# Patient Record
Sex: Female | Born: 1960 | Race: Black or African American | Hispanic: No | Marital: Single | Smoking: Never smoker
Health system: Southern US, Community
[De-identification: ages and names within clinical notes are randomized; demographics above are authoritative.]

## PROBLEM LIST (undated history)

## (undated) ENCOUNTER — Emergency Department (HOSPITAL_COMMUNITY): Payer: 59

## (undated) ENCOUNTER — Emergency Department (HOSPITAL_COMMUNITY): Payer: Self-pay | Source: Home / Self Care

## (undated) DIAGNOSIS — T7840XA Allergy, unspecified, initial encounter: Secondary | ICD-10-CM

## (undated) DIAGNOSIS — I10 Essential (primary) hypertension: Secondary | ICD-10-CM

## (undated) DIAGNOSIS — E785 Hyperlipidemia, unspecified: Secondary | ICD-10-CM

## (undated) DIAGNOSIS — E119 Type 2 diabetes mellitus without complications: Secondary | ICD-10-CM

## (undated) DIAGNOSIS — Z862 Personal history of diseases of the blood and blood-forming organs and certain disorders involving the immune mechanism: Secondary | ICD-10-CM

## (undated) DIAGNOSIS — K219 Gastro-esophageal reflux disease without esophagitis: Secondary | ICD-10-CM

## (undated) DIAGNOSIS — N189 Chronic kidney disease, unspecified: Secondary | ICD-10-CM

## (undated) DIAGNOSIS — D631 Anemia in chronic kidney disease: Secondary | ICD-10-CM

## (undated) HISTORY — DX: Anemia in chronic kidney disease: N18.9

## (undated) HISTORY — DX: Personal history of diseases of the blood and blood-forming organs and certain disorders involving the immune mechanism: Z86.2

## (undated) HISTORY — DX: Allergy, unspecified, initial encounter: T78.40XA

## (undated) HISTORY — DX: Anemia in chronic kidney disease: D63.1

## (undated) HISTORY — DX: Hyperlipidemia, unspecified: E78.5

## (undated) HISTORY — DX: Essential (primary) hypertension: I10

## (undated) HISTORY — DX: Type 2 diabetes mellitus without complications: E11.9

---

## 2000-08-25 ENCOUNTER — Inpatient Hospital Stay (HOSPITAL_COMMUNITY): Admission: AD | Admit: 2000-08-25 | Discharge: 2000-08-25 | Payer: Self-pay | Admitting: *Deleted

## 2000-09-04 ENCOUNTER — Other Ambulatory Visit: Admission: RE | Admit: 2000-09-04 | Discharge: 2000-09-04 | Payer: Self-pay | Admitting: Obstetrics and Gynecology

## 2000-09-04 ENCOUNTER — Ambulatory Visit (HOSPITAL_COMMUNITY): Admission: RE | Admit: 2000-09-04 | Discharge: 2000-09-04 | Payer: Self-pay | Admitting: Obstetrics and Gynecology

## 2000-09-04 ENCOUNTER — Encounter: Payer: Self-pay | Admitting: Obstetrics and Gynecology

## 2000-10-20 ENCOUNTER — Inpatient Hospital Stay (HOSPITAL_COMMUNITY): Admission: AD | Admit: 2000-10-20 | Discharge: 2000-10-20 | Payer: Self-pay | Admitting: Obstetrics and Gynecology

## 2000-11-09 ENCOUNTER — Ambulatory Visit (HOSPITAL_COMMUNITY): Admission: RE | Admit: 2000-11-09 | Discharge: 2000-11-09 | Payer: Self-pay | Admitting: Obstetrics and Gynecology

## 2000-11-09 ENCOUNTER — Encounter: Payer: Self-pay | Admitting: Obstetrics and Gynecology

## 2000-11-30 ENCOUNTER — Inpatient Hospital Stay (HOSPITAL_COMMUNITY): Admission: RE | Admit: 2000-11-30 | Discharge: 2000-12-03 | Payer: Self-pay | Admitting: Obstetrics and Gynecology

## 2000-11-30 ENCOUNTER — Encounter: Payer: Self-pay | Admitting: Obstetrics and Gynecology

## 2001-09-06 ENCOUNTER — Other Ambulatory Visit: Admission: RE | Admit: 2001-09-06 | Discharge: 2001-09-06 | Payer: Self-pay | Admitting: Obstetrics and Gynecology

## 2003-04-17 ENCOUNTER — Other Ambulatory Visit: Admission: RE | Admit: 2003-04-17 | Discharge: 2003-04-17 | Payer: Self-pay | Admitting: Obstetrics and Gynecology

## 2003-04-17 ENCOUNTER — Ambulatory Visit (HOSPITAL_COMMUNITY): Admission: RE | Admit: 2003-04-17 | Discharge: 2003-04-17 | Payer: Self-pay | Admitting: Obstetrics and Gynecology

## 2003-04-17 ENCOUNTER — Encounter: Payer: Self-pay | Admitting: Obstetrics and Gynecology

## 2014-07-31 ENCOUNTER — Ambulatory Visit (INDEPENDENT_AMBULATORY_CARE_PROVIDER_SITE_OTHER): Payer: Managed Care, Other (non HMO)

## 2014-07-31 ENCOUNTER — Encounter: Payer: Self-pay | Admitting: Family Medicine

## 2014-07-31 ENCOUNTER — Ambulatory Visit (INDEPENDENT_AMBULATORY_CARE_PROVIDER_SITE_OTHER): Payer: Managed Care, Other (non HMO) | Admitting: Family Medicine

## 2014-07-31 VITALS — BP 160/90 | HR 84 | Temp 98.1°F | Resp 18 | Ht 69.5 in | Wt 363.2 lb

## 2014-07-31 DIAGNOSIS — E119 Type 2 diabetes mellitus without complications: Secondary | ICD-10-CM

## 2014-07-31 DIAGNOSIS — S8002XA Contusion of left knee, initial encounter: Secondary | ICD-10-CM

## 2014-07-31 DIAGNOSIS — E669 Obesity, unspecified: Secondary | ICD-10-CM

## 2014-07-31 DIAGNOSIS — I10 Essential (primary) hypertension: Secondary | ICD-10-CM | POA: Insufficient documentation

## 2014-07-31 DIAGNOSIS — S8001XA Contusion of right knee, initial encounter: Secondary | ICD-10-CM

## 2014-07-31 DIAGNOSIS — S40021A Contusion of right upper arm, initial encounter: Secondary | ICD-10-CM

## 2014-07-31 DIAGNOSIS — E1169 Type 2 diabetes mellitus with other specified complication: Secondary | ICD-10-CM | POA: Insufficient documentation

## 2014-07-31 MED ORDER — TRAMADOL HCL 50 MG PO TABS: 50.0000 mg | ORAL_TABLET | Freq: Three times a day (TID) | ORAL | Status: DC | PRN

## 2014-07-31 NOTE — Progress Notes (Signed)
[facility] [address] [place] [address] [phone]  Date:  07/31/2014   Name:  Erin Davila   DOB:  1961/03/12   MRN:  [id number]  PCP:  No primary care provider on file.    Chief Complaint: Fall   History of Present Illness:  Erin Davila is a 54 y.o. very pleasant female patient who presents with the following:  Here today as a new patient- she fell on some steps this am in the icy weather.  She fell down onto her knees, and hit her right forearm she thinks on a railing.  She also "grazed' her head on the side of the house.  She fell down just one step. No LOC, she does not have a headache. Her main issue is knee pain- right more than left.    She has a history of DM and HTN.  Her PCP is Dr. Cleda Daub.   She took 800 mg of ibuprofen- this helped her just somewhat  There are no active problems to display for this patient.   Past Medical History  Diagnosis Date  . Allergy   . Diabetes mellitus without complication   . Hypertension     Past Surgical History  Procedure Laterality Date  . Cesarean section      History  Substance Use Topics  . Smoking status: Never Smoker   . Smokeless tobacco: Not on file  . Alcohol Use: No    History reviewed. No pertinent family history.  Allergies  Allergen Reactions  . Milk-Related Compounds     Lactose    Medication list has been reviewed and updated.  No current outpatient prescriptions on file prior to visit.   No current facility-administered medications on file prior to visit.    Review of Systems:  As per HPI- otherwise negative.   Physical Examination: Filed Vitals:   07/31/14 1256  BP: 160/90  Pulse: 84  Temp: 98.1 F (36.7 C)  Resp: 18   Filed Vitals:   07/31/14 1256  Height: 5' 9.5" (1.765 m)  Weight: 363 lb 3.2 oz (164.746 kg)   Body mass index is 52.88 kg/(m^2). Ideal Body Weight: Weight in (lb) to have BMI = 25: 171.4  GEN: WDWN, NAD, Non-toxic, A &  O x , morbidly obese lady There is no evidence of trauma to her head- no hematoma, no abrasion, no swelling, no tenderness She denies any headache HEENT: Atraumatic, Normocephalic. Neck supple. No masses, No LAD. Ears and Nose: No external deformity. CV: RRR, No M/G/R. No JVD. No thrill. No extra heart sounds. PULM: CTA B, no wheezes, crackles, rhonchi. No retractions. No resp. distress. No accessory muscle use.Marland Kitchen EXTR: No c/c/e NEURO walks slowly and favors right minimally  PSYCH: Normally interactive. Conversant. Not depressed or anxious appearing.  Calm demeanor.  Mild tenderness over dorsal right forearm proximal to wrist Right knee: mild tenderness with ROM, no effusion, no swelling, no heat.  Cannot localize tenderness on exam Left knee: minimal tenderness, no effusion, normal ROM  [facility] reading (PRIMARY) by  Dr. Patsy Lager. Right forearm: negative Right knee:negative except for degenerative change  Left knee: negative except for degenerative change  CLINICAL DATA: Status post fall this morning on icy steps. Right forearm pain. Initial encounter.  EXAM: RIGHT FOREARM - 2 VIEW  COMPARISON: None.  FINDINGS: Imaged bones, joints and soft tissues appear normal.  IMPRESSION: Negative exam.  LEFT KNEE - COMPLETE 4+ VIEW  COMPARISON: None.  FINDINGS:  Tricompartment degenerative change. No fracture or dislocation. No acute abnormality.  IMPRESSION: Tricompartment degenerative change. No acute abnormality.  RIGHT KNEE - COMPLETE 4+ VIEW  COMPARISON: None  FINDINGS: The bones of the right knee are adequately mineralized. There is beaking of the tibial spines. The tibial plateaus are intact. The proximal fibula is unremarkable. 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 There are osteophytes from the articular margins of the patella and the lateral  femoral condyles.  IMPRESSION: There are degenerative changes of the right knee centered on the patellofemoral joint. There is no acute fracture.   Assessment and Plan: Knee contusion, right, initial encounter - Plan: DG Knee Complete 4 Views Right, traMADol (ULTRAM) 50 MG tablet  Contusion of right arm, initial encounter - Plan: DG Forearm Right, traMADol (ULTRAM) 50 MG tablet  Knee contusion, left, initial encounter - Plan: DG Knee Complete 4 Views Left, traMADol (ULTRAM) 50 MG tablet  Diabetes mellitus type 2 in obese  Essential hypertension  Morbid obesity  Here today after a fall with injury to her right wrist, and both knees.  No apparent fracture or other more serious injury.  Her weight likley contributes to chronic degeneraive changes of her knees.  Encouraged ice, elevation and ace wrap for support of her knee as needed   She will follow-up if not feeling better soon.  Tramadol as needed for more severe pain.  Given crutches to use as needed   Signed Abbe AmsterdamJessica Copland, MD

## 2014-07-31 NOTE — Patient Instructions (Signed)
Elevated and ice your knee, and use your crutches as needed.  You can also try a walker or cane if easier for you Use the tramadol as needed for pain but remember it can make you sleepy- do not use when you need to drive Let me know if you do not feel better in the next few days- Sooner if worse.

## 2014-09-14 ENCOUNTER — Other Ambulatory Visit: Payer: Self-pay | Admitting: Family Medicine

## 2014-09-14 ENCOUNTER — Ambulatory Visit (INDEPENDENT_AMBULATORY_CARE_PROVIDER_SITE_OTHER): Payer: Managed Care, Other (non HMO)

## 2014-09-14 ENCOUNTER — Ambulatory Visit (INDEPENDENT_AMBULATORY_CARE_PROVIDER_SITE_OTHER): Payer: Managed Care, Other (non HMO) | Admitting: Family Medicine

## 2014-09-14 VITALS — BP 142/84 | HR 90 | Temp 97.9°F | Resp 18 | Ht 69.0 in | Wt 356.0 lb

## 2014-09-14 DIAGNOSIS — M25551 Pain in right hip: Secondary | ICD-10-CM

## 2014-09-14 DIAGNOSIS — M549 Dorsalgia, unspecified: Secondary | ICD-10-CM | POA: Diagnosis not present

## 2014-09-14 DIAGNOSIS — M25552 Pain in left hip: Secondary | ICD-10-CM | POA: Diagnosis not present

## 2014-09-14 DIAGNOSIS — M7062 Trochanteric bursitis, left hip: Secondary | ICD-10-CM

## 2014-09-14 DIAGNOSIS — M7061 Trochanteric bursitis, right hip: Secondary | ICD-10-CM | POA: Diagnosis not present

## 2014-09-14 MED ORDER — DICLOFENAC SODIUM 75 MG PO TBEC: 75.0000 mg | DELAYED_RELEASE_TABLET | Freq: Two times a day (BID) | ORAL | Status: DC

## 2014-09-14 NOTE — Patient Instructions (Signed)
Return Monday evening or Friday of next week if you have been helped by the injection.  Apply ice for about 10-15 minutes a couple of times tonight.  Try and resume the best exercise that you can  Your commended on weight loss, and urged to continue keeping that is a top part to focus  Take diclofenac one twice daily for pain and inflammation. Take with food.

## 2014-09-14 NOTE — Progress Notes (Signed)
Subjective: Patient is here for follow-up with regard to her fall that she had about 5-6 weeks ago. She was evaluated her for her injury to her hips and hands. She did not have her hips x-ray that time, but they've continued to hurt her. The low back hurts some. She has some pains in the legs. She is diabetic  Objective: Morbidly obese lady. She is tender to palpation deep in both hips. Both greater trochanteric areas are tender. Impossible her through a lot of range of motion.  [facility] reading (PRIMARY) by  Dr. Alwyn RenHopper No obvious bony injury. Minimal arthritic changes of the hips and spine..  Assessment: Bilateral hip pain Bilateral greater trochanteric bursal pain Low back pain Diabetes  Plan:  Told the patient this this may just take time to resolve. I can try and inject the greater trochanteric bursa, however she is a very big lady and not easy to hit the target. She is also diabetic but her sugars have been well controlled.. She would like to go ahead. I told her I will just do one today and have her come back to the other one if necessary.  Procedure note Using sterile technique with the patient laying on left side the right greater trochanteric bursa area of maximum pain was isolated. It was prepped and ethyl chloride spray used for anesthesia. The bursa was injected without difficulty. Patient tolerated the procedure well. Was instructed to use some ice on it. If that helps, we will try the other side. If it doesn't help, she will just have to give the pain more time.  Urged her to try and get regular exercise  Try diclofenac 75 mg twice daily  Return as needed or return next week for reinjection of the other side.

## 2014-09-28 ENCOUNTER — Ambulatory Visit (INDEPENDENT_AMBULATORY_CARE_PROVIDER_SITE_OTHER): Payer: Managed Care, Other (non HMO) | Admitting: Family Medicine

## 2014-09-28 VITALS — BP 124/86 | HR 82 | Temp 98.3°F | Resp 16 | Ht 67.5 in | Wt 352.6 lb

## 2014-09-28 DIAGNOSIS — M25551 Pain in right hip: Secondary | ICD-10-CM | POA: Diagnosis not present

## 2014-09-28 DIAGNOSIS — M549 Dorsalgia, unspecified: Secondary | ICD-10-CM | POA: Diagnosis not present

## 2014-09-28 DIAGNOSIS — M7062 Trochanteric bursitis, left hip: Secondary | ICD-10-CM

## 2014-09-28 DIAGNOSIS — M25552 Pain in left hip: Secondary | ICD-10-CM

## 2014-09-28 MED ORDER — DICLOFENAC SODIUM 75 MG PO TBEC: 75.0000 mg | DELAYED_RELEASE_TABLET | Freq: Two times a day (BID) | ORAL | Status: DC

## 2014-09-28 MED ORDER — METHYLPREDNISOLONE ACETATE 80 MG/ML IJ SUSP
80.0000 mg | Freq: Once | INTRAMUSCULAR | Status: AC
  Administered 2014-09-28: 80 mg via INTRA_ARTICULAR

## 2014-09-28 NOTE — Progress Notes (Signed)
Subjective: Patient is feeling better. Her back is feeling better. The left hip is considerably better. She shifted weight after the injection and using the right hip were for a few days and it has continued to hurt, though it probably has improved a little bit by now. The diclofenac has helped her considerably. I told her we cannot keep on that long-term. She would like me to go ahead andinject the right hip.I note that I failed to put down the milligrams of the Depo-Medrol used in the other hip. It was 40 mg of Depo-Medrol with 1 mL of 2% lidocaine.  Objective: Very tender over the greater trochanteric bursa  Assessment: Greater trochanter bursitis on right Back pain improved Left hip improved  Plan: We will inject the right hip for her  Procedure note: Using sterile technique the area was prepped. Skin was anesthetized with ethyl chloride spray. Using a 1-1/2 inch needle, which had to be pressed into the bevel, 40 mg (1/2 mL of 80 mg per mL Depo-Medrol) Depo-Medrol was injected into the area of most pain at the greater trochanteric bursa. Patient tolerated the injection well.  See instructions. Return as needed. Patient was very pleased with previous results and gland were injected her today.

## 2014-09-28 NOTE — Addendum Note (Signed)
Addended byLevon Hedger: CLINE, REBEKAH A on: 09/28/2014 03:52 PM   Modules accepted: Orders

## 2014-09-28 NOTE — Patient Instructions (Signed)
Take the anti-inflammatory, diclofenac, one twice daily for 1 more week. Then just save them and see how you do on an as-needed basis. I cannot continue this medicine long term with you since you are diabetic.  Return on an as-needed basis

## 2014-11-23 ENCOUNTER — Other Ambulatory Visit: Payer: Self-pay | Admitting: Family Medicine

## 2015-02-01 ENCOUNTER — Ambulatory Visit (INDEPENDENT_AMBULATORY_CARE_PROVIDER_SITE_OTHER): Payer: Managed Care, Other (non HMO) | Admitting: Family Medicine

## 2015-02-01 VITALS — BP 114/80 | HR 86 | Temp 98.0°F | Resp 16 | Ht 69.0 in | Wt 355.0 lb

## 2015-02-01 DIAGNOSIS — M7062 Trochanteric bursitis, left hip: Secondary | ICD-10-CM | POA: Diagnosis not present

## 2015-02-01 DIAGNOSIS — E669 Obesity, unspecified: Secondary | ICD-10-CM

## 2015-02-01 DIAGNOSIS — M25559 Pain in unspecified hip: Secondary | ICD-10-CM | POA: Diagnosis not present

## 2015-02-01 DIAGNOSIS — E1169 Type 2 diabetes mellitus with other specified complication: Secondary | ICD-10-CM

## 2015-02-01 DIAGNOSIS — E119 Type 2 diabetes mellitus without complications: Secondary | ICD-10-CM | POA: Diagnosis not present

## 2015-02-01 DIAGNOSIS — M7061 Trochanteric bursitis, right hip: Secondary | ICD-10-CM

## 2015-02-01 LAB — GLUCOSE, POCT (MANUAL RESULT ENTRY): POC GLUCOSE: 82 mg/dL (ref 70–99)

## 2015-02-01 LAB — POCT GLYCOSYLATED HEMOGLOBIN (HGB A1C): Hemoglobin A1C: 6.1

## 2015-02-01 MED ORDER — METHYLPREDNISOLONE ACETATE 80 MG/ML IJ SUSP: 40.0000 mg | Freq: Once | INTRAMUSCULAR | Status: DC

## 2015-02-01 MED ORDER — DICLOFENAC SODIUM 75 MG PO TBEC: DELAYED_RELEASE_TABLET | ORAL | Status: DC

## 2015-02-01 NOTE — Progress Notes (Signed)
Subjective:    Patient ID: Erin Davila, female    DOB: [birth year], 54 y.o.   MRN: [id number]  Chief Complaint  Patient presents with  . corizone shot    both hips  . Medication Refill    Diclofenac Sodium   Patient Active Problem List   Diagnosis Date Noted  . Trochanteric bursitis of both hips 02/01/2015  . Diabetes mellitus type 2 in obese 07/31/2014  . HTN (hypertension) 07/31/2014  . Morbid obesity 07/31/2014   Prior to Admission medications   Medication Sig Start Date End Date Taking? Authorizing Provider  diclofenac (VOLTAREN) 75 MG EC tablet TAKE 1 TABLET (75 MG TOTAL) BY MOUTH 2 (TWO) TIMES DAILY. 11/28/14  Yes Peyton Najjar, MD  insulin detemir (LEVEMIR) 100 UNIT/ML injection Inject into the skin at bedtime.   Yes Historical Provider, MD  METFORMIN HCL PO Take by mouth.   Yes Historical Provider, MD  olmesartan-hydrochlorothiazide (BENICAR HCT) 40-25 MG per tablet Take 1 tablet by mouth daily.   Yes Historical Provider, MD  SIMVASTATIN PO Take by mouth.   Yes Historical Provider, MD  IBUPROFEN PO Take by mouth.    Historical Provider, MD  meloxicam (MOBIC) 15 MG tablet Take 15 mg by mouth daily.    Historical Provider, MD  montelukast (SINGULAIR) 10 MG tablet Take 10 mg by mouth at bedtime.    Historical Provider, MD  traMADol (ULTRAM) 50 MG tablet Take 1 tablet (50 mg total) by mouth every 8 (eight) hours as needed. Patient not taking: Reported on 09/14/2014 07/31/14   [name], MD   Medications, allergies, past medical history, surgical history, family history, social history and problem list reviewed and updated.  HPI  19 yof presents with bilateral hip pain wanting repeat hip injections.   Diagnosed with bilateral trochanteric bursitis months ago. Received steroid injection in left hip March 10th and right hip March 24th. This worked wonders to relieve her pain for several months. Starting few wks ago pain has returned bilateral hips with activity, even  sometimes at rest. Has never seen an ortho doc for this.   Not exercising. Active every day with chores/work.   Has dm2. Unsure of recent bg readings or a1c.   Review of Systems     Objective:   Physical Exam  Constitutional: She is oriented to person, place, and time. She appears well-developed and well-nourished.  Non-toxic appearance. She does not have a sickly appearance. She does not appear ill. No distress.  BP 114/80 mmHg  Pulse 86  Temp(Src) 98 F (36.7 C) (Oral)  Resp 16  Ht 5\' 9"  (1.753 m)  Wt 355 lb (161.027 kg)  BMI 52.40 kg/m2  SpO2 99%  LMP 11/03/2014   Musculoskeletal:       Right hip: She exhibits decreased range of motion.       Left hip: She exhibits decreased range of motion.  Neurological: She is alert and oriented to person, place, and time.  Psychiatric: She has a normal mood and affect. Her speech is normal and behavior is normal.   Results for orders placed or performed in visit on 02/01/15  POCT glucose (manual entry)  Result Value Ref Range   POC Glucose 82 70 - 99 mg/dl  POCT glycosylated hemoglobin (Hb A1C)  Result Value Ref Range   Hemoglobin A1C 6.1       Assessment & Plan:   Trochanteric bursitis of both hips --  Diabetes mellitus type 2 in obese -  Plan: POCT glucose (manual entry), POCT glycosylated hemoglobin (Hb A1C) --well controlled on current regimen  Donnajean Lopes, PA-C Physician Assistant-Certified [facility]  02/01/2015 3:18 PM

## 2015-02-01 NOTE — Progress Notes (Signed)
Bilateral trochanteric bursa injection with 1cc 2% plain lido with 40 ml (0.5cc) depomedrol in each hip. Areas palpated and marked. Areas prepped with betadine. Injections made without complication. Areas covered with bandaid. Pt tolerated procedure well.

## 2015-02-01 NOTE — Progress Notes (Signed)
She was seen and examined by me. We did discuss her diabetes in the setting of bilateral trochanteric bursitis and steroid injections. Monitor her sugar level and will call me in the morning if it is severely elevated. She is currently on metformin twice a day and also Lantus 50 units at night.

## 2015-02-01 NOTE — Patient Instructions (Signed)
Hip Bursitis Bursitis is a swelling and soreness (inflammation) of a fluid-filled sac (bursa). This sac overlies and protects the joints.  CAUSES   Injury.  Overuse of the muscles surrounding the joint.  Arthritis.  Gout.  Infection.  Cold weather.  Inadequate warm-up and conditioning prior to activities. The cause may not be known.  SYMPTOMS   Mild to severe irritation.  Tenderness and swelling over the outside of the hip.  Pain with motion of the hip.  If the bursa becomes infected, a fever may be present. Redness, tenderness, and warmth will develop over the hip. Symptoms usually lessen in 3 to 4 weeks with treatment, but can come back. TREATMENT If conservative treatment does not work, your caregiver may advise draining the bursa and injecting cortisone into the area. This may speed up the healing process. This may also be used as an initial treatment of choice. HOME CARE INSTRUCTIONS   Apply ice to the affected area for 15-20 minutes every 3 to 4 hours while awake for the first 2 days. Put the ice in a plastic bag and place a towel between the bag of ice and your skin.  Rest the painful joint as much as possible, but continue to put the joint through a normal range of motion at least 4 times per day. When the pain lessens, begin normal, slow movements and usual activities to help prevent stiffness of the hip.  Only take over-the-counter or prescription medicines for pain, discomfort, or fever as directed by your caregiver.  Use crutches to limit weight bearing on the hip joint, if advised.  Elevate your painful hip to reduce swelling. Use pillows for propping and cushioning your legs and hips.  Gentle massage may provide comfort and decrease swelling. SEEK IMMEDIATE MEDICAL CARE IF:   Your pain increases even during treatment, or you are not improving.  You have a fever.  You have heat and inflammation over the involved bursa.  You have any other questions or  concerns. MAKE SURE YOU:   Understand these instructions.  Will watch your condition.  Will get help right away if you are not doing well or get worse. Document Released: 12/13/2001 Document Revised: 09/15/2011 Document Reviewed: 07/12/2008 [facility] Patient Information 2015 [place], [place]. This information is not intended to replace advice given to you by your health care provider. Make sure you discuss any questions you have with your health care provider. Diabetes and Standards of Medical Care Diabetes is complicated. You may find that your diabetes team includes a dietitian, nurse, diabetes educator, eye doctor, and more. To help everyone know what is going on and to help you get the care you deserve, the following schedule of care was developed to help keep you on track. Below are the tests, exams, vaccines, medicines, education, and plans you will need. HbA1c test This test shows how well you have controlled your glucose over the past 2-3 months. It is used to see if your diabetes management plan needs to be adjusted.   It is performed at least 2 times a year if you are meeting treatment goals.  It is performed 4 times a year if therapy has changed or if you are not meeting treatment goals. Blood pressure test  This test is performed at every routine medical visit. The goal is less than 140/90 mm Hg for most people, but 130/80 mm Hg in some cases. Ask your health care provider about your goal. Dental exam  Follow up with the dentist regularly.  Eye exam  If you are diagnosed with type 1 diabetes as a child, get an exam upon reaching the age of 107 years or older and have had diabetes for 3-5 years. Yearly eye exams are recommended after that initial eye exam.  If you are diagnosed with type 1 diabetes as an adult, get an exam within 5 years of diagnosis and then yearly.  If you are diagnosed with type 2 diabetes, get an exam as soon as possible after the diagnosis and then  yearly. Foot care exam  Visual foot exams are performed at every routine medical visit. The exams check for cuts, injuries, or other problems with the feet.  A comprehensive foot exam should be done yearly. This includes visual inspection as well as assessing foot pulses and testing for loss of sensation.  Check your feet nightly for cuts, injuries, or other problems with your feet. Tell your health care provider if anything is not healing. Kidney function test (urine microalbumin)  This test is performed once a year.  Type 1 diabetes: The first test is performed 5 years after diagnosis.  Type 2 diabetes: The first test is performed at the time of diagnosis.  A serum creatinine and estimated glomerular filtration rate (eGFR) test is done once a year to assess the level of chronic kidney disease (CKD), if present. Lipid profile (cholesterol, HDL, LDL, triglycerides)  Performed every 5 years for most people.  The goal for LDL is less than 100 mg/dL. If you are at high risk, the goal is less than 70 mg/dL.  The goal for HDL is 40 mg/dL-50 mg/dL for men and 50 mg/dL-60 mg/dL for women. An HDL cholesterol of 60 mg/dL or higher gives some protection against heart disease.  The goal for triglycerides is less than 150 mg/dL. Influenza vaccine, pneumococcal vaccine, and hepatitis B vaccine  The influenza vaccine is recommended yearly.  It is recommended that people with diabetes who are over 81 years old get the pneumonia vaccine. In some cases, two separate shots may be given. Ask your health care provider if your pneumonia vaccination is up to date.  The hepatitis B vaccine is also recommended for adults with diabetes. Diabetes self-management education  Education is recommended at diagnosis and ongoing as needed. Treatment plan  Your treatment plan is reviewed at every medical visit. Document Released: 04/20/2009 Document Revised: 11/07/2013 Document Reviewed: 11/23/2012 [employer] [facility] [employer]  Patient Information 2015 [place], [place]. This information is not intended to replace advice given to you by your health care provider. Make sure you discuss any questions you have with your health care provider.

## 2015-03-27 ENCOUNTER — Ambulatory Visit (INDEPENDENT_AMBULATORY_CARE_PROVIDER_SITE_OTHER): Payer: Managed Care, Other (non HMO) | Admitting: Family Medicine

## 2015-03-27 ENCOUNTER — Ambulatory Visit (INDEPENDENT_AMBULATORY_CARE_PROVIDER_SITE_OTHER): Payer: Managed Care, Other (non HMO)

## 2015-03-27 VITALS — BP 132/96 | HR 87 | Temp 98.0°F | Resp 16 | Ht 68.5 in | Wt 350.0 lb

## 2015-03-27 DIAGNOSIS — Y92099 Unspecified place in other non-institutional residence as the place of occurrence of the external cause: Secondary | ICD-10-CM

## 2015-03-27 DIAGNOSIS — M25562 Pain in left knee: Secondary | ICD-10-CM

## 2015-03-27 DIAGNOSIS — M25552 Pain in left hip: Secondary | ICD-10-CM | POA: Diagnosis not present

## 2015-03-27 DIAGNOSIS — W19XXXA Unspecified fall, initial encounter: Secondary | ICD-10-CM

## 2015-03-27 DIAGNOSIS — M25561 Pain in right knee: Secondary | ICD-10-CM

## 2015-03-27 DIAGNOSIS — Y92009 Unspecified place in unspecified non-institutional (private) residence as the place of occurrence of the external cause: Secondary | ICD-10-CM

## 2015-03-27 MED ORDER — TRAMADOL HCL 50 MG PO TABS: 50.0000 mg | ORAL_TABLET | Freq: Three times a day (TID) | ORAL | Status: DC | PRN

## 2015-03-27 MED ORDER — KETOROLAC TROMETHAMINE 60 MG/2ML IM SOLN
60.0000 mg | Freq: Once | INTRAMUSCULAR | Status: AC
  Administered 2015-03-27: 60 mg via INTRAMUSCULAR

## 2015-03-27 NOTE — Progress Notes (Signed)
Patient ID: Erin Davila, female    DOB: 1960-10-04  Age: 54 y.o. MRN: [id number]  Chief Complaint  Patient presents with  . Knee Pain    both knees, Pt. fell last night  . Joint Swelling    left knee/ x last night    Subjective:   Patient returned to say something to her son yesterday as they were leaving the house, and she felt down 3 steps to the concrete. She twisted and landed on her left side. She hurt both knees. Has a little abrasion of her left elbow. A little abrasion on her finger. And some pain in her right shoulder. She has some chronic arthritic pains in her knees anyhow. The knee just aches even when she is not on it or moving it., Especially the left one. The left foot is swelling some. She did take diclofenac last night and again this morning.  Current allergies, medications, problem list, past/family and social histories reviewed.  Objective:  BP 132/96 mmHg  Pulse 87  Temp(Src) 98 F (36.7 C) (Oral)  Resp 16  Ht 5' 8.5" (1.74 m)  Wt 350 lb (158.759 kg)  BMI 52.44 kg/m2  SpO2 99%  LMP 03/26/2015  Full range of motion of the shoulder on the right. It is generally mildly tender. The left buttock and hip are paining her, but no bruises are visible. She has some generalized tenderness in the gluteus. The left knee is swollen over the patellar area. The joint itself does not seem swollen. There is no major crepitance but there is a distinct pop when STRESSING the knee. It felt a little bit better after that pop. Still hurts, but was suspicious for something going back into place. She has no swelling of the right knee but it too is tender mildly. Gait hurts both knees. She is very unsteady. She hurts in her hip also.  [facility] reading (PRIMARY) by  Dr. 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 No acute bony injury noted. Hip also has some arthritic changes but no acute abnormality..   Assessment & Plan:   Assessment: 1. Bilateral knee pain   2. Acute hip pain, left    3. Fall at home, initial encounter       Plan: Orders Placed This Encounter  Procedures  . DG HIP UNILAT W OR W/O PELVIS 2-3 VIEWS LEFT    Standing Status: Future     Number of Occurrences: 1     Standing Expiration Date: 03/26/2016    Order Specific Question:  Reason for Exam (SYMPTOM  OR DIAGNOSIS REQUIRED)    Answer:  fell, pain    Order Specific Question:  Is the patient pregnant?    Answer:  No    Order Specific Question:  Preferred imaging location?    Answer:  External  . DG Knee 1-2 Views Left    Standing Status: Future     Number of Occurrences: 1     Standing Expiration Date: 03/26/2016    Order Specific Question:  Reason for Exam (SYMPTOM  OR DIAGNOSIS REQUIRED)    Answer:  fell, pain    Order Specific Question:  Is the patient pregnant?    Answer:  No    Order Specific Question:  Preferred imaging location?    Answer:  External  . DG Knee 1-2 Views Right    Standing Status: Future     Number of Occurrences: 1     Standing Expiration Date: 03/26/2016  Order Specific Question:  Reason for Exam (SYMPTOM  OR DIAGNOSIS REQUIRED)    Answer:  fell, pain    Order Specific Question:  Is the patient pregnant?    Answer:  No    Order Specific Question:  Preferred imaging location?    Answer:  External    Meds ordered this encounter  Medications  . ketorolac (TORADOL) injection 60 mg    Sig:    Will treat as an acute problem. If pain persists may need more care. 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   Patient Instructions  Take the diclofenac one twice daily for pain and inflammation  Use cane as necessary  Get a temporary handicap parking sticker  Take tramadol every 4-6 hours as needed for additional pain relief  Return if not improving over the next couple of weeks.  Wear the knee brace and use your cane until you're doing better     Return if symptoms worsen or fail to improve.   HOPPER,DAVID, MD  03/27/2015

## 2015-03-27 NOTE — Addendum Note (Signed)
Addended by: HOPPER, DAVID H on: 03/27/2015 04:15 PM   Modules accepted: Orders

## 2015-03-27 NOTE — Patient Instructions (Addendum)
Take the diclofenac one twice daily for pain and inflammation  Use cane as necessary  Get a temporary handicap parking sticker  Take tramadol every 4-6 hours as needed for additional pain relief  Return if not improving over the next couple of weeks.  Wear the knee brace and use your cane until you're doing better

## 2015-03-30 ENCOUNTER — Encounter: Payer: Self-pay | Admitting: Family Medicine

## 2015-04-06 ENCOUNTER — Other Ambulatory Visit: Payer: Self-pay | Admitting: Family Medicine

## 2015-04-06 DIAGNOSIS — M7061 Trochanteric bursitis, right hip: Secondary | ICD-10-CM

## 2015-04-06 DIAGNOSIS — M7062 Trochanteric bursitis, left hip: Secondary | ICD-10-CM

## 2015-04-06 DIAGNOSIS — M25562 Pain in left knee: Principal | ICD-10-CM

## 2015-04-06 DIAGNOSIS — M25561 Pain in right knee: Secondary | ICD-10-CM

## 2015-04-06 DIAGNOSIS — M25559 Pain in unspecified hip: Secondary | ICD-10-CM

## 2015-04-06 DIAGNOSIS — M25552 Pain in left hip: Secondary | ICD-10-CM

## 2015-04-08 NOTE — Telephone Encounter (Signed)
Call: Will review once, then needs to see someone before any more refills.  Do not use regularly, only use every 8 hours if pain is moderately severe.  For milder pain take tylenol.  Refill for 30 Tramadol, 1 q8 h prn.

## 2015-04-10 NOTE — Telephone Encounter (Signed)
Lm Did patient get her Tramadol prescription?

## 2015-04-11 NOTE — Telephone Encounter (Signed)
Yes,  But is out and has agreed to come in if she can get one more refill now.

## 2015-04-15 ENCOUNTER — Other Ambulatory Visit: Payer: Self-pay | Admitting: Family Medicine

## 2015-04-19 ENCOUNTER — Telehealth: Payer: Self-pay

## 2015-04-19 MED ORDER — DICLOFENAC SODIUM 75 MG PO TBEC: DELAYED_RELEASE_TABLET | ORAL | Status: DC

## 2015-04-19 MED ORDER — TRAMADOL HCL 50 MG PO TABS: 50.0000 mg | ORAL_TABLET | Freq: Three times a day (TID) | ORAL | Status: DC | PRN

## 2015-04-19 NOTE — Telephone Encounter (Signed)
Duplicate message. 

## 2015-04-19 NOTE — Telephone Encounter (Signed)
Notified pt that we can send in this RF which doesn't look like was ever sent when Dr Alwyn RenHopper approved it. Advised pt of Dr's instr's and she agreed. She also asked for RF of diclofenac, and Dr Alwyn RenHopper had advised her to continue that. I sent RFs in of it, and called in tramadol.

## 2015-10-01 ENCOUNTER — Other Ambulatory Visit: Payer: Self-pay | Admitting: Family Medicine

## 2020-12-24 ENCOUNTER — Other Ambulatory Visit: Payer: Self-pay

## 2020-12-24 ENCOUNTER — Encounter: Payer: Self-pay | Admitting: Emergency Medicine

## 2020-12-24 ENCOUNTER — Ambulatory Visit
Admission: EM | Admit: 2020-12-24 | Discharge: 2020-12-24 | Disposition: A | Discharge status: Home / Self Care | Payer: 59 | Attending: Emergency Medicine | Admitting: Emergency Medicine

## 2020-12-24 DIAGNOSIS — L0231 Cutaneous abscess of buttock: Secondary | ICD-10-CM

## 2020-12-24 MED ORDER — CEFTRIAXONE SODIUM 1 G IJ SOLR
1.0000 g | Freq: Once | INTRAMUSCULAR | Status: AC
  Administered 2020-12-24: 1 g via INTRAMUSCULAR

## 2020-12-24 MED ORDER — DOXYCYCLINE HYCLATE 100 MG PO CAPS: 100.0000 mg | ORAL_CAPSULE | Freq: Two times a day (BID) | ORAL | 0 refills | Status: AC

## 2020-12-24 NOTE — ED Triage Notes (Signed)
Pt here for abscess to left buttocks x 4 days; denies draining but sts hx of similar

## 2020-12-24 NOTE — Discharge Instructions (Signed)
Begin doxycycline twice daily for 10 days Ibuprofen and Tylenol for pain Warm compresses to area Follow-up in 2 days for packing removal/abscess check Follow-up for any concerns

## 2020-12-25 NOTE — ED Provider Notes (Signed)
[name]    CSN: [id number] Arrival date & time: 12/24/20  1948      History   Chief Complaint Chief Complaint  Patient presents with   Abscess    HPI Erin Davila is a 60 y.o. female history of hypertension, DM type II, presenting today for evaluation of buttock abscess.  Over the past 4 days has developed increased pain and swelling to left buttocks.  Has felt warm, no known measured fever at home.  Denies any pus from genital or rectal pain  HPI  Past Medical History:  Diagnosis Date   Allergy    Diabetes mellitus without complication (HCC)    Hyperlipidemia    Hypertension     Patient Active Problem List   Diagnosis Date Noted   Trochanteric bursitis of both hips 02/01/2015   Diabetes mellitus type 2 in obese (HCC) 07/31/2014   HTN (hypertension) 07/31/2014   Morbid obesity (HCC) 07/31/2014    Past Surgical History:  Procedure Laterality Date   CESAREAN SECTION      OB History   No obstetric history on file.      Home Medications    Prior to Admission medications   Medication Sig Start Date End Date Taking? Authorizing Provider  doxycycline (VIBRAMYCIN) 100 MG capsule Take 1 capsule (100 mg total) by mouth 2 (two) times daily for 10 days. 12/24/20 01/03/21 Yes Kalani Baray C, PA-C  diclofenac (VOLTAREN) 75 MG EC tablet TAKE 1 TABLET (75 MG TOTAL) BY MOUTH 2 (TWO) TIMES DAILY. 10/03/15   Peyton Najjar, MD  IBUPROFEN PO Take by mouth.    [provider]  insulin detemir (LEVEMIR) 100 UNIT/ML injection Inject into the skin at bedtime.    [provider]  METFORMIN HCL PO Take by mouth.    [provider]  montelukast (SINGULAIR) 10 MG tablet Take 10 mg by mouth at bedtime.    [provider]  olmesartan-hydrochlorothiazide (BENICAR HCT) 40-25 MG per tablet Take 1 tablet by mouth daily.    [provider]  SIMVASTATIN PO Take by mouth.    [provider]  traMADol (ULTRAM) 50 MG  tablet Take 1 tablet (50 mg total) by mouth every 8 (eight) hours as needed. Patient not taking: Reported on 09/14/2014 07/31/14   Copland, Gwenlyn Found, MD  traMADol (ULTRAM) 50 MG tablet Take 1 tablet (50 mg total) by mouth every 8 (eight) hours as needed. PATIENT NEEDS OFFICE VISIT FOR ADDITIONAL REFILLS 04/19/15   Peyton Najjar, MD    Family History History reviewed. No pertinent family history.  Social History Social History   Tobacco Use   Smoking status: Never  Substance Use Topics   Alcohol use: No   Drug use: No     Allergies   Milk-related compounds   Review of Systems Review of Systems  Constitutional:  Negative for fatigue and fever.  HENT:  Negative for mouth sores.   Eyes:  Negative for visual disturbance.  Respiratory:  Negative for shortness of breath.   Cardiovascular:  Negative for chest pain.  Gastrointestinal:  Negative for abdominal pain, nausea and vomiting.  Genitourinary:  Negative for genital sores.  Musculoskeletal:  Negative for arthralgias and joint swelling.  Skin:  Positive for color change and wound. Negative for rash.  Neurological:  Negative for dizziness, weakness, light-headedness and headaches.    Physical Exam Triage Vital Signs ED Triage Vitals  Enc Vitals Group     BP 12/24/20 2019 (!) 178/74  Pulse Rate 12/24/20 2019 (!) 107     Resp 12/24/20 2019 18     Temp 12/24/20 2019 100.1 F (37.8 C)     Temp Source 12/24/20 2019 Oral     SpO2 12/24/20 2019 94 %     Weight --      Height --      Head Circumference --      Peak Flow --      Pain Score 12/24/20 2021 8     Pain Loc --      Pain Edu? --      Excl. in GC? --    No data found.  Updated Vital Signs BP (!) 178/74   Pulse (!) 107   Temp 100.1 F (37.8 C) (Oral)   Resp 18   LMP 03/26/2015   SpO2 94%   Visual Acuity Right Eye Distance:   Left Eye Distance:   Bilateral Distance:    Right Eye Near:   Left Eye Near:    Bilateral Near:     Physical  Exam Vitals and nursing note reviewed.  Constitutional:      Appearance: She is well-developed.     Comments: No acute distress  HENT:     Head: Normocephalic and atraumatic.     Nose: Nose normal.  Eyes:     Conjunctiva/sclera: Conjunctivae normal.  Cardiovascular:     Rate and Rhythm: Normal rate.  Pulmonary:     Effort: Pulmonary effort is normal. No respiratory distress.  Abdominal:     General: There is no distension.  Musculoskeletal:        General: Normal range of motion.     Cervical back: Neck supple.  Skin:    General: Skin is warm and dry.     Comments: Left buttocks with swelling induration erythema and central skin peeling/thinning, very small area draining purulent drainage  Neurological:     Mental Status: She is alert and oriented to person, place, and time.     UC Treatments / Results  Labs (all labs ordered are listed, but only abnormal results are displayed) Labs Reviewed - No data to display  EKG   Radiology No results found.  Procedures Incision and Drainage  Date/Time: 12/24/2020 8:11 PM Performed by: Charlize Hathaway, [place] C, PA-C Authorized by: Loyda Costin, [place] C, PA-C   Consent:    Consent obtained:  Verbal   Consent given by:  Patient   Risks, benefits, and alternatives were discussed: yes     Risks discussed:  Bleeding, pain and incomplete drainage   Alternatives discussed:  No treatment Universal protocol:    Patient identity confirmed:  Verbally with patient Location:    Type:  Abscess Pre-procedure details:    Skin preparation:  Povidone-iodine Anesthesia:    Anesthesia method:  Local infiltration   Local anesthetic:  Lidocaine 1% w/o epi Procedure type:    Complexity:  Simple Procedure details:    Ultrasound guidance: no     Needle aspiration: no     Incision types:  Single straight   Incision depth:  Subcutaneous   Wound management:  Probed and deloculated   Drainage:  Purulent   Drainage amount:  Copious   Wound treatment:   Wound left open   Packing materials:  1/4 in iodoform gauze Post-procedure details:    Procedure completion:  Tolerated well, no immediate complications (including critical care time)  Medications Ordered in UC Medications  cefTRIAXone (ROCEPHIN) injection 1 g (1 g Intramuscular Given 12/24/20 2046)  Initial Impression / Assessment and Plan / UC Course  I have reviewed the triage vital signs and the nursing notes.  Pertinent labs & imaging results that were available during my care of the patient were reviewed by me and considered in my medical decision making (see chart for details).     Left gluteal abscess-I&D performed, packing placed, patient to follow-up in 2 days for packing removal and abscess check..  Low-grade fever today, in setting of diabetes, providing Rocephin prior to discharge, continuing on doxycycline.  Discussed strict return precautions. Patient verbalized understanding and is agreeable with plan.  Final Clinical Impressions(s) / UC Diagnoses   Final diagnoses:  Left buttock abscess     Discharge Instructions      Begin doxycycline twice daily for 10 days Ibuprofen and Tylenol for pain Warm compresses to area Follow-up in 2 days for packing removal/abscess check Follow-up for any concerns     ED Prescriptions     Medication Sig Dispense Auth. Provider   doxycycline (VIBRAMYCIN) 100 MG capsule Take 1 capsule (100 mg total) by mouth 2 (two) times daily for 10 days. 20 capsule Harvey Lingo, [place] C, PA-C      PDMP not reviewed this encounter.   Lew Dawes, [place] 12/25/20 [phone]

## 2020-12-26 ENCOUNTER — Other Ambulatory Visit: Payer: Self-pay

## 2020-12-26 ENCOUNTER — Encounter: Payer: Self-pay | Admitting: *Deleted

## 2020-12-26 ENCOUNTER — Ambulatory Visit
Admission: EM | Admit: 2020-12-26 | Discharge: 2020-12-26 | Disposition: A | Discharge status: Home / Self Care | Payer: 59

## 2020-12-26 DIAGNOSIS — L0231 Cutaneous abscess of buttock: Secondary | ICD-10-CM

## 2020-12-26 DIAGNOSIS — R030 Elevated blood-pressure reading, without diagnosis of hypertension: Secondary | ICD-10-CM

## 2020-12-26 MED ORDER — KETOROLAC TROMETHAMINE 30 MG/ML IJ SOLN
30.0000 mg | Freq: Once | INTRAMUSCULAR | Status: AC
  Administered 2020-12-26: 30 mg via INTRAMUSCULAR

## 2020-12-26 NOTE — ED Triage Notes (Signed)
C/O abscess to left buttock onset last wk; states had I&D 2 days ago, was told to f/u.  States it feels no better, c/o significant pain. Taking Celebrex, Tyl without any relief.  Denies fevers.

## 2020-12-31 NOTE — ED Provider Notes (Signed)
[facility]    CSN: [id number] Arrival date & time: 12/26/20  1316      History   Chief Complaint Chief Complaint  Patient presents with   Abscess    HPI Erin Davila is a 60 y.o. female.   Presenting today for recheck left buttock abscess that started a week ago. Presented to this UC 2 days ago and had I and D performed and was placed on doxycycline. States overall the area appears to be doing some better, still draining often but smaller in size. Pain is poorly controlled despite OTC pain relievers, warm soaks. Denies fever, chills, sweats, body aches, N/V/D. States she removed her own packing strips yesterday while bathing.    Past Medical History:  Diagnosis Date   Allergy    Diabetes mellitus without complication (HCC)    Hyperlipidemia    Hypertension     Patient Active Problem List   Diagnosis Date Noted   Trochanteric bursitis of both hips 02/01/2015   Diabetes mellitus type 2 in obese (HCC) 07/31/2014   HTN (hypertension) 07/31/2014   Morbid obesity (HCC) 07/31/2014    Past Surgical History:  Procedure Laterality Date   CESAREAN SECTION      OB History   No obstetric history on file.      Home Medications    Prior to Admission medications   Medication Sig Start Date End Date Taking? Authorizing Provider  Celecoxib (CELEBREX PO) Take by mouth.   Yes [provider]  doxycycline (VIBRAMYCIN) 100 MG capsule Take 1 capsule (100 mg total) by mouth 2 (two) times daily for 10 days. 12/24/20 01/03/21 Yes Wieters, Hallie C, PA-C  insulin detemir (LEVEMIR) 100 UNIT/ML injection Inject into the skin at bedtime.   Yes [provider]  METFORMIN HCL PO Take by mouth.   Yes [provider]  montelukast (SINGULAIR) 10 MG tablet Take 10 mg by mouth at bedtime.   Yes [provider]  olmesartan-hydrochlorothiazide (BENICAR HCT) 40-25 MG per tablet Take 1 tablet by mouth daily.   Yes [provider]   SIMVASTATIN PO Take by mouth.   Yes [provider]  diclofenac (VOLTAREN) 75 MG EC tablet TAKE 1 TABLET (75 MG TOTAL) BY MOUTH 2 (TWO) TIMES DAILY. 10/03/15   Peyton Najjar, MD  IBUPROFEN PO Take by mouth.    [provider]  traMADol (ULTRAM) 50 MG tablet Take 1 tablet (50 mg total) by mouth every 8 (eight) hours as needed. Patient not taking: No sig reported 07/31/14   Copland, Gwenlyn Found, MD  traMADol (ULTRAM) 50 MG tablet Take 1 tablet (50 mg total) by mouth every 8 (eight) hours as needed. PATIENT NEEDS OFFICE VISIT FOR ADDITIONAL REFILLS 04/19/15   Peyton Najjar, MD    Family History History reviewed. No pertinent family history.  Social History Social History   Tobacco Use   Smoking status: Never   Smokeless tobacco: Never  Substance Use Topics   Alcohol use: No   Drug use: No     Allergies   Milk-related compounds   Review of Systems Review of Systems PER HPI   Physical Exam Triage Vital Signs ED Triage Vitals  Enc Vitals Group     BP 12/26/20 1428 (!) 186/83     Pulse Rate 12/26/20 1428 85     Resp 12/26/20 1428 (!) 22     Temp 12/26/20 1428 98 F (36.7 C)     Temp Source 12/26/20 1428 Temporal  SpO2 12/26/20 1428 97 %     Weight --      Height --      Head Circumference --      Peak Flow --      Pain Score 12/26/20 1430 10     Pain Loc --      Pain Edu? --      Excl. in GC? --    No data found.  Updated Vital Signs BP (!) 186/83 Comment: states took her HTN med late today  Pulse 85   Temp 98 F (36.7 C) (Temporal)   Resp (!) 22   LMP 03/26/2015   SpO2 97%   Visual Acuity Right Eye Distance:   Left Eye Distance:   Bilateral Distance:    Right Eye Near:   Left Eye Near:    Bilateral Near:     Physical Exam Constitutional:      Appearance: She is obese.     Comments: Appears in significant pain  HENT:     Head: Atraumatic.     Mouth/Throat:     Mouth: Mucous membranes are moist.  Eyes:     Extraocular  Movements: Extraocular movements intact.     Conjunctiva/sclera: Conjunctivae normal.  Cardiovascular:     Rate and Rhythm: Normal rate and regular rhythm.     Heart sounds: Normal heart sounds.  Pulmonary:     Effort: Pulmonary effort is normal. No respiratory distress.  Musculoskeletal:        General: Normal range of motion.     Cervical back: Normal range of motion and neck supple.  Lymphadenopathy:     Cervical: No cervical adenopathy.  Skin:    General: Skin is warm.     Findings: No erythema.     Comments: Open area left superior gluteal cleft with surrounding edema. Malodorous clear/brown discharge when pressure applied to area  Neurological:     Mental Status: She is alert and oriented to person, place, and time.  Psychiatric:        Thought Content: Thought content normal.        Judgment: Judgment normal.     Comments: Anxious, likely secondary to pain     UC Treatments / Results  Labs (all labs ordered are listed, but only abnormal results are displayed) Labs Reviewed - No data to display  EKG   Radiology No results found.  Procedures Procedures (including critical care time)  Medications Ordered in UC Medications  ketorolac (TORADOL) 30 MG/ML injection 30 mg (30 mg Intramuscular Given 12/26/20 1456)    Initial Impression / Assessment and Plan / UC Course  I have reviewed the triage vital signs and the nursing notes.  Pertinent labs & imaging results that were available during my care of the patient were reviewed by me and considered in my medical decision making (see chart for details).     Area appears to be slowly improving, drained as much as able today using pressure to the area, discussed continued soaks, completion of antibiotics, and warm compresses. F/u with [facility] for recheck and ongoing management given size of area. IM toradol given today for her poorly controlled pain. Discussed risks of this, particularly with her BP being  so elevated today which is likely secondary to pain. Continue to check home BPs and f/u with PCP if not improving in next 24 hours.   Final Clinical Impressions(s) / UC Diagnoses   Final diagnoses:  Gluteal abscess  Elevated blood pressure reading  Discharge Instructions   None    ED Prescriptions   None    PDMP not reviewed this encounter.   Roosvelt Maser [place], [place] 12/31/20 [phone]

## 2021-01-03 ENCOUNTER — Other Ambulatory Visit: Payer: Self-pay

## 2021-01-03 ENCOUNTER — Inpatient Hospital Stay (HOSPITAL_COMMUNITY)
Admission: AD | Admit: 2021-01-03 | Discharge: 2021-01-10 | DRG: 853 | Disposition: A | Discharge status: Rehabilitation Facility | Payer: 59 | Attending: Surgery | Admitting: Surgery

## 2021-01-03 ENCOUNTER — Observation Stay (HOSPITAL_COMMUNITY): Payer: 59

## 2021-01-03 ENCOUNTER — Encounter (HOSPITAL_COMMUNITY): Payer: Self-pay | Admitting: Surgery

## 2021-01-03 ENCOUNTER — Other Ambulatory Visit: Payer: Self-pay | Admitting: Student

## 2021-01-03 DIAGNOSIS — E1165 Type 2 diabetes mellitus with hyperglycemia: Secondary | ICD-10-CM | POA: Diagnosis present

## 2021-01-03 DIAGNOSIS — A419 Sepsis, unspecified organism: Principal | ICD-10-CM | POA: Diagnosis present

## 2021-01-03 DIAGNOSIS — R6521 Severe sepsis with septic shock: Secondary | ICD-10-CM | POA: Diagnosis present

## 2021-01-03 DIAGNOSIS — L03317 Cellulitis of buttock: Secondary | ICD-10-CM | POA: Diagnosis present

## 2021-01-03 DIAGNOSIS — Z79899 Other long term (current) drug therapy: Secondary | ICD-10-CM

## 2021-01-03 DIAGNOSIS — Z794 Long term (current) use of insulin: Secondary | ICD-10-CM

## 2021-01-03 DIAGNOSIS — L0231 Cutaneous abscess of buttock: Secondary | ICD-10-CM | POA: Diagnosis present

## 2021-01-03 DIAGNOSIS — E785 Hyperlipidemia, unspecified: Secondary | ICD-10-CM | POA: Diagnosis present

## 2021-01-03 DIAGNOSIS — Z91011 Allergy to milk products: Secondary | ICD-10-CM

## 2021-01-03 DIAGNOSIS — I10 Essential (primary) hypertension: Secondary | ICD-10-CM | POA: Diagnosis present

## 2021-01-03 DIAGNOSIS — Z6841 Body Mass Index (BMI) 40.0 and over, adult: Secondary | ICD-10-CM

## 2021-01-03 DIAGNOSIS — M6289 Other specified disorders of muscle: Secondary | ICD-10-CM | POA: Diagnosis present

## 2021-01-03 DIAGNOSIS — E669 Obesity, unspecified: Secondary | ICD-10-CM

## 2021-01-03 DIAGNOSIS — E860 Dehydration: Secondary | ICD-10-CM | POA: Diagnosis present

## 2021-01-03 DIAGNOSIS — N179 Acute kidney failure, unspecified: Secondary | ICD-10-CM

## 2021-01-03 DIAGNOSIS — Z20822 Contact with and (suspected) exposure to covid-19: Secondary | ICD-10-CM | POA: Diagnosis present

## 2021-01-03 DIAGNOSIS — E876 Hypokalemia: Secondary | ICD-10-CM | POA: Diagnosis present

## 2021-01-03 DIAGNOSIS — E1169 Type 2 diabetes mellitus with other specified complication: Secondary | ICD-10-CM | POA: Diagnosis present

## 2021-01-03 DIAGNOSIS — E1152 Type 2 diabetes mellitus with diabetic peripheral angiopathy with gangrene: Secondary | ICD-10-CM | POA: Diagnosis present

## 2021-01-03 DIAGNOSIS — I96 Gangrene, not elsewhere classified: Secondary | ICD-10-CM | POA: Diagnosis present

## 2021-01-03 LAB — GLUCOSE, CAPILLARY
Glucose-Capillary: 591 mg/dL (ref 70–99)
Glucose-Capillary: >600 mg/dL (ref 70–99)
Glucose-Capillary: >600 mg/dL (ref 70–99)

## 2021-01-03 LAB — COMPREHENSIVE METABOLIC PANEL
ALT: 15 U/L (ref 0–44)
AST: 11 U/L — ABNORMAL LOW (ref 15–41)
Albumin: 2.7 g/dL — ABNORMAL LOW (ref 3.5–5.0)
Alkaline Phosphatase: 188 U/L — ABNORMAL HIGH (ref 38–126)
Anion gap: 13 (ref 5–15)
BUN: 74 mg/dL — ABNORMAL HIGH (ref 6–20)
CO2: 24 mmol/L (ref 22–32)
Calcium: 8.7 mg/dL — ABNORMAL LOW (ref 8.9–10.3)
Chloride: 92 mmol/L — ABNORMAL LOW (ref 98–111)
Creatinine, Ser: 4.47 mg/dL — ABNORMAL HIGH (ref 0.44–1.00)
GFR, Estimated: 11 mL/min — ABNORMAL LOW (ref >60)
Glucose, Bld: 685 mg/dL (ref 70–99)
Potassium: 4.3 mmol/L (ref 3.5–5.1)
Sodium: 129 mmol/L — ABNORMAL LOW (ref 135–145)
Total Bilirubin: 0.8 mg/dL (ref 0.3–1.2)
Total Protein: 8.2 g/dL — ABNORMAL HIGH (ref 6.5–8.1)

## 2021-01-03 LAB — SURGICAL PCR SCREEN
MRSA, PCR: NEGATIVE
Staphylococcus aureus: NEGATIVE

## 2021-01-03 LAB — CBC WITH DIFFERENTIAL/PLATELET
Abs Immature Granulocytes: 0.07 10*3/uL (ref 0.00–0.07)
Basophils Absolute: 0 10*3/uL (ref 0.0–0.1)
Basophils Relative: 0 %
Eosinophils Absolute: 0 10*3/uL (ref 0.0–0.5)
Eosinophils Relative: 0 %
HCT: 34.3 % — ABNORMAL LOW (ref 36.0–46.0)
Hemoglobin: 11 g/dL — ABNORMAL LOW (ref 12.0–15.0)
Immature Granulocytes: 1 %
Lymphocytes Relative: 8 %
Lymphs Abs: 1.2 10*3/uL (ref 0.7–4.0)
MCH: 30.6 pg (ref 26.0–34.0)
MCHC: 32.1 g/dL (ref 30.0–36.0)
MCV: 95.5 fL (ref 80.0–100.0)
Monocytes Absolute: 1.2 10*3/uL — ABNORMAL HIGH (ref 0.1–1.0)
Monocytes Relative: 8 %
Neutro Abs: 13 10*3/uL — ABNORMAL HIGH (ref 1.7–7.7)
Neutrophils Relative %: 83 %
Platelets: 376 10*3/uL (ref 150–400)
RBC: 3.59 MIL/uL — ABNORMAL LOW (ref 3.87–5.11)
RDW: 12.6 % (ref 11.5–15.5)
WBC: 15.5 10*3/uL — ABNORMAL HIGH (ref 4.0–10.5)
nRBC: 0 % (ref 0.0–0.2)

## 2021-01-03 LAB — SARS CORONAVIRUS 2 BY RT PCR (HOSPITAL ORDER, PERFORMED IN ~~LOC~~ HOSPITAL LAB): SARS Coronavirus 2: NEGATIVE

## 2021-01-03 MED ORDER — SODIUM CHLORIDE 0.9 % IV SOLN
INTRAVENOUS | Status: DC | PRN
  Administered 2021-01-03 – 2021-01-05 (×2): 250 mL via INTRAVENOUS

## 2021-01-03 MED ORDER — INSULIN ASPART 100 UNIT/ML IJ SOLN: 0.0000 [IU] | INTRAMUSCULAR | Status: DC

## 2021-01-03 MED ORDER — INSULIN REGULAR(HUMAN) IN NACL 100-0.9 UT/100ML-% IV SOLN
INTRAVENOUS | Status: DC
  Administered 2021-01-03: 11 [IU]/h via INTRAVENOUS
  Filled 2021-01-03: qty 100

## 2021-01-03 MED ORDER — DEXTROSE 50 % IV SOLN: 0.0000 mL | INTRAVENOUS | Status: DC | PRN

## 2021-01-03 MED ORDER — DIPHENHYDRAMINE HCL 25 MG PO CAPS: 25.0000 mg | ORAL_CAPSULE | Freq: Four times a day (QID) | ORAL | Status: DC | PRN

## 2021-01-03 MED ORDER — MORPHINE SULFATE (PF) 2 MG/ML IV SOLN
2.0000 mg | INTRAVENOUS | Status: DC | PRN
  Administered 2021-01-03 – 2021-01-10 (×15): 2 mg via INTRAVENOUS
  Filled 2021-01-03 (×15): qty 1

## 2021-01-03 MED ORDER — MUPIROCIN 2 % EX OINT: 1.0000 "application " | TOPICAL_OINTMENT | Freq: Two times a day (BID) | CUTANEOUS | Status: DC

## 2021-01-03 MED ORDER — OXYCODONE HCL 5 MG PO TABS
5.0000 mg | ORAL_TABLET | ORAL | Status: DC | PRN
Start: 2021-01-03 — End: 2021-01-10
  Administered 2021-01-04: 5 mg via ORAL
  Administered 2021-01-04 – 2021-01-09 (×6): 10 mg via ORAL
  Filled 2021-01-03 (×5): qty 2
  Filled 2021-01-03: qty 1
  Filled 2021-01-03: qty 2

## 2021-01-03 MED ORDER — LIP MEDEX EX OINT
TOPICAL_OINTMENT | CUTANEOUS | Status: DC | PRN
  Filled 2021-01-03: qty 7

## 2021-01-03 MED ORDER — PIPERACILLIN-TAZOBACTAM 3.375 G IVPB
3.3750 g | Freq: Three times a day (TID) | INTRAVENOUS | Status: DC
  Administered 2021-01-03 – 2021-01-10 (×19): 3.375 g via INTRAVENOUS
  Filled 2021-01-03 (×24): qty 50

## 2021-01-03 MED ORDER — DEXTROSE IN LACTATED RINGERS 5 % IV SOLN: INTRAVENOUS | Status: DC

## 2021-01-03 MED ORDER — CHLORHEXIDINE GLUCONATE CLOTH 2 % EX PADS
6.0000 | MEDICATED_PAD | Freq: Every day | CUTANEOUS | Status: DC
  Administered 2021-01-04 – 2021-01-09 (×7): 6 via TOPICAL

## 2021-01-03 MED ORDER — METHOCARBAMOL 500 MG PO TABS
500.0000 mg | ORAL_TABLET | Freq: Four times a day (QID) | ORAL | Status: DC | PRN
  Administered 2021-01-05 – 2021-01-09 (×6): 500 mg via ORAL
  Filled 2021-01-03 (×6): qty 1

## 2021-01-03 MED ORDER — LACTATED RINGERS IV SOLN: INTRAVENOUS | Status: DC

## 2021-01-03 MED ORDER — ACETAMINOPHEN 325 MG PO TABS: 650.0000 mg | ORAL_TABLET | Freq: Four times a day (QID) | ORAL | Status: DC | PRN

## 2021-01-03 MED ORDER — HYDRALAZINE HCL 20 MG/ML IJ SOLN: 10.0000 mg | INTRAMUSCULAR | Status: DC | PRN

## 2021-01-03 MED ORDER — ACETAMINOPHEN 650 MG RE SUPP: 650.0000 mg | Freq: Four times a day (QID) | RECTAL | Status: DC | PRN

## 2021-01-03 MED ORDER — ONDANSETRON HCL 4 MG/2ML IJ SOLN
4.0000 mg | Freq: Four times a day (QID) | INTRAMUSCULAR | Status: DC | PRN
  Administered 2021-01-03 – 2021-01-08 (×3): 4 mg via INTRAVENOUS
  Filled 2021-01-03 (×4): qty 2

## 2021-01-03 MED ORDER — LIP MEDEX EX OINT
TOPICAL_OINTMENT | CUTANEOUS | Status: AC
  Filled 2021-01-03: qty 7

## 2021-01-03 MED ORDER — ONDANSETRON 4 MG PO TBDP: 4.0000 mg | ORAL_TABLET | Freq: Four times a day (QID) | ORAL | Status: DC | PRN

## 2021-01-03 MED ORDER — DIPHENHYDRAMINE HCL 50 MG/ML IJ SOLN: 25.0000 mg | Freq: Four times a day (QID) | INTRAMUSCULAR | Status: DC | PRN

## 2021-01-03 MED ORDER — SODIUM CHLORIDE 0.9 % IV SOLN: INTRAVENOUS | Status: DC

## 2021-01-03 NOTE — Progress Notes (Signed)
General Surgery [facility]  Patient seen and examined with Hedda Slade, PA-C, in our office this afternoon.  Necrotizing wound with drainage left buttock as described in her note.  Discussed admission, IV abx's, diabetes control, and prep for OR with patient and her son at bedside.  They agree to this plan for management.  Discussed with Dr. Wenda Low - anticipate OR tomorrow, 7/1, at [facility].  Discussed with nursing staff on floor 3W this evening.  Darnell Level, MD [facility] Office: [phone]

## 2021-01-03 NOTE — Plan of Care (Signed)
  Problem: Clinical Measurements: Goal: Respiratory complications will improve Outcome: Progressing   Problem: Clinical Measurements: Goal: Cardiovascular complication will be avoided Outcome: Progressing   Problem: Pain Managment: Goal: General experience of comfort will improve Outcome: Progressing   Problem: Skin Integrity: Goal: Risk for impaired skin integrity will decrease Outcome: Progressing   

## 2021-01-03 NOTE — H&P (Signed)
Erin Davila Appointment: 01/03/2021 3:15 PM Location: [facility] Patient #: [phone] DOB: [birth year] Undefined / Language: [place] / Race: White Female   History of Present Illness The patient is a 60 year old female who presents with a subcutaneous abscess. PMHx of diabetes, hypertension, and morbid obesity presenting to the office with concerns of a left buttock abscess x 2 weeks.  She was seen at [facility] on 12/24/20 and underwent bedside I&D releasing a copious amount of purulent fluid.  Packing was left in place and she returned 2 days later at which time the area was slightly improved, but was still very tender.  Her son states she started becoming lethargic a few days ago.  He shows me a progression of photos taken over the last few days which shows an area of necrosis forming on her left buttock.  She states a very foul odor began about a week ago.  She was taking doxycycline and pain medication, which caused vomiting.  He states she had a fever about a week ago but has not had one recently.  She admits to having an abscess on her buttock about 10-15 years ago, but is not sure the exact location.  She does not take any blood thinners.   Past Surgical History  Cesarean Section - 1   Oral Surgery    Diagnostic Studies History  Colonoscopy   never Mammogram   never Pap Smear   >5 years ago  Allergies  No Known Drug Allergies   [01/03/2021]: Allergies Reconciled    Medication History  Azithromycin  (250MG  Tablet, Oral) Active. Celecoxib  (200MG  Capsule, Oral) Active. [employer] Vitamin C  (1000MG  Tablet, Oral) Active. Doxycycline Hyclate  (100MG  Capsule, Oral) Active. Levemir FlexTouch  (100UNIT/ML Soln Pen-inj, Subcutaneous) Active. metFORMIN HCl  (1000MG  Tablet, Oral) Active. Montelukast Sodium  (10MG  Tablet, Oral) Active. Olmesartan Medoxomil-HCTZ  (40-25MG  Tablet, Oral) Active. Simvastatin  (40MG  Tablet, Oral) Active. Zinc  (50MG  Tablet, Oral)  Active. Medications Reconciled   Pregnancy / Birth History  Age at menarche   15 years. Age of menopause   [age] Gravida   1 Irregular periods   Length (months) of breastfeeding   3-6 Maternal age   96-40 Para   1  Other Problems  Diabetes Mellitus   Gastroesophageal Reflux Disease   Other disease, cancer, significant illness     Review of Systems  General Present- Appetite Loss and Fatigue. Not Present- Chills, Fever, Night Sweats, Weight Gain and Weight Loss. Skin Present- New Lesions. Not Present- Change in Wart/Mole, Dryness, Hives, Jaundice, Non-Healing Wounds, Rash and Ulcer. HEENT Present- Seasonal Allergies. Not Present- Earache, Hearing Loss, Hoarseness, Nose Bleed, Oral Ulcers, Ringing in the Ears, Sinus Pain, Sore Throat, Visual Disturbances, Wears glasses/contact lenses and Yellow Eyes. Respiratory Not Present- Bloody sputum, Chronic Cough, Difficulty Breathing, Snoring and Wheezing. Breast Not Present- Breast Mass, Breast Pain, Nipple Discharge and Skin Changes. Cardiovascular Not Present- Chest Pain, Difficulty Breathing Lying Down, Leg Cramps, Palpitations, Rapid Heart Rate, Shortness of Breath and Swelling of Extremities. Gastrointestinal Present- Nausea and Vomiting. Not Present- Abdominal Pain, Bloating, Bloody Stool, Change in Bowel Habits, Chronic diarrhea, Constipation, Difficulty Swallowing, Excessive gas, Gets full quickly at meals, Hemorrhoids, Indigestion and Rectal Pain. Female Genitourinary Present- Nocturia. Not Present- Frequency, Painful Urination, Pelvic Pain and Urgency. Musculoskeletal Present- Muscle Weakness. Not Present- Back Pain, Joint Pain, Joint Stiffness, Muscle Pain and Swelling of Extremities. Neurological Present- Trouble walking and Weakness. Not Present- Decreased Memory, Fainting, Headaches, Numbness,  Seizures, Tingling and Tremor. Psychiatric Not Present- Anxiety, Bipolar, Change in Sleep Pattern, Depression, Fearful and Frequent  crying. Endocrine Not Present- Cold Intolerance, Excessive Hunger, Hair Changes, Heat Intolerance, Hot flashes and New Diabetes. Hematology Not Present- Blood Thinners, Easy Bruising, Excessive bleeding, Gland problems, HIV and Persistent Infections.  Vitals  01/03/2021 3:27 PM Height: 68.5 in  Temp.: 97.63 F    Pulse: 91 (Regular)    P.OX: 100% (Room air) BP: 140/80(Sitting, Left Arm, Standard)    Physical Exam  GENERAL: Morbidly obese female, ill appearing Wearing mask  EYES: No scleral icterus Pupils equal, lids normal  RESPIRATORY: Normal effort, no use of accessory muscles  MUSCULOSKELETAL: In wheelchair  SKIN: Warm and dry Not diaphoretic  Integumentary Left buttock: There is a 8 cm x 4 cm area of necrosis/sloughing skin Medial to this is a 4 cm x 3 cm open wound The overlying gauze and saturated with foul-smelling brown fluid Very tender to palpation   Assessment & Plan  LEFT BUTTOCK ABSCESS (L02.31) She is presenting to the office with a progressively worsening left buttock abscess, with signs of necrosis on exam. There is a very foul odor from the wound and she appears ill. Vital signs are stable, but she would benefit from IV antibiotics and debridement in the operating room. She will go to [facility] when a bed becomes available and will stay NPO after midnight, in anticipation for surgery tomorrow. She agrees this plan.  **This patient encounter took 40 minutes today to perform the following: take history, perform exam, review outside records, interpret imaging, counsel the patient on their diagnosis and document encounter, findings & plan in the EHR.  Signed by Tsosie Billing, PA C (01/03/2021 5:07 PM)

## 2021-01-03 NOTE — H&P (View-Only) (Signed)
General Surgery - [facility]  Notified that admission lab with glucose 685.  Also AKI with Creatinine 4.47.  Will consult [facility] for assistance with management - Dr. Tu.  Discussed with nurse on 3W.  Patient may need to transfer to higher level of care.  Nevin Grizzle, MD [facility] Office: [phone]  

## 2021-01-03 NOTE — Progress Notes (Signed)
Pharmacy Antibiotic Note  Erin Davila is a 60 y.o. female admitted on 01/03/2021 with necrotizing draining wound infection of left buttock.  Pharmacy has been consulted for Zosyn dosing.  Plan: Zosyn 3.375g IV q8h (4 hour infusion). Follow up renal function, culture results, and clinical course.   Height: 5\' 8"  (172.7 cm) Weight: (!) 172.4 kg (380 lb) IBW/kg (Calculated) : 63.9  Temp (24hrs), Avg:98 F (36.7 C), Min:98 F (36.7 C), Max:98 F (36.7 C)  No results for input(s): WBC, CREATININE, LATICACIDVEN, VANCOTROUGH, VANCOPEAK, VANCORANDOM, GENTTROUGH, GENTPEAK, GENTRANDOM, TOBRATROUGH, TOBRAPEAK, TOBRARND, AMIKACINPEAK, AMIKACINTROU, AMIKACIN in the last 168 hours.  CrCl cannot be calculated (No successful lab value found.).    Allergies  Allergen Reactions   Milk-Related Compounds     Lactose      Thank you for allowing pharmacy to be a part of this patient's care.  PharmD, BCPS Clinical Pharmacist [facility] main pharmacy [phone] 01/03/2021 7:09 PM

## 2021-01-03 NOTE — Progress Notes (Signed)
Report given to Judithann Graves Rn. No questions on report given. Pt remains stable with no needs.

## 2021-01-03 NOTE — Plan of Care (Signed)
  Problem: Education: Goal: Knowledge of General Education information will improve Description: Including pain rating scale, medication(s)/side effects and non-pharmacologic comfort measures Outcome: Progressing   Problem: Activity: Goal: Risk for activity intolerance will decrease Outcome: Progressing   Problem: Pain Managment: Goal: General experience of comfort will improve Outcome: Progressing   

## 2021-01-03 NOTE — Progress Notes (Signed)
Pt to icu room 1227 in stable condition. No needs at time of transfer.

## 2021-01-03 NOTE — Consult Note (Signed)
[facility] Medical Consultation  Erin Davila ZSW:[id number] DOB: Jul 07, 1961 DOA: 01/03/2021 PCP: [name], MD   Requesting physician: Dr. Darnell Level Date of consultation: 01/03/2021 Reason for consultation: Hyperglycemia and renal insufficiency  Impression/Recommendations Principal Problem:   Cellulitis and abscess of buttock Active Problems:   Diabetes mellitus type 2 in obese (HCC)   HTN (hypertension)   Morbid obesity (HCC)    Hyperglycemia w/hx of insulin-dependent Type 2 DM       - replete potassium as needed       - Transfer to stepdown unit to start insulin gtt with goal of 140-180 and AG <12       - IV NS until BG <250, then switch to D5 1/2 NS        - BMP q4hr        - keep NPO       -Check HbA1C       - home regimen: 45 U Levermir qHS  AKI       -creatinine of 4.47 on admission with no baseline for comparison but denies hx of CKD.       -obtain UA, urine sodium and urine creatinine       -obtain Renal ultrasound   HTN       -hold ACE for now due to AKI  Necrotizing left gluteal wound       -surgery plans to take for I&D, management per surgery  5.  Morbid obesity      -BMI >55. Complicates clinical course  I will followup again tomorrow. Please contact me if I can be of assistance in the meanwhile. Thank you for this consultation.  Chief Complaint: pain to left buttock wound  HPI:  Erin Davila is a 60yo F with insulin-dependent type 2 diabetes, hypertension and morbid obesity who presents as a direct admission from general surgery outpatient office for necrotizing wound of the left buttocks.  Hospitalist being called for consult for hyperglycemia and renal insufficiency.  Patient reports that she noted a bump to her left buttocks about 2 weeks ago.  She then presented to urgent care on 6/20 and had I&D of her left gluteal abscess.  She was given 1 dose of Rocephin and discharged with doxycycline with follow-up with surgery.  Today she  presented to the general surgery outpatient clinic and wound was found to be necrotic with drainage and patient was directly admitted to [facility]. On obtaining lab work, she was noted to have CBG greater than 600 with no anion gap.  Patient reports that she normally takes 45 units of Levemir but has not taken it for about 9 days since she was not sure what it was do to her wound.  She endorsed nausea and vomiting several days ago but has since resolved.  Denies any fever.  Thinks she had a hemoglobin A1c back in May of around 12. She was also noted to have significantly elevated creatinine greater than 4.  Denies history of chronic kidney disease.  States she has had increased urine output.  Patient takes olmesartan for hypertension.  Review of Systems:  Constitutional: No Weight Change, No Fever ENT/Mouth: No sore throat, No Rhinorrhea Eyes: No Eye Pain, No Vision Changes Cardiovascular: No Chest Pain, no SOB Respiratory: No Cough, No Sputum Gastrointestinal: No Nausea, No Vomiting, No Diarrhea, No Constipation, No Pain Genitourinary: no Urinary Incontinence, No Urgency, No Flank Pain Musculoskeletal: No Arthralgias, No Myalgias Skin: No Skin Lesions, No Pruritus, Neuro: no Weakness,  No Numbness Psych: No Anxiety/Panic, No Depression, no decrease appetite Heme/Lymph: No Bruising, No Bleeding  Past Medical History:  Diagnosis Date   Allergy    Diabetes mellitus without complication (HCC)    Hyperlipidemia    Hypertension    Past Surgical History:  Procedure Laterality Date   CESAREAN SECTION     Social History:  reports that she has never smoked. She has never used smokeless tobacco. She reports that she does not drink alcohol and does not use drugs.  Allergies  Allergen Reactions   Milk-Related Compounds     Lactose   History reviewed. No pertinent family history.  Prior to Admission medications   Medication Sig Start Date End Date Taking? Authorizing Provider  Celecoxib  (CELEBREX PO) Take by mouth.    [provider]  diclofenac (VOLTAREN) 75 MG EC tablet TAKE 1 TABLET (75 MG TOTAL) BY MOUTH 2 (TWO) TIMES DAILY. 10/03/15   Peyton Najjar, MD  doxycycline (VIBRAMYCIN) 100 MG capsule Take 1 capsule (100 mg total) by mouth 2 (two) times daily for 10 days. 12/24/20 01/03/21  Wieters, Hallie C, PA-C  IBUPROFEN PO Take by mouth.    [provider]  insulin detemir (LEVEMIR) 100 UNIT/ML injection Inject into the skin at bedtime.    [provider]  METFORMIN HCL PO Take by mouth.    [provider]  montelukast (SINGULAIR) 10 MG tablet Take 10 mg by mouth at bedtime.    [provider]  olmesartan-hydrochlorothiazide (BENICAR HCT) 40-25 MG per tablet Take 1 tablet by mouth daily.    [provider]  SIMVASTATIN PO Take by mouth.    [provider]  traMADol (ULTRAM) 50 MG tablet Take 1 tablet (50 mg total) by mouth every 8 (eight) hours as needed. Patient not taking: No sig reported 07/31/14   Copland, Gwenlyn Found, MD  traMADol (ULTRAM) 50 MG tablet Take 1 tablet (50 mg total) by mouth every 8 (eight) hours as needed. PATIENT NEEDS OFFICE VISIT FOR ADDITIONAL REFILLS 04/19/15   Peyton Najjar, MD   Physical Exam: Blood pressure (!) 155/67, pulse 91, temperature 98.4 F (36.9 C), temperature source Oral, resp. rate 20, height 5\' 8"  (1.727 m), weight (!) 172.4 kg, last menstrual period 03/26/2015, SpO2 100 %. Vitals:   01/03/21 1755 01/03/21 2128  BP: 131/65 (!) 155/67  Pulse: 91 91  Resp: 18 20  Temp: 98 F (36.7 C) 98.4 F (36.9 C)  SpO2:  100%    General: calm, comfortable laying flat in bed  Eyes: PERRL, lids and conjunctivae normal ENMT: Mucous membranes are moist.  Neck: normal, supple Respiratory: clear to auscultation bilaterally, no wheezing, no crackles. Normal respiratory effort. No accessory muscle use. Cardiovascular: Regular rate and rhythm, no murmurs / rubs / gallops. No extremity  edema.  Abdomen: no tenderness, no masses palpated.  Bowel sounds positive. Musculoskeletal: no clubbing / cyanosis. No joint deformity upper and lower extremities. Good ROM, no contractures. Normal muscle tone. Skin: Large left gluteal wound with drainage and malodorous  Neurologic: CN 2-12 grossly intact. Sensation intact, Strength 5/5 in all 4. Psychiatric: Normal judgment and insight. Alert and oriented x 3. Normal  Labs on Admission:  Basic Metabolic Panel: Recent Labs  Lab 01/03/21 2021  NA 129*  K 4.3  CL 92*  CO2 24  GLUCOSE 685*  BUN 74*  CREATININE 4.47*  CALCIUM 8.7*   Liver Function Tests: Recent Labs  Lab 01/03/21 2021  AST 11*  ALT  15  ALKPHOS 188*  BILITOT 0.8  PROT 8.2*  ALBUMIN 2.7*   No results for input(s): LIPASE, AMYLASE in the last 168 hours. No results for input(s): AMMONIA in the last 168 hours. CBC: Recent Labs  Lab 01/03/21 2021  WBC 15.5*  NEUTROABS 13.0*  HGB 11.0*  HCT 34.3*  MCV 95.5  PLT 376   Cardiac Enzymes: No results for input(s): CKTOTAL, CKMB, CKMBINDEX, TROPONINI in the last 168 hours. BNP: Invalid input(s): POCBNP CBG: Recent Labs  Lab 01/03/21 1950  GLUCAP >600*    Radiological Exams on Admission: No results found.    Time spent: I have spent greater than 45 mins reviewing documentation, lab work, imaging, and greater than > 50 percent face-to face with patient in discussion diagnosis, treatment plan, and coordinating management of care.   Anselm Jungling [facility]   If 7PM-7AM, please contact night-coverage [email or web address]  01/03/2021, 10:25 PM

## 2021-01-03 NOTE — Progress Notes (Addendum)
CRITICAL VALUE STICKER  CRITICAL VALUE: blood glucose of 685  RECEIVER (on-site recipient of call): [employer] rn  DATE & TIME NOTIFIED: 01-03-21 2055  MESSENGER (representative from lab): lab tech   MD NOTIFIED: dr. Gerrit Friends  TIME OF NOTIFICATION: 2055  RESPONSE: md is calling hospitalist for consult

## 2021-01-03 NOTE — Progress Notes (Signed)
General Surgery [facility]  Notified that admission lab with glucose 685.  Also AKI with Creatinine 4.47.  Will consult [facility] for assistance with management - Dr. Cyndia Bent.  Discussed with nurse on 3W.  Patient may need to transfer to higher level of care.  Darnell Level, MD [facility] Office: [phone]

## 2021-01-04 ENCOUNTER — Encounter (HOSPITAL_COMMUNITY): Payer: Self-pay

## 2021-01-04 ENCOUNTER — Observation Stay (HOSPITAL_COMMUNITY): Payer: 59 | Admitting: Certified Registered Nurse Anesthetist

## 2021-01-04 ENCOUNTER — Encounter (HOSPITAL_COMMUNITY): Admission: AD | Disposition: A | Discharge status: Rehabilitation Facility | Payer: Self-pay | Source: Home / Self Care

## 2021-01-04 DIAGNOSIS — A499 Bacterial infection, unspecified: Secondary | ICD-10-CM | POA: Diagnosis not present

## 2021-01-04 DIAGNOSIS — Z794 Long term (current) use of insulin: Secondary | ICD-10-CM | POA: Diagnosis not present

## 2021-01-04 DIAGNOSIS — I10 Essential (primary) hypertension: Secondary | ICD-10-CM | POA: Diagnosis present

## 2021-01-04 DIAGNOSIS — Z20822 Contact with and (suspected) exposure to covid-19: Secondary | ICD-10-CM | POA: Diagnosis present

## 2021-01-04 DIAGNOSIS — L0231 Cutaneous abscess of buttock: Secondary | ICD-10-CM | POA: Diagnosis present

## 2021-01-04 DIAGNOSIS — Z91011 Allergy to milk products: Secondary | ICD-10-CM | POA: Diagnosis not present

## 2021-01-04 DIAGNOSIS — D638 Anemia in other chronic diseases classified elsewhere: Secondary | ICD-10-CM | POA: Diagnosis not present

## 2021-01-04 DIAGNOSIS — R6521 Severe sepsis with septic shock: Secondary | ICD-10-CM | POA: Diagnosis present

## 2021-01-04 DIAGNOSIS — E1152 Type 2 diabetes mellitus with diabetic peripheral angiopathy with gangrene: Secondary | ICD-10-CM | POA: Diagnosis present

## 2021-01-04 DIAGNOSIS — E1165 Type 2 diabetes mellitus with hyperglycemia: Secondary | ICD-10-CM | POA: Diagnosis present

## 2021-01-04 DIAGNOSIS — E860 Dehydration: Secondary | ICD-10-CM | POA: Diagnosis present

## 2021-01-04 DIAGNOSIS — N390 Urinary tract infection, site not specified: Secondary | ICD-10-CM | POA: Diagnosis not present

## 2021-01-04 DIAGNOSIS — R5381 Other malaise: Secondary | ICD-10-CM | POA: Diagnosis not present

## 2021-01-04 DIAGNOSIS — M6289 Other specified disorders of muscle: Secondary | ICD-10-CM | POA: Diagnosis present

## 2021-01-04 DIAGNOSIS — E785 Hyperlipidemia, unspecified: Secondary | ICD-10-CM | POA: Diagnosis present

## 2021-01-04 DIAGNOSIS — N179 Acute kidney failure, unspecified: Secondary | ICD-10-CM | POA: Diagnosis present

## 2021-01-04 DIAGNOSIS — E1169 Type 2 diabetes mellitus with other specified complication: Secondary | ICD-10-CM | POA: Diagnosis not present

## 2021-01-04 DIAGNOSIS — Z79899 Other long term (current) drug therapy: Secondary | ICD-10-CM | POA: Diagnosis not present

## 2021-01-04 DIAGNOSIS — Z6841 Body Mass Index (BMI) 40.0 and over, adult: Secondary | ICD-10-CM | POA: Diagnosis not present

## 2021-01-04 DIAGNOSIS — E669 Obesity, unspecified: Secondary | ICD-10-CM | POA: Diagnosis not present

## 2021-01-04 DIAGNOSIS — E876 Hypokalemia: Secondary | ICD-10-CM | POA: Diagnosis present

## 2021-01-04 DIAGNOSIS — I96 Gangrene, not elsewhere classified: Secondary | ICD-10-CM | POA: Diagnosis present

## 2021-01-04 DIAGNOSIS — A419 Sepsis, unspecified organism: Secondary | ICD-10-CM | POA: Diagnosis present

## 2021-01-04 DIAGNOSIS — L03317 Cellulitis of buttock: Secondary | ICD-10-CM | POA: Diagnosis present

## 2021-01-04 HISTORY — PX: INCISION AND DRAINAGE ABSCESS: SHX5864

## 2021-01-04 LAB — URINALYSIS, ROUTINE W REFLEX MICROSCOPIC
Bacteria, UA: NONE SEEN
Bilirubin Urine: NEGATIVE
Glucose, UA: >=500 mg/dL — AB
Hgb urine dipstick: NEGATIVE
Ketones, ur: NEGATIVE mg/dL
Leukocytes,Ua: NEGATIVE
Nitrite: NEGATIVE
Protein, ur: 30 mg/dL — AB
Specific Gravity, Urine: 1.012 (ref 1.005–1.030)
pH: 5 (ref 5.0–8.0)

## 2021-01-04 LAB — BASIC METABOLIC PANEL
Anion gap: 17 — ABNORMAL HIGH (ref 5–15)
Anion gap: 17 — ABNORMAL HIGH (ref 5–15)
Anion gap: 12 (ref 5–15)
BUN: 74 mg/dL — ABNORMAL HIGH (ref 6–20)
BUN: 74 mg/dL — ABNORMAL HIGH (ref 6–20)
BUN: 76 mg/dL — ABNORMAL HIGH (ref 6–20)
CO2: 23 mmol/L (ref 22–32)
CO2: 23 mmol/L (ref 22–32)
CO2: 27 mmol/L (ref 22–32)
Calcium: 9 mg/dL (ref 8.9–10.3)
Calcium: 9 mg/dL (ref 8.9–10.3)
Calcium: 9 mg/dL (ref 8.9–10.3)
Chloride: 97 mmol/L — ABNORMAL LOW (ref 98–111)
Chloride: 97 mmol/L — ABNORMAL LOW (ref 98–111)
Chloride: 99 mmol/L (ref 98–111)
Creatinine, Ser: 4.53 mg/dL — ABNORMAL HIGH (ref 0.44–1.00)
Creatinine, Ser: 4.54 mg/dL — ABNORMAL HIGH (ref 0.44–1.00)
Creatinine, Ser: 4.75 mg/dL — ABNORMAL HIGH (ref 0.44–1.00)
GFR, Estimated: 11 mL/min — ABNORMAL LOW (ref >60)
GFR, Estimated: 11 mL/min — ABNORMAL LOW (ref >60)
GFR, Estimated: 10 mL/min — ABNORMAL LOW (ref >60)
Glucose, Bld: 303 mg/dL — ABNORMAL HIGH (ref 70–99)
Glucose, Bld: 266 mg/dL — ABNORMAL HIGH (ref 70–99)
Glucose, Bld: 162 mg/dL — ABNORMAL HIGH (ref 70–99)
Potassium: 3.4 mmol/L — ABNORMAL LOW (ref 3.5–5.1)
Potassium: 3.3 mmol/L — ABNORMAL LOW (ref 3.5–5.1)
Potassium: 3.4 mmol/L — ABNORMAL LOW (ref 3.5–5.1)
Sodium: 137 mmol/L (ref 135–145)
Sodium: 137 mmol/L (ref 135–145)
Sodium: 138 mmol/L (ref 135–145)

## 2021-01-04 LAB — CBC
HCT: 35.9 % — ABNORMAL LOW (ref 36.0–46.0)
Hemoglobin: 11.4 g/dL — ABNORMAL LOW (ref 12.0–15.0)
MCH: 30.4 pg (ref 26.0–34.0)
MCHC: 31.8 g/dL (ref 30.0–36.0)
MCV: 95.7 fL (ref 80.0–100.0)
Platelets: 410 10*3/uL — ABNORMAL HIGH (ref 150–400)
RBC: 3.75 MIL/uL — ABNORMAL LOW (ref 3.87–5.11)
RDW: 12.6 % (ref 11.5–15.5)
WBC: 14.3 10*3/uL — ABNORMAL HIGH (ref 4.0–10.5)
nRBC: 0 % (ref 0.0–0.2)

## 2021-01-04 LAB — GLUCOSE, CAPILLARY
Glucose-Capillary: 149 mg/dL — ABNORMAL HIGH (ref 70–99)
Glucose-Capillary: 150 mg/dL — ABNORMAL HIGH (ref 70–99)
Glucose-Capillary: 158 mg/dL — ABNORMAL HIGH (ref 70–99)
Glucose-Capillary: 189 mg/dL — ABNORMAL HIGH (ref 70–99)
Glucose-Capillary: 191 mg/dL — ABNORMAL HIGH (ref 70–99)
Glucose-Capillary: 211 mg/dL — ABNORMAL HIGH (ref 70–99)
Glucose-Capillary: 219 mg/dL — ABNORMAL HIGH (ref 70–99)
Glucose-Capillary: 219 mg/dL — ABNORMAL HIGH (ref 70–99)
Glucose-Capillary: 239 mg/dL — ABNORMAL HIGH (ref 70–99)
Glucose-Capillary: 248 mg/dL — ABNORMAL HIGH (ref 70–99)
Glucose-Capillary: 248 mg/dL — ABNORMAL HIGH (ref 70–99)
Glucose-Capillary: 324 mg/dL — ABNORMAL HIGH (ref 70–99)
Glucose-Capillary: 378 mg/dL — ABNORMAL HIGH (ref 70–99)
Glucose-Capillary: 452 mg/dL — ABNORMAL HIGH (ref 70–99)
Glucose-Capillary: 528 mg/dL (ref 70–99)

## 2021-01-04 LAB — CREATININE, URINE, RANDOM: Creatinine, Urine: 91.66 mg/dL

## 2021-01-04 LAB — SODIUM, URINE, RANDOM: Sodium, Ur: 30 mmol/L

## 2021-01-04 SURGERY — INCISION AND DRAINAGE, ABSCESS
Anesthesia: General

## 2021-01-04 MED ORDER — DEXAMETHASONE SODIUM PHOSPHATE 10 MG/ML IJ SOLN
INTRAMUSCULAR | Status: DC | PRN
  Administered 2021-01-04: 5 mg via INTRAVENOUS

## 2021-01-04 MED ORDER — INSULIN GLARGINE 100 UNIT/ML ~~LOC~~ SOLN
35.0000 [IU] | Freq: Every day | SUBCUTANEOUS | Status: DC
  Administered 2021-01-04 – 2021-01-07 (×4): 35 [IU] via SUBCUTANEOUS
  Filled 2021-01-04 (×4): qty 0.35

## 2021-01-04 MED ORDER — ROCURONIUM BROMIDE 10 MG/ML (PF) SYRINGE
PREFILLED_SYRINGE | INTRAVENOUS | Status: DC | PRN
  Administered 2021-01-04: 40 mg via INTRAVENOUS

## 2021-01-04 MED ORDER — ONDANSETRON HCL 4 MG/2ML IJ SOLN
INTRAMUSCULAR | Status: DC | PRN
  Administered 2021-01-04: 4 mg via INTRAVENOUS

## 2021-01-04 MED ORDER — LIDOCAINE 2% (20 MG/ML) 5 ML SYRINGE
INTRAMUSCULAR | Status: AC
  Filled 2021-01-04: qty 5

## 2021-01-04 MED ORDER — ACETAMINOPHEN 10 MG/ML IV SOLN
INTRAVENOUS | Status: AC
  Filled 2021-01-04: qty 100

## 2021-01-04 MED ORDER — SODIUM CHLORIDE 0.9 % IV SOLN
INTRAVENOUS | Status: DC | PRN
  Administered 2021-01-04: 500 mL via INTRAVENOUS

## 2021-01-04 MED ORDER — LIDOCAINE HCL (CARDIAC) PF 100 MG/5ML IV SOSY
PREFILLED_SYRINGE | INTRAVENOUS | Status: DC | PRN
  Administered 2021-01-04: 100 mg via INTRAVENOUS

## 2021-01-04 MED ORDER — MEPERIDINE HCL 50 MG/ML IJ SOLN: 6.2500 mg | INTRAMUSCULAR | Status: DC | PRN

## 2021-01-04 MED ORDER — FENTANYL CITRATE (PF) 250 MCG/5ML IJ SOLN
INTRAMUSCULAR | Status: AC
  Filled 2021-01-04: qty 5

## 2021-01-04 MED ORDER — ONDANSETRON HCL 4 MG/2ML IJ SOLN
INTRAMUSCULAR | Status: AC
  Filled 2021-01-04: qty 2

## 2021-01-04 MED ORDER — FENTANYL CITRATE (PF) 100 MCG/2ML IJ SOLN
INTRAMUSCULAR | Status: AC
  Administered 2021-01-04: 50 ug via INTRAVENOUS
  Filled 2021-01-04: qty 2

## 2021-01-04 MED ORDER — OXYCODONE HCL 5 MG/5ML PO SOLN: 5.0000 mg | Freq: Once | ORAL | Status: DC | PRN

## 2021-01-04 MED ORDER — MIDAZOLAM HCL 2 MG/2ML IJ SOLN
INTRAMUSCULAR | Status: DC | PRN
  Administered 2021-01-04: 1 mg via INTRAVENOUS

## 2021-01-04 MED ORDER — SUGAMMADEX SODIUM 200 MG/2ML IV SOLN
INTRAVENOUS | Status: DC | PRN
  Administered 2021-01-04: 400 mg via INTRAVENOUS

## 2021-01-04 MED ORDER — PROPOFOL 10 MG/ML IV BOLUS
INTRAVENOUS | Status: AC
  Filled 2021-01-04: qty 40

## 2021-01-04 MED ORDER — HYDROGEN PEROXIDE 3 % EX SOLN
CUTANEOUS | Status: AC
  Filled 2021-01-04: qty 473

## 2021-01-04 MED ORDER — 0.9 % SODIUM CHLORIDE (POUR BTL) OPTIME
TOPICAL | Status: DC | PRN
  Administered 2021-01-04: 1000 mL

## 2021-01-04 MED ORDER — ACETAMINOPHEN 325 MG PO TABS: 325.0000 mg | ORAL_TABLET | ORAL | Status: DC | PRN

## 2021-01-04 MED ORDER — INSULIN ASPART 100 UNIT/ML IJ SOLN
0.0000 [IU] | Freq: Every day | INTRAMUSCULAR | Status: DC
  Administered 2021-01-04: 5 [IU] via SUBCUTANEOUS
  Administered 2021-01-06: 2 [IU] via SUBCUTANEOUS

## 2021-01-04 MED ORDER — ACETAMINOPHEN 10 MG/ML IV SOLN
1000.0000 mg | Freq: Once | INTRAVENOUS | Status: AC
  Administered 2021-01-04: 1000 mg via INTRAVENOUS

## 2021-01-04 MED ORDER — SUCCINYLCHOLINE CHLORIDE 200 MG/10ML IV SOSY
PREFILLED_SYRINGE | INTRAVENOUS | Status: AC
  Filled 2021-01-04: qty 10

## 2021-01-04 MED ORDER — PROPOFOL 10 MG/ML IV BOLUS
INTRAVENOUS | Status: DC | PRN
  Administered 2021-01-04: 200 mg via INTRAVENOUS

## 2021-01-04 MED ORDER — INSULIN ASPART 100 UNIT/ML IJ SOLN
0.0000 [IU] | Freq: Three times a day (TID) | INTRAMUSCULAR | Status: DC
  Administered 2021-01-04: 7 [IU] via SUBCUTANEOUS
  Administered 2021-01-05: 11 [IU] via SUBCUTANEOUS
  Administered 2021-01-05: 20 [IU] via SUBCUTANEOUS
  Administered 2021-01-06: 4 [IU] via SUBCUTANEOUS
  Administered 2021-01-06: 5 [IU] via SUBCUTANEOUS
  Administered 2021-01-06: 4 [IU] via SUBCUTANEOUS
  Administered 2021-01-07: 7 [IU] via SUBCUTANEOUS
  Administered 2021-01-07: 4 [IU] via SUBCUTANEOUS
  Administered 2021-01-07: 11 [IU] via SUBCUTANEOUS
  Administered 2021-01-08 – 2021-01-09 (×3): 4 [IU] via SUBCUTANEOUS
  Administered 2021-01-09 – 2021-01-10 (×2): 3 [IU] via SUBCUTANEOUS

## 2021-01-04 MED ORDER — ORAL CARE MOUTH RINSE
15.0000 mL | Freq: Two times a day (BID) | OROMUCOSAL | Status: DC
  Administered 2021-01-04 – 2021-01-09 (×12): 15 mL via OROMUCOSAL

## 2021-01-04 MED ORDER — HYOSCYAMINE SULFATE 0.125 MG/5ML PO ELIX
0.2500 mg | ORAL_SOLUTION | Freq: Four times a day (QID) | ORAL | Status: DC | PRN
  Administered 2021-01-04: 0.25 mg via ORAL
  Filled 2021-01-04 (×2): qty 10

## 2021-01-04 MED ORDER — MIDAZOLAM HCL 2 MG/2ML IJ SOLN
INTRAMUSCULAR | Status: AC
  Filled 2021-01-04: qty 2

## 2021-01-04 MED ORDER — HYDROGEN PEROXIDE 3 % EX SOLN
CUTANEOUS | Status: DC | PRN
  Administered 2021-01-04: 1 via TOPICAL

## 2021-01-04 MED ORDER — FENTANYL CITRATE (PF) 100 MCG/2ML IJ SOLN
25.0000 ug | INTRAMUSCULAR | Status: DC | PRN
  Administered 2021-01-04: 50 ug via INTRAVENOUS

## 2021-01-04 MED ORDER — OXYCODONE HCL 5 MG PO TABS
5.0000 mg | ORAL_TABLET | Freq: Once | ORAL | Status: DC | PRN
Start: 2021-01-04 — End: 2021-01-04

## 2021-01-04 MED ORDER — ONDANSETRON HCL 4 MG/2ML IJ SOLN: 4.0000 mg | Freq: Once | INTRAMUSCULAR | Status: DC | PRN

## 2021-01-04 MED ORDER — KETOROLAC TROMETHAMINE 30 MG/ML IJ SOLN
30.0000 mg | Freq: Once | INTRAMUSCULAR | Status: AC
  Administered 2021-01-04: 30 mg via INTRAVENOUS

## 2021-01-04 MED ORDER — SUCCINYLCHOLINE CHLORIDE 200 MG/10ML IV SOSY
PREFILLED_SYRINGE | INTRAVENOUS | Status: DC | PRN
  Administered 2021-01-04: 180 mg via INTRAVENOUS

## 2021-01-04 MED ORDER — PHENYLEPHRINE 40 MCG/ML (10ML) SYRINGE FOR IV PUSH (FOR BLOOD PRESSURE SUPPORT)
PREFILLED_SYRINGE | INTRAVENOUS | Status: DC | PRN
  Administered 2021-01-04: 120 ug via INTRAVENOUS
  Administered 2021-01-04 (×2): 80 ug via INTRAVENOUS

## 2021-01-04 MED ORDER — LACTATED RINGERS IV BOLUS
500.0000 mL | Freq: Once | INTRAVENOUS | Status: AC
  Administered 2021-01-04: 500 mL via INTRAVENOUS

## 2021-01-04 MED ORDER — KETOROLAC TROMETHAMINE 30 MG/ML IJ SOLN
INTRAMUSCULAR | Status: AC
  Filled 2021-01-04: qty 1

## 2021-01-04 MED ORDER — DEXAMETHASONE SODIUM PHOSPHATE 10 MG/ML IJ SOLN
INTRAMUSCULAR | Status: AC
  Filled 2021-01-04: qty 1

## 2021-01-04 MED ORDER — ACETAMINOPHEN 160 MG/5ML PO SOLN: 325.0000 mg | ORAL | Status: DC | PRN

## 2021-01-04 MED ORDER — ROCURONIUM BROMIDE 10 MG/ML (PF) SYRINGE
PREFILLED_SYRINGE | INTRAVENOUS | Status: AC
  Filled 2021-01-04: qty 10

## 2021-01-04 SURGICAL SUPPLY — 28 items
BAG COUNTER SPONGE SURGICOUNT (BAG) IMPLANT
BAG SPNG CNTER NS LX DISP (BAG)
BLADE SURG 15 STRL LF DISP TIS (BLADE) ×1 IMPLANT
BLADE SURG 15 STRL SS (BLADE) ×2
BNDG GAUZE ELAST 4 BULKY (GAUZE/BANDAGES/DRESSINGS) IMPLANT
DECANTER SPIKE VIAL GLASS SM (MISCELLANEOUS) IMPLANT
DRAIN PENROSE 0.5X18 (DRAIN) IMPLANT
DRSG PAD ABDOMINAL 8X10 ST (GAUZE/BANDAGES/DRESSINGS) IMPLANT
DRSG VAC ATS MED SENSATRAC (GAUZE/BANDAGES/DRESSINGS) ×2 IMPLANT
ELECT REM PT RETURN 15FT ADLT (MISCELLANEOUS) ×2 IMPLANT
GAUZE SPONGE 4X4 12PLY STRL (GAUZE/BANDAGES/DRESSINGS) IMPLANT
GLOVE SURG ENC TEXT LTX SZ8 (GLOVE) ×2 IMPLANT
GOWN STRL REUS W/TWL XL LVL3 (GOWN DISPOSABLE) ×4 IMPLANT
KIT BASIN OR (CUSTOM PROCEDURE TRAY) ×2 IMPLANT
KIT TURNOVER KIT A (KITS) ×2 IMPLANT
NEEDLE HYPO 22GX1.5 SAFETY (NEEDLE) IMPLANT
PACK LITHOTOMY IV (CUSTOM PROCEDURE TRAY) ×1 IMPLANT
PENCIL SMOKE EVACUATOR (MISCELLANEOUS) IMPLANT
SPONGE T-LAP 18X18 ~~LOC~~+RFID (SPONGE) ×4 IMPLANT
SUT ETHILON 3 0 PS 1 (SUTURE) IMPLANT
SUT VIC AB 3-0 SH 27 (SUTURE)
SUT VIC AB 3-0 SH 27XBRD (SUTURE) IMPLANT
SWAB COLLECTION DEVICE MRSA (MISCELLANEOUS) IMPLANT
SWAB CULTURE ESWAB REG 1ML (MISCELLANEOUS) IMPLANT
SYR BULB IRRIG 60ML STRL (SYRINGE) ×2 IMPLANT
SYR CONTROL 10ML LL (SYRINGE) IMPLANT
TOWEL OR 17X26 10 PK STRL BLUE (TOWEL DISPOSABLE) ×2 IMPLANT
TOWEL OR NON WOVEN STRL DISP B (DISPOSABLE) ×2 IMPLANT

## 2021-01-04 NOTE — Transfer of Care (Signed)
Immediate Anesthesia Transfer of Care Note  Patient: Erin Davila  Procedure(s) Performed: INCISION AND DRAINAGE LEFT BUTTOCK ABSCESS  Patient Location: PACU  Anesthesia Type:General  Level of Consciousness: awake, drowsy and patient cooperative  Airway & Oxygen Therapy: Patient Spontanous Breathing and Patient connected to face mask oxygen  Post-op Assessment: Report given to RN and Post -op Vital signs reviewed and stable  Post vital signs: Reviewed and stable--upon admission to PACU, finger spo2 probe was not picking up correctly. Ear probe on right ear did not pick up. Ear probe on left ear picked up an appropriate waveform with spo2 of 100%.  Last Vitals:  Vitals Value Taken Time  BP 93/59 01/04/21 1603  Temp    Pulse 90 01/04/21 1602  Resp 13 01/04/21 1607  SpO2 100 % 01/04/21 1606  Vitals shown include unvalidated device data.  Last Pain:  Vitals:   01/04/21 1145  TempSrc: Oral  PainSc:       Patients Stated Pain Goal: 1 (01/04/21 0000)  Complications: No notable events documented.

## 2021-01-04 NOTE — Interval H&P Note (Signed)
History and Physical Interval Note:  01/04/2021 2:44 PM  Erin Davila  has presented today for surgery, with the diagnosis of LEFT BUTTOCK ABSCESS.  The various methods of treatment have been discussed with the patient and family. After consideration of risks, benefits and other options for treatment, the patient has consented to  Procedure(s): INCISION AND DRAINAGE EFT BUTTOCK ABSCESS (N/A) as a surgical intervention.  The patient's history has been reviewed, patient examined, no change in status, stable for surgery.  I have reviewed the patient's chart and labs.  Questions were answered to the patient's satisfaction.     Valarie Merino

## 2021-01-04 NOTE — Progress Notes (Signed)
PROGRESS NOTE    Erin Davila  LNL:[id number] DOB: April 25, 1961 DOA: 01/03/2021 PCP: [name], MD   Chief Complain: Left buttock wound  Brief Narrative: Patient is a 60 year old female with history of insulin-dependent diabetes type 2, hypertension, morbid obesity who presented with an direct admission from general surgery outpatient office for evaluation of necrotizing wound on the left buttock.  We were consulted for the management of hyperglycemia/AKI.  She recently had incision and drainage for her left gluteal abscess and was taking antibiotics at home.  On presentation to the office at general surgery, wound was found to be necrotic with persistent drainage and therefore she was sent for direct admission.  On presentation she was severely hyperglycemic, lab work showed severe AKI with creatinine in the range of 4, no history of CKD.  Assessment & Plan:   Principal Problem:   Diabetes mellitus type 2 in obese [facility]) Active Problems:   HTN (hypertension)   Morbid obesity (HCC)   Cellulitis and abscess of buttock   Hyperglycemia due to diabetes mellitus (HCC)   Diabetes type 2/hyperglycemia: Takes insulin at home.  Presented with severe hyperglycemia.  Started on insulin drip initially, changed to Lantus and sliding scale.  Monitor blood sugars.  Follow hemoglobin A1c.  Takes 45 units of Levemir daily at home.  AKI: Creatinine in the range of 4 on presentation.  Takes olmesartan at home.  No history of CKD.  AKI is most likely prerenal secondary to polyuria causing volume depletion.  Ultrasound of the kidneys did not show any obstruction.  Urine electrolytes ordered, not collected yet.  We will continue IV fluids monitoring kidney function.  Avoid nephrotoxins.  We will place a Foley catheter.  If no improvement in the kidney function, we will do a nephrology consultation.  Severe sepsis/septic shock: Presented with severe leukocytosis, hypotension.  Continue IV fluids.  Blood  pressure is better now.  Necrotizing left gluteal wound: Foul smell noted this mrng.Management as per general surgery.  Continue antibiotics, follow cultures  Hypertension: Takes olmesartan.  Currently on hold due to AKI.  Monitor blood pressure.  Patient was hypotensive this morning.  Morbid obesity: BMI of 57  Hypokalemia: Being supplemented with potassium.        Antimicrobials:  Anti-infectives (From admission, onward)    Start     Dose/Rate Route Frequency Ordered Stop   01/03/21 2000  piperacillin-tazobactam (ZOSYN) IVPB 3.375 g        3.375 g 12.5 mL/hr over 240 Minutes Intravenous Every 8 hours 01/03/21 1908         Subjective:  Patient seen and examined at the bedside this morning.  Hemodynamically stable.  Overall looks comfortable.  Wound noticed to be foul-smelling.  Denies any new complaints.  Afebrile   Objective: Vitals:   01/04/21 0530 01/04/21 0600 01/04/21 0630 01/04/21 0700  BP: (!) 121/55 (!) 116/52 112/61 101/60  Pulse:      Resp: 13 16 14 14   Temp:      TempSrc:      SpO2:      Weight:      Height:        Intake/Output Summary (Last 24 hours) at 01/04/2021 0756 Last data filed at 01/04/2021 0500 Gross per 24 hour  Intake 1654.34 ml  Output --  Net 1654.34 ml   Filed Weights   01/03/21 1755  Weight: (!) 172.4 kg    Examination:  General exam:Not in distress, morbidly obese HEENT:PERRL,Oral mucosa moist, Ear/Nose normal on  gross exam Respiratory system: Bilateral equal air entry, normal vesicular breath sounds, no wheezes or crackles  Cardiovascular system: S1 & S2 heard, RRR. No JVD, murmurs, rubs, gallops or clicks. No pedal edema. Gastrointestinal system: Abdomen is nondistended, soft and nontender. No organomegaly or masses felt. Normal bowel sounds heard. Central nervous system: Alert and oriented. No focal neurological deficits. Extremities: No edema, no clubbing ,no cyanosis. Skin: Foul-smelling necrotic wound on the left  buttock    Data Reviewed: I have personally reviewed following labs and imaging studies  CBC: Recent Labs  Lab 01/03/21 2021 01/04/21 0228  WBC 15.5* 14.3*  NEUTROABS 13.0*  --   HGB 11.0* 11.4*  HCT 34.3* 35.9*  MCV 95.5 95.7  PLT 376 410*   Basic Metabolic Panel: Recent Labs  Lab 01/03/21 2021 01/04/21 0228 01/04/21 0543  NA 129* 137 137  K 4.3 3.4* 3.3*  CL 92* 97* 97*  CO2 24 23 23   GLUCOSE 685* 303* 266*  BUN 74* 74* 74*  CREATININE 4.47* 4.53* 4.54*  CALCIUM 8.7* 9.0 9.0   GFR: Estimated Creatinine Clearance: 22.9 mL/min (A) (by C-G formula based on SCr of 4.54 mg/dL (H)). Liver Function Tests: Recent Labs  Lab 01/03/21 2021  AST 11*  ALT 15  ALKPHOS 188*  BILITOT 0.8  PROT 8.2*  ALBUMIN 2.7*   No results for input(s): LIPASE, AMYLASE in the last 168 hours. No results for input(s): AMMONIA in the last 168 hours. Coagulation Profile: No results for input(s): INR, PROTIME in the last 168 hours. Cardiac Enzymes: No results for input(s): CKTOTAL, CKMB, CKMBINDEX, TROPONINI in the last 168 hours. BNP (last 3 results) No results for input(s): PROBNP in the last 8760 hours. HbA1C: No results for input(s): HGBA1C in the last 72 hours. CBG: Recent Labs  Lab 01/04/21 0341 01/04/21 0445 01/04/21 0550 01/04/21 0647 01/04/21 0754  GLUCAP 248* 248* 239* 211* 219*   Lipid Profile: No results for input(s): CHOL, HDL, LDLCALC, TRIG, CHOLHDL, LDLDIRECT in the last 72 hours. Thyroid Function Tests: No results for input(s): TSH, T4TOTAL, FREET4, T3FREE, THYROIDAB in the last 72 hours. Anemia Panel: No results for input(s): VITAMINB12, FOLATE, FERRITIN, TIBC, IRON, RETICCTPCT in the last 72 hours. Sepsis Labs: No results for input(s): PROCALCITON, LATICACIDVEN in the last 168 hours.  Recent Results (from the past 240 hour(s))  Surgical PCR screen     Status: None   Collection Time: 01/03/21  9:00 PM   Specimen: Nasal Mucosa; Nasal Swab  Result Value Ref  Range Status   MRSA, PCR NEGATIVE NEGATIVE Final   Staphylococcus aureus NEGATIVE NEGATIVE Final    Comment: (NOTE) The Xpert SA Assay (FDA approved for NASAL specimens in patients 57 years of age and older), is one component of a comprehensive surveillance program. It is not intended to diagnose infection nor to guide or monitor treatment. Performed at [facility], [address], [place], [place]   SARS Coronavirus 2 by RT PCR (hospital order, performed in [facility] hospital lab) Nasopharyngeal Nasal Mucosa     Status: None   Collection Time: 01/03/21  9:13 PM   Specimen: Nasal Mucosa; Nasopharyngeal  Result Value Ref Range Status   SARS Coronavirus 2 NEGATIVE NEGATIVE Final    Comment: (NOTE) SARS-CoV-2 target nucleic acids are NOT DETECTED.  The SARS-CoV-2 RNA is generally detectable in upper and lower respiratory specimens during the acute phase of infection. The lowest concentration of SARS-CoV-2 viral copies this assay can detect is 250 copies /  mL. A negative result does not preclude SARS-CoV-2 infection and should not be used as the sole basis for treatment or other patient management decisions.  A negative result may occur with improper specimen collection / handling, submission of specimen other than nasopharyngeal swab, presence of viral mutation(s) within the areas targeted by this assay, and inadequate number of viral copies (<250 copies / mL). A negative result must be combined with clinical observations, patient history, and epidemiological information.  Fact Sheet for Patients:   BoilerBrush.com.cy  Fact Sheet for Healthcare Providers: https://pope.com/  This test is not yet approved or  cleared by the [place] FDA and has been authorized for detection and/or diagnosis of SARS-CoV-2 by FDA under an Emergency Use Authorization (EUA).  This EUA will remain in effect (meaning this  test can be used) for the duration of the COVID-19 declaration under Section 564(b)(1) of the Act, 21 U.S.C. section 360bbb-3(b)(1), unless the authorization is terminated or revoked sooner.  Performed at [facility], [address], [place], [place] [address]          Radiology Studies: US RENAL  Result Date: 01/04/2021 CLINICAL DATA:  Acute renal insufficiency EXAM: RENAL / URINARY TRACT ULTRASOUND COMPLETE COMPARISON:  None. FINDINGS: Right Kidney: Renal measurements: 10.0 x 4.6 x 4.2 cm = volume: 101 mL. Slightly limited evaluation of the lower pole the right kidney due to overlying bowel gas. Echogenicity within normal limits. No mass or hydronephrosis visualized. Left Kidney: Renal measurements: 10.9 x 5.7 x 4.9 cm = volume: 159 mL. Echogenicity within normal limits. No mass or hydronephrosis visualized. Bladder: Appears normal for degree of bladder distention. Bilateral ureteral jets are identified. Other: None. IMPRESSION: Normal renal sonogram Electronically Signed   By: Helyn Numbers MD   On: 01/04/2021 00:49        Scheduled Meds:  Chlorhexidine Gluconate Cloth  6 each Topical Daily   mouth rinse  15 mL Mouth Rinse BID   Continuous Infusions:  sodium chloride 10 mL/hr at 01/04/21 0500   sodium chloride Stopped (01/04/21 0423)   dextrose 5% lactated ringers 125 mL/hr at 01/04/21 0500   insulin 6 Units/hr (01/04/21 0500)   lactated ringers Stopped (01/04/21 0235)   piperacillin-tazobactam (ZOSYN)  IV 12.5 mL/hr at 01/04/21 0500     LOS: 1 day    Time spent:35 mins. More than 50% of that time was spent in counseling and/or coordination of care.      Burnadette Pop, MD [facility] P7/07/2020, 7:56 AM

## 2021-01-04 NOTE — Anesthesia Procedure Notes (Signed)
Procedure Name: Intubation Date/Time: 01/04/2021 3:03 PM Performed by: Raenette Rover, CRNA Pre-anesthesia Checklist: Patient identified, Emergency Drugs available, Suction available and Patient being monitored Patient Re-evaluated:Patient Re-evaluated prior to induction Oxygen Delivery Method: Circle system utilized Preoxygenation: Pre-oxygenation with 100% oxygen Induction Type: IV induction Ventilation: Mask ventilation without difficulty Laryngoscope Size: Mac and 3 Grade View: Grade I Tube type: Oral Tube size: 7.0 mm Number of attempts: 1 Airway Equipment and Method: Stylet Placement Confirmation: ETT inserted through vocal cords under direct vision, positive ETCO2 and breath sounds checked- equal and bilateral Secured at: 21 cm Tube secured with: Tape Dental Injury: Teeth and Oropharynx as per pre-operative assessment

## 2021-01-04 NOTE — TOC Initial Note (Signed)
Transition of Care [facility]) - Initial/Assessment Note    Patient Details  Name: Erin Davila MRN: [id number] Date of Birth: 1960/12/19  Transition [facility]) CM/SW Contact:    Golda Acre, RN Phone Number: 01/04/2021, 8:05 AM  Clinical Narrative:                 General Surgery - [facility]     Patient seen and examined with Hedda Slade, PA-C, in our office this afternoon.     Necrotizing wound with drainage left buttock as described in her note.     Discussed admission, IV abx's, diabetes control, and prep for OR with patient and her son at bedside.  They agree to this plan for management.     Discussed with Dr. Wenda Low - anticipate OR tomorrow, 7/1, at [facility].  Toc plan of care: Following fpor progression, due to buttock wound and lives alone may need hhc at [facility] Expected Discharge Plan: Home w Home Health Services Barriers to Discharge: Continued Medical Work up   Patient Goals and CMS Choice Patient states their goals for this hospitalization and ongoing recovery are:: to be able to go home CMS Medicare.gov Compare Post Acute Care list provided to:: Patient    Expected Discharge Plan and Services Expected Discharge Plan: Home w Home Health Services   Discharge Planning Services: CM Consult   Living arrangements for the past 2 months: Single Family Home                                      Prior Living Arrangements/Services Living arrangements for the past 2 months: Single Family Home Lives with:: Self Patient language and need for interpreter reviewed:: Yes              Criminal Activity/Legal Involvement Pertinent to Current Situation/Hospitalization: No - Comment as needed  Activities of Daily Living Home Assistive Devices/Equipment: Cane (specify quad or straight) ADL Screening (condition at time of admission) Patient's cognitive ability adequate to safely complete daily activities?: Yes Is the patient deaf or  have difficulty hearing?: No Does the patient have difficulty seeing, even when wearing glasses/contacts?: No Does the patient have difficulty concentrating, remembering, or making decisions?: No Patient able to express need for assistance with ADLs?: Yes Does the patient have difficulty dressing or bathing?: No Independently performs ADLs?: Yes (appropriate for developmental age) Does the patient have difficulty walking or climbing stairs?: Yes Weakness of Legs: Both Weakness of Arms/Hands: None  Permission Sought/Granted                  Emotional Assessment Appearance:: Appears stated age Attitude/Demeanor/Rapport: Engaged Affect (typically observed): Calm Orientation: : Oriented to Self, Oriented to Place, Oriented to  Time, Oriented to Situation Alcohol / Substance Use: Not Applicable Psych Involvement: No (comment)  Admission diagnosis:  Left buttock abscess [L02.31] Patient Active Problem List   Diagnosis Date Noted   Cellulitis and abscess of buttock 01/03/2021   Hyperglycemia due to diabetes mellitus (HCC) 01/03/2021   Trochanteric bursitis of both hips 02/01/2015   Diabetes mellitus type 2 in obese (HCC) 07/31/2014   HTN (hypertension) 07/31/2014   Morbid obesity (HCC) 07/31/2014   PCP:  [name], MD Pharmacy:   [id number] [place], [place] - [address] [place] [address] Phone: [phone] Fax: [phone]     Social Determinants of Health (  SDOH) Interventions    Readmission Risk Interventions No flowsheet data found.

## 2021-01-04 NOTE — Op Note (Signed)
SERENIDY WALTZ  [birth year]   01/04/2021    PCP:  [name], MD   Surgeon: Wenda Low, MD, FACS  Asst:  none  Anes:  general  Preop Dx: Large ulcer of the left buttocks  Postop Dx: Same  6"  x 4' and 2 " deep  Procedure: Sharp debridement down until it bled-removing gray foul smelling liquified tissue Location Surgery: [facility] 4 Complications: none  EBL:   minimal cc  Drains: Wound VAC  Description of Procedure:  The patient was taken to OR 4 .  After anesthesia was administered and the patient was prepped  with chloroprep and placed in the prone position  and a timeout was performed.  Sharp dissection was used to debride this nasty wound.  Gray, slimy dead tissue was sharply cut away.  Gauze sponges were used to mechanically debride the area down on the bone.  Thin bridges of skin were incised to convert this to a large cavity.  Hydrogen peroxide was used to cleanse and then the medium VAC was applied.    The patient tolerated the procedure well and was taken to the PACU in stable condition.     Matt B. Daphine Deutscher, MD, [facility], [place] [phone]

## 2021-01-04 NOTE — Progress Notes (Signed)
Inpatient Diabetes Program Recommendations  AACE/ADA: New Consensus Statement on Inpatient Glycemic Control (2015)  Target Ranges:  Prepandial:   less than 140 mg/dL      Peak postprandial:   less than 180 mg/dL (1-2 hours)      Critically ill patients:  140 - 180 mg/dL   Lab Results  Component Value Date   GLUCAP 158 (H) 01/04/2021   HGBA1C 6.1 02/01/2015    Review of Glycemic Control  Diabetes history: DM2 Outpatient Diabetes medications: Levemir 45 units QHS, metformin 1000 mg BID Current orders for Inpatient glycemic control: Lantus 35 units QD, Novolog 0-20 units TID with meals and 0-5 HS  HgbA1C pending  Inpatient Diabetes Program Recommendations:    Change Novolog to 0-20 units Q4H while NPO. Follow glucose trends.   Thank you. Ailene Ards, RD, LDN, CDE Inpatient Diabetes Coordinator [phone]

## 2021-01-04 NOTE — Anesthesia Preprocedure Evaluation (Addendum)
Anesthesia Evaluation  Patient identified by MRN, date of birth, ID band Patient awake    Reviewed: Allergy & Precautions, H&P , NPO status , Patient's Chart, lab work & pertinent test results, reviewed documented beta blocker date and time   Airway Mallampati: I  TM Distance: >3 FB Neck ROM: full    Dental no notable dental hx. (+) Teeth Intact, Dental Advisory Given, Poor Dentition   Pulmonary neg pulmonary ROS,    Pulmonary exam normal breath sounds clear to auscultation       Cardiovascular Exercise Tolerance: Good hypertension, Pt. on medications negative cardio ROS   Rhythm:regular Rate:Normal     Neuro/Psych negative neurological ROS  negative psych ROS   GI/Hepatic negative GI ROS, Neg liver ROS,   Endo/Other  diabetes, Type obesity  Renal/GU ARFRenal disease  negative genitourinary   Musculoskeletal   Abdominal   Peds  Hematology negative hematology ROS (+)   Anesthesia Other Findings   Reproductive/Obstetrics negative OB ROS                           Anesthesia Physical Anesthesia Plan  ASA: 3 and emergent  Anesthesia Plan: General   Post-op Pain Management:    Induction: Intravenous and Cricoid pressure planned  PONV Risk Score and Plan: 3 and Ondansetron, Midazolam and Treatment may vary due to age or medical condition  Airway Management Planned: Oral ETT and LMA  Additional Equipment: None  Intra-op Plan:   Post-operative Plan: Extubation in OR  Informed Consent: I have reviewed the patients History and Physical, chart, labs and discussed the procedure including the risks, benefits and alternatives for the proposed anesthesia with the patient or authorized representative who has indicated his/her understanding and acceptance.     Dental Advisory Given  Plan Discussed with: CRNA and Anesthesiologist  Anesthesia Plan Comments: (  )         Anesthesia Quick Evaluation

## 2021-01-05 LAB — CBC WITH DIFFERENTIAL/PLATELET
Abs Immature Granulocytes: 0.13 10*3/uL — ABNORMAL HIGH (ref 0.00–0.07)
Basophils Absolute: 0 10*3/uL (ref 0.0–0.1)
Basophils Relative: 0 %
Eosinophils Absolute: 0 10*3/uL (ref 0.0–0.5)
Eosinophils Relative: 0 %
HCT: 30.8 % — ABNORMAL LOW (ref 36.0–46.0)
Hemoglobin: 9.7 g/dL — ABNORMAL LOW (ref 12.0–15.0)
Immature Granulocytes: 1 %
Lymphocytes Relative: 9 %
Lymphs Abs: 1.5 10*3/uL (ref 0.7–4.0)
MCH: 30.6 pg (ref 26.0–34.0)
MCHC: 31.5 g/dL (ref 30.0–36.0)
MCV: 97.2 fL (ref 80.0–100.0)
Monocytes Absolute: 1.3 10*3/uL — ABNORMAL HIGH (ref 0.1–1.0)
Monocytes Relative: 7 %
Neutro Abs: 14.3 10*3/uL — ABNORMAL HIGH (ref 1.7–7.7)
Neutrophils Relative %: 83 %
Platelets: 364 10*3/uL (ref 150–400)
RBC: 3.17 MIL/uL — ABNORMAL LOW (ref 3.87–5.11)
RDW: 12.9 % (ref 11.5–15.5)
WBC: 17.2 10*3/uL — ABNORMAL HIGH (ref 4.0–10.5)
nRBC: 0 % (ref 0.0–0.2)

## 2021-01-05 LAB — HEMOGLOBIN A1C
Hgb A1c MFr Bld: 12.8 % — ABNORMAL HIGH (ref 4.8–5.6)
Mean Plasma Glucose: 321 mg/dL

## 2021-01-05 LAB — BASIC METABOLIC PANEL
Anion gap: 12 (ref 5–15)
BUN: 79 mg/dL — ABNORMAL HIGH (ref 6–20)
CO2: 24 mmol/L (ref 22–32)
Calcium: 8.2 mg/dL — ABNORMAL LOW (ref 8.9–10.3)
Chloride: 95 mmol/L — ABNORMAL LOW (ref 98–111)
Creatinine, Ser: 4.91 mg/dL — ABNORMAL HIGH (ref 0.44–1.00)
GFR, Estimated: 10 mL/min — ABNORMAL LOW (ref >60)
Glucose, Bld: 410 mg/dL — ABNORMAL HIGH (ref 70–99)
Potassium: 3.8 mmol/L (ref 3.5–5.1)
Sodium: 131 mmol/L — ABNORMAL LOW (ref 135–145)

## 2021-01-05 LAB — GLUCOSE, CAPILLARY
Glucose-Capillary: 329 mg/dL — ABNORMAL HIGH (ref 70–99)
Glucose-Capillary: 364 mg/dL — ABNORMAL HIGH (ref 70–99)
Glucose-Capillary: 266 mg/dL — ABNORMAL HIGH (ref 70–99)
Glucose-Capillary: 101 mg/dL — ABNORMAL HIGH (ref 70–99)
Glucose-Capillary: 81 mg/dL (ref 70–99)

## 2021-01-05 NOTE — Progress Notes (Signed)
Spoke with patient on the phone about her diabetes. States that she was diagnosed about 2-3 years ago. Has been on Levemir 45 units at [facility] for about 2 years. States that she was taken off  insulin for a while because her blood sugars were better. Discussed her HgbA1C of 12.8% and she stated that she was started back on insulin because of her A1C. She states that she had not taken her Levemir for a few days because she did not feel good. Seems to be aware of what she needs to eat. She was wanting sugar free liquid items for her tray since she knew that her blood sugars were elevated.   Will continue to monitor blood sugars while in the hospital. Encouraged patient to get her blood sugars under control so that she would heal properly from her surgery. Needs to follow up with PCP after discharge. Encouraged patient to check blood sugars at home X2 per day until she sees her PCP.   Smith Mince RN BSN CDE Diabetes Coordinator Pager: [phone]  8am-5pm

## 2021-01-05 NOTE — Progress Notes (Signed)
PROGRESS NOTE    Erin Davila  TFT:[id number] DOB: 1961/06/24 DOA: 01/03/2021 PCP: [name], MD    Brief Narrative:  This 60 year old female with history of insulin-dependent diabetes type 2, hypertension, morbid obesity who presented as direct admission from general surgery outpatient office for evaluation of necrotizing wound on the left buttock.  We were consulted for the management of hyperglycemia /AKI.  She recently had incision and drainage for her left gluteal abscess and was taking antibiotics at home. On presentation to the office at general surgery, wound was found to be necrotic with persistent drainage and therefore she was sent for direct admission.  On presentation she was severely hyperglycemic, lab work showed severe AKI with creatinine in the range of 4, no history of CKD.  Assessment & Plan:   Principal Problem:   Diabetes mellitus type 2 in obese [facility]) Active Problems:   HTN (hypertension)   Morbid obesity (HCC)   Cellulitis and abscess of buttock   Hyperglycemia due to diabetes mellitus (HCC)   Left buttock abscess   Diabetes type 2/ Hyperglycemia:  She takes 45 units Levemir insulin at home. She presented with severe hyperglycemia.   She was started on insulin drip initially, changed to Lantus and sliding scale.  Monitor blood sugars.   Hb A1c 12.5.  Increase Levemir to 45 units daily, change sliding scale to resistant. Diabetic consult appreciated.   AKI:  Creatinine in the range of 4 on presentation.  She takes olmesartan at home.  No history of CKD.   AKI is most likely prerenal secondary to polyuria causing volume depletion.   Renal Ultrasound did not show any obstruction.  Urine electrolytes ordered, not collected yet.   Continue IV fluids,  monitoring kidney function.  Avoid nephrotoxins. Continue foley catheter. Renal functions not improving, nephrology consulted.   Severe sepsis/septic shock:  She presented with severe leukocytosis,  hypotension.  Continue IV fluids.  Blood pressure is better now.   Necrotizing left gluteal wound:  Foul smell noted this mrng. Management as per general surgery.  Continue antibiotics, follow cultures.   Hypertension: She takes olmesartan.  Currently on hold due to AKI.  Monitor blood pressure.     Morbid obesity: BMI of 57. Diet counseling completed.   Hypokalemia: Replaced . Continue to monitor     Subjective: Patient was seen and examined at bedside overnight events noted.  He reports feeling better.  Objective: Vitals:   01/05/21 0208 01/05/21 0602 01/05/21 1016 01/05/21 1405  BP: (!) 153/94 110/71 (!) 105/29 118/62  Pulse: 84 77 81 81  Resp: 20 20 17 18   Temp: 98 F (36.7 C) 97.9 F (36.6 C) 98.3 F (36.8 C) 98.7 F (37.1 C)  TempSrc: Oral Oral  Oral  SpO2: 100% 100% 100% 100%  Weight:      Height:        Intake/Output Summary (Last 24 hours) at 01/05/2021 1503 Last data filed at 01/05/2021 1000 Gross per 24 hour  Intake 914.15 ml  Output 560 ml  Net 354.15 ml   Filed Weights   01/03/21 1755  Weight: (!) 172.4 kg    Examination:  General exam: Appears calm and comfortable, not in any acute distress. Respiratory system: Clear to auscultation. Respiratory effort normal. Cardiovascular system: S1 & S2 heard, RRR. No JVD, murmurs, rubs, gallops or clicks. No pedal edema. Gastrointestinal system: Abdomen is nondistended, soft and nontender. No organomegaly or masses felt. Normal bowel sounds heard. Central nervous system: Alert and oriented. No  focal neurological deficits. Extremities: Symmetric 5 x 5 power. Skin: There is foul-smelling wound noted on left buttock. Psychiatry: Judgement and insight appear normal. Mood & affect appropriate.     Data Reviewed: I have personally reviewed following labs and imaging studies  CBC: Recent Labs  Lab 01/03/21 2021 01/04/21 0228 01/05/21 0543  WBC 15.5* 14.3* 17.2*  NEUTROABS 13.0*  --  14.3*  HGB 11.0* 11.4*  9.7*  HCT 34.3* 35.9* 30.8*  MCV 95.5 95.7 97.2  PLT 376 410* 364   Basic Metabolic Panel: Recent Labs  Lab 01/03/21 2021 01/04/21 0228 01/04/21 0543 01/04/21 0957 01/05/21 0543  NA 129* 137 137 138 131*  K 4.3 3.4* 3.3* 3.4* 3.8  CL 92* 97* 97* 99 95*  CO2 24 23 23 27 24   GLUCOSE 685* 303* 266* 162* 410*  BUN 74* 74* 74* 76* 79*  CREATININE 4.47* 4.53* 4.54* 4.75* 4.91*  CALCIUM 8.7* 9.0 9.0 9.0 8.2*   GFR: Estimated Creatinine Clearance: 21.2 mL/min (A) (by C-G formula based on SCr of 4.91 mg/dL (H)). Liver Function Tests: Recent Labs  Lab 01/03/21 2021  AST 11*  ALT 15  ALKPHOS 188*  BILITOT 0.8  PROT 8.2*  ALBUMIN 2.7*   No results for input(s): LIPASE, AMYLASE in the last 168 hours. No results for input(s): AMMONIA in the last 168 hours. Coagulation Profile: No results for input(s): INR, PROTIME in the last 168 hours. Cardiac Enzymes: No results for input(s): CKTOTAL, CKMB, CKMBINDEX, TROPONINI in the last 168 hours. BNP (last 3 results) No results for input(s): PROBNP in the last 8760 hours. HbA1C: Recent Labs    01/03/21 2021  HGBA1C 12.8*   CBG: Recent Labs  Lab 01/04/21 1720 01/04/21 2123 01/05/21 0750 01/05/21 0813 01/05/21 1157  GLUCAP 189* 378* 329* 364* 266*   Lipid Profile: No results for input(s): CHOL, HDL, LDLCALC, TRIG, CHOLHDL, LDLDIRECT in the last 72 hours. Thyroid Function Tests: No results for input(s): TSH, T4TOTAL, FREET4, T3FREE, THYROIDAB in the last 72 hours. Anemia Panel: No results for input(s): VITAMINB12, FOLATE, FERRITIN, TIBC, IRON, RETICCTPCT in the last 72 hours. Sepsis Labs: No results for input(s): PROCALCITON, LATICACIDVEN in the last 168 hours.  Recent Results (from the past 240 hour(s))  Surgical PCR screen     Status: None   Collection Time: 01/03/21  9:00 PM   Specimen: Nasal Mucosa; Nasal Swab  Result Value Ref Range Status   MRSA, PCR NEGATIVE NEGATIVE Final   Staphylococcus aureus NEGATIVE NEGATIVE  Final    Comment: (NOTE) The Xpert SA Assay (FDA approved for NASAL specimens in patients 54 years of age and older), is one component of a comprehensive surveillance program. It is not intended to diagnose infection nor to guide or monitor treatment. Performed at [facility], [address] [place], [place]   SARS Coronavirus 2 by RT PCR (hospital order, performed in [facility] hospital lab) Nasopharyngeal Nasal Mucosa     Status: None   Collection Time: 01/03/21  9:13 PM   Specimen: Nasal Mucosa; Nasopharyngeal  Result Value Ref Range Status   SARS Coronavirus 2 NEGATIVE NEGATIVE Final    Comment: (NOTE) SARS-CoV-2 target nucleic acids are NOT DETECTED.  The SARS-CoV-2 RNA is generally detectable in upper and lower respiratory specimens during the acute phase of infection. The lowest concentration of SARS-CoV-2 viral copies this assay can detect is 250 copies / mL. A negative result does not preclude SARS-CoV-2 infection and should not be used as the  sole basis for treatment or other patient management decisions.  A negative result may occur with improper specimen collection / handling, submission of specimen other than nasopharyngeal swab, presence of viral mutation(s) within the areas targeted by this assay, and inadequate number of viral copies (<250 copies / mL). A negative result must be combined with clinical observations, patient history, and epidemiological information.  Fact Sheet for Patients:   BoilerBrush.com.cy  Fact Sheet for Healthcare Providers: https://pope.com/  This test is not yet approved or  cleared by the [place] FDA and has been authorized for detection and/or diagnosis of SARS-CoV-2 by FDA under an Emergency Use Authorization (EUA).  This EUA will remain in effect (meaning this test can be used) for the duration of the COVID-19 declaration under Section 564(b)(1) of  the Act, 21 U.S.C. section 360bbb-3(b)(1), unless the authorization is terminated or revoked sooner.  Performed at [facility], [address] [place], [place] [address]      Radiology Studies: US RENAL  Result Date: 01/04/2021 CLINICAL DATA:  Acute renal insufficiency EXAM: RENAL / URINARY TRACT ULTRASOUND COMPLETE COMPARISON:  None. FINDINGS: Right Kidney: Renal measurements: 10.0 x 4.6 x 4.2 cm = volume: 101 mL. Slightly limited evaluation of the lower pole the right kidney due to overlying bowel gas. Echogenicity within normal limits. No mass or hydronephrosis visualized. Left Kidney: Renal measurements: 10.9 x 5.7 x 4.9 cm = volume: 159 mL. Echogenicity within normal limits. No mass or hydronephrosis visualized. Bladder: Appears normal for degree of bladder distention. Bilateral ureteral jets are identified. Other: None. IMPRESSION: Normal renal sonogram Electronically Signed   By: Helyn Numbers MD   On: 01/04/2021 00:49    Scheduled Meds:  Chlorhexidine Gluconate Cloth  6 each Topical Daily   insulin aspart  0-20 Units Subcutaneous TID WC   insulin aspart  0-5 Units Subcutaneous QHS   insulin glargine  35 Units Subcutaneous Daily   mouth rinse  15 mL Mouth Rinse BID   Continuous Infusions:  sodium chloride 250 mL (01/05/21 0310)   sodium chloride Stopped (01/04/21 1107)   lactated ringers 125 mL/hr at 01/04/21 1447   piperacillin-tazobactam (ZOSYN)  IV 3.375 g (01/05/21 1244)     LOS: 2 days    Time spent: 25 mins    Trayon Krantz, MD [facility]   If 7PM-7AM, please contact night-coverage

## 2021-01-05 NOTE — Progress Notes (Signed)
Report obtained from RN in ICU

## 2021-01-05 NOTE — TOC Progression Note (Signed)
Transition of Care [facility]) - Progression Note    Patient Details  Name: Erin Davila MRN: [id number] Date of Birth: 1960/12/08  Transition of [facility]) CM/SW Contact  Golda Acre, RN Phone Number: 01/05/2021, 11:28 AM  Clinical Narrative:    [facility] short form for physician signature placed on chart.   Expected Discharge Plan: Home w Home Health Services Barriers to Discharge: Continued Medical Work up  Expected Discharge Plan and Services Expected Discharge Plan: Home w Home Health Services   Discharge Planning Services: CM Consult   Living arrangements for the past 2 months: Single Family Home                                       Social Determinants of Health (SDOH) Interventions    Readmission Risk Interventions No flowsheet data found.

## 2021-01-05 NOTE — Progress Notes (Signed)
Inpatient Diabetes Program Recommendations  AACE/ADA: New Consensus Statement on Inpatient Glycemic Control (2015)  Target Ranges:  Prepandial:   less than 140 mg/dL      Peak postprandial:   less than 180 mg/dL (1-2 hours)      Critically ill patients:  140 - 180 mg/dL   Lab Results  Component Value Date   GLUCAP 364 (H) 01/05/2021   HGBA1C 12.8 (H) 01/03/2021   Results for JALEXA, PIFER (MRN [id number]) as of 01/05/2021 10:01  Ref. Range 01/04/2021 16:09 01/04/2021 17:20 01/04/2021 21:23 01/05/2021 07:50 01/05/2021 08:13  Glucose-Capillary Latest Ref Range: 70 - 99 mg/dL 160 (H) 737 (H) 106 (H) 329 (H) 364 (H)   Review of Glycemic Control  Diabetes history: type 2 Outpatient Diabetes medications: Levemir 45 units daily, Metformin 1000 mg BID Current orders for Inpatient glycemic control: Lantus 35 units daily, Novolog RESISTANT correction scale TID & HS scale   Inpatient Diabetes Program Recommendations:   Noted that blood sugars continue to be greater than 300 mg/dl. Noted that patient received Decadron 5 mg yesterday in surgery.   Recommend increasing Lantus to 45 units daily. Patient does take Levemir 45 units daily at home. Continue Novolog RESISTANT correction scale as ordered if patient is eating. Noted that hgbA1C is 12.8%.   Will continue to monitor blood sugars while in the hospital.  Smith Mince RN BSN CDE Diabetes Coordinator Pager: [phone]  8am-5pm

## 2021-01-05 NOTE — Evaluation (Signed)
Physical Therapy Evaluation Patient Details Name: Erin Davila MRN: [id number] DOB: 07-15-1960 Today's Date: 01/05/2021   History of Present Illness  60 year old with infected L buttocks absess with I& D 01/04/2021. PMH: DM and HTN  Clinical Impression  PT very pleasant and motivated, however when she sat edge of bed and then stood pain took over and she became dizzy and flush feeling. Only able to stand 2 times at bedside today and sidestep to the head of the bed. Pt will likely move very well once the pain is under control due to being very independent prior to admission. Encourage nursing to continue to mobilize and assess  patients ability, and PT will continue to see pt while here in hospital to make sure pt is progressing.     Follow Up Recommendations No PT follow up    Equipment Recommendations  None recommended by PT    Recommendations for Other Services       Precautions / Restrictions Precautions Precautions: None Precaution Comments: wound vac in place on L buttocks      Mobility  Bed Mobility Overal bed mobility: Needs Assistance Bed Mobility: Supine to Sit;Sit to Supine     Supine to sit: Mod assist Sit to supine: Mod assist   General bed mobility comments: assistance mainly needed due to pain and where VAC and wound is located and very sore to move supine to sit    Transfers   Equipment used: 2 person hand held assist             General transfer comment: stood pulling up on son and PT's hands to come to standing and stood 2times at bedside  Ambulation/Gait Ambulation/Gait assistance: +2 physical assistance;Min assist Gait Distance (Feet): 4 Feet (side stepped at edge of bed closer to head of bed.) Assistive device: 2 person hand held assist Gait Pattern/deviations: Step-to pattern        Stairs            Wheelchair Mobility    Modified Rankin (Stroke Patients Only)       Balance                                              Pertinent Vitals/Pain Pain Assessment: 0-10 Pain Score: 9  Pain Location: once we starting moving and sat EOB and stood pt with a lot of pain Pain Descriptors / Indicators: Burning;Aching;Sore Pain Intervention(s): Limited activity within patient's tolerance;Monitored during session    Home Living Family/patient expects to be discharged to:: Private residence Living Arrangements: Alone Available Help at Discharge: Family Type of Home: House Home Access: Stairs to enter   Secretary/administrator of Steps: 2 Home Layout: One level        Prior Function Level of Independence: Independent         Comments: leads the choir, very active and independent, drives and  works     Higher education careers adviser        Extremity/Trunk Assessment        Lower Extremity Assessment Lower Extremity Assessment: Overall WFL for tasks assessed       Communication   Communication: No difficulties  Cognition Arousal/Alertness: Awake/alert Behavior During Therapy: WFL for tasks assessed/performed Overall Cognitive Status: Within Functional Limits for tasks assessed  General Comments      Exercises     Assessment/Plan    PT Assessment Patient needs continued PT services  PT Problem List Decreased activity tolerance;Decreased mobility       PT Treatment Interventions Gait training;Functional mobility training;Therapeutic activities    PT Goals (Current goals can be found in the Care Plan section)  Acute Rehab PT Goals Patient Stated Goal: I have got plan in 2 weeks I have to be better PT Goal Formulation: With patient Time For Goal Achievement: 01/19/21 Potential to Achieve Goals: Good    Frequency Min 3X/week   Barriers to discharge        Co-evaluation               AM-PAC PT "6 Clicks" Mobility  Outcome Measure Help needed turning from your back to your side while in a flat bed without using  bedrails?: A Little Help needed moving from lying on your back to sitting on the side of a flat bed without using bedrails?: A Lot Help needed moving to and from a bed to a chair (including a wheelchair)?: A Lot Help needed standing up from a chair using your arms (e.g., wheelchair or bedside chair)?: A Lot Help needed to walk in hospital room?: A Lot Help needed climbing 3-5 steps with a railing? : A Lot 6 Click Score: 13    End of Session   Activity Tolerance: Patient limited by pain Patient left: in bed;with call bell/phone within reach;with family/visitor present;with nursing/sitter in room Nurse Communication: Mobility status PT Visit Diagnosis: Other abnormalities of gait and mobility (R26.89);Pain Pain - part of body:  (buttocks)    Time: 1530-1600 PT Time Calculation (min) (ACUTE ONLY): 30 min   Charges:   PT Evaluation $PT Eval Low Complexity: 1 Low PT Treatments $Therapeutic Activity: 8-22 mins        Haleema Vanderheyden, PT, MPT Acute Rehabilitation Services Office: [phone] Pager: [phone] 01/05/2021   Commodore Bellew, Clois Dupes 01/05/2021, 6:04 PM

## 2021-01-06 LAB — BASIC METABOLIC PANEL
Anion gap: 12 (ref 5–15)
BUN: 72 mg/dL — ABNORMAL HIGH (ref 6–20)
CO2: 25 mmol/L (ref 22–32)
Calcium: 8.5 mg/dL — ABNORMAL LOW (ref 8.9–10.3)
Chloride: 102 mmol/L (ref 98–111)
Creatinine, Ser: 4.72 mg/dL — ABNORMAL HIGH (ref 0.44–1.00)
GFR, Estimated: 10 mL/min — ABNORMAL LOW (ref >60)
Glucose, Bld: 136 mg/dL — ABNORMAL HIGH (ref 70–99)
Potassium: 4 mmol/L (ref 3.5–5.1)
Sodium: 139 mmol/L (ref 135–145)

## 2021-01-06 LAB — MAGNESIUM: Magnesium: 2.1 mg/dL (ref 1.7–2.4)

## 2021-01-06 LAB — CBC
HCT: 31.7 % — ABNORMAL LOW (ref 36.0–46.0)
Hemoglobin: 9.8 g/dL — ABNORMAL LOW (ref 12.0–15.0)
MCH: 30.6 pg (ref 26.0–34.0)
MCHC: 30.9 g/dL (ref 30.0–36.0)
MCV: 99.1 fL (ref 80.0–100.0)
Platelets: 376 10*3/uL (ref 150–400)
RBC: 3.2 MIL/uL — ABNORMAL LOW (ref 3.87–5.11)
RDW: 13.1 % (ref 11.5–15.5)
WBC: 14.8 10*3/uL — ABNORMAL HIGH (ref 4.0–10.5)
nRBC: 0 % (ref 0.0–0.2)

## 2021-01-06 LAB — GLUCOSE, CAPILLARY
Glucose-Capillary: 155 mg/dL — ABNORMAL HIGH (ref 70–99)
Glucose-Capillary: 212 mg/dL — ABNORMAL HIGH (ref 70–99)
Glucose-Capillary: 189 mg/dL — ABNORMAL HIGH (ref 70–99)
Glucose-Capillary: 222 mg/dL — ABNORMAL HIGH (ref 70–99)

## 2021-01-06 LAB — PHOSPHORUS: Phosphorus: 4.4 mg/dL (ref 2.5–4.6)

## 2021-01-06 MED ORDER — DEXTROSE IN LACTATED RINGERS 5 % IV SOLN: INTRAVENOUS | Status: DC

## 2021-01-06 NOTE — Progress Notes (Signed)
Subjective No acute events. Wound see yesterday at bedside with nursing [facility] dressing). She has done well, no complaints.  Objective: Vital signs in last 24 hours: Temp:  [98.3 F (36.8 C)-99 F (37.2 C)] 99 F (37.2 C) (07/03 0538) Pulse Rate:  [81-91] 91 (07/03 0538) Resp:  [16-18] 16 (07/03 0538) BP: (105-135)/(29-62) 135/54 (07/03 0538) SpO2:  [96 %-100 %] 98 % (07/03 0538) Last BM Date: 01/03/21  Intake/Output from previous day: 07/02 0701 - 07/03 0700 In: 320 [P.O.:320] Out: 2075 [Urine:2000; Drains:75] Intake/Output this shift: No intake/output data recorded.  Gen: NAD, comfortable, laying prone today CV: RRR Pulm: Normal work of breathing Back: VAC in place; no surrounding cellulitis; ss fluid in cannister; no frank purulence Ext: SCDs in place  Lab Results: CBC  Recent Labs    01/05/21 0543 01/06/21 0529  WBC 17.2* 14.8*  HGB 9.7* 9.8*  HCT 30.8* 31.7*  PLT 364 376   BMET Recent Labs    01/05/21 0543 01/06/21 0529  NA 131* 139  K 3.8 4.0  CL 95* 102  CO2 24 25  GLUCOSE 410* 136*  BUN 79* 72*  CREATININE 4.91* 4.72*  CALCIUM 8.2* 8.5*   PT/INR No results for input(s): LABPROT, INR in the last 72 hours. ABG No results for input(s): PHART, HCO3 in the last 72 hours.  Invalid input(s): PCO2, PO2  Studies/Results:  Anti-infectives: Anti-infectives (From admission, onward)    Start     Dose/Rate Route Frequency Ordered Stop   01/03/21 2000  piperacillin-tazobactam (ZOSYN) IVPB 3.375 g        3.375 g 12.5 mL/hr over 240 Minutes Intravenous Every 8 hours 01/03/21 1908          Assessment/Plan: Patient Active Problem List   Diagnosis Date Noted   Left buttock abscess 01/04/2021   Cellulitis and abscess of buttock 01/03/2021   Hyperglycemia due to diabetes mellitus (HCC) 01/03/2021   Trochanteric bursitis of both hips 02/01/2015   Diabetes mellitus type 2 in obese (HCC) 07/31/2014   HTN (hypertension) 07/31/2014   Morbid obesity (HCC)  07/31/2014   s/p Procedure(s): INCISION AND DRAINAGE LEFT BUTTOCK ABSCESS 01/04/2021  -Wound VAC change today vs tomorrow - we have again placed WOCN consult   LOS: 3 days   Marin Olp, MD [facility] Use [email or web address] to contact on call provider

## 2021-01-06 NOTE — Progress Notes (Signed)
PROGRESS NOTE    Erin Davila  GYF:[id number] DOB: 04/28/1961 DOA: 01/03/2021 PCP: [name], MD    Brief Narrative:  This 60 year old female with history of insulin-dependent diabetes type 2, hypertension, morbid obesity who presented as direct admission from general surgery outpatient office for evaluation of necrotizing wound on the left buttock.  We were consulted for the management of hyperglycemia /AKI.  She recently had incision and drainage for her left gluteal abscess and was taking antibiotics at home. On presentation to the office at general surgery, wound was found to be necrotic with persistent drainage and therefore she was sent for direct admission.  On presentation she was severely hyperglycemic, lab work showed severe AKI with creatinine in the range of 4, no history of CKD.  Assessment & Plan:   Principal Problem:   Diabetes mellitus type 2 in obese [facility]) Active Problems:   HTN (hypertension)   Morbid obesity (HCC)   Cellulitis and abscess of buttock   Hyperglycemia due to diabetes mellitus (HCC)   Left buttock abscess   Diabetes type 2/ Hyperglycemia:  She takes 45 units Levemir insulin at home. She presented with severe hyperglycemia.   She was started on insulin drip initially, changed to Lantus and sliding scale.  Monitor blood sugars.   Hb A1c 12.5.  Increase Levemir to 45 units daily, change sliding scale to resistant. Diabetic consult appreciated.  Blood glucose improved.   AKI:  Creatinine in the range of 4 on presentation.  She takes olmesartan at home.  No history of CKD.   AKI is most likely prerenal secondary to polyuria causing volume depletion.   Renal Ultrasound did not show any obstruction.  Urine electrolytes ordered, not collected yet.   Continue IV fluids,  monitoring kidney function.  Avoid nephrotoxins. Continue foley catheter. Renal functions not improving, nephrology consulted.  Continue IV hydration.  Patient is making urine.   Severe  sepsis/septic shock:  She presented with severe leukocytosis, hypotension.  Continue IV fluids.  Blood pressure is better now.   Necrotizing left gluteal wound:  Foul smell noted this mrng. Management as per general surgery.  Continue antibiotics, follow cultures.   Hypertension: She takes olmesartan.  Currently on hold due to AKI.  Monitor blood pressure.     Morbid obesity: BMI of 57. Diet counseling completed.   Hypokalemia: Replaced . Continue to monitor     Subjective: Patient was seen and examined at bedside,  overnight events noted.  She reports feeling better.  Objective: Vitals:   01/05/21 2121 01/06/21 0147 01/06/21 0538 01/06/21 1236  BP: (!) 131/54 (!) 116/47 (!) 135/54   Pulse: 89 87 91   Resp: 18 16 16    Temp: 98.4 F (36.9 C) 98.6 F (37 C) 99 F (37.2 C)   TempSrc: Oral Oral Oral   SpO2: 96% 97% 98%   Weight:    (!) 173.7 kg  Height:        Intake/Output Summary (Last 24 hours) at 01/06/2021 1414 Last data filed at 01/06/2021 1300 Gross per 24 hour  Intake 440 ml  Output 2975 ml  Net -2535 ml    Filed Weights   01/03/21 1755 01/06/21 1236  Weight: (!) 172.4 kg (!) 173.7 kg    Examination:  General exam: Appears calm and comfortable, not in any acute distress. Respiratory system: Clear to auscultation. Respiratory effort normal. Cardiovascular system: S1 & S2 heard, RRR. No JVD, murmurs, rubs, gallops or clicks. No pedal edema. Gastrointestinal system: Abdomen is  nondistended, soft and nontender. No organomegaly or masses felt. Normal bowel sounds heard. Central nervous system: Alert and oriented. No focal neurological deficits. Extremities: Symmetric 5 x 5 power. Skin: There is foul-smelling wound noted on left buttock. Psychiatry: Judgement and insight appear normal. Mood & affect appropriate.     Data Reviewed: I have personally reviewed following labs and imaging studies  CBC: Recent Labs  Lab 01/03/21 2021 01/04/21 0228 01/05/21 0543  01/06/21 0529  WBC 15.5* 14.3* 17.2* 14.8*  NEUTROABS 13.0*  --  14.3*  --   HGB 11.0* 11.4* 9.7* 9.8*  HCT 34.3* 35.9* 30.8* 31.7*  MCV 95.5 95.7 97.2 99.1  PLT 376 410* 364 376    Basic Metabolic Panel: Recent Labs  Lab 01/04/21 0228 01/04/21 0543 01/04/21 0957 01/05/21 0543 01/06/21 0529  NA 137 137 138 131* 139  K 3.4* 3.3* 3.4* 3.8 4.0  CL 97* 97* 99 95* 102  CO2 23 23 27 24 25   GLUCOSE 303* 266* 162* 410* 136*  BUN 74* 74* 76* 79* 72*  CREATININE 4.53* 4.54* 4.75* 4.91* 4.72*  CALCIUM 9.0 9.0 9.0 8.2* 8.5*  MG  --   --   --   --  2.1  PHOS  --   --   --   --  4.4    GFR: Estimated Creatinine Clearance: 22.1 mL/min (A) (by C-G formula based on SCr of 4.72 mg/dL (H)). Liver Function Tests: Recent Labs  Lab 01/03/21 2021  AST 11*  ALT 15  ALKPHOS 188*  BILITOT 0.8  PROT 8.2*  ALBUMIN 2.7*    No results for input(s): LIPASE, AMYLASE in the last 168 hours. No results for input(s): AMMONIA in the last 168 hours. Coagulation Profile: No results for input(s): INR, PROTIME in the last 168 hours. Cardiac Enzymes: No results for input(s): CKTOTAL, CKMB, CKMBINDEX, TROPONINI in the last 168 hours. BNP (last 3 results) No results for input(s): PROBNP in the last 8760 hours. HbA1C: Recent Labs    01/03/21 2021  HGBA1C 12.8*    CBG: Recent Labs  Lab 01/05/21 1157 01/05/21 1633 01/05/21 2122 01/06/21 0759 01/06/21 1157  GLUCAP 266* 101* 81 155* 212*    Lipid Profile: No results for input(s): CHOL, HDL, LDLCALC, TRIG, CHOLHDL, LDLDIRECT in the last 72 hours. Thyroid Function Tests: No results for input(s): TSH, T4TOTAL, FREET4, T3FREE, THYROIDAB in the last 72 hours. Anemia Panel: No results for input(s): VITAMINB12, FOLATE, FERRITIN, TIBC, IRON, RETICCTPCT in the last 72 hours. Sepsis Labs: No results for input(s): PROCALCITON, LATICACIDVEN in the last 168 hours.  Recent Results (from the past 240 hour(s))  Surgical PCR screen     Status: None    Collection Time: 01/03/21  9:00 PM   Specimen: Nasal Mucosa; Nasal Swab  Result Value Ref Range Status   MRSA, PCR NEGATIVE NEGATIVE Final   Staphylococcus aureus NEGATIVE NEGATIVE Final    Comment: (NOTE) The Xpert SA Assay (FDA approved for NASAL specimens in patients 32 years of age and older), is one component of a comprehensive surveillance program. It is not intended to diagnose infection nor to guide or monitor treatment. Performed at [facility], [address] [place], [place]   SARS Coronavirus 2 by RT PCR (hospital order, performed in [facility] hospital lab) Nasopharyngeal Nasal Mucosa     Status: None   Collection Time: 01/03/21  9:13 PM   Specimen: Nasal Mucosa; Nasopharyngeal  Result Value Ref Range Status   SARS Coronavirus 2  NEGATIVE NEGATIVE Final    Comment: (NOTE) SARS-CoV-2 target nucleic acids are NOT DETECTED.  The SARS-CoV-2 RNA is generally detectable in upper and lower respiratory specimens during the acute phase of infection. The lowest concentration of SARS-CoV-2 viral copies this assay can detect is 250 copies / mL. A negative result does not preclude SARS-CoV-2 infection and should not be used as the sole basis for treatment or other patient management decisions.  A negative result may occur with improper specimen collection / handling, submission of specimen other than nasopharyngeal swab, presence of viral mutation(s) within the areas targeted by this assay, and inadequate number of viral copies (<250 copies / mL). A negative result must be combined with clinical observations, patient history, and epidemiological information.  Fact Sheet for Patients:   BoilerBrush.com.cy  Fact Sheet for Healthcare Providers: https://pope.com/  This test is not yet approved or  cleared by the [place] FDA and has been authorized for detection and/or diagnosis of SARS-CoV-2  by FDA under an Emergency Use Authorization (EUA).  This EUA will remain in effect (meaning this test can be used) for the duration of the COVID-19 declaration under Section 564(b)(1) of the Act, 21 U.S.C. section 360bbb-3(b)(1), unless the authorization is terminated or revoked sooner.  Performed at [facility], [address] [place], [place] [address]       Radiology Studies: No results found.  Scheduled Meds:  Chlorhexidine Gluconate Cloth  6 each Topical Daily   insulin aspart  0-20 Units Subcutaneous TID WC   insulin aspart  0-5 Units Subcutaneous QHS   insulin glargine  35 Units Subcutaneous Daily   mouth rinse  15 mL Mouth Rinse BID   Continuous Infusions:  sodium chloride 250 mL (01/05/21 0310)   sodium chloride Stopped (01/04/21 1107)   dextrose 5% lactated ringers 125 mL/hr at 01/06/21 0515   piperacillin-tazobactam (ZOSYN)  IV 3.375 g (01/06/21 1231)     LOS: 3 days    Time spent: 25 mins    Dreshaun Stene, MD [facility]   If 7PM-7AM, please contact night-coverage

## 2021-01-06 NOTE — Consult Note (Signed)
Renal Service Consult Note [facility]  Erin Davila 01/06/2021 [name], MD Requesting Physician: Dr. Cliffton Asters, C.   Reason for Consult: Renal failure HPI: The patient is a 60 y.o. year-old w/ hx of DM2 on insulin, HTN, HL was admitted on 6/30 for buttock wound not responding to outpatient abx and I&D w/ dressing changes. She underwent I&D 7/01 in the OR by gen surgery. Her creat on admit was 4, no hx of renal failure. Asked to see for renal failure.    Pt seen in room. She is feeling much better since her surgery here. She has a foley cath in. No SOB, no c/o leg or UE swelling.    Home meds include metformin, olmesartan/ hctz, levemir, zocor, ultram and singulair and celebrex.    ROS - denies CP, no joint pain, no HA, no blurry vision, no rash, no diarrhea, no nausea/ vomiting, no dysuria, no difficulty voiding   Past Medical History  Past Medical History:  Diagnosis Date   Allergy    Diabetes mellitus without complication (HCC)    Hyperlipidemia    Hypertension    Past Surgical History  Past Surgical History:  Procedure Laterality Date   CESAREAN SECTION     Family History History reviewed. No pertinent family history. Social History  reports that she has never smoked. She has never used smokeless tobacco. She reports that she does not drink alcohol and does not use drugs. Allergies  Allergies  Allergen Reactions   Lactose Other (See Comments)    GI complaints   Home medications Prior to Admission medications   Medication Sig Start Date End Date Taking? Authorizing Provider  celecoxib (CELEBREX) 200 MG capsule Take 200 mg by mouth daily.   Yes [provider]  LEVEMIR FLEXTOUCH 100 UNIT/ML FlexPen Inject 45 Units into the skin at bedtime. 11/12/20  Yes [provider]  metFORMIN (GLUCOPHAGE) 1000 MG tablet Take 1,000 mg by mouth 2 (two) times daily. 08/30/20  Yes [provider]  montelukast (SINGULAIR) 10 MG tablet Take  10 mg by mouth at bedtime.   Yes [provider]  Multiple Vitamin (MULTIVITAMIN) tablet Take 1 tablet by mouth daily.   Yes [provider]  olmesartan-hydrochlorothiazide (BENICAR HCT) 40-25 MG per tablet Take 1 tablet by mouth daily.   Yes [provider]  simvastatin (ZOCOR) 40 MG tablet Take 40 mg by mouth at bedtime.   Yes [provider]  diclofenac (VOLTAREN) 75 MG EC tablet TAKE 1 TABLET (75 MG TOTAL) BY MOUTH 2 (TWO) TIMES DAILY. Patient not taking: Reported on 01/05/2021 10/03/15   Peyton Najjar, MD  traMADol (ULTRAM) 50 MG tablet Take 1 tablet (50 mg total) by mouth every 8 (eight) hours as needed. Patient not taking: No sig reported 07/31/14   Copland, Gwenlyn Found, MD  traMADol (ULTRAM) 50 MG tablet Take 1 tablet (50 mg total) by mouth every 8 (eight) hours as needed. PATIENT NEEDS OFFICE VISIT FOR ADDITIONAL REFILLS Patient not taking: No sig reported 04/19/15   Peyton Najjar, MD     Vitals:   01/05/21 1405 01/05/21 2121 01/06/21 0147 01/06/21 0538  BP: 118/62 (!) 131/54 (!) 116/47 (!) 135/54  Pulse: 81 89 87 91  Resp: 18 18 16 16   Temp: 98.7 F (37.1 C) 98.4 F (36.9 C) 98.6 F (37 C) 99 F (37.2 C)  TempSrc: Oral Oral Oral Oral  SpO2: 100% 96% 97% 98%  Weight:      Height:  Exam Gen alert, no distress No rash, cyanosis or gangrene Sclera anicteric, throat clear  No jvd or bruits Chest clear bilat to bases, no rales/ wheezing RRR no MRG Abd soft ntnd no mass or ascites +bs GU foley cath draining clear light yellow urine MS no joint effusions or deformity Ext no LE or UE edema, no wounds or ulcers Neuro is alert, Ox 3 , nf     UA  - 30 prot, 0-5 rbc/ wbc     UNa 30,  UCr 92      Renal US - 10- 11 cm kidneys, no hydro   Assessment/ Plan: AKI - in patient w/ no prior labs here.  Admitted for nonhealing wound w/ creat 4.4 on 6/30.  Was having poor po intake and was taking COX II agent, ARB and diuretic at home. UA neg,  urine lytes c/w prerenal. Suspect AKI due to hypovolemia + transient hypotension + ARB + COX II effects. She is making urine, creat down slightly today at 4.7, down from 4.9 yesterday. BP's are wnl now. No vol excess on exam. Would continue IVF"s at 125 cc/hr for now, follow up labs in am. SP I&D necrotizing L gluteal wound - on 7/01, on zosyn IV.  Hypotension/ sepsis - resolved IDDM HTN - avoid all ARB/ ACEi, no need for BP lowering meds at this time.       Vinson Moselle  MD 01/06/2021, 12:22 PM  Recent Labs  Lab 01/05/21 0543 01/06/21 0529  WBC 17.2* 14.8*  HGB 9.7* 9.8*   Recent Labs  Lab 01/05/21 0543 01/06/21 0529  K 3.8 4.0  BUN 79* 72*  CREATININE 4.91* 4.72*  CALCIUM 8.2* 8.5*  PHOS  --  4.4

## 2021-01-06 NOTE — Consult Note (Signed)
WOC Nurse Consult Note: Reason for Consult:NPWT dressing change Wound type: Surgical  I have communicated with the patient's bedside RN Rexene Edison. Hunt) and Surgeon (Dr. Esmond Harps) to let them know we are not on that campus/working on the weekends, so if the [facility] Nurse is needed to change the dressing, it will not be done until Monday, 7/4.  Alternatively, they are informed that several bedside nurses on that unit have demonstrated competency in changing NPWT dressings  so if a dressing change is required today, it can certainly be accomplished.  Dr. Cliffton Asters has responded that a dressing change tomorrow is sufficient.  WOC nursing team will see tomorrow for NPT Dressing change, and will remain available to this patient, the nursing and medical teams.   Thanks, Ladona Mow, MSN, RN, GNP, [place]  Pager# [phone]

## 2021-01-06 NOTE — Progress Notes (Signed)
Pharmacy Antibiotic Note  Erin Davila is a 60 y.o. female admitted on 01/03/2021 with necrotizing draining wound infection of left buttock.  Pharmacy has been consulted for Zosyn dosing.  Today, 01/06/21 WBC remains slightly elevated. Dexamethasone x1 dose given on 7/1 SCr 4.72, CrCl ~ 22 mL/min. AKI, nephrology following  Today is day #4 IV antibiotics. S/p I&D on 7/1 with wound vac  Plan: Continue piperacillin/tazobactam 3.375 g IV q8h EI Monitor renal function for necessary dose adjustments   Height: 5\' 8"  (172.7 cm) Weight: (!) 172.4 kg (380 lb) IBW/kg (Calculated) : 63.9  Temp (24hrs), Avg:98.7 F (37.1 C), Min:98.4 F (36.9 C), Max:99 F (37.2 C)  Recent Labs  Lab 01/03/21 2021 01/04/21 0228 01/04/21 0543 01/04/21 0957 01/05/21 0543 01/06/21 0529  WBC 15.5* 14.3*  --   --  17.2* 14.8*  CREATININE 4.47* 4.53* 4.54* 4.75* 4.91* 4.72*     Estimated Creatinine Clearance: 22 mL/min (A) (by C-G formula based on SCr of 4.72 mg/dL (H)).    Allergies  Allergen Reactions   Lactose Other (See Comments)    GI complaints     Thank you for allowing pharmacy to be a part of this patient's care.  03/09/21, PharmD 01/06/21 12:44 PM

## 2021-01-07 LAB — RENAL FUNCTION PANEL
Albumin: 2.2 g/dL — ABNORMAL LOW (ref 3.5–5.0)
Anion gap: 10 (ref 5–15)
BUN: 54 mg/dL — ABNORMAL HIGH (ref 6–20)
CO2: 27 mmol/L (ref 22–32)
Calcium: 8.2 mg/dL — ABNORMAL LOW (ref 8.9–10.3)
Chloride: 103 mmol/L (ref 98–111)
Creatinine, Ser: 3.91 mg/dL — ABNORMAL HIGH (ref 0.44–1.00)
GFR, Estimated: 13 mL/min — ABNORMAL LOW (ref >60)
Glucose, Bld: 229 mg/dL — ABNORMAL HIGH (ref 70–99)
Phosphorus: 3.8 mg/dL (ref 2.5–4.6)
Potassium: 3.8 mmol/L (ref 3.5–5.1)
Sodium: 140 mmol/L (ref 135–145)

## 2021-01-07 LAB — GLUCOSE, CAPILLARY
Glucose-Capillary: 193 mg/dL — ABNORMAL HIGH (ref 70–99)
Glucose-Capillary: 232 mg/dL — ABNORMAL HIGH (ref 70–99)
Glucose-Capillary: 262 mg/dL — ABNORMAL HIGH (ref 70–99)
Glucose-Capillary: 160 mg/dL — ABNORMAL HIGH (ref 70–99)
Glucose-Capillary: 160 mg/dL — ABNORMAL HIGH (ref 70–99)

## 2021-01-07 MED ORDER — INSULIN GLARGINE 100 UNIT/ML ~~LOC~~ SOLN
45.0000 [IU] | Freq: Every day | SUBCUTANEOUS | Status: DC
  Administered 2021-01-08 – 2021-01-10 (×3): 45 [IU] via SUBCUTANEOUS
  Filled 2021-01-07 (×3): qty 0.45

## 2021-01-07 MED ORDER — INSULIN GLARGINE 100 UNIT/ML ~~LOC~~ SOLN: 38.0000 [IU] | Freq: Every day | SUBCUTANEOUS | Status: DC

## 2021-01-07 NOTE — Anesthesia Postprocedure Evaluation (Signed)
Anesthesia Post Note  Patient: Erin Davila  Procedure(s) Performed: INCISION AND DRAINAGE LEFT BUTTOCK ABSCESS     Patient location during evaluation: PACU Anesthesia Type: General Level of consciousness: awake and alert Pain management: pain level controlled Vital Signs Assessment: post-procedure vital signs reviewed and stable Respiratory status: spontaneous breathing, nonlabored ventilation, respiratory function stable and patient connected to nasal cannula oxygen Cardiovascular status: blood pressure returned to baseline and stable Postop Assessment: no apparent nausea or vomiting Anesthetic complications: no   No notable events documented.  Last Vitals:  Vitals:   01/07/21 0440 01/07/21 1420  BP: (!) 130/43 (!) 112/51  Pulse: 81 89  Resp: 18 18  Temp: 36.6 C 36.7 C  SpO2: 98% 97%    Last Pain:  Vitals:   01/07/21 1420  TempSrc: Oral  PainSc:                  Shriyans Kuenzi

## 2021-01-07 NOTE — Progress Notes (Signed)
Subjective No acute events. She has no complaints aside from her vac leaking some overnight - required reinforcement by nursing.  Objective: Vital signs in last 24 hours: Temp:  [97.9 F (36.6 C)-98.6 F (37 C)] 97.9 F (36.6 C) (07/04 0440) Pulse Rate:  [81-87] 81 (07/04 0440) Resp:  [17-18] 18 (07/04 0440) BP: (124-138)/(43-68) 130/43 (07/04 0440) SpO2:  [98 %-100 %] 98 % (07/04 0440) Weight:  [173.7 kg-178.9 kg] 178.9 kg (07/04 0352) Last BM Date: 01/06/21  Intake/Output from previous day: 07/03 0701 - 07/04 0700 In: 1480 [P.O.:480; I.V.:1000] Out: 900 [Urine:900] Intake/Output this shift: No intake/output data recorded.  Gen: NAD, comfortable, laying prone today CV: RRR Pulm: Normal work of breathing Back: VAC in place; no surrounding cellulitis; ss fluid in cannister; no frank purulence Ext: SCDs in place  Lab Results: CBC  Recent Labs    01/05/21 0543 01/06/21 0529  WBC 17.2* 14.8*  HGB 9.7* 9.8*  HCT 30.8* 31.7*  PLT 364 376   BMET Recent Labs    01/06/21 0529 01/07/21 0645  NA 139 140  K 4.0 3.8  CL 102 103  CO2 25 27  GLUCOSE 136* 229*  BUN 72* 54*  CREATININE 4.72* 3.91*  CALCIUM 8.5* 8.2*   PT/INR No results for input(s): LABPROT, INR in the last 72 hours. ABG No results for input(s): PHART, HCO3 in the last 72 hours.  Invalid input(s): PCO2, PO2  Studies/Results:  Anti-infectives: Anti-infectives (From admission, onward)    Start     Dose/Rate Route Frequency Ordered Stop   01/03/21 2000  piperacillin-tazobactam (ZOSYN) IVPB 3.375 g        3.375 g 12.5 mL/hr over 240 Minutes Intravenous Every 8 hours 01/03/21 1908          Assessment/Plan: Patient Active Problem List   Diagnosis Date Noted   Left buttock abscess 01/04/2021   Cellulitis and abscess of buttock 01/03/2021   Hyperglycemia due to diabetes mellitus (HCC) 01/03/2021   Trochanteric bursitis of both hips 02/01/2015   Diabetes mellitus type 2 in obese (HCC)  07/31/2014   HTN (hypertension) 07/31/2014   Morbid obesity (HCC) 07/31/2014   s/p Procedure(s): INCISION AND DRAINAGE LEFT BUTTOCK ABSCESS 01/04/2021  -Wound VAC change today w/ WOCN   LOS: 4 days   Marin Olp, MD [facility] Use [email or web address] to contact on call provider

## 2021-01-07 NOTE — Progress Notes (Signed)
Nephrology Follow-Up Consult note   Assessment/Recommendations: Erin Davila is a/an 60 y.o. female with a past medical history significant for DM2, HTN, HLD, chronic wounds, admitted for chronic wound management and AKI.     Nonoliguric acute kidney injury: Likely multifactorial with dehydration, wounds, recent surgery, NSAID use, and ARB use contributing. Unclear baseline. Improving. -Holding culprit meds -Stop IV fluids and encourage oral hydration -Try to obtain outpatient records tomorrow -Continue to monitor daily Cr, Dose meds for GFR -Monitor Daily I/Os, Daily weight  -Maintain MAP>65 for optimal renal perfusion.  -Avoid nephrotoxic medications including NSAIDs and Vanc/Zosyn combo -Renal ultrasound without major concern -Currently no indication for HD  Chronic wounds: Status post I&D of gluteal wound.  Antibiotics per primary team  Hypotension/sepsis: Resolved.  Antibiotics as above  Uncontrolled Diabetes Mellitus Type 2 with Hyperglycemia: Management per primary  Hypertension: History of this diagnosis.  Blood pressure stable at this time without medications.  Continue to monitor  Recommendations conveyed to primary service.    [name] 01/07/2021 11:19 AM  ___________________________________________________________  CC: AKI  Interval History/Subjective: Patient feels well today.  Did not sleep well last night but feels better today.  No complaints.     Medications:  Current Facility-Administered Medications  Medication Dose Route Frequency Provider Last Rate Last Admin   0.9 %  sodium chloride infusion   Intravenous PRN Jacinto Halim, PA-C 10 mL/hr at 01/05/21 0310 250 mL at 01/05/21 0310   0.9 %  sodium chloride infusion   Intravenous PRN Jacinto Halim, PA-C   Stopped at 01/04/21 1107   acetaminophen (TYLENOL) tablet 650 mg  650 mg Oral Q6H PRN Jacinto Halim, PA-C       Or   acetaminophen (TYLENOL) suppository  650 mg  650 mg Rectal Q6H PRN Maczis, Elmer Sow, PA-C       Chlorhexidine Gluconate Cloth 2 % PADS 6 each  6 each Topical Daily Jacinto Halim, PA-C   6 each at 01/06/21 1027   dextrose 5 % in lactated ringers infusion   Intravenous Continuous Kathlen Mody, MD 125 mL/hr at 01/07/21 0440 New Bag at 01/07/21 0440   dextrose 50 % solution 0-50 mL  0-50 mL Intravenous PRN Jacinto Halim, PA-C       diphenhydrAMINE (BENADRYL) capsule 25 mg  25 mg Oral Q6H PRN Jacinto Halim, PA-C       Or   diphenhydrAMINE (BENADRYL) injection 25 mg  25 mg Intravenous Q6H PRN Maczis, Elmer Sow, PA-C       hydrALAZINE (APRESOLINE) injection 10 mg  10 mg Intravenous Q2H PRN Maczis, Elmer Sow, PA-C       hyoscyamine (LEVSIN) 0.125 MG/5ML elixir 0.25 mg  0.25 mg Oral Q6H PRN Jacinto Halim, PA-C   0.25 mg at 01/04/21 9735   insulin aspart (novoLOG) injection 0-20 Units  0-20 Units Subcutaneous TID WC Jacinto Halim, PA-C   4 Units at 01/07/21 0810   insulin aspart (novoLOG) injection 0-5 Units  0-5 Units Subcutaneous QHS Jacinto Halim, PA-C   2 Units at 01/06/21 2135   insulin glargine (LANTUS) injection 35 Units  35 Units Subcutaneous Daily Jacinto Halim, [place]   35 Units at 01/07/21 1021   lip balm (CARMEX) ointment   Topical PRN Jacinto Halim, PA-C   Given at 01/03/21 2354   MEDLINE mouth rinse  15 mL Mouth Rinse BID Jacinto Halim, PA-C   15 mL at  01/07/21 1021   methocarbamol (ROBAXIN) tablet 500 mg  500 mg Oral Q6H PRN Jacinto Halim, PA-C   500 mg at 01/05/21 2311   morphine 2 MG/ML injection 2 mg  2 mg Intravenous Q2H PRN Jacinto Halim, PA-C   2 mg at 01/07/21 1019   ondansetron (ZOFRAN-ODT) disintegrating tablet 4 mg  4 mg Oral Q6H PRN Jacinto Halim, PA-C       Or   ondansetron [facility]) injection 4 mg  4 mg Intravenous Q6H PRN Jacinto Halim, PA-C   4 mg at 01/06/21 1224   oxyCODONE (Oxy IR/ROXICODONE) immediate release tablet 5-10 mg  5-10 mg Oral Q4H PRN Jacinto Halim, PA-C   10 mg at 01/05/21 1634   piperacillin-tazobactam (ZOSYN) IVPB 3.375 g  3.375 g Intravenous Q8H Jacinto Halim, PA-C 12.5 mL/hr at 01/07/21 0809 Restarted at 01/07/21 0809      Review of Systems: 10 systems reviewed and negative except per interval history/subjective  Physical Exam: Vitals:   01/06/21 2147 01/07/21 0440  BP: 138/68 (!) 130/43  Pulse: 84 81  Resp: 18 18  Temp: 98.6 F (37 C) 97.9 F (36.6 C)  SpO2: 98% 98%   Total I/O In: 805 [P.O.:240; I.V.:540; IV Piggyback:25] Out: 1020 [Urine:1000; Drains:20]  Intake/Output Summary (Last 24 hours) at 01/07/2021 1119 Last data filed at 01/07/2021 0900 Gross per 24 hour  Intake 2165 ml  Output 1920 ml  Net 245 ml   Constitutional: Obese, lying in bed, no distress ENMT: ears and nose without scars or lesions, MMM CV: normal rate, no edema Respiratory: Bilateral chest rise normal work of breathing Gastrointestinal: soft, non-tender, no palpable masses or hernias Skin: no visible lesions or rashes Psych: alert, judgement/insight appropriate, appropriate mood and affect   Test Results I personally reviewed new and old clinical labs and radiology tests Lab Results  Component Value Date   NA 140 01/07/2021   K 3.8 01/07/2021   CL 103 01/07/2021   CO2 27 01/07/2021   BUN 54 (H) 01/07/2021   CREATININE 3.91 (H) 01/07/2021   CALCIUM 8.2 (L) 01/07/2021   ALBUMIN 2.2 (L) 01/07/2021   PHOS 3.8 01/07/2021   Recent Results (from the past 2160 hour(s))  Glucose, capillary     Status: Abnormal   Collection Time: 01/03/21  7:50 PM  Result Value Ref Range   Glucose-Capillary >600 (HH) 70 - 99 mg/dL    Comment: Glucose reference range applies only to samples taken after fasting for at least 8 hours.  Comprehensive metabolic panel     Status: Abnormal   Collection Time: 01/03/21  8:21 PM  Result Value Ref Range   Sodium 129 (L) 135 - 145 mmol/L   Potassium 4.3 3.5 - 5.1 mmol/L   Chloride 92 (L) 98 - 111  mmol/L   CO2 24 22 - 32 mmol/L   Glucose, Bld 685 (HH) 70 - 99 mg/dL    Comment: Glucose reference range applies only to samples taken after fasting for at least 8 hours. CRITICAL RESULT CALLED TO, READ BACK BY AND VERIFIED WITH: MILLER, J. RN@2054  01/03/21 KELLY, J.    BUN 74 (H) 6 - 20 mg/dL   Creatinine, Ser 4.69 (H) 0.44 - 1.00 mg/dL   Calcium 8.7 (L) 8.9 - 10.3 mg/dL   Total Protein 8.2 (H) 6.5 - 8.1 g/dL   Albumin 2.7 (L) 3.5 - 5.0 g/dL   AST 11 (L) 15 - 41 U/L   ALT 15 0 -  44 U/L   Alkaline Phosphatase 188 (H) 38 - 126 U/L   Total Bilirubin 0.8 0.3 - 1.2 mg/dL   GFR, Estimated 11 (L) >60 mL/min    Comment: (NOTE) Calculated using the CKD-EPI Creatinine Equation (2021)    Anion gap 13 5 - 15    Comment: Performed at [facility], [address] [place], [place] [address]  CBC WITH DIFFERENTIAL     Status: Abnormal   Collection Time: 01/03/21  8:21 PM  Result Value Ref Range   WBC 15.5 (H) 4.0 - 10.5 K/uL   RBC 3.59 (L) 3.87 - 5.11 MIL/uL   Hemoglobin 11.0 (L) 12.0 - 15.0 g/dL   HCT 02.7 (L) 74.1 - 28.7 %   MCV 95.5 80.0 - 100.0 fL   MCH 30.6 26.0 - 34.0 pg   MCHC 32.1 30.0 - 36.0 g/dL   RDW 86.7 67.2 - 09.4 %   Platelets 376 150 - 400 K/uL   nRBC 0.0 0.0 - 0.2 %   Neutrophils Relative % 83 %   Neutro Abs 13.0 (H) 1.7 - 7.7 K/uL   Lymphocytes Relative 8 %   Lymphs Abs 1.2 0.7 - 4.0 K/uL   Monocytes Relative 8 %   Monocytes Absolute 1.2 (H) 0.1 - 1.0 K/uL   Eosinophils Relative 0 %   Eosinophils Absolute 0.0 0.0 - 0.5 K/uL   Basophils Relative 0 %   Basophils Absolute 0.0 0.0 - 0.1 K/uL   Immature Granulocytes 1 %   Abs Immature Granulocytes 0.07 0.00 - 0.07 K/uL    Comment: Performed at [facility], [address] [place], [place] [address]  Hemoglobin A1c     Status: Abnormal   Collection Time: 01/03/21  8:21 PM  Result Value Ref Range   Hgb A1c MFr Bld 12.8 (H) 4.8 - 5.6 %    Comment: (NOTE)         Prediabetes: 5.7  - 6.4         Diabetes: >6.4         Glycemic control for adults with diabetes: <7.0    Mean Plasma Glucose 321 mg/dL    Comment: (NOTE) Performed At: [facility] [address] [place], [place] [id number] Jolene Schimke MD LY:[id number]   Surgical PCR screen     Status: None   Collection Time: 01/03/21  9:00 PM   Specimen: Nasal Mucosa; Nasal Swab  Result Value Ref Range   MRSA, PCR NEGATIVE NEGATIVE   Staphylococcus aureus NEGATIVE NEGATIVE    Comment: (NOTE) The Xpert SA Assay (FDA approved for NASAL specimens in patients 8 years of age and older), is one component of a comprehensive surveillance program. It is not intended to diagnose infection nor to guide or monitor treatment. Performed at [facility], [address] [place], [place] [address]   SARS Coronavirus 2 by RT PCR (hospital order, performed in [facility] hospital lab) Nasopharyngeal Nasal Mucosa     Status: None   Collection Time: 01/03/21  9:13 PM   Specimen: Nasal Mucosa; Nasopharyngeal  Result Value Ref Range   SARS Coronavirus 2 NEGATIVE NEGATIVE    Comment: (NOTE) SARS-CoV-2 target nucleic acids are NOT DETECTED.  The SARS-CoV-2 RNA is generally detectable in upper and lower respiratory specimens during the acute phase of infection. The lowest concentration of SARS-CoV-2 viral copies this assay can detect is 250 copies / mL. A negative result does not preclude SARS-CoV-2 infection and should not be used  as the sole basis for treatment or other patient management decisions.  A negative result may occur with improper specimen collection / handling, submission of specimen other than nasopharyngeal swab, presence of viral mutation(s) within the areas targeted by this assay, and inadequate number of viral copies (<250 copies / mL). A negative result must be combined with clinical observations, patient history, and epidemiological information.  Fact Sheet for Patients:    BoilerBrush.com.cy  Fact Sheet for Healthcare Providers: https://pope.com/  This test is not yet approved or  cleared by the [place] FDA and has been authorized for detection and/or diagnosis of SARS-CoV-2 by FDA under an Emergency Use Authorization (EUA).  This EUA will remain in effect (meaning this test can be used) for the duration of the COVID-19 declaration under Section 564(b)(1) of the Act, 21 U.S.C. section 360bbb-3(b)(1), unless the authorization is terminated or revoked sooner.  Performed at [facility], [address] [place], [place] [address]   Glucose, capillary     Status: Abnormal   Collection Time: 01/03/21 11:06 PM  Result Value Ref Range   Glucose-Capillary >600 (HH) 70 - 99 mg/dL    Comment: Glucose reference range applies only to samples taken after fasting for at least 8 hours.  Glucose, capillary     Status: Abnormal   Collection Time: 01/03/21 11:42 PM  Result Value Ref Range   Glucose-Capillary 591 (HH) 70 - 99 mg/dL    Comment: Glucose reference range applies only to samples taken after fasting for at least 8 hours.   Comment 1 Notify RN   Glucose, capillary     Status: Abnormal   Collection Time: 01/04/21 12:21 AM  Result Value Ref Range   Glucose-Capillary 528 (HH) 70 - 99 mg/dL    Comment: Glucose reference range applies only to samples taken after fasting for at least 8 hours.   Comment 1 Notify RN   Glucose, capillary     Status: Abnormal   Collection Time: 01/04/21 12:56 AM  Result Value Ref Range   Glucose-Capillary 452 (H) 70 - 99 mg/dL    Comment: Glucose reference range applies only to samples taken after fasting for at least 8 hours.  Glucose, capillary     Status: Abnormal   Collection Time: 01/04/21  1:28 AM  Result Value Ref Range   Glucose-Capillary 324 (H) 70 - 99 mg/dL    Comment: Glucose reference range applies only to samples taken after fasting for at  least 8 hours.  Basic metabolic panel     Status: Abnormal   Collection Time: 01/04/21  2:28 AM  Result Value Ref Range   Sodium 137 135 - 145 mmol/L    Comment: DELTA CHECK NOTED   Potassium 3.4 (L) 3.5 - 5.1 mmol/L    Comment: DELTA CHECK NOTED   Chloride 97 (L) 98 - 111 mmol/L   CO2 23 22 - 32 mmol/L   Glucose, Bld 303 (H) 70 - 99 mg/dL    Comment: Glucose reference range applies only to samples taken after fasting for at least 8 hours.   BUN 74 (H) 6 - 20 mg/dL   Creatinine, Ser 6.04 (H) 0.44 - 1.00 mg/dL   Calcium 9.0 8.9 - 54.0 mg/dL   GFR, Estimated 11 (L) >60 mL/min    Comment: (NOTE) Calculated using the CKD-EPI Creatinine Equation (2021)    Anion gap 17 (H) 5 - 15    Comment: Performed at [facility], [address] [place],  [place] [address]  CBC     Status: Abnormal   Collection Time: 01/04/21  2:28 AM  Result Value Ref Range   WBC 14.3 (H) 4.0 - 10.5 K/uL   RBC 3.75 (L) 3.87 - 5.11 MIL/uL   Hemoglobin 11.4 (L) 12.0 - 15.0 g/dL   HCT 60.4 (L) 54.0 - 98.1 %   MCV 95.7 80.0 - 100.0 fL   MCH 30.4 26.0 - 34.0 pg   MCHC 31.8 30.0 - 36.0 g/dL   RDW 19.1 47.8 - 29.5 %   Platelets 410 (H) 150 - 400 K/uL   nRBC 0.0 0.0 - 0.2 %    Comment: Performed at [facility], [address] [place], [place] [address]  Glucose, capillary     Status: Abnormal   Collection Time: 01/04/21  2:34 AM  Result Value Ref Range   Glucose-Capillary 219 (H) 70 - 99 mg/dL    Comment: Glucose reference range applies only to samples taken after fasting for at least 8 hours.  Glucose, capillary     Status: Abnormal   Collection Time: 01/04/21  3:41 AM  Result Value Ref Range   Glucose-Capillary 248 (H) 70 - 99 mg/dL    Comment: Glucose reference range applies only to samples taken after fasting for at least 8 hours.  Glucose, capillary     Status: Abnormal   Collection Time: 01/04/21  4:45 AM  Result Value Ref Range   Glucose-Capillary 248 (H) 70 - 99  mg/dL    Comment: Glucose reference range applies only to samples taken after fasting for at least 8 hours.  Basic metabolic panel     Status: Abnormal   Collection Time: 01/04/21  5:43 AM  Result Value Ref Range   Sodium 137 135 - 145 mmol/L   Potassium 3.3 (L) 3.5 - 5.1 mmol/L   Chloride 97 (L) 98 - 111 mmol/L   CO2 23 22 - 32 mmol/L   Glucose, Bld 266 (H) 70 - 99 mg/dL    Comment: Glucose reference range applies only to samples taken after fasting for at least 8 hours.   BUN 74 (H) 6 - 20 mg/dL   Creatinine, Ser 8.65 (H) 0.44 - 1.00 mg/dL   Calcium 9.0 8.9 - 78.4 mg/dL   GFR, Estimated 11 (L) >60 mL/min    Comment: (NOTE) Calculated using the CKD-EPI Creatinine Equation (2021)    Anion gap 17 (H) 5 - 15    Comment: Performed at [facility], [address], [place], [place] [address]  Glucose, capillary     Status: Abnormal   Collection Time: 01/04/21  5:50 AM  Result Value Ref Range   Glucose-Capillary 239 (H) 70 - 99 mg/dL    Comment: Glucose reference range applies only to samples taken after fasting for at least 8 hours.  Glucose, capillary     Status: Abnormal   Collection Time: 01/04/21  6:47 AM  Result Value Ref Range   Glucose-Capillary 211 (H) 70 - 99 mg/dL    Comment: Glucose reference range applies only to samples taken after fasting for at least 8 hours.  Glucose, capillary     Status: Abnormal   Collection Time: 01/04/21  7:54 AM  Result Value Ref Range   Glucose-Capillary 219 (H) 70 - 99 mg/dL    Comment: Glucose reference range applies only to samples taken after fasting for at least 8 hours.  Glucose, capillary     Status: Abnormal   Collection Time: 01/04/21  8:55 AM  Result Value Ref Range   Glucose-Capillary 150 (H) 70 - 99 mg/dL    Comment: Glucose reference range applies only to samples taken after fasting for at least 8 hours.   Comment 1 Notify RN    Comment 2 Document in Chart   Sodium, urine, random     Status: None    Collection Time: 01/04/21  9:36 AM  Result Value Ref Range   Sodium, Ur 30 mmol/L    Comment: Performed at [facility], [address] [place], [place] [id number]  Creatinine, urine, random     Status: None   Collection Time: 01/04/21  9:36 AM  Result Value Ref Range   Creatinine, Urine 91.66 mg/dL    Comment: Performed at [facility], [address] [place], [place] [id number]  Urinalysis, Routine w reflex microscopic Urine, Catheterized     Status: Abnormal   Collection Time: 01/04/21  9:36 AM  Result Value Ref Range   Color, Urine YELLOW YELLOW   APPearance CLEAR CLEAR   Specific Gravity, Urine 1.012 1.005 - 1.030   pH 5.0 5.0 - 8.0   Glucose, UA >=500 (A) NEGATIVE mg/dL   Hgb urine dipstick NEGATIVE NEGATIVE   Bilirubin Urine NEGATIVE NEGATIVE   Ketones, ur NEGATIVE NEGATIVE mg/dL   Protein, ur 30 (A) NEGATIVE mg/dL   Nitrite NEGATIVE NEGATIVE   Leukocytes,Ua NEGATIVE NEGATIVE   RBC / HPF 0-5 0 - 5 RBC/hpf   WBC, UA 0-5 0 - 5 WBC/hpf   Bacteria, UA NONE SEEN NONE SEEN   Squamous Epithelial / LPF 0-5 0 - 5    Comment: Performed at [facility], [address] [place], [place] [address]  Glucose, capillary     Status: Abnormal   Collection Time: 01/04/21  9:56 AM  Result Value Ref Range   Glucose-Capillary 149 (H) 70 - 99 mg/dL    Comment: Glucose reference range applies only to samples taken after fasting for at least 8 hours.  Basic metabolic panel     Status: Abnormal   Collection Time: 01/04/21  9:57 AM  Result Value Ref Range   Sodium 138 135 - 145 mmol/L   Potassium 3.4 (L) 3.5 - 5.1 mmol/L   Chloride 99 98 - 111 mmol/L   CO2 27 22 - 32 mmol/L   Glucose, Bld 162 (H) 70 - 99 mg/dL    Comment: Glucose reference range applies only to samples taken after fasting for at least 8 hours.   BUN 76 (H) 6 - 20 mg/dL   Creatinine, Ser 6.964.75 (H) 0.44 - 1.00 mg/dL   Calcium 9.0 8.9 - 29.510.3 mg/dL   GFR, Estimated 10 (L)  >60 mL/min    Comment: (NOTE) Calculated using the CKD-EPI Creatinine Equation (2021)    Anion gap 12 5 - 15    Comment: Performed at [facility], [address] [place], [place] [id number]  Glucose, capillary     Status: Abnormal   Collection Time: 01/04/21 10:55 AM  Result Value Ref Range   Glucose-Capillary 158 (H) 70 - 99 mg/dL    Comment: Glucose reference range applies only to samples taken after fasting for at least 8 hours.   Comment 1 Notify RN    Comment 2 Document in Chart   Glucose, capillary     Status: Abnormal   Collection Time: 01/04/21  4:09 PM  Result Value Ref Range   Glucose-Capillary 191 (H) 70 - 99  mg/dL    Comment: Glucose reference range applies only to samples taken after fasting for at least 8 hours.  Glucose, capillary     Status: Abnormal   Collection Time: 01/04/21  5:20 PM  Result Value Ref Range   Glucose-Capillary 189 (H) 70 - 99 mg/dL    Comment: Glucose reference range applies only to samples taken after fasting for at least 8 hours.   Comment 1 Notify RN    Comment 2 Document in Chart   Glucose, capillary     Status: Abnormal   Collection Time: 01/04/21  9:23 PM  Result Value Ref Range   Glucose-Capillary 378 (H) 70 - 99 mg/dL    Comment: Glucose reference range applies only to samples taken after fasting for at least 8 hours.  CBC with Differential/Platelet     Status: Abnormal   Collection Time: 01/05/21  5:43 AM  Result Value Ref Range   WBC 17.2 (H) 4.0 - 10.5 K/uL   RBC 3.17 (L) 3.87 - 5.11 MIL/uL   Hemoglobin 9.7 (L) 12.0 - 15.0 g/dL   HCT 40.9 (L) 81.1 - 91.4 %   MCV 97.2 80.0 - 100.0 fL   MCH 30.6 26.0 - 34.0 pg   MCHC 31.5 30.0 - 36.0 g/dL   RDW 78.2 95.6 - 21.3 %   Platelets 364 150 - 400 K/uL   nRBC 0.0 0.0 - 0.2 %   Neutrophils Relative % 83 %   Neutro Abs 14.3 (H) 1.7 - 7.7 K/uL   Lymphocytes Relative 9 %   Lymphs Abs 1.5 0.7 - 4.0 K/uL   Monocytes Relative 7 %   Monocytes Absolute 1.3 (H) 0.1 - 1.0  K/uL   Eosinophils Relative 0 %   Eosinophils Absolute 0.0 0.0 - 0.5 K/uL   Basophils Relative 0 %   Basophils Absolute 0.0 0.0 - 0.1 K/uL   Immature Granulocytes 1 %   Abs Immature Granulocytes 0.13 (H) 0.00 - 0.07 K/uL    Comment: Performed at [facility], [address], [place], [place] [address]  Basic metabolic panel     Status: Abnormal   Collection Time: 01/05/21  5:43 AM  Result Value Ref Range   Sodium 131 (L) 135 - 145 mmol/L    Comment: DELTA CHECK NOTED   Potassium 3.8 3.5 - 5.1 mmol/L   Chloride 95 (L) 98 - 111 mmol/L   CO2 24 22 - 32 mmol/L   Glucose, Bld 410 (H) 70 - 99 mg/dL    Comment: Glucose reference range applies only to samples taken after fasting for at least 8 hours.   BUN 79 (H) 6 - 20 mg/dL   Creatinine, Ser 8.46 (H) 0.44 - 1.00 mg/dL   Calcium 8.2 (L) 8.9 - 10.3 mg/dL   GFR, Estimated 10 (L) >60 mL/min    Comment: (NOTE) Calculated using the CKD-EPI Creatinine Equation (2021)    Anion gap 12 5 - 15    Comment: Performed at [facility], [address], [place], [place] [address]  Glucose, capillary     Status: Abnormal   Collection Time: 01/05/21  7:50 AM  Result Value Ref Range   Glucose-Capillary 329 (H) 70 - 99 mg/dL    Comment: Glucose reference range applies only to samples taken after fasting for at least 8 hours.  Glucose, capillary     Status: Abnormal   Collection Time: 01/05/21  8:13 AM  Result Value Ref Range   Glucose-Capillary 364 (H) 70 -  99 mg/dL    Comment: Glucose reference range applies only to samples taken after fasting for at least 8 hours.  Glucose, capillary     Status: Abnormal   Collection Time: 01/05/21 11:57 AM  Result Value Ref Range   Glucose-Capillary 266 (H) 70 - 99 mg/dL    Comment: Glucose reference range applies only to samples taken after fasting for at least 8 hours.   Comment 1 Document in Chart   Glucose, capillary     Status: Abnormal   Collection Time: 01/05/21  4:33  PM  Result Value Ref Range   Glucose-Capillary 101 (H) 70 - 99 mg/dL    Comment: Glucose reference range applies only to samples taken after fasting for at least 8 hours.  Glucose, capillary     Status: None   Collection Time: 01/05/21  9:22 PM  Result Value Ref Range   Glucose-Capillary 81 70 - 99 mg/dL    Comment: Glucose reference range applies only to samples taken after fasting for at least 8 hours.  CBC     Status: Abnormal   Collection Time: 01/06/21  5:29 AM  Result Value Ref Range   WBC 14.8 (H) 4.0 - 10.5 K/uL   RBC 3.20 (L) 3.87 - 5.11 MIL/uL   Hemoglobin 9.8 (L) 12.0 - 15.0 g/dL   HCT 16.1 (L) 09.6 - 04.5 %   MCV 99.1 80.0 - 100.0 fL   MCH 30.6 26.0 - 34.0 pg   MCHC 30.9 30.0 - 36.0 g/dL   RDW 40.9 81.1 - 91.4 %   Platelets 376 150 - 400 K/uL   nRBC 0.0 0.0 - 0.2 %    Comment: Performed at [facility], [address] [place], [place] [address]  Basic metabolic panel     Status: Abnormal   Collection Time: 01/06/21  5:29 AM  Result Value Ref Range   Sodium 139 135 - 145 mmol/L    Comment: DELTA CHECK NOTED   Potassium 4.0 3.5 - 5.1 mmol/L   Chloride 102 98 - 111 mmol/L   CO2 25 22 - 32 mmol/L   Glucose, Bld 136 (H) 70 - 99 mg/dL    Comment: Glucose reference range applies only to samples taken after fasting for at least 8 hours.   BUN 72 (H) 6 - 20 mg/dL   Creatinine, Ser 6.21 (H) 0.44 - 1.00 mg/dL   Calcium 8.5 (L) 8.9 - 10.3 mg/dL   GFR, Estimated 10 (L) >60 mL/min    Comment: (NOTE) Calculated using the CKD-EPI Creatinine Equation (2021)    Anion gap 12 5 - 15    Comment: Performed at [facility], [address], [place], [place] [address]  Magnesium     Status: None   Collection Time: 01/06/21  5:29 AM  Result Value Ref Range   Magnesium 2.1 1.7 - 2.4 mg/dL    Comment: Performed at [facility], [address] [place], [place] [address]  Phosphorus     Status: None   Collection Time: 01/06/21   5:29 AM  Result Value Ref Range   Phosphorus 4.4 2.5 - 4.6 mg/dL    Comment: Performed at [facility], [address], [place], [place] [address]  Glucose, capillary     Status: Abnormal   Collection Time: 01/06/21  7:59 AM  Result Value Ref Range   Glucose-Capillary 155 (H) 70 - 99 mg/dL    Comment: Glucose reference range applies only to samples  taken after fasting for at least 8 hours.  Glucose, capillary     Status: Abnormal   Collection Time: 01/06/21 11:57 AM  Result Value Ref Range   Glucose-Capillary 212 (H) 70 - 99 mg/dL    Comment: Glucose reference range applies only to samples taken after fasting for at least 8 hours.  Glucose, capillary     Status: Abnormal   Collection Time: 01/06/21  5:25 PM  Result Value Ref Range   Glucose-Capillary 189 (H) 70 - 99 mg/dL    Comment: Glucose reference range applies only to samples taken after fasting for at least 8 hours.  Glucose, capillary     Status: Abnormal   Collection Time: 01/06/21  9:09 PM  Result Value Ref Range   Glucose-Capillary 222 (H) 70 - 99 mg/dL    Comment: Glucose reference range applies only to samples taken after fasting for at least 8 hours.  Renal function panel     Status: Abnormal   Collection Time: 01/07/21  6:45 AM  Result Value Ref Range   Sodium 140 135 - 145 mmol/L   Potassium 3.8 3.5 - 5.1 mmol/L   Chloride 103 98 - 111 mmol/L   CO2 27 22 - 32 mmol/L   Glucose, Bld 229 (H) 70 - 99 mg/dL    Comment: Glucose reference range applies only to samples taken after fasting for at least 8 hours.   BUN 54 (H) 6 - 20 mg/dL   Creatinine, Ser 1.61 (H) 0.44 - 1.00 mg/dL   Calcium 8.2 (L) 8.9 - 10.3 mg/dL   Phosphorus 3.8 2.5 - 4.6 mg/dL   Albumin 2.2 (L) 3.5 - 5.0 g/dL   GFR, Estimated 13 (L) >60 mL/min    Comment: (NOTE) Calculated using the CKD-EPI Creatinine Equation (2021)    Anion gap 10 5 - 15    Comment: Performed at [facility], [address]  [place], [place] [address]  Glucose, capillary     Status: Abnormal   Collection Time: 01/07/21  7:15 AM  Result Value Ref Range   Glucose-Capillary 193 (H) 70 - 99 mg/dL    Comment: Glucose reference range applies only to samples taken after fasting for at least 8 hours.

## 2021-01-07 NOTE — Consult Note (Signed)
WOC Nurse Consult Note: Patient receiving care in [facility]3. Assisted by 3 RNs for positioning for VAC change. Reason for Consult:Left buttock VAC dressing change. Wound type: surgical Pressure Injury POA: Yes/No/NA Measurement: 11 cm x 9 cm x 5.5 cm Wound bed: pink Drainage (amount, consistency, odor) dark red in VAC cannister Periwound: intact and protected at time of dressing change by Skin Barrier Film. Dressing procedure/placement/frequency: One piece of black foam removed from main wound bed, one piece of black foam removed from small, non-connecting wound inferior to main wound. One piece of black foam placed into each. Barrier ring placed along inferior wound border between the wound and the anus. Drape applied, immediate seal obtained.  Patient received IV pain med prior to removal of dressing from wound bed.  Helmut Muster, RN, MSN, CWOCN, CNS-BC, pager [phone]

## 2021-01-07 NOTE — Progress Notes (Signed)
PROGRESS NOTE    Erin Davila  RFX:[id number] DOB: 17-Jul-1960 DOA: 01/03/2021 PCP: [name], MD    Brief Narrative:  This 60 year old female with history of insulin-dependent diabetes type 2, hypertension, morbid obesity who presented as direct admission from general surgery outpatient office for evaluation of necrotizing wound on the left buttock.  We were consulted for the management of hyperglycemia /AKI.  She recently had incision and drainage for her left gluteal abscess and was taking antibiotics at home. On presentation to the office at general surgery, wound was found to be necrotic with persistent drainage and therefore she was sent for direct admission.  On presentation she was severely hyperglycemic, lab work showed severe AKI with creatinine in the range of 4, no history of CKD.  Assessment & Plan:   Principal Problem:   Diabetes mellitus type 2 in obese [facility]) Active Problems:   HTN (hypertension)   Morbid obesity (HCC)   Cellulitis and abscess of buttock   Hyperglycemia due to diabetes mellitus (HCC)   Left buttock abscess   Diabetes type 2/ Hyperglycemia:  She takes 45 units Levemir insulin daily at home . She presented with severe hyperglycemia.   She was started on insulin drip initially, changed to Lantus and sliding scale.  Monitor blood sugars.   Hb A1c 12.5.  Increase Levemir to 45 units daily, change sliding scale to resistant. Diabetic consult appreciated.  Blood glucose improved.   AKI:  Creatinine in the range of 4 on presentation.  She takes olmesartan at home.  No history of CKD.   AKI is most likely prerenal secondary to polyuria causing volume depletion.   Renal Ultrasound did not show any obstruction.  continued on IV fluids, slight improvement in creatinine. Discontinue IV fluids, encourage oral intake,   Avoid nephrotoxins. Continue foley catheter. Nephro: No indication for hemodialysis.  Patient is making urine. monitor kidney function.     Severe sepsis/septic shock:  She presented with severe leukocytosis, hypotension.  Continue IV antibiotics.  Blood pressure is better now.   Necrotizing left gluteal wound:  Foul smell noted this mrng. Management as per general surgery.  Continue antibiotics, follow cultures.   Hypertension: She takes olmesartan.  Currently on hold due to AKI.  Monitor blood pressure.     Morbid obesity: BMI of 57. Diet counseling completed.   Hypokalemia: Replaced . Continue to monitor.    Subjective: Patient was seen and examined at bedside,  overnight events noted.  She reports feeling better. Patient is still has foul-smelling drainage from the wound.  Wound care is on the board.  Objective: Vitals:   01/06/21 1424 01/06/21 2147 01/07/21 0352 01/07/21 0440  BP: (!) 124/55 138/68  (!) 130/43  Pulse: 87 84  81  Resp: 17 18  18   Temp: 98.5 F (36.9 C) 98.6 F (37 C)  97.9 F (36.6 C)  TempSrc: Oral Oral  Oral  SpO2: 100% 98%  98%  Weight:   (!) 178.9 kg   Height:        Intake/Output Summary (Last 24 hours) at 01/07/2021 1213 Last data filed at 01/07/2021 0900 Gross per 24 hour  Intake 2165 ml  Output 1920 ml  Net 245 ml   Filed Weights   01/03/21 1755 01/06/21 1236 01/07/21 0352  Weight: (!) 172.4 kg (!) 173.7 kg (!) 178.9 kg    Examination:  General exam: Appears calm and comfortable, not in any acute distress. Respiratory system: Clear to auscultation. Respiratory effort normal. Cardiovascular system:  S1 & S2 heard, RRR. No JVD, murmurs, rubs, gallops or clicks. No pedal edema. Gastrointestinal system: Abdomen is nondistended, soft and nontender. No organomegaly or masses felt. Normal bowel sounds heard. Central nervous system: Alert and oriented. No focal neurological deficits. Extremities: Symmetric 5 x 5 power. Skin: There is foul-smelling wound noted on left buttock. Dressing noted. Psychiatry: Judgement and insight appear normal. Mood & affect appropriate.     Data  Reviewed: I have personally reviewed following labs and imaging studies  CBC: Recent Labs  Lab 01/03/21 2021 01/04/21 0228 01/05/21 0543 01/06/21 0529  WBC 15.5* 14.3* 17.2* 14.8*  NEUTROABS 13.0*  --  14.3*  --   HGB 11.0* 11.4* 9.7* 9.8*  HCT 34.3* 35.9* 30.8* 31.7*  MCV 95.5 95.7 97.2 99.1  PLT 376 410* 364 376   Basic Metabolic Panel: Recent Labs  Lab 01/04/21 0543 01/04/21 0957 01/05/21 0543 01/06/21 0529 01/07/21 0645  NA 137 138 131* 139 140  K 3.3* 3.4* 3.8 4.0 3.8  CL 97* 99 95* 102 103  CO2 23 27 24 25 27   GLUCOSE 266* 162* 410* 136* 229*  BUN 74* 76* 79* 72* 54*  CREATININE 4.54* 4.75* 4.91* 4.72* 3.91*  CALCIUM 9.0 9.0 8.2* 8.5* 8.2*  MG  --   --   --  2.1  --   PHOS  --   --   --  4.4 3.8   GFR: Estimated Creatinine Clearance: 27.2 mL/min (A) (by C-G formula based on SCr of 3.91 mg/dL (H)). Liver Function Tests: Recent Labs  Lab 01/03/21 2021 01/07/21 0645  AST 11*  --   ALT 15  --   ALKPHOS 188*  --   BILITOT 0.8  --   PROT 8.2*  --   ALBUMIN 2.7* 2.2*   No results for input(s): LIPASE, AMYLASE in the last 168 hours. No results for input(s): AMMONIA in the last 168 hours. Coagulation Profile: No results for input(s): INR, PROTIME in the last 168 hours. Cardiac Enzymes: No results for input(s): CKTOTAL, CKMB, CKMBINDEX, TROPONINI in the last 168 hours. BNP (last 3 results) No results for input(s): PROBNP in the last 8760 hours. HbA1C: No results for input(s): HGBA1C in the last 72 hours.  CBG: Recent Labs  Lab 01/06/21 1157 01/06/21 1725 01/06/21 2109 01/07/21 0715 01/07/21 1121  GLUCAP 212* 189* 222* 193* 232*   Lipid Profile: No results for input(s): CHOL, HDL, LDLCALC, TRIG, CHOLHDL, LDLDIRECT in the last 72 hours. Thyroid Function Tests: No results for input(s): TSH, T4TOTAL, FREET4, T3FREE, THYROIDAB in the last 72 hours. Anemia Panel: No results for input(s): VITAMINB12, FOLATE, FERRITIN, TIBC, IRON, RETICCTPCT in the last  72 hours. Sepsis Labs: No results for input(s): PROCALCITON, LATICACIDVEN in the last 168 hours.  Recent Results (from the past 240 hour(s))  Surgical PCR screen     Status: None   Collection Time: 01/03/21  9:00 PM   Specimen: Nasal Mucosa; Nasal Swab  Result Value Ref Range Status   MRSA, PCR NEGATIVE NEGATIVE Final   Staphylococcus aureus NEGATIVE NEGATIVE Final    Comment: (NOTE) The Xpert SA Assay (FDA approved for NASAL specimens in patients 7 years of age and older), is one component of a comprehensive surveillance program. It is not intended to diagnose infection nor to guide or monitor treatment. Performed at [facility], [address], [place], [place]   SARS Coronavirus 2 by RT PCR (hospital order, performed in [facility] hospital lab) Nasopharyngeal Nasal Mucosa  Status: None   Collection Time: 01/03/21  9:13 PM   Specimen: Nasal Mucosa; Nasopharyngeal  Result Value Ref Range Status   SARS Coronavirus 2 NEGATIVE NEGATIVE Final    Comment: (NOTE) SARS-CoV-2 target nucleic acids are NOT DETECTED.  The SARS-CoV-2 RNA is generally detectable in upper and lower respiratory specimens during the acute phase of infection. The lowest concentration of SARS-CoV-2 viral copies this assay can detect is 250 copies / mL. A negative result does not preclude SARS-CoV-2 infection and should not be used as the sole basis for treatment or other patient management decisions.  A negative result may occur with improper specimen collection / handling, submission of specimen other than nasopharyngeal swab, presence of viral mutation(s) within the areas targeted by this assay, and inadequate number of viral copies (<250 copies / mL). A negative result must be combined with clinical observations, patient history, and epidemiological information.  Fact Sheet for Patients:   BoilerBrush.com.cy  Fact Sheet for Healthcare  Providers: https://pope.com/  This test is not yet approved or  cleared by the [place] FDA and has been authorized for detection and/or diagnosis of SARS-CoV-2 by FDA under an Emergency Use Authorization (EUA).  This EUA will remain in effect (meaning this test can be used) for the duration of the COVID-19 declaration under Section 564(b)(1) of the Act, 21 U.S.C. section 360bbb-3(b)(1), unless the authorization is terminated or revoked sooner.  Performed at [facility], [address] [place], [place] [address]       Radiology Studies: No results found.  Scheduled Meds:  Chlorhexidine Gluconate Cloth  6 each Topical Daily   insulin aspart  0-20 Units Subcutaneous TID WC   insulin aspart  0-5 Units Subcutaneous QHS   [START ON 01/08/2021] insulin glargine  45 Units Subcutaneous Daily   mouth rinse  15 mL Mouth Rinse BID   Continuous Infusions:  sodium chloride 250 mL (01/05/21 0310)   sodium chloride Stopped (01/04/21 1107)   piperacillin-tazobactam (ZOSYN)  IV 12.5 mL/hr at 01/07/21 0809     LOS: 4 days    Time spent: 25 mins    Zyanna Leisinger, MD [facility]   If 7PM-7AM, please contact night-coverage

## 2021-01-07 NOTE — Progress Notes (Signed)
Inpatient Diabetes Program Recommendations  AACE/ADA: New Consensus Statement on Inpatient Glycemic Control (2015)  Target Ranges:  Prepandial:   less than 140 mg/dL      Peak postprandial:   less than 180 mg/dL (1-2 hours)      Critically ill patients:  140 - 180 mg/dL   Lab Results  Component Value Date   GLUCAP 193 (H) 01/07/2021   HGBA1C 12.8 (H) 01/03/2021    Review of Glycemic Control  Lab glucose 229 CBG 193 Eating 100%  Current orders for Inpatient glycemic control:  Lantus 35 units QD Novolog 0-20 units TID with meals and 0-5 HS  Inpatient Diabetes Program Recommendations:    Increase Lantus to 38 units QD Add Novolog 4 units TID with meals   Follow glucose trends.  Thank you. Ailene Ards, RD, LDN, CDE Inpatient Diabetes Coordinator [phone]

## 2021-01-08 ENCOUNTER — Encounter (HOSPITAL_COMMUNITY): Payer: Self-pay | Admitting: Surgery

## 2021-01-08 LAB — GLUCOSE, CAPILLARY
Glucose-Capillary: 118 mg/dL — ABNORMAL HIGH (ref 70–99)
Glucose-Capillary: 157 mg/dL — ABNORMAL HIGH (ref 70–99)
Glucose-Capillary: 158 mg/dL — ABNORMAL HIGH (ref 70–99)
Glucose-Capillary: 125 mg/dL — ABNORMAL HIGH (ref 70–99)

## 2021-01-08 LAB — RENAL FUNCTION PANEL
Albumin: 2.1 g/dL — ABNORMAL LOW (ref 3.5–5.0)
Anion gap: 9 (ref 5–15)
BUN: 41 mg/dL — ABNORMAL HIGH (ref 6–20)
CO2: 27 mmol/L (ref 22–32)
Calcium: 8.4 mg/dL — ABNORMAL LOW (ref 8.9–10.3)
Chloride: 107 mmol/L (ref 98–111)
Creatinine, Ser: 3.38 mg/dL — ABNORMAL HIGH (ref 0.44–1.00)
GFR, Estimated: 15 mL/min — ABNORMAL LOW (ref >60)
Glucose, Bld: 109 mg/dL — ABNORMAL HIGH (ref 70–99)
Phosphorus: 3.5 mg/dL (ref 2.5–4.6)
Potassium: 3.7 mmol/L (ref 3.5–5.1)
Sodium: 143 mmol/L (ref 135–145)

## 2021-01-08 MED ORDER — ENOXAPARIN SODIUM 30 MG/0.3ML IJ SOSY
30.0000 mg | PREFILLED_SYRINGE | Freq: Two times a day (BID) | INTRAMUSCULAR | Status: DC
  Administered 2021-01-08 – 2021-01-10 (×4): 30 mg via SUBCUTANEOUS
  Filled 2021-01-08 (×4): qty 0.3

## 2021-01-08 NOTE — Plan of Care (Signed)
  Problem: Nutrition: Goal: Adequate nutrition will be maintained Outcome: Progressing   Problem: Elimination: Goal: Will not experience complications related to bowel motility Outcome: Progressing   Problem: Activity: Goal: Risk for activity intolerance will decrease Outcome: Not Progressing   Problem: Activity: Goal: Risk for activity intolerance will decrease Outcome: Not Progressing

## 2021-01-08 NOTE — Progress Notes (Signed)
Physical Therapy Treatment Patient Details Name: Erin Davila MRN: [id number] DOB: 06/13/1961 Today's Date: 01/08/2021    History of Present Illness 60 year old with infected L buttocks absess with I& D 01/04/2021. PMH: DM and HTN    PT Comments    Pt limited by pain and complaints of nausea and dizziness. Assisted pt to EOB with mod assist to bring legs OOB. Pt required extended time to sit at EOB before being ready to stand, stating "just give me another minute before I try". Pt amb with RW to doorway of bathroom with min assist +1. Once at bathroom door pt stated she needed to sit with c/o of dizziness and nausea. Assisted pt back into bed. Mod assist +1 needed for sit to supine and to position pt comfortably in bed. Once supine pt stated that she was feeling much better since moving around. Consulted with evaluating PT in regards to pt current mobility status and D/C plans.    Follow Up Recommendations  CIR     Equipment Recommendations  None recommended by PT    Recommendations for Other Services       Precautions / Restrictions Precautions Precautions: None Precaution Comments: wound vac in place on L buttocks Restrictions Weight Bearing Restrictions: No    Mobility  Bed Mobility Overal bed mobility: Needs Assistance Bed Mobility: Supine to Sit;Sit to Supine     Supine to sit: Mod assist Sit to supine: Mod assist   General bed mobility comments: assistance mainly needed due to pain and where VAC and wound is located and very sore to move supine to sit and sit to supine    Transfers   Equipment used: Rolling walker (2 wheeled)                Ambulation/Gait Ambulation/Gait assistance: Min assist Gait Distance (Feet): 6 Feet Assistive device: Rolling walker (2 wheeled) Gait Pattern/deviations: Step-to pattern;Antalgic;Decreased step length - right;Decreased step length - left Gait velocity: decr.   General Gait Details: limited gait distance dut to pt  requesting to sit down.   Stairs             Wheelchair Mobility    Modified Rankin (Stroke Patients Only)       Balance                                            Cognition Arousal/Alertness: Awake/alert Behavior During Therapy: WFL for tasks assessed/performed Overall Cognitive Status: Within Functional Limits for tasks assessed                                        Exercises      General Comments        Pertinent Vitals/Pain Pain Score: 9  Pain Location: B LE during rest in bed, L buttocks during bed mobility. Pain Descriptors / Indicators: Burning;Aching;Sore Pain Intervention(s): Limited activity within patient's tolerance;Monitored during session;Premedicated before session    Home Living                      Prior Function            PT Goals (current goals can now be found in the care plan section) Acute Rehab PT Goals Patient Stated Goal: I have got  plan in 2 weeks I have to be better PT Goal Formulation: With patient Time For Goal Achievement: 01/19/21 Potential to Achieve Goals: Good    Frequency    Min 3X/week      PT Plan      Co-evaluation              AM-PAC PT "6 Clicks" Mobility   Outcome Measure  Help needed turning from your back to your side while in a flat bed without using bedrails?: A Little Help needed moving from lying on your back to sitting on the side of a flat bed without using bedrails?: A Lot Help needed moving to and from a bed to a chair (including a wheelchair)?: A Lot Help needed standing up from a chair using your arms (e.g., wheelchair or bedside chair)?: A Lot Help needed to walk in hospital room?: A Lot Help needed climbing 3-5 steps with a railing? : A Lot 6 Click Score: 13    End of Session Equipment Utilized During Treatment: Gait belt Activity Tolerance: Patient limited by pain Patient left: in bed;with call bell/phone within reach;with bed  alarm set Nurse Communication: Mobility status PT Visit Diagnosis: Other abnormalities of gait and mobility (R26.89);Pain     Time:  -     Charges:                       Alma Friendly, PTA Student  Acute Rehabilitation Services Pager : [phone] Office : [phone]    Alma Friendly 01/08/2021, 1:13 PM

## 2021-01-08 NOTE — Progress Notes (Signed)
01/08/2021  1627  Notified Dr Lucianne Muss that per tele patient had 8 beat run of Vtach. Checked V/S. V/S stable. Patient is lying in bed on her right side talking with family.

## 2021-01-08 NOTE — Progress Notes (Signed)
   Progress Note  4 Days Post-Op  Subjective: Patient reports she had pain with VAC change yesterday. She lives at home with her son who is in college and will be going back to school in August. Nausea improving   Objective: Vital signs in last 24 hours: Temp:  [98.1 F (36.7 C)-99.2 F (37.3 C)] 98.1 F (36.7 C) (07/05 1342) Pulse Rate:  [86-95] 92 (07/05 1342) Resp:  [20] 20 (07/05 1342) BP: (136-145)/(65-82) 143/65 (07/05 1342) SpO2:  [100 %] 100 % (07/05 1342) Weight:  [175.9 kg] 175.9 kg (07/05 0500) Last BM Date: 01/07/21  Intake/Output from previous day: 07/04 0701 - 07/05 0700 In: 2876.4 [P.O.:1550; I.V.:1210.8; IV Piggyback:115.6] Out: 2440 [Urine:2400; Drains:40] Intake/Output this shift: Total I/O In: -  Out: 700 [Urine:700]  PE: Gen: NAD, comfortable, laying supine  CV: RRR Pulm: Normal work of breathing Back: VAC in place; no surrounding cellulitis; ss fluid in cannister; no frank purulence Ext: SCDs in place   Lab Results:  Recent Labs    01/06/21 0529  WBC 14.8*  HGB 9.8*  HCT 31.7*  PLT 376   BMET Recent Labs    01/07/21 0645 01/08/21 0423  NA 140 143  K 3.8 3.7  CL 103 107  CO2 27 27  GLUCOSE 229* 109*  BUN 54* 41*  CREATININE 3.91* 3.38*  CALCIUM 8.2* 8.4*   PT/INR No results for input(s): LABPROT, INR in the last 72 hours. CMP     Component Value Date/Time   NA 143 01/08/2021 0423   K 3.7 01/08/2021 0423   CL 107 01/08/2021 0423   CO2 27 01/08/2021 0423   GLUCOSE 109 (H) 01/08/2021 0423   BUN 41 (H) 01/08/2021 0423   CREATININE 3.38 (H) 01/08/2021 0423   CALCIUM 8.4 (L) 01/08/2021 0423   PROT 8.2 (H) 01/03/2021 2021   ALBUMIN 2.1 (L) 01/08/2021 0423   AST 11 (L) 01/03/2021 2021   ALT 15 01/03/2021 2021   ALKPHOS 188 (H) 01/03/2021 2021   BILITOT 0.8 01/03/2021 2021   GFRNONAA 15 (L) 01/08/2021 0423   Lipase  No results found for: LIPASE     Studies/Results: No results found.  Anti-infectives: Anti-infectives  (From admission, onward)    Start     Dose/Rate Route Frequency Ordered Stop   01/03/21 2000  piperacillin-tazobactam (ZOSYN) IVPB 3.375 g        3.375 g 12.5 mL/hr over 240 Minutes Intravenous Every 8 hours 01/03/21 1908          Assessment/Plan Left Buttock abscess S/P incision and drainage 7/1 Dr. Daphine Deutscher  - WOC following for [facility] changes - M/W/F - currently on Zosyn - check WBC tomorrow AM - will see for dressing change tomorrow  - PT recommending CIR  FEN: CM diet  VTE: SCDs - needs weight base lovenox ID: Zosyn 6/30>>  T2DM - per TRH and diabetes coordinator, improving  AKI - Cr 3.38, UOP improving, nephrology was consulted as well  HTN Morbid Obesity - BMI 58.96  Appreciate TRH assistance. CIR evaluation for possible admission pending. OT eval as well   LOS: 5 days    Juliet Rude, [facility] 01/08/2021, 3:12 PM Please see Amion for pager number during day hours 7:00am-4:30pm

## 2021-01-08 NOTE — Progress Notes (Signed)
Nephrology Follow-Up Consult note   Assessment/Recommendations: Erin Davila is a/an 60 y.o. female with a past medical history significant for DM2, HTN, HLD, chronic wounds, admitted for chronic wound management and AKI.     Nonoliguric acute kidney injury: Likely multifactorial with dehydration, wounds, recent surgery, NSAID use, and ARB use contributing.  Baseline creatinine now known to be 2.  Patient continues to improve -Holding culprit meds -Continue with oral hydration -Likely sign off tomorrow if her creatinine continues to improve -Continue to monitor daily Cr, Dose meds for GFR -Monitor Daily I/Os, Daily weight  -Maintain MAP>65 for optimal renal perfusion.  -Avoid nephrotoxic medications including NSAIDs and Vanc/Zosyn combo -Renal ultrasound without major concern -Currently no indication for HD  Chronic wounds: Status post I&D of gluteal wound.  Antibiotics per primary team  Hypotension/sepsis: Resolved.  Antibiotics as above  Uncontrolled Diabetes Mellitus Type 2 with Hyperglycemia: Management per primary  Hypertension: History of this diagnosis.  Blood pressure stable at this time without medications.  Continue to monitor  Recommendations conveyed to primary service.    Darnell Level [facility] 01/08/2021 2:15 PM  ___________________________________________________________  CC: AKI  Interval History/Subjective: Patient continues to feel well today without any complaints.  Slept better last night.  I discussed the patient's case with her primary care physician Dr. Pecola Leisure.  Her baseline creatinine is around 2.   Medications:  Current Facility-Administered Medications  Medication Dose Route Frequency Provider Last Rate Last Admin   0.9 %  sodium chloride infusion   Intravenous PRN Jacinto Halim, PA-C 10 mL/hr at 01/05/21 0310 250 mL at 01/05/21 0310   0.9 %  sodium chloride infusion   Intravenous PRN Jacinto Halim, PA-C   Stopped at  01/04/21 1107   acetaminophen (TYLENOL) tablet 650 mg  650 mg Oral Q6H PRN Jacinto Halim, PA-C       Or   acetaminophen (TYLENOL) suppository 650 mg  650 mg Rectal Q6H PRN Maczis, Elmer Sow, PA-C       Chlorhexidine Gluconate Cloth 2 % PADS 6 each  6 each Topical Daily Jacinto Halim, PA-C   6 each at 01/07/21 1000   dextrose 50 % solution 0-50 mL  0-50 mL Intravenous PRN Jacinto Halim, PA-C       diphenhydrAMINE (BENADRYL) capsule 25 mg  25 mg Oral Q6H PRN Jacinto Halim, PA-C       Or   diphenhydrAMINE (BENADRYL) injection 25 mg  25 mg Intravenous Q6H PRN Maczis, Elmer Sow, PA-C       hydrALAZINE (APRESOLINE) injection 10 mg  10 mg Intravenous Q2H PRN Maczis, Elmer Sow, PA-C       hyoscyamine (LEVSIN) 0.125 MG/5ML elixir 0.25 mg  0.25 mg Oral Q6H PRN Jacinto Halim, PA-C   0.25 mg at 01/04/21 9242   insulin aspart (novoLOG) injection 0-20 Units  0-20 Units Subcutaneous TID WC Jacinto Halim, PA-C   4 Units at 01/08/21 1248   insulin aspart (novoLOG) injection 0-5 Units  0-5 Units Subcutaneous QHS Jacinto Halim, PA-C   2 Units at 01/06/21 2135   insulin glargine (LANTUS) injection 45 Units  45 Units Subcutaneous Daily Cipriano Bunker, MD   45 Units at 01/08/21 0900   lip balm (CARMEX) ointment   Topical PRN Jacinto Halim, PA-C   Given at 01/03/21 2354   MEDLINE mouth rinse  15 mL Mouth Rinse BID Jacinto Halim, PA-C   15 mL at 01/08/21 1000  methocarbamol (ROBAXIN) tablet 500 mg  500 mg Oral Q6H PRN Jacinto Halim, PA-C   500 mg at 01/08/21 1610   morphine 2 MG/ML injection 2 mg  2 mg Intravenous Q2H PRN Jacinto Halim, PA-C   2 mg at 01/08/21 0359   ondansetron (ZOFRAN-ODT) disintegrating tablet 4 mg  4 mg Oral Q6H PRN Jacinto Halim, PA-C       Or   ondansetron [facility]) injection 4 mg  4 mg Intravenous Q6H PRN Jacinto Halim, PA-C   4 mg at 01/06/21 1224   oxyCODONE (Oxy IR/ROXICODONE) immediate release tablet 5-10 mg  5-10 mg Oral Q4H PRN Jacinto Halim, PA-C   10 mg at 01/08/21 0926   piperacillin-tazobactam (ZOSYN) IVPB 3.375 g  3.375 g Intravenous Q8H Jacinto Halim, PA-C 12.5 mL/hr at 01/08/21 1032 3.375 g at 01/08/21 1032      Review of Systems: 10 systems reviewed and negative except per interval history/subjective  Physical Exam: Vitals:   01/08/21 0516 01/08/21 1342  BP: (!) 145/82 (!) 143/65  Pulse: 86 92  Resp: 20   Temp: 98.8 F (37.1 C) 98.1 F (36.7 C)  SpO2: 100% 100%   Total I/O In: -  Out: 700 [Urine:700]  Intake/Output Summary (Last 24 hours) at 01/08/2021 1415 Last data filed at 01/08/2021 1139 Gross per 24 hour  Intake 929.64 ml  Output 1810 ml  Net -880.36 ml   Constitutional: Obese, lying in bed, no distress ENMT: ears and nose without scars or lesions, MMM CV: normal rate, no edema Respiratory: Bilateral chest rise normal work of breathing Gastrointestinal: soft, non-tender, no palpable masses or hernias Psych: alert, judgement/insight appropriate, appropriate mood and affect   Test Results I personally reviewed new and old clinical labs and radiology tests Lab Results  Component Value Date   NA 143 01/08/2021   K 3.7 01/08/2021   CL 107 01/08/2021   CO2 27 01/08/2021   BUN 41 (H) 01/08/2021   CREATININE 3.38 (H) 01/08/2021   CALCIUM 8.4 (L) 01/08/2021   ALBUMIN 2.1 (L) 01/08/2021   PHOS 3.5 01/08/2021   Recent Results (from the past 2160 hour(s))  Glucose, capillary     Status: Abnormal   Collection Time: 01/03/21  7:50 PM  Result Value Ref Range   Glucose-Capillary >600 (HH) 70 - 99 mg/dL    Comment: Glucose reference range applies only to samples taken after fasting for at least 8 hours.  Comprehensive metabolic panel     Status: Abnormal   Collection Time: 01/03/21  8:21 PM  Result Value Ref Range   Sodium 129 (L) 135 - 145 mmol/L   Potassium 4.3 3.5 - 5.1 mmol/L   Chloride 92 (L) 98 - 111 mmol/L   CO2 24 22 - 32 mmol/L   Glucose, Bld 685 (HH) 70 - 99 mg/dL     Comment: Glucose reference range applies only to samples taken after fasting for at least 8 hours. CRITICAL RESULT CALLED TO, READ BACK BY AND VERIFIED WITH: MILLER, J. RN@2054  01/03/21 KELLY, J.    BUN 74 (H) 6 - 20 mg/dL   Creatinine, Ser 9.60 (H) 0.44 - 1.00 mg/dL   Calcium 8.7 (L) 8.9 - 10.3 mg/dL   Total Protein 8.2 (H) 6.5 - 8.1 g/dL   Albumin 2.7 (L) 3.5 - 5.0 g/dL   AST 11 (L) 15 - 41 U/L   ALT 15 0 - 44 U/L   Alkaline Phosphatase 188 (H) 38 - 126 U/L  Total Bilirubin 0.8 0.3 - 1.2 mg/dL   GFR, Estimated 11 (L) >60 mL/min    Comment: (NOTE) Calculated using the CKD-EPI Creatinine Equation (2021)    Anion gap 13 5 - 15    Comment: Performed at [facility], [address] [place], [place] [address]  CBC WITH DIFFERENTIAL     Status: Abnormal   Collection Time: 01/03/21  8:21 PM  Result Value Ref Range   WBC 15.5 (H) 4.0 - 10.5 K/uL   RBC 3.59 (L) 3.87 - 5.11 MIL/uL   Hemoglobin 11.0 (L) 12.0 - 15.0 g/dL   HCT 60.4 (L) 54.0 - 98.1 %   MCV 95.5 80.0 - 100.0 fL   MCH 30.6 26.0 - 34.0 pg   MCHC 32.1 30.0 - 36.0 g/dL   RDW 19.1 47.8 - 29.5 %   Platelets 376 150 - 400 K/uL   nRBC 0.0 0.0 - 0.2 %   Neutrophils Relative % 83 %   Neutro Abs 13.0 (H) 1.7 - 7.7 K/uL   Lymphocytes Relative 8 %   Lymphs Abs 1.2 0.7 - 4.0 K/uL   Monocytes Relative 8 %   Monocytes Absolute 1.2 (H) 0.1 - 1.0 K/uL   Eosinophils Relative 0 %   Eosinophils Absolute 0.0 0.0 - 0.5 K/uL   Basophils Relative 0 %   Basophils Absolute 0.0 0.0 - 0.1 K/uL   Immature Granulocytes 1 %   Abs Immature Granulocytes 0.07 0.00 - 0.07 K/uL    Comment: Performed at [facility], [address], [place], [place] [address]  Hemoglobin A1c     Status: Abnormal   Collection Time: 01/03/21  8:21 PM  Result Value Ref Range   Hgb A1c MFr Bld 12.8 (H) 4.8 - 5.6 %    Comment: (NOTE)         Prediabetes: 5.7 - 6.4         Diabetes: >6.4         Glycemic control for adults with  diabetes: <7.0    Mean Plasma Glucose 321 mg/dL    Comment: (NOTE) Performed At: [facility] [address] [place], [place] [id number] Jolene Schimke MD EX:[id number]   Surgical PCR screen     Status: None   Collection Time: 01/03/21  9:00 PM   Specimen: Nasal Mucosa; Nasal Swab  Result Value Ref Range   MRSA, PCR NEGATIVE NEGATIVE   Staphylococcus aureus NEGATIVE NEGATIVE    Comment: (NOTE) The Xpert SA Assay (FDA approved for NASAL specimens in patients 53 years of age and older), is one component of a comprehensive surveillance program. It is not intended to diagnose infection nor to guide or monitor treatment. Performed at [facility], [address] [place], [place] [address]   SARS Coronavirus 2 by RT PCR (hospital order, performed in [facility] hospital lab) Nasopharyngeal Nasal Mucosa     Status: None   Collection Time: 01/03/21  9:13 PM   Specimen: Nasal Mucosa; Nasopharyngeal  Result Value Ref Range   SARS Coronavirus 2 NEGATIVE NEGATIVE    Comment: (NOTE) SARS-CoV-2 target nucleic acids are NOT DETECTED.  The SARS-CoV-2 RNA is generally detectable in upper and lower respiratory specimens during the acute phase of infection. The lowest concentration of SARS-CoV-2 viral copies this assay can detect is 250 copies / mL. A negative result does not preclude SARS-CoV-2 infection and should not be used as the sole basis for treatment or other patient management decisions.  A negative  result may occur with improper specimen collection / handling, submission of specimen other than nasopharyngeal swab, presence of viral mutation(s) within the areas targeted by this assay, and inadequate number of viral copies (<250 copies / mL). A negative result must be combined with clinical observations, patient history, and epidemiological information.  Fact Sheet for Patients:   BoilerBrush.com.cy  Fact Sheet for Healthcare  Providers: https://pope.com/  This test is not yet approved or  cleared by the [place] FDA and has been authorized for detection and/or diagnosis of SARS-CoV-2 by FDA under an Emergency Use Authorization (EUA).  This EUA will remain in effect (meaning this test can be used) for the duration of the COVID-19 declaration under Section 564(b)(1) of the Act, 21 U.S.C. section 360bbb-3(b)(1), unless the authorization is terminated or revoked sooner.  Performed at [facility], [address], [place], [place] [address]   Glucose, capillary     Status: Abnormal   Collection Time: 01/03/21 11:06 PM  Result Value Ref Range   Glucose-Capillary >600 (HH) 70 - 99 mg/dL    Comment: Glucose reference range applies only to samples taken after fasting for at least 8 hours.  Glucose, capillary     Status: Abnormal   Collection Time: 01/03/21 11:42 PM  Result Value Ref Range   Glucose-Capillary 591 (HH) 70 - 99 mg/dL    Comment: Glucose reference range applies only to samples taken after fasting for at least 8 hours.   Comment 1 Notify RN   Glucose, capillary     Status: Abnormal   Collection Time: 01/04/21 12:21 AM  Result Value Ref Range   Glucose-Capillary 528 (HH) 70 - 99 mg/dL    Comment: Glucose reference range applies only to samples taken after fasting for at least 8 hours.   Comment 1 Notify RN   Glucose, capillary     Status: Abnormal   Collection Time: 01/04/21 12:56 AM  Result Value Ref Range   Glucose-Capillary 452 (H) 70 - 99 mg/dL    Comment: Glucose reference range applies only to samples taken after fasting for at least 8 hours.  Glucose, capillary     Status: Abnormal   Collection Time: 01/04/21  1:28 AM  Result Value Ref Range   Glucose-Capillary 324 (H) 70 - 99 mg/dL    Comment: Glucose reference range applies only to samples taken after fasting for at least 8 hours.  Basic metabolic panel     Status: Abnormal   Collection  Time: 01/04/21  2:28 AM  Result Value Ref Range   Sodium 137 135 - 145 mmol/L    Comment: DELTA CHECK NOTED   Potassium 3.4 (L) 3.5 - 5.1 mmol/L    Comment: DELTA CHECK NOTED   Chloride 97 (L) 98 - 111 mmol/L   CO2 23 22 - 32 mmol/L   Glucose, Bld 303 (H) 70 - 99 mg/dL    Comment: Glucose reference range applies only to samples taken after fasting for at least 8 hours.   BUN 74 (H) 6 - 20 mg/dL   Creatinine, Ser 4.78 (H) 0.44 - 1.00 mg/dL   Calcium 9.0 8.9 - 29.5 mg/dL   GFR, Estimated 11 (L) >60 mL/min    Comment: (NOTE) Calculated using the CKD-EPI Creatinine Equation (2021)    Anion gap 17 (H) 5 - 15    Comment: Performed at [facility], [address], [place], [place] [address]  CBC     Status: Abnormal   Collection Time:  01/04/21  2:28 AM  Result Value Ref Range   WBC 14.3 (H) 4.0 - 10.5 K/uL   RBC 3.75 (L) 3.87 - 5.11 MIL/uL   Hemoglobin 11.4 (L) 12.0 - 15.0 g/dL   HCT 16.1 (L) 09.6 - 04.5 %   MCV 95.7 80.0 - 100.0 fL   MCH 30.4 26.0 - 34.0 pg   MCHC 31.8 30.0 - 36.0 g/dL   RDW 40.9 81.1 - 91.4 %   Platelets 410 (H) 150 - 400 K/uL   nRBC 0.0 0.0 - 0.2 %    Comment: Performed at [facility], [address], [place], [place] [address]  Glucose, capillary     Status: Abnormal   Collection Time: 01/04/21  2:34 AM  Result Value Ref Range   Glucose-Capillary 219 (H) 70 - 99 mg/dL    Comment: Glucose reference range applies only to samples taken after fasting for at least 8 hours.  Glucose, capillary     Status: Abnormal   Collection Time: 01/04/21  3:41 AM  Result Value Ref Range   Glucose-Capillary 248 (H) 70 - 99 mg/dL    Comment: Glucose reference range applies only to samples taken after fasting for at least 8 hours.  Glucose, capillary     Status: Abnormal   Collection Time: 01/04/21  4:45 AM  Result Value Ref Range   Glucose-Capillary 248 (H) 70 - 99 mg/dL    Comment: Glucose reference range applies only to samples taken  after fasting for at least 8 hours.  Basic metabolic panel     Status: Abnormal   Collection Time: 01/04/21  5:43 AM  Result Value Ref Range   Sodium 137 135 - 145 mmol/L   Potassium 3.3 (L) 3.5 - 5.1 mmol/L   Chloride 97 (L) 98 - 111 mmol/L   CO2 23 22 - 32 mmol/L   Glucose, Bld 266 (H) 70 - 99 mg/dL    Comment: Glucose reference range applies only to samples taken after fasting for at least 8 hours.   BUN 74 (H) 6 - 20 mg/dL   Creatinine, Ser 6.21 (H) 0.44 - 1.00 mg/dL   Calcium 9.0 8.9 - 30.8 mg/dL   GFR, Estimated 11 (L) >60 mL/min    Comment: (NOTE) Calculated using the CKD-EPI Creatinine Equation (2021)    Anion gap 17 (H) 5 - 15    Comment: Performed at [facility], [address], [place], [place] [address]  Glucose, capillary     Status: Abnormal   Collection Time: 01/04/21  5:50 AM  Result Value Ref Range   Glucose-Capillary 239 (H) 70 - 99 mg/dL    Comment: Glucose reference range applies only to samples taken after fasting for at least 8 hours.  Glucose, capillary     Status: Abnormal   Collection Time: 01/04/21  6:47 AM  Result Value Ref Range   Glucose-Capillary 211 (H) 70 - 99 mg/dL    Comment: Glucose reference range applies only to samples taken after fasting for at least 8 hours.  Glucose, capillary     Status: Abnormal   Collection Time: 01/04/21  7:54 AM  Result Value Ref Range   Glucose-Capillary 219 (H) 70 - 99 mg/dL    Comment: Glucose reference range applies only to samples taken after fasting for at least 8 hours.  Glucose, capillary     Status: Abnormal   Collection Time: 01/04/21  8:55 AM  Result Value Ref Range   Glucose-Capillary 150 (H) 70 -  99 mg/dL    Comment: Glucose reference range applies only to samples taken after fasting for at least 8 hours.   Comment 1 Notify RN    Comment 2 Document in Chart   Sodium, urine, random     Status: None   Collection Time: 01/04/21  9:36 AM  Result Value Ref Range   Sodium, Ur 30  mmol/L    Comment: Performed at [facility], [address], [place], [place] [address]  Creatinine, urine, random     Status: None   Collection Time: 01/04/21  9:36 AM  Result Value Ref Range   Creatinine, Urine 91.66 mg/dL    Comment: Performed at [facility], [address], [place], [place] [address]  Urinalysis, Routine w reflex microscopic Urine, Catheterized     Status: Abnormal   Collection Time: 01/04/21  9:36 AM  Result Value Ref Range   Color, Urine YELLOW YELLOW   APPearance CLEAR CLEAR   Specific Gravity, Urine 1.012 1.005 - 1.030   pH 5.0 5.0 - 8.0   Glucose, UA >=500 (A) NEGATIVE mg/dL   Hgb urine dipstick NEGATIVE NEGATIVE   Bilirubin Urine NEGATIVE NEGATIVE   Ketones, ur NEGATIVE NEGATIVE mg/dL   Protein, ur 30 (A) NEGATIVE mg/dL   Nitrite NEGATIVE NEGATIVE   Leukocytes,Ua NEGATIVE NEGATIVE   RBC / HPF 0-5 0 - 5 RBC/hpf   WBC, UA 0-5 0 - 5 WBC/hpf   Bacteria, UA NONE SEEN NONE SEEN   Squamous Epithelial / LPF 0-5 0 - 5    Comment: Performed at [facility], [address], [place], [place] [address]  Glucose, capillary     Status: Abnormal   Collection Time: 01/04/21  9:56 AM  Result Value Ref Range   Glucose-Capillary 149 (H) 70 - 99 mg/dL    Comment: Glucose reference range applies only to samples taken after fasting for at least 8 hours.  Basic metabolic panel     Status: Abnormal   Collection Time: 01/04/21  9:57 AM  Result Value Ref Range   Sodium 138 135 - 145 mmol/L   Potassium 3.4 (L) 3.5 - 5.1 mmol/L   Chloride 99 98 - 111 mmol/L   CO2 27 22 - 32 mmol/L   Glucose, Bld 162 (H) 70 - 99 mg/dL    Comment: Glucose reference range applies only to samples taken after fasting for at least 8 hours.   BUN 76 (H) 6 - 20 mg/dL   Creatinine, Ser 9.14 (H) 0.44 - 1.00 mg/dL   Calcium 9.0 8.9 - 78.2 mg/dL   GFR, Estimated 10 (L) >60 mL/min    Comment: (NOTE) Calculated using the CKD-EPI Creatinine  Equation (2021)    Anion gap 12 5 - 15    Comment: Performed at [facility], [address] [place], [place] [address]  Glucose, capillary     Status: Abnormal   Collection Time: 01/04/21 10:55 AM  Result Value Ref Range   Glucose-Capillary 158 (H) 70 - 99 mg/dL    Comment: Glucose reference range applies only to samples taken after fasting for at least 8 hours.   Comment 1 Notify RN    Comment 2 Document in Chart   Glucose, capillary     Status: Abnormal   Collection Time: 01/04/21  4:09 PM  Result Value Ref Range   Glucose-Capillary 191 (H) 70 - 99 mg/dL    Comment: Glucose reference range applies only to samples taken after  fasting for at least 8 hours.  Glucose, capillary     Status: Abnormal   Collection Time: 01/04/21  5:20 PM  Result Value Ref Range   Glucose-Capillary 189 (H) 70 - 99 mg/dL    Comment: Glucose reference range applies only to samples taken after fasting for at least 8 hours.   Comment 1 Notify RN    Comment 2 Document in Chart   Glucose, capillary     Status: Abnormal   Collection Time: 01/04/21  9:23 PM  Result Value Ref Range   Glucose-Capillary 378 (H) 70 - 99 mg/dL    Comment: Glucose reference range applies only to samples taken after fasting for at least 8 hours.  CBC with Differential/Platelet     Status: Abnormal   Collection Time: 01/05/21  5:43 AM  Result Value Ref Range   WBC 17.2 (H) 4.0 - 10.5 K/uL   RBC 3.17 (L) 3.87 - 5.11 MIL/uL   Hemoglobin 9.7 (L) 12.0 - 15.0 g/dL   HCT 40.9 (L) 81.1 - 91.4 %   MCV 97.2 80.0 - 100.0 fL   MCH 30.6 26.0 - 34.0 pg   MCHC 31.5 30.0 - 36.0 g/dL   RDW 78.2 95.6 - 21.3 %   Platelets 364 150 - 400 K/uL   nRBC 0.0 0.0 - 0.2 %   Neutrophils Relative % 83 %   Neutro Abs 14.3 (H) 1.7 - 7.7 K/uL   Lymphocytes Relative 9 %   Lymphs Abs 1.5 0.7 - 4.0 K/uL   Monocytes Relative 7 %   Monocytes Absolute 1.3 (H) 0.1 - 1.0 K/uL   Eosinophils Relative 0 %   Eosinophils Absolute 0.0 0.0 - 0.5  K/uL   Basophils Relative 0 %   Basophils Absolute 0.0 0.0 - 0.1 K/uL   Immature Granulocytes 1 %   Abs Immature Granulocytes 0.13 (H) 0.00 - 0.07 K/uL    Comment: Performed at [facility], [address], [place], [place] [address]  Basic metabolic panel     Status: Abnormal   Collection Time: 01/05/21  5:43 AM  Result Value Ref Range   Sodium 131 (L) 135 - 145 mmol/L    Comment: DELTA CHECK NOTED   Potassium 3.8 3.5 - 5.1 mmol/L   Chloride 95 (L) 98 - 111 mmol/L   CO2 24 22 - 32 mmol/L   Glucose, Bld 410 (H) 70 - 99 mg/dL    Comment: Glucose reference range applies only to samples taken after fasting for at least 8 hours.   BUN 79 (H) 6 - 20 mg/dL   Creatinine, Ser 8.46 (H) 0.44 - 1.00 mg/dL   Calcium 8.2 (L) 8.9 - 10.3 mg/dL   GFR, Estimated 10 (L) >60 mL/min    Comment: (NOTE) Calculated using the CKD-EPI Creatinine Equation (2021)    Anion gap 12 5 - 15    Comment: Performed at [facility], [address] [place], [place] [address]  Glucose, capillary     Status: Abnormal   Collection Time: 01/05/21  7:50 AM  Result Value Ref Range   Glucose-Capillary 329 (H) 70 - 99 mg/dL    Comment: Glucose reference range applies only to samples taken after fasting for at least 8 hours.  Glucose, capillary     Status: Abnormal   Collection Time: 01/05/21  8:13 AM  Result Value Ref Range   Glucose-Capillary 364 (H) 70 - 99 mg/dL    Comment: Glucose reference range applies only to samples  taken after fasting for at least 8 hours.  Glucose, capillary     Status: Abnormal   Collection Time: 01/05/21 11:57 AM  Result Value Ref Range   Glucose-Capillary 266 (H) 70 - 99 mg/dL    Comment: Glucose reference range applies only to samples taken after fasting for at least 8 hours.   Comment 1 Document in Chart   Glucose, capillary     Status: Abnormal   Collection Time: 01/05/21  4:33 PM  Result Value Ref Range   Glucose-Capillary 101 (H) 70 - 99 mg/dL     Comment: Glucose reference range applies only to samples taken after fasting for at least 8 hours.  Glucose, capillary     Status: None   Collection Time: 01/05/21  9:22 PM  Result Value Ref Range   Glucose-Capillary 81 70 - 99 mg/dL    Comment: Glucose reference range applies only to samples taken after fasting for at least 8 hours.  CBC     Status: Abnormal   Collection Time: 01/06/21  5:29 AM  Result Value Ref Range   WBC 14.8 (H) 4.0 - 10.5 K/uL   RBC 3.20 (L) 3.87 - 5.11 MIL/uL   Hemoglobin 9.8 (L) 12.0 - 15.0 g/dL   HCT 16.1 (L) 09.6 - 04.5 %   MCV 99.1 80.0 - 100.0 fL   MCH 30.6 26.0 - 34.0 pg   MCHC 30.9 30.0 - 36.0 g/dL   RDW 40.9 81.1 - 91.4 %   Platelets 376 150 - 400 K/uL   nRBC 0.0 0.0 - 0.2 %    Comment: Performed at [facility], [address], [place], [place] [address]  Basic metabolic panel     Status: Abnormal   Collection Time: 01/06/21  5:29 AM  Result Value Ref Range   Sodium 139 135 - 145 mmol/L    Comment: DELTA CHECK NOTED   Potassium 4.0 3.5 - 5.1 mmol/L   Chloride 102 98 - 111 mmol/L   CO2 25 22 - 32 mmol/L   Glucose, Bld 136 (H) 70 - 99 mg/dL    Comment: Glucose reference range applies only to samples taken after fasting for at least 8 hours.   BUN 72 (H) 6 - 20 mg/dL   Creatinine, Ser 6.21 (H) 0.44 - 1.00 mg/dL   Calcium 8.5 (L) 8.9 - 10.3 mg/dL   GFR, Estimated 10 (L) >60 mL/min    Comment: (NOTE) Calculated using the CKD-EPI Creatinine Equation (2021)    Anion gap 12 5 - 15    Comment: Performed at [facility], [address], [place], [place] [address]  Magnesium     Status: None   Collection Time: 01/06/21  5:29 AM  Result Value Ref Range   Magnesium 2.1 1.7 - 2.4 mg/dL    Comment: Performed at [facility], [address], [place], [place] [address]  Phosphorus     Status: None   Collection Time: 01/06/21  5:29 AM  Result Value Ref Range   Phosphorus 4.4 2.5 - 4.6 mg/dL     Comment: Performed at [facility], [address], [place], [place] [address]  Glucose, capillary     Status: Abnormal   Collection Time: 01/06/21  7:59 AM  Result Value Ref Range   Glucose-Capillary 155 (H) 70 - 99 mg/dL    Comment: Glucose reference range applies only to samples taken after fasting for at least 8 hours.  Glucose, capillary  Status: Abnormal   Collection Time: 01/06/21 11:57 AM  Result Value Ref Range   Glucose-Capillary 212 (H) 70 - 99 mg/dL    Comment: Glucose reference range applies only to samples taken after fasting for at least 8 hours.  Glucose, capillary     Status: Abnormal   Collection Time: 01/06/21  5:25 PM  Result Value Ref Range   Glucose-Capillary 189 (H) 70 - 99 mg/dL    Comment: Glucose reference range applies only to samples taken after fasting for at least 8 hours.  Glucose, capillary     Status: Abnormal   Collection Time: 01/06/21  9:09 PM  Result Value Ref Range   Glucose-Capillary 222 (H) 70 - 99 mg/dL    Comment: Glucose reference range applies only to samples taken after fasting for at least 8 hours.  Renal function panel     Status: Abnormal   Collection Time: 01/07/21  6:45 AM  Result Value Ref Range   Sodium 140 135 - 145 mmol/L   Potassium 3.8 3.5 - 5.1 mmol/L   Chloride 103 98 - 111 mmol/L   CO2 27 22 - 32 mmol/L   Glucose, Bld 229 (H) 70 - 99 mg/dL    Comment: Glucose reference range applies only to samples taken after fasting for at least 8 hours.   BUN 54 (H) 6 - 20 mg/dL   Creatinine, Ser 1.613.91 (H) 0.44 - 1.00 mg/dL   Calcium 8.2 (L) 8.9 - 10.3 mg/dL   Phosphorus 3.8 2.5 - 4.6 mg/dL   Albumin 2.2 (L) 3.5 - 5.0 g/dL   GFR, Estimated 13 (L) >60 mL/min    Comment: (NOTE) Calculated using the CKD-EPI Creatinine Equation (2021)    Anion gap 10 5 - 15    Comment: Performed at [facility], [address] [place], [place] [address]  Glucose, capillary     Status: Abnormal   Collection  Time: 01/07/21  7:15 AM  Result Value Ref Range   Glucose-Capillary 193 (H) 70 - 99 mg/dL    Comment: Glucose reference range applies only to samples taken after fasting for at least 8 hours.  Glucose, capillary     Status: Abnormal   Collection Time: 01/07/21 11:21 AM  Result Value Ref Range   Glucose-Capillary 232 (H) 70 - 99 mg/dL    Comment: Glucose reference range applies only to samples taken after fasting for at least 8 hours.  Glucose, capillary     Status: Abnormal   Collection Time: 01/07/21  4:32 PM  Result Value Ref Range   Glucose-Capillary 262 (H) 70 - 99 mg/dL    Comment: Glucose reference range applies only to samples taken after fasting for at least 8 hours.  Glucose, capillary     Status: Abnormal   Collection Time: 01/07/21 10:09 PM  Result Value Ref Range   Glucose-Capillary 160 (H) 70 - 99 mg/dL    Comment: Glucose reference range applies only to samples taken after fasting for at least 8 hours.  Glucose, capillary     Status: Abnormal   Collection Time: 01/07/21 10:47 PM  Result Value Ref Range   Glucose-Capillary 160 (H) 70 - 99 mg/dL    Comment: Glucose reference range applies only to samples taken after fasting for at least 8 hours.  Renal function panel     Status: Abnormal   Collection Time: 01/08/21  4:23 AM  Result Value Ref Range   Sodium 143 135 - 145 mmol/L   Potassium 3.7  3.5 - 5.1 mmol/L   Chloride 107 98 - 111 mmol/L   CO2 27 22 - 32 mmol/L   Glucose, Bld 109 (H) 70 - 99 mg/dL    Comment: Glucose reference range applies only to samples taken after fasting for at least 8 hours.   BUN 41 (H) 6 - 20 mg/dL   Creatinine, Ser 7.82 (H) 0.44 - 1.00 mg/dL   Calcium 8.4 (L) 8.9 - 10.3 mg/dL   Phosphorus 3.5 2.5 - 4.6 mg/dL   Albumin 2.1 (L) 3.5 - 5.0 g/dL   GFR, Estimated 15 (L) >60 mL/min    Comment: (NOTE) Calculated using the CKD-EPI Creatinine Equation (2021)    Anion gap 9 5 - 15    Comment: Performed at [facility], [address] [place], [place] [address]  Glucose, capillary     Status: Abnormal   Collection Time: 01/08/21  7:58 AM  Result Value Ref Range   Glucose-Capillary 118 (H) 70 - 99 mg/dL    Comment: Glucose reference range applies only to samples taken after fasting for at least 8 hours.   Comment 1 Notify RN    Comment 2 Document in Chart   Glucose, capillary     Status: Abnormal   Collection Time: 01/08/21 11:31 AM  Result Value Ref Range   Glucose-Capillary 157 (H) 70 - 99 mg/dL    Comment: Glucose reference range applies only to samples taken after fasting for at least 8 hours.   Comment 1 Notify RN    Comment 2 Document in Chart

## 2021-01-08 NOTE — Progress Notes (Signed)
Inpatient Rehab Admissions Coordinator Note:   Per therapy recommendations, pt was screened for CIR candidacy by Harding Thomure, MS CCC-SLP. At this time, Pt. Appears to have functional decline and is a good candidate for CIR. Will place order for rehab consult per protocol.  Please contact me with questions.   Curlie Macken, MS, CCC-SLP Rehab Admissions Coordinator  [phone] (celll) [phone] (office)  

## 2021-01-08 NOTE — Progress Notes (Signed)
PROGRESS NOTE    Erin Davila  KGU:[id number] DOB: October 18, 1960 DOA: 01/03/2021 PCP: [name], MD    Brief Narrative:  This 60 year old female with history of insulin-dependent diabetes type 2, hypertension, morbid obesity who presented as direct admission from general surgery outpatient office for evaluation of necrotizing wound on the left buttock.  We were consulted for the management of hyperglycemia /AKI.  She recently had incision and drainage for her left gluteal abscess and was taking antibiotics at home. On presentation to the office at general surgery, wound was found to be necrotic with persistent drainage and therefore she was sent for direct admission.  On presentation she was severely hyperglycemic, lab work showed severe AKI with creatinine in the range of 4, no history of CKD.  Assessment & Plan:   Principal Problem:   Diabetes mellitus type 2 in obese [facility]) Active Problems:   HTN (hypertension)   Morbid obesity (HCC)   Cellulitis and abscess of buttock   Hyperglycemia due to diabetes mellitus (HCC)   Left buttock abscess  Diabetes type 2/ Hyperglycemia:  She takes 45 units Levemir insulin daily at home . She presented with severe hyperglycemia.   She was started on insulin drip initially, changed to Lantus and sliding scale.  Monitor blood sugars.   Hb A1c 12.5.  Increase Levemir to 45 units daily, change sliding scale to resistant. Diabetic consult appreciated.  Blood glucose improved.   AKI:  Creatinine in the range of 4 on presentation.  She takes olmesartan at home.  No history of CKD.   AKI is most likely prerenal secondary to polyuria causing volume depletion.   Renal Ultrasound did not show any obstruction.  continued on IV fluids, slight improvement in creatinine. Discontinue IV fluids, encourage oral intake,   Avoid nephrotoxins. Continue foley catheter. Nephro: No indication for hemodialysis.  Patient is making urine. Creatinine is  improving.    Severe sepsis/septic shock:  She presented with severe leukocytosis, hypotension.  Continue IV antibiotics.  Blood pressure is better now.   Necrotizing left gluteal wound:   Management as per general surgery.  Continue antibiotics, follow cultures.   Hypertension: She takes olmesartan.  Currently on hold due to AKI.  Monitor blood pressure.     Morbid obesity: BMI of 57. Diet counseling completed.   Hypokalemia: Replaced . Continue to monitor.    Subjective: Patient was seen and examined at bedside,  overnight events noted.  She reports feeling better. Patient reports pain in the buttock area, still reports foul smelling discharge.. Wound care is on the board.  Objective: Vitals:   01/07/21 1957 01/08/21 0500 01/08/21 0516 01/08/21 1342  BP: 136/71  (!) 145/82 (!) 143/65  Pulse: 95  86 92  Resp: 20  20 20   Temp: 99.2 F (37.3 C)  98.8 F (37.1 C) 98.1 F (36.7 C)  TempSrc: Oral  Oral Oral  SpO2: 100%  100% 100%  Weight:  (!) 175.9 kg    Height:        Intake/Output Summary (Last 24 hours) at 01/08/2021 1525 Last data filed at 01/08/2021 1139 Gross per 24 hour  Intake 929.64 ml  Output 1810 ml  Net -880.36 ml   Filed Weights   01/06/21 1236 01/07/21 0352 01/08/21 0500  Weight: (!) 173.7 kg (!) 178.9 kg (!) 175.9 kg    Examination:  General exam: Appears calm and comfortable, not in any acute distress. Respiratory system: Clear to auscultation. Respiratory effort normal. Cardiovascular system: S1 & S2  heard, RRR. No JVD, murmurs, rubs, gallops or clicks. No pedal edema. Gastrointestinal system: Abdomen is nondistended, soft and nontender. No organomegaly or masses felt.  Normal bowel sounds heard. Central nervous system: Alert and oriented. No focal neurological deficits. Extremities: Symmetric 5 x 5 power. Skin: There is foul-smelling wound noted on left buttock. Dressing noted. Psychiatry: Judgement and insight appear normal. Mood & affect appropriate.      Data Reviewed: I have personally reviewed following labs and imaging studies  CBC: Recent Labs  Lab 01/03/21 2021 01/04/21 0228 01/05/21 0543 01/06/21 0529  WBC 15.5* 14.3* 17.2* 14.8*  NEUTROABS 13.0*  --  14.3*  --   HGB 11.0* 11.4* 9.7* 9.8*  HCT 34.3* 35.9* 30.8* 31.7*  MCV 95.5 95.7 97.2 99.1  PLT 376 410* 364 376   Basic Metabolic Panel: Recent Labs  Lab 01/04/21 0957 01/05/21 0543 01/06/21 0529 01/07/21 0645 01/08/21 0423  NA 138 131* 139 140 143  K 3.4* 3.8 4.0 3.8 3.7  CL 99 95* 102 103 107  CO2 27 24 25 27 27   GLUCOSE 162* 410* 136* 229* 109*  BUN 76* 79* 72* 54* 41*  CREATININE 4.75* 4.91* 4.72* 3.91* 3.38*  CALCIUM 9.0 8.2* 8.5* 8.2* 8.4*  MG  --   --  2.1  --   --   PHOS  --   --  4.4 3.8 3.5   GFR: Estimated Creatinine Clearance: 31.1 mL/min (A) (by C-G formula based on SCr of 3.38 mg/dL (H)). Liver Function Tests: Recent Labs  Lab 01/03/21 2021 01/07/21 0645 01/08/21 0423  AST 11*  --   --   ALT 15  --   --   ALKPHOS 188*  --   --   BILITOT 0.8  --   --   PROT 8.2*  --   --   ALBUMIN 2.7* 2.2* 2.1*   No results for input(s): LIPASE, AMYLASE in the last 168 hours. No results for input(s): AMMONIA in the last 168 hours. Coagulation Profile: No results for input(s): INR, PROTIME in the last 168 hours. Cardiac Enzymes: No results for input(s): CKTOTAL, CKMB, CKMBINDEX, TROPONINI in the last 168 hours. BNP (last 3 results) No results for input(s): PROBNP in the last 8760 hours. HbA1C: No results for input(s): HGBA1C in the last 72 hours.  CBG: Recent Labs  Lab 01/07/21 1632 01/07/21 2209 01/07/21 2247 01/08/21 0758 01/08/21 1131  GLUCAP 262* 160* 160* 118* 157*   Lipid Profile: No results for input(s): CHOL, HDL, LDLCALC, TRIG, CHOLHDL, LDLDIRECT in the last 72 hours. Thyroid Function Tests: No results for input(s): TSH, T4TOTAL, FREET4, T3FREE, THYROIDAB in the last 72 hours. Anemia Panel: No results for input(s):  VITAMINB12, FOLATE, FERRITIN, TIBC, IRON, RETICCTPCT in the last 72 hours. Sepsis Labs: No results for input(s): PROCALCITON, LATICACIDVEN in the last 168 hours.  Recent Results (from the past 240 hour(s))  Surgical PCR screen     Status: None   Collection Time: 01/03/21  9:00 PM   Specimen: Nasal Mucosa; Nasal Swab  Result Value Ref Range Status   MRSA, PCR NEGATIVE NEGATIVE Final   Staphylococcus aureus NEGATIVE NEGATIVE Final    Comment: (NOTE) The Xpert SA Assay (FDA approved for NASAL specimens in patients 46 years of age and older), is one component of a comprehensive surveillance program. It is not intended to diagnose infection nor to guide or monitor treatment. Performed at [facility], [address], [place], [place]   SARS Coronavirus 2  by RT PCR (hospital order, performed in [facility] hospital lab) Nasopharyngeal Nasal Mucosa     Status: None   Collection Time: 01/03/21  9:13 PM   Specimen: Nasal Mucosa; Nasopharyngeal  Result Value Ref Range Status   SARS Coronavirus 2 NEGATIVE NEGATIVE Final    Comment: (NOTE) SARS-CoV-2 target nucleic acids are NOT DETECTED.  The SARS-CoV-2 RNA is generally detectable in upper and lower respiratory specimens during the acute phase of infection. The lowest concentration of SARS-CoV-2 viral copies this assay can detect is 250 copies / mL. A negative result does not preclude SARS-CoV-2 infection and should not be used as the sole basis for treatment or other patient management decisions.  A negative result may occur with improper specimen collection / handling, submission of specimen other than nasopharyngeal swab, presence of viral mutation(s) within the areas targeted by this assay, and inadequate number of viral copies (<250 copies / mL). A negative result must be combined with clinical observations, patient history, and epidemiological information.  Fact Sheet for Patients:    BoilerBrush.com.cy  Fact Sheet for Healthcare Providers: https://pope.com/  This test is not yet approved or  cleared by the [place] FDA and has been authorized for detection and/or diagnosis of SARS-CoV-2 by FDA under an Emergency Use Authorization (EUA).  This EUA will remain in effect (meaning this test can be used) for the duration of the COVID-19 declaration under Section 564(b)(1) of the Act, 21 U.S.C. section 360bbb-3(b)(1), unless the authorization is terminated or revoked sooner.  Performed at [facility], [address] [place], [place] [address]       Radiology Studies: No results found.  Scheduled Meds:  Chlorhexidine Gluconate Cloth  6 each Topical Daily   insulin aspart  0-20 Units Subcutaneous TID WC   insulin aspart  0-5 Units Subcutaneous QHS   insulin glargine  45 Units Subcutaneous Daily   mouth rinse  15 mL Mouth Rinse BID   Continuous Infusions:  sodium chloride 250 mL (01/05/21 0310)   sodium chloride Stopped (01/04/21 1107)   piperacillin-tazobactam (ZOSYN)  IV 3.375 g (01/08/21 1032)     LOS: 5 days    Time spent: 25 mins    Leyton Brownlee, MD [facility]   If 7PM-7AM, please contact night-coverage

## 2021-01-08 NOTE — Progress Notes (Signed)
ANTICOAGULATION CONSULT NOTE - Initial Consult  Pharmacy Consult for enoxaparin Indication: VTE prophylaxis  Allergies  Allergen Reactions   Lactose Other (See Comments)    GI complaints    Patient Measurements: Height: 5\' 8"  (172.7 cm) Weight: (!) 175.9 kg (387 lb 12.6 oz) IBW/kg (Calculated) : 63.9 Heparin Dosing Weight: n/a  Vital Signs: Temp: 98.1 F (36.7 C) (07/05 1342) Temp Source: Oral (07/05 1342) BP: 134/65 (07/05 1621) Pulse Rate: 89 (07/05 1621)  Labs: Recent Labs    01/06/21 0529 01/07/21 0645 01/08/21 0423  HGB 9.8*  --   --   HCT 31.7*  --   --   PLT 376  --   --   CREATININE 4.72* 3.91* 3.38*    Estimated Creatinine Clearance: 31.1 mL/min (A) (by C-G formula based on SCr of 3.38 mg/dL (H)).   Medical History: Past Medical History:  Diagnosis Date   Allergy    Diabetes mellitus without complication (HCC)    Hyperlipidemia    Hypertension     Medications:  Facility-Administered Medications Prior to Admission  Medication Dose Route Frequency Provider Last Rate Last Admin   methylPREDNISolone acetate (DEPO-MEDROL) injection 40 mg  40 mg Intra-articular Once Le, Thao P, DO       methylPREDNISolone acetate (DEPO-MEDROL) injection 40 mg  40 mg Intramuscular Once Le, Thao P, DO       Medications Prior to Admission  Medication Sig Dispense Refill Last Dose   celecoxib (CELEBREX) 200 MG capsule Take 200 mg by mouth daily.   Past Week   [EXPIRED] doxycycline (VIBRAMYCIN) 100 MG capsule Take 1 capsule (100 mg total) by mouth 2 (two) times daily for 10 days. 20 capsule 0 01/01/2021   LEVEMIR FLEXTOUCH 100 UNIT/ML FlexPen Inject 45 Units into the skin at bedtime.   >10 days ago   metFORMIN (GLUCOPHAGE) 1000 MG tablet Take 1,000 mg by mouth 2 (two) times daily.   Past Week   montelukast (SINGULAIR) 10 MG tablet Take 10 mg by mouth at bedtime.   Past Week   Multiple Vitamin (MULTIVITAMIN) tablet Take 1 tablet by mouth daily.   Past Week    olmesartan-hydrochlorothiazide (BENICAR HCT) 40-25 MG per tablet Take 1 tablet by mouth daily.   Past Week   simvastatin (ZOCOR) 40 MG tablet Take 40 mg by mouth at bedtime.   Past Week   diclofenac (VOLTAREN) 75 MG EC tablet TAKE 1 TABLET (75 MG TOTAL) BY MOUTH 2 (TWO) TIMES DAILY. (Patient not taking: Reported on 01/05/2021) 180 tablet 0 Not Taking   traMADol (ULTRAM) 50 MG tablet Take 1 tablet (50 mg total) by mouth every 8 (eight) hours as needed. (Patient not taking: No sig reported) 30 tablet 0 Not Taking   traMADol (ULTRAM) 50 MG tablet Take 1 tablet (50 mg total) by mouth every 8 (eight) hours as needed. PATIENT NEEDS OFFICE VISIT FOR ADDITIONAL REFILLS (Patient not taking: No sig reported) 30 tablet 0 Not Taking   Scheduled:   Chlorhexidine Gluconate Cloth  6 each Topical Daily   enoxaparin (LOVENOX) injection  30 mg Subcutaneous Q12H   insulin aspart  0-20 Units Subcutaneous TID WC   insulin aspart  0-5 Units Subcutaneous QHS   insulin glargine  45 Units Subcutaneous Daily   mouth rinse  15 mL Mouth Rinse BID   Assessment: 60 y.o. female admitted 01/03/2021 for   CBC: Hgb low but stable; Plt stable WNL SCr: no baseline for comparison; elevated on admission though improving Previous anticoagulation: none  Goal of Therapy: Prevention of VTE  Plan: Lovenox 30 mg SQ q12 hr - would typically give obese perioperative patients 40 mg SQ bid with BMI > 40, but with borderline SCr will dose slightly lower for now; can consider increasing to 40 mg bid if SCr continues to improve Monitor for signs of bleeding or worsening thrombosis   Bernadene Person, PharmD, BCPS [phone] 01/08/2021, 5:02 PM

## 2021-01-08 NOTE — Progress Notes (Signed)
Inpatient Rehabilitation Admissions Coordinator   Inpatient rehab consult received. I spoke with patient by phone to discuss goals and expectations of a possible  Cir admit pending insurance approval. She is in agreement to any therapy services to improve her mobility. I await OT eval to begin insurance Auth.  Ottie Glazier, RN, MSN Rehab Admissions Coordinator [phone] 01/08/2021 2:07 PM

## 2021-01-09 LAB — CBC
HCT: 26.7 % — ABNORMAL LOW (ref 36.0–46.0)
Hemoglobin: 8.1 g/dL — ABNORMAL LOW (ref 12.0–15.0)
MCH: 30.8 pg (ref 26.0–34.0)
MCHC: 30.3 g/dL (ref 30.0–36.0)
MCV: 101.5 fL — ABNORMAL HIGH (ref 80.0–100.0)
Platelets: 402 10*3/uL — ABNORMAL HIGH (ref 150–400)
RBC: 2.63 MIL/uL — ABNORMAL LOW (ref 3.87–5.11)
RDW: 13.5 % (ref 11.5–15.5)
WBC: 13.3 10*3/uL — ABNORMAL HIGH (ref 4.0–10.5)
nRBC: 0 % (ref 0.0–0.2)

## 2021-01-09 LAB — GLUCOSE, CAPILLARY
Glucose-Capillary: 117 mg/dL — ABNORMAL HIGH (ref 70–99)
Glucose-Capillary: 149 mg/dL — ABNORMAL HIGH (ref 70–99)
Glucose-Capillary: 184 mg/dL — ABNORMAL HIGH (ref 70–99)
Glucose-Capillary: 187 mg/dL — ABNORMAL HIGH (ref 70–99)

## 2021-01-09 LAB — RENAL FUNCTION PANEL
Albumin: 2.1 g/dL — ABNORMAL LOW (ref 3.5–5.0)
Anion gap: 9 (ref 5–15)
BUN: 34 mg/dL — ABNORMAL HIGH (ref 6–20)
CO2: 28 mmol/L (ref 22–32)
Calcium: 8.3 mg/dL — ABNORMAL LOW (ref 8.9–10.3)
Chloride: 106 mmol/L (ref 98–111)
Creatinine, Ser: 3.34 mg/dL — ABNORMAL HIGH (ref 0.44–1.00)
GFR, Estimated: 15 mL/min — ABNORMAL LOW (ref >60)
Glucose, Bld: 142 mg/dL — ABNORMAL HIGH (ref 70–99)
Phosphorus: 3.1 mg/dL (ref 2.5–4.6)
Potassium: 3.7 mmol/L (ref 3.5–5.1)
Sodium: 143 mmol/L (ref 135–145)

## 2021-01-09 MED ORDER — OXYCODONE HCL 5 MG PO TABS: 15.0000 mg | ORAL_TABLET | ORAL | Status: DC

## 2021-01-09 MED ORDER — FENTANYL CITRATE (PF) 100 MCG/2ML IJ SOLN
25.0000 ug | Freq: Once | INTRAMUSCULAR | Status: DC
  Filled 2021-01-09: qty 2

## 2021-01-09 NOTE — Progress Notes (Signed)
Physical Therapy Treatment Patient Details Name: Erin Davila MRN: [id number] DOB: [birth year] Today's Date: 01/09/2021    History of Present Illness 60 year old with infected L buttocks absess with I& D 01/04/2021. PMH: DM and HTN    PT Comments    Pt reports she's having severe buttock wound pain and that she'd like to change positions. Assisted pt with rolling L and R x 2 each for repositioning, then to place bedpan under her. Pt declined further mobility as she needs to have a BM and is in too much pain. Wound VAC tube pulled away from sponge during rolling, RN notified and will reconnect the tube. Will follow.    Follow Up Recommendations  CIR     Equipment Recommendations  None recommended by PT    Recommendations for Other Services       Precautions / Restrictions Precautions Precautions: None Precaution Comments: wound vac in place on L buttocks Restrictions Weight Bearing Restrictions: No    Mobility  Bed Mobility Overal bed mobility: Needs Assistance Bed Mobility: Rolling Rolling: Min assist         General bed mobility comments: significant time needed for all bed mobility due to pain, assisted pt with rolling R and L x 2 for positioning in bed then for placement of bed pan, pt used bed rail and used therapist's hand to pull self into position.    Transfers                 General transfer comment: pt declined transfers 2* needing to have a BM and being in too much pain  Ambulation/Gait                 Stairs             Wheelchair Mobility    Modified Rankin (Stroke Patients Only)       Balance                                            Cognition Arousal/Alertness: Awake/alert Behavior During Therapy: WFL for tasks assessed/performed Overall Cognitive Status: Within Functional Limits for tasks assessed                                        Exercises      General Comments         Pertinent Vitals/Pain Pain Score: 10-Worst pain ever Pain Location: buttock with bed mobility Pain Descriptors / Indicators: Moaning;Discomfort Pain Intervention(s): Limited activity within patient's tolerance;Monitored during session;Premedicated before session;Patient requesting pain meds-RN notified    Home Living                      Prior Function            PT Goals (current goals can now be found in the care plan section) Acute Rehab PT Goals Patient Stated Goal: less pain PT Goal Formulation: With patient Time For Goal Achievement: 01/19/21 Potential to Achieve Goals: Good Progress towards PT goals: Progressing toward goals    Frequency    Min 3X/week      PT Plan Current plan remains appropriate    Co-evaluation              AM-PAC PT "6 Clicks" Mobility  Outcome Measure  Help needed turning from your back to your side while in a flat bed without using bedrails?: A Little Help needed moving from lying on your back to sitting on the side of a flat bed without using bedrails?: A Lot Help needed moving to and from a bed to a chair (including a wheelchair)?: A Lot Help needed standing up from a chair using your arms (e.g., wheelchair or bedside chair)?: A Lot Help needed to walk in hospital room?: A Lot Help needed climbing 3-5 steps with a railing? : A Lot 6 Click Score: 13    End of Session Equipment Utilized During Treatment: Gait belt Activity Tolerance: Patient limited by pain Patient left: in bed;with call bell/phone within reach;with bed alarm set Nurse Communication: Mobility status PT Visit Diagnosis: Other abnormalities of gait and mobility (R26.89);Pain     Time: 1417-1430 PT Time Calculation (min) (ACUTE ONLY): 13 min  Charges:  $Therapeutic Activity: 8-22 mins                    Ralene Bathe Kistler PT 01/09/2021  Acute Rehabilitation Services Pager [phone] Office [phone]

## 2021-01-09 NOTE — Progress Notes (Signed)
Pharmacy Antibiotic Note  Erin Davila is a 60 y.o. female admitted on 01/03/2021 with necrotizing draining wound infection of left buttock.  Pharmacy has been consulted for Zosyn dosing.  Today, 01/09/21 WBC remains slightly elevated but last checked on 7/3. Dexamethasone x1 dose given on 7/1 SCr 3.34 slowing improving: AKI, nephrology following Afebrile  Today is day #6 IV antibiotics. S/p I&D on 7/1 with wound vac changes MWF  Plan: Continue piperacillin/tazobactam 3.375 g IV q8h EI Monitor renal function for necessary dose adjustments   Height: 5\' 8"  (172.7 cm) Weight: (!) 179.4 kg (395 lb 8.1 oz) IBW/kg (Calculated) : 63.9  Temp (24hrs), Avg:98.3 F (36.8 C), Min:98.1 F (36.7 C), Max:98.7 F (37.1 C)  Recent Labs  Lab 01/03/21 2021 01/04/21 0228 01/04/21 0543 01/05/21 0543 01/06/21 0529 01/07/21 0645 01/08/21 0423 01/09/21 0425  WBC 15.5* 14.3*  --  17.2* 14.8*  --   --   --   CREATININE 4.47* 4.53*   < > 4.91* 4.72* 3.91* 3.38* 3.34*   < > = values in this interval not displayed.     Estimated Creatinine Clearance: 31.9 mL/min (A) (by C-G formula based on SCr of 3.34 mg/dL (H)).    Allergies  Allergen Reactions   Lactose Other (See Comments)    GI complaints     Thank you for allowing pharmacy to be a part of this patient's care.  03/12/21, PharmD, BCPS Secure Chat if ?s 01/09/2021 7:32 AM

## 2021-01-09 NOTE — Progress Notes (Signed)
Inpatient Rehabilitation Admissions Coordinator   I have received OT eval and will begin Auth with [employer] for possible Cir admit. I spoke with patient by phone and she is in agreement.  Ottie Glazier, RN, MSN Rehab Admissions Coordinator [phone] 01/09/2021 11:49 AM

## 2021-01-09 NOTE — Evaluation (Signed)
Occupational Therapy Evaluation Patient Details Name: Erin Davila MRN: [id number] DOB: 1960/07/17 Today's Date: 01/09/2021    History of Present Illness 60 year old with infected L buttocks abscess with I& D 01/04/2021. PMH: DM and HTN   Clinical Impression   Patient lives alone in a single level house with 2 steps to enter, independent at baseline with self care/IADLs. Patient reports son was having to help as pain got worse from abscess.  Currently patient is limited by pain impacting overall activity tolerance, balance and safety. Patient needing increased time for all mobility, min G assist with cues for hand placement for sit to stand and supervision standing at sink side to wash her face. Patient able to don/doff socks and shoes at edge of bed during session with significant time due to buttock pain. Anticipate patient will progress quickly with self care tasks and would be a good candidate for CIR pending pain control. Acute OT to follow.     Follow Up Recommendations  CIR;Other (comment) (vs SNF pending pain tolerance)    Equipment Recommendations  Tub/shower seat       Precautions / Restrictions Precautions Precautions: None Precaution Comments: wound vac in place on L buttocks Restrictions Weight Bearing Restrictions: No      Mobility Bed Mobility Overal bed mobility: Needs Assistance Bed Mobility: Supine to Sit;Sit to Supine     Supine to sit: Min guard Sit to supine: Min assist   General bed mobility comments: significant time needed for all bed mobility due to pain, min A to lift R leg back onto bed.    Transfers Overall transfer level: Needs assistance Equipment used: Rolling walker (2 wheeled) Transfers: Sit to/from Stand Sit to Stand: Min guard         General transfer comment: cue patient to push from bed instead of pulling on walker to stand, needing significant time "I'm intimidated by the pain" and min G for safety    Balance Overall balance  assessment: Needs assistance Sitting-balance support: Feet supported Sitting balance-Leahy Scale: Good     Standing balance support: Bilateral upper extremity supported Standing balance-Leahy Scale: Poor Standing balance comment: reliant on UE support                           ADL either performed or assessed with clinical judgement   ADL Overall ADL's : Needs assistance/impaired Eating/Feeding: Independent;Bed level   Grooming: Wash/dry face;Supervision/safety;Standing Grooming Details (indicate cue type and reason): patient leans heavily onto sink for support to wash her face in standing, reports "a little light headed" Upper Body Bathing: Set up;Sitting;Bed level   Lower Body Bathing: Minimal assistance;Sitting/lateral leans;Sit to/from stand   Upper Body Dressing : Set up;Sitting   Lower Body Dressing: Set up;Minimal assistance;Sitting/lateral leans;Sit to/from stand Lower Body Dressing Details (indicate cue type and reason): from seated position and significant time patient is able to don/doff socks and shoes during session, reports pain in buttock. min A in standing for safety Toilet Transfer: Min IT sales professional Details (indicate cue type and reason): min G for safety with managing walker, able to take few steps to/from sink with significant time to stand Toileting- Clothing Manipulation and Hygiene: Maximal assistance;Sitting/lateral lean;Sit to/from stand       Functional mobility during ADLs: Min guard;Rolling walker General ADL Comments: patient needing increased assistance for self care tasks due to pain limiting her overall activity tolerance, balance, safety      Pertinent Vitals/Pain  Pain Assessment: Faces Faces Pain Scale: Hurts whole lot Pain Location: buttock with bed mobility Pain Descriptors / Indicators: Moaning;Discomfort Pain Intervention(s): Monitored during session     Hand Dominance Right   Extremity/Trunk  Assessment Upper Extremity Assessment Upper Extremity Assessment: Overall WFL for tasks assessed   Lower Extremity Assessment Lower Extremity Assessment: Defer to PT evaluation   Cervical / Trunk Assessment Cervical / Trunk Assessment: Other exceptions Cervical / Trunk Exceptions: body habitus   Communication Communication Communication: No difficulties   Cognition Arousal/Alertness: Awake/alert Behavior During Therapy: WFL for tasks assessed/performed Overall Cognitive Status: Within Functional Limits for tasks assessed                                                Home Living Family/patient expects to be discharged to:: Private residence Living Arrangements: Alone Available Help at Discharge: Family Type of Home: House Home Access: Stairs to enter Secretary/administrator of Steps: 2   Home Layout: One level     Bathroom Shower/Tub: Chief Strategy Officer: Handicapped height     Home Equipment: None          Prior Functioning/Environment Level of Independence: Independent        Comments: leads the choir, very active and independent, drives and  works        OT Problem List: Pain;Obesity;Decreased safety awareness;Decreased knowledge of use of DME or AE;Decreased activity tolerance;Impaired balance (sitting and/or standing)      OT Treatment/Interventions: Self-care/ADL training;Balance training;Patient/family education;Therapeutic activities;DME and/or AE instruction    OT Goals(Current goals can be found in the care plan section) Acute Rehab OT Goals Patient Stated Goal: less pain OT Goal Formulation: With patient Time For Goal Achievement: 01/23/21 Potential to Achieve Goals: Good  OT Frequency: Min 2X/week    AM-PAC OT "6 Clicks" Daily Activity     Outcome Measure Help from another person eating meals?: None Help from another person taking care of personal grooming?: A Little Help from another person toileting, which  includes using toliet, bedpan, or urinal?: A Lot Help from another person bathing (including washing, rinsing, drying)?: A Little Help from another person to put on and taking off regular upper body clothing?: A Little Help from another person to put on and taking off regular lower body clothing?: A Little 6 Click Score: 18   End of Session Equipment Utilized During Treatment: Rolling walker Nurse Communication: Mobility status  Activity Tolerance: Patient limited by pain;Patient tolerated treatment well Patient left: in bed;with call bell/phone within reach;with nursing/sitter in room  OT Visit Diagnosis: Pain;Unsteadiness on feet (R26.81);Other abnormalities of gait and mobility (R26.89) Pain - part of body:  (buttock)                Time: 9833-8250 OT Time Calculation (min): 39 min Charges:  OT General Charges $OT Visit: 1 Visit OT Evaluation $OT Eval Low Complexity: 1 Low OT Treatments $Self Care/Home Management : 23-37 mins  Marlyce Huge OT OT pager: [phone]  Carmelia Roller 01/09/2021, 10:57 AM

## 2021-01-09 NOTE — PMR Pre-admission (Signed)
PMR Admission Coordinator Pre-Admission Assessment  Patient: Erin Davila is an 60 y.o., female MRN: [id number] DOB: 12/07/1960 Height: 5\' 8"  (172.7 cm) Weight: (!) 179.4 kg  Insurance Information HMO:     PPO:      PCP:      IPA:      80/20:      OTHER:  PRIMARY: [employer]      Policy#:      Subscriber: pt CM Name: [id number]      Phone#: [phone]     Fax#: [phone] Pre-Cert#: [id number] for 7 days      Employer: [id number] [employer] Benefits:  Phone #: [phone]     Name: 7/6 Eff. Date: 07/2007     Deduct: $500      Out of Pocket Max: $2000      Life Max: none CIR: 80%      SNF: 80% Outpatient: 80%     Co-Pay: 20% Home Health: 80%      Co-Pay: 120 visits per year DME: 80%     Co-Pay: 20% Providers: pt choice  SECONDARY: none  Financial Counselor:       Phone#:   The 08/2007 Information Summary" for patients in Inpatient Rehabilitation Facilities with attached "Privacy Act Statement-Health Care Records" was provided and verbally reviewed with: N/A  Emergency Contact Information Contact Information     Name Relation Home Work Mobile   [name] Sister [phone]  [phone]       Current Medical History  Patient Admitting Diagnosis: Debility  History of Present Illness: 60 year old right-handed female with history of insulin-dependent diabetes mellitus hypertension morbid obesity with BMI 60.14, CKD with baseline creatinine 2.0.  Presented 01/03/2021 after a direct admission from general surgery outpatient office for evaluation of necrotizing wound on the left buttock.  Patient recently had incision and drainage for left gluteal abscess was taking home antibiotics.  On presentation to the office of general surgery wound was found to be necrotic with persistent drainage and therefore she was sent for direct admission.  Admission chemistry sodium 129, glucose 685, BUN 74, creatinine 4.47, WBC 15,500, hemoglobin A1c 12.8, SARS coronavirus negative.   Renal services consulted for AKI with baseline creatinine 2.0.  Renal ultrasound showed no obstruction.  A Foley catheter tube was placed.  Maintained on gentle IV fluids with latest creatinine 3.17.  General surgery continues to follow patient and she did undergo sharp debridement incision and drainage 01/04/2021 per Dr. 03/07/2021.  Wound VAC was placed changed every Monday Wednesday Friday.  Patient remains on Zosyn for wound coverage.  Subcutaneous Lovenox for DVT prophylaxis.  Patient's medical record from [facility] has been reviewed by the rehabilitation admission coordinator and physician.  Past Medical History  Past Medical History:  Diagnosis Date   Allergy    Diabetes mellitus without complication (HCC)    Hyperlipidemia    Hypertension     Family History   family history is not on file.  Prior Rehab/Hospitalizations Has the patient had prior rehab or hospitalizations prior to admission? Yes  Has the patient had major surgery during 100 days prior to admission? Yes   Current Medications  Current Facility-Administered Medications:    0.9 %  sodium chloride infusion, , Intravenous, PRN, [facility], PA-C, Last Rate: 10 mL/hr at 01/05/21 0310, 250 mL at 01/05/21 0310   0.9 %  sodium chloride infusion, , Intravenous, PRN, 03/08/21, PA-C, Stopped at 01/04/21 1107   acetaminophen (TYLENOL) tablet 650 mg,  650 mg, Oral, Q6H PRN **OR** acetaminophen (TYLENOL) suppository 650 mg, 650 mg, Rectal, Q6H PRN, Maczis, Elmer Sow, PA-C   Chlorhexidine Gluconate Cloth 2 % PADS 6 each, 6 each, Topical, Daily, Maczis, Elmer Sow, PA-C, 6 each at 01/09/21 1100   dextrose 50 % solution 0-50 mL, 0-50 mL, Intravenous, PRN, Maczis, Elmer Sow, PA-C   diphenhydrAMINE (BENADRYL) capsule 25 mg, 25 mg, Oral, Q6H PRN **OR** diphenhydrAMINE (BENADRYL) injection 25 mg, 25 mg, Intravenous, Q6H PRN, Maczis, Michael M, PA-C   enoxaparin (LOVENOX) injection 30 mg, 30 mg, Subcutaneous, Q12H,  Wofford, Drew A, RPH, 30 mg at 01/09/21 2353   fentaNYL (SUBLIMAZE) injection 25 mcg, 25 mcg, Intravenous, Once, [employer], PA-C   hydrALAZINE (APRESOLINE) injection 10 mg, 10 mg, Intravenous, Q2H PRN, Maczis, Michael M, PA-C   hyoscyamine (LEVSIN) 0.125 MG/5ML elixir 0.25 mg, 0.25 mg, Oral, Q6H PRN, Jacinto Halim, PA-C, 0.25 mg at 01/04/21 0332   insulin aspart (novoLOG) injection 0-20 Units, 0-20 Units, Subcutaneous, TID WC, Maczis, Elmer Sow, PA-C, 4 Units at 01/09/21 1708   insulin aspart (novoLOG) injection 0-5 Units, 0-5 Units, Subcutaneous, QHS, Maczis, Elmer Sow, PA-C, 2 Units at 01/06/21 2135   insulin glargine (LANTUS) injection 45 Units, 45 Units, Subcutaneous, Daily, Cipriano Bunker, MD, 45 Units at 01/09/21 0954   lip balm (CARMEX) ointment, , Topical, PRN, Jacinto Halim, PA-C, Given at 01/03/21 2354   MEDLINE mouth rinse, 15 mL, Mouth Rinse, BID, Maczis, Elmer Sow, PA-C, 15 mL at 01/09/21 0955   methocarbamol (ROBAXIN) tablet 500 mg, 500 mg, Oral, Q6H PRN, Jacinto Halim, PA-C, 500 mg at 01/09/21 0813   morphine 2 MG/ML injection 2 mg, 2 mg, Intravenous, Q2H PRN, Jacinto Halim, PA-C, 2 mg at 01/10/21 0511   ondansetron (ZOFRAN-ODT) disintegrating tablet 4 mg, 4 mg, Oral, Q6H PRN **OR** ondansetron (ZOFRAN) injection 4 mg, 4 mg, Intravenous, Q6H PRN, Jacinto Halim, PA-C, 4 mg at 01/08/21 1717   [START ON 01/11/2021] oxyCODONE (Oxy IR/ROXICODONE) immediate release tablet 15 mg, 15 mg, Oral, Q M,W,F, Juliet Rude, PA-C   oxyCODONE (Oxy IR/ROXICODONE) immediate release tablet 5-10 mg, 5-10 mg, Oral, Q4H PRN, Jacinto Halim, PA-C, 10 mg at 01/09/21 2048   piperacillin-tazobactam (ZOSYN) IVPB 3.375 g, 3.375 g, Intravenous, Q8H, Maczis, Elmer Sow, PA-C, Last Rate: 12.5 mL/hr at 01/09/21 2355, 3.375 g at 01/09/21 2355  Patients Current Diet:  Diet Order             Diet Carb Modified Fluid consistency: Thin; Room service appropriate? Yes  Diet effective now                    Precautions / Restrictions Precautions Precautions: None Precaution Comments: wound vac in place on L buttocks Restrictions Weight Bearing Restrictions: No   Has the patient had 2 or more falls or a fall with injury in the past year? No  Prior Activity Level Community (5-7x/wk): independent and driving  Prior Functional Level Self Care: Did the patient need help bathing, dressing, using the toilet or eating? Independent  Indoor Mobility: Did the patient need assistance with walking from room to room (with or without device)? Independent  Stairs: Did the patient need assistance with internal or external stairs (with or without device)? Independent  Functional Cognition: Did the patient need help planning regular tasks such as shopping or remembering to take medications? Independent  Home Assistive Devices / Equipment Home Assistive Devices/Equipment: Cane (specify quad or straight) Home  Equipment: None  Prior Device Use: Indicate devices/aids used by the patient prior to current illness, exacerbation or injury? None of the above  Current Functional Level Cognition  Overall Cognitive Status: Within Functional Limits for tasks assessed Orientation Level: Oriented X4    Extremity Assessment (includes Sensation/Coordination)  Upper Extremity Assessment: Overall WFL for tasks assessed  Lower Extremity Assessment: Defer to PT evaluation    ADLs  Overall ADL's : Needs assistance/impaired Eating/Feeding: Independent, Bed level Grooming: Wash/dry face, Supervision/safety, Standing Grooming Details (indicate cue type and reason): patient leans heavily onto sink for support to wash her face in standing, reports "a little light headed" Upper Body Bathing: Set up, Sitting, Bed level Lower Body Bathing: Minimal assistance, Sitting/lateral leans, Sit to/from stand Upper Body Dressing : Set up, Sitting Lower Body Dressing: Set up, Minimal assistance, Sitting/lateral  leans, Sit to/from stand Lower Body Dressing Details (indicate cue type and reason): from seated position and significant time patient is able to don/doff socks and shoes during session, reports pain in buttock. min A in standing for safety Toilet Transfer: Min guard, RW, Ambulation Toilet Transfer Details (indicate cue type and reason): min G for safety with managing walker, able to take few steps to/from sink with significant time to stand Toileting- Clothing Manipulation and Hygiene: Maximal assistance, Sitting/lateral lean, Sit to/from stand Functional mobility during ADLs: Min guard, Rolling walker General ADL Comments: patient needing increased assistance for self care tasks due to pain limiting her overall activity tolerance, balance, safety    Mobility  Overal bed mobility: Needs Assistance Bed Mobility: Rolling Rolling: Min assist Supine to sit: Min guard Sit to supine: Min assist General bed mobility comments: significant time needed for all bed mobility due to pain, assisted pt with rolling R and L x 2 for positioning in bed then for placement of bed pan, pt used bed rail and used therapist's hand to pull self into position.    Transfers  Overall transfer level: Needs assistance Equipment used: Rolling walker (2 wheeled) Transfers: Sit to/from Stand Sit to Stand: Min guard General transfer comment: pt declined transfers 2* needing to have a BM and being in too much pain    Ambulation / Gait / Stairs / Wheelchair Mobility  Ambulation/Gait Ambulation/Gait assistance: Editor, commissioning (Feet): 6 Feet Assistive device: Rolling walker (2 wheeled) Gait Pattern/deviations: Step-to pattern, Antalgic, Decreased step length - right, Decreased step length - left General Gait Details: limited gait distance dut to pt requesting to sit down. Gait velocity: decr.    Posture / Balance Balance Overall balance assessment: Needs assistance Sitting-balance support: Feet  supported Sitting balance-Leahy Scale: Good Standing balance support: Bilateral upper extremity supported Standing balance-Leahy Scale: Poor Standing balance comment: reliant on UE support    Special needs/care consideration Date of Service:  01/09/2021  8:47 AM           Signed         Show:Clear all Written[x] Templated[] Copied  Added by: Dorthula Nettles, RN   Hover for details                          WOC Nurse wound follow up Patient receiving care in [facility]. Assisted with positioning for VAC change by primary RN, NT, and PA. Wound type: left buttock surgical Measurement: measured 7/4. Wound bed: nearly 100% red, clean Drainage (amount, consistency, odor) dark red in cannister Periwound: intact and protected by skin barrier film and barrier ring along  the lower border Dressing procedure/placement/frequency: One piece of black foam removed from main wound bed, one small piece of black foam removed from small wound below the main wound. Drape applied, immediate seal obtained.  Patient had received premedication before process began.   Patient to receive oral pain medication at 0800 on Friday for an 0900 VAC change with PA.  Primary RN aware. Nursing instruction order placed with this information as well.   Helmut MusterSherry Doty, RN, MSN, CWOCN, CNS-BC, pager [phone]              Hgb A1c 12.5  Previous Home Environment  Living Arrangements: Alone  Lives With: Alone ([place] [employer] [place] Available Help at Discharge: Family, Available PRN/intermittently Type of Home: House Home Layout: One level Home Access: Stairs to enter Entergy CorporationEntrance Stairs-Number of Steps: 2 Bathroom Shower/Tub: Engineer, manufacturing systemsTub/shower unit Bathroom Toilet: Handicapped height Bathroom Accessibility: Yes How Accessible: Accessible via walker Home Care Services: No  Discharge Living Setting Plans for Discharge Living Setting: Patient's home, Alone (son home from college for the  summer) Type of Home at Discharge: House Discharge Home Layout: One level Discharge Home Access: Stairs to enter Entrance Stairs-Rails: None Entrance Stairs-Number of Steps: 2 Discharge Bathroom Shower/Tub: Tub/shower unit Discharge Bathroom Toilet: Handicapped height Discharge Bathroom Accessibility: Yes How Accessible: Accessible via walker Does the patient have any problems obtaining your medications?: No  Social/Family/Support Systems Patient Roles: Parent (employee) Contact Information: sister Anticipated Caregiver: sister and son prn Anticipated Caregiver's Contact Information: see above Caregiver Availability: Intermittent Discharge Plan Discussed with Primary Caregiver: Yes Is Caregiver In Agreement with Plan?: Yes Does Caregiver/Family have Issues with Lodging/Transportation while Pt is in Rehab?: No  Goals Patient/Family Goal for Rehab: Mod I to supervision with PT and OT Expected length of stay: ELOS 7 to 10 days Pt/Family Agrees to Admission and willing to participate: Yes Program Orientation Provided & Reviewed with Pt/Caregiver Including Roles  & Responsibilities: Yes  Decrease burden of Care through IP rehab admission: n/a  Possible need for SNF placement upon discharge: not anticipated  Patient Condition: [facility] spoken with patient. I discussed via phone for inpatient rehabilitation assessment.  Patient will benefit from ongoing PT and OT, can actively participate in 3 hours of therapy a day 5 days of the week, and can make measurable gains during the admission.  Patient will also benefit from the coordinated team approach during an Inpatient Acute Rehabilitation admission.  The patient will receive intensive therapy as well as Rehabilitation physician, nursing, social worker, and care management interventions.  Due to bladder management, bowel management, safety, skin/wound care, disease management, medication  administration, pain management, and patient education the patient requires 24 hour a day rehabilitation nursing.  The patient is currently min to mod assist overall with mobility and basic ADLs.  Discharge setting and therapy post discharge at home with home health is anticipated.  Patient has agreed to participate in the Acute Inpatient Rehabilitation Program and will admit today.  Preadmission Screen Completed By:  Clois DupesBoyette, Barbara Godwin, 01/10/2021 10:22 AM ______________________________________________________________________   Discussed status with Dr. Carlis Abbottaulkar on  01/09/2021 at 1025 and received approval for admission today.  Admission Coordinator:  Clois DupesBoyette, Barbara Godwin, RN, time  [id number] Date  01/10/2021   Assessment/Plan: Diagnosis: Debility Does the need for close, 24 hr/day Medical supervision in concert with the patient's rehab needs make it unreasonable for this patient to be served in a less intensive setting? Yes Co-Morbidities requiring  supervision/potential complications: Type 2 DM, morbid obesity (BMI 60.14), HTN, cellulitis and buttock abscess, trochanteric bursitis of both hips, CKD Due to bladder management, bowel management, safety, skin/wound care, disease management, medication administration, pain management, and patient education, does the patient require 24 hr/day rehab nursing? Yes Does the patient require coordinated care of a physician, rehab nurse, PT, OT to address physical and functional deficits in the context of the above medical diagnosis(es)? Yes Addressing deficits in the following areas: balance, endurance, locomotion, strength, transferring, bowel/bladder control, bathing, dressing, feeding, grooming, toileting, and psychosocial support Can the patient actively participate in an intensive therapy program of at least 3 hrs of therapy 5 days a week? Yes The potential for patient to make measurable gains while on inpatient rehab is good Anticipated functional outcomes  upon discharge from inpatient rehab: modified independent PT, modified independent OT, modified independent SLP Estimated rehab length of stay to reach the above functional goals is: 2-3 weeks Anticipated discharge destination: Home 10. Overall Rehab/Functional Prognosis: good   MD Signature: Sula Soda, MD

## 2021-01-09 NOTE — Progress Notes (Signed)
   Progress Note  5 Days Post-Op  Subjective: Seen with WOC for VAC change. Patient required IV pain medication for this, we discussed transition to PO pain meds for VAC changes. Discussed possible CIR and patient would like to pursue this when medically ready.   Objective: Vital signs in last 24 hours: Temp:  [98.1 F (36.7 C)-98.7 F (37.1 C)] 98.7 F (37.1 C) (07/06 0501) Pulse Rate:  [83-96] 83 (07/06 0501) Resp:  [16-20] 20 (07/06 0501) BP: (110-143)/(65-83) 134/75 (07/06 0501) SpO2:  [98 %-100 %] 99 % (07/06 0501) Weight:  [179.4 kg] 179.4 kg (07/06 0500) Last BM Date: 01/08/21  Intake/Output from previous day: 07/05 0701 - 07/06 0700 In: 179.9 [I.V.:30; IV Piggyback:149.9] Out: 1875 [Urine:1800; Drains:75] Intake/Output this shift: No intake/output data recorded.  PE: Gen: NAD, cooperative CV: RRR Pulm: Normal work of breathing Back: wound as below, granulating well, no signs of ongoing cellulitis      Lab Results:  No results for input(s): WBC, HGB, HCT, PLT in the last 72 hours. BMET Recent Labs    01/08/21 0423 01/09/21 0425  NA 143 143  K 3.7 3.7  CL 107 106  CO2 27 28  GLUCOSE 109* 142*  BUN 41* 34*  CREATININE 3.38* 3.34*  CALCIUM 8.4* 8.3*   PT/INR No results for input(s): LABPROT, INR in the last 72 hours. CMP     Component Value Date/Time   NA 143 01/09/2021 0425   K 3.7 01/09/2021 0425   CL 106 01/09/2021 0425   CO2 28 01/09/2021 0425   GLUCOSE 142 (H) 01/09/2021 0425   BUN 34 (H) 01/09/2021 0425   CREATININE 3.34 (H) 01/09/2021 0425   CALCIUM 8.3 (L) 01/09/2021 0425   PROT 8.2 (H) 01/03/2021 2021   ALBUMIN 2.1 (L) 01/09/2021 0425   AST 11 (L) 01/03/2021 2021   ALT 15 01/03/2021 2021   ALKPHOS 188 (H) 01/03/2021 2021   BILITOT 0.8 01/03/2021 2021   GFRNONAA 15 (L) 01/09/2021 0425   Lipase  No results found for: LIPASE     Studies/Results: No results found.  Anti-infectives: Anti-infectives (From admission, onward)     Start     Dose/Rate Route Frequency Ordered Stop   01/03/21 2000  piperacillin-tazobactam (ZOSYN) IVPB 3.375 g        3.375 g 12.5 mL/hr over 240 Minutes Intravenous Every 8 hours 01/03/21 1908          Assessment/Plan Left Buttock abscess S/P incision and drainage 7/1 Dr. Daphine Deutscher - WOC following for [facility] changes - M/W/F - currently on Zosyn - check WBC today, may be able to DC abx - will try to transition to PO pain meds only for VAC change - planning around 0900 Friday  - PT recommending CIR, OT eval pending    FEN: CM diet VTE: SCDs, LMWH ID: [email or web address] 6/30>>   T2DM - per TRH and diabetes coordinator, improving AKI - Cr 3.34, UOP improving, nephrology was consulted as well HTN Morbid Obesity - BMI 58.96   Appreciate TRH assistance. CIR evaluation for possible admission pending. OT eval as well   LOS: 6 days    Juliet Rude, [facility] Surgery 01/09/2021, 9:13 AM Please see Amion for pager number during day hours 7:00am-4:30pm

## 2021-01-09 NOTE — Consult Note (Signed)
WOC Nurse wound follow up Patient receiving care in [facility]. Assisted with positioning for VAC change by primary RN, NT, and PA. Wound type: left buttock surgical Measurement: measured 7/4. Wound bed: nearly 100% red, clean Drainage (amount, consistency, odor) dark red in cannister Periwound: intact and protected by skin barrier film and barrier ring along the lower border Dressing procedure/placement/frequency: One piece of black foam removed from main wound bed, one small piece of black foam removed from small wound below the main wound. Drape applied, immediate seal obtained.  Patient had received premedication before process began.  Patient to receive oral pain medication at 0800 on Friday for an 0900 VAC change with PA.  Primary RN aware. Nursing instruction order placed with this information as well.  Erin Muster, RN, MSN, CWOCN, CNS-BC, pager [phone]

## 2021-01-09 NOTE — Progress Notes (Signed)
Nephrology Follow-Up Consult note   Assessment/Recommendations: Erin Davila is a/an 60 y.o. female with a past medical history significant for DM2, HTN, HLD, chronic wounds, admitted for chronic wound management and AKI.     Nonoliguric acute kidney injury: Likely multifactorial with dehydration, wounds, recent surgery, NSAID use, and ARB use contributing.  Baseline creatinine now known to be 2.  Patient continues to improve but seems to be slowly -Holding culprit meds -Continue with oral hydration -Hard to say if the patient will return to her baseline.  Only time will tell.  We will continue to monitor her creatinine intermittently -Continue to monitor daily Cr, Dose meds for GFR -Monitor Daily I/Os, Daily weight  -Maintain MAP>65 for optimal renal perfusion.  -Avoid nephrotoxic medications including NSAIDs and Vanc/Zosyn combo -Renal ultrasound without major concern -Currently no indication for HD  Chronic wounds: Status post I&D of gluteal wound.  Antibiotics per primary team  Hypotension/sepsis: Resolved.  Antibiotics as above  Uncontrolled Diabetes Mellitus Type 2 with Hyperglycemia: Management per primary  Hypertension: History of this diagnosis.  Blood pressure stable at this time without medications.  Continue to monitor  We will sign off at this time given the patient's slow but continued improvement in kidney function.  Could watch her kidney function intermittently.  I discussed her care with her primary care physician.  Would consider referral to a nephrologist as an outpatient.  Recommendations conveyed to primary service.    [facility] 01/09/2021 12:51 PM  ___________________________________________________________  CC: AKI  Interval History/Subjective: Patient had a rough night last night without getting much sleep.  She did have her dressing changed today and has been having a lot of pain.  Denies any other complaints.  Urine  output has been good.   Medications:  Current Facility-Administered Medications  Medication Dose Route Frequency Provider Last Rate Last Admin   0.9 %  sodium chloride infusion   Intravenous PRN Jacinto Halim, PA-C 10 mL/hr at 01/05/21 0310 250 mL at 01/05/21 0310   0.9 %  sodium chloride infusion   Intravenous PRN Jacinto Halim, PA-C   Stopped at 01/04/21 1107   acetaminophen (TYLENOL) tablet 650 mg  650 mg Oral Q6H PRN Jacinto Halim, PA-C       Or   acetaminophen (TYLENOL) suppository 650 mg  650 mg Rectal Q6H PRN Maczis, Elmer Sow, PA-C       Chlorhexidine Gluconate Cloth 2 % PADS 6 each  6 each Topical Daily Jacinto Halim, PA-C   6 each at 01/09/21 1100   dextrose 50 % solution 0-50 mL  0-50 mL Intravenous PRN Jacinto Halim, PA-C       diphenhydrAMINE (BENADRYL) capsule 25 mg  25 mg Oral Q6H PRN Jacinto Halim, PA-C       Or   diphenhydrAMINE (BENADRYL) injection 25 mg  25 mg Intravenous Q6H PRN Maczis, Elmer Sow, PA-C       enoxaparin (LOVENOX) injection 30 mg  30 mg Subcutaneous Q12H Danford Bad, RPH   30 mg at 01/09/21 0955   fentaNYL (SUBLIMAZE) injection 25 mcg  25 mcg Intravenous Once Trixie Deis R, PA-C       hydrALAZINE (APRESOLINE) injection 10 mg  10 mg Intravenous Q2H PRN Jacinto Halim, PA-C       hyoscyamine (LEVSIN) 0.125 MG/5ML elixir 0.25 mg  0.25 mg Oral Q6H PRN Jacinto Halim, PA-C   0.25 mg at 01/04/21 1610  insulin aspart (novoLOG) injection 0-20 Units  0-20 Units Subcutaneous TID WC Jacinto Halim, PA-C   3 Units at 01/09/21 1228   insulin aspart (novoLOG) injection 0-5 Units  0-5 Units Subcutaneous QHS Jacinto Halim, PA-C   2 Units at 01/06/21 2135   insulin glargine (LANTUS) injection 45 Units  45 Units Subcutaneous Daily Cipriano Bunker, MD   45 Units at 01/09/21 0954   lip balm (CARMEX) ointment   Topical PRN Jacinto Halim, PA-C   Given at 01/03/21 2354   MEDLINE mouth rinse  15 mL Mouth Rinse BID Jacinto Halim,  PA-C   15 mL at 01/09/21 4098   methocarbamol (ROBAXIN) tablet 500 mg  500 mg Oral Q6H PRN Jacinto Halim, PA-C   500 mg at 01/09/21 0813   morphine 2 MG/ML injection 2 mg  2 mg Intravenous Q2H PRN Jacinto Halim, PA-C   2 mg at 01/09/21 0814   ondansetron (ZOFRAN-ODT) disintegrating tablet 4 mg  4 mg Oral Q6H PRN Jacinto Halim, PA-C       Or   ondansetron [facility]) injection 4 mg  4 mg Intravenous Q6H PRN Jacinto Halim, PA-C   4 mg at 01/08/21 1717   [START ON 01/11/2021] oxyCODONE (Oxy IR/ROXICODONE) immediate release tablet 15 mg  15 mg Oral Q M,W,F Juliet Rude, PA-C       oxyCODONE (Oxy IR/ROXICODONE) immediate release tablet 5-10 mg  5-10 mg Oral Q4H PRN Jacinto Halim, PA-C   10 mg at 01/08/21 0926   piperacillin-tazobactam (ZOSYN) IVPB 3.375 g  3.375 g Intravenous Q8H Jacinto Halim, PA-C 12.5 mL/hr at 01/09/21 0818 3.375 g at 01/09/21 0818      Review of Systems: 10 systems reviewed and negative except per interval history/subjective  Physical Exam: Vitals:   01/08/21 2033 01/09/21 0501  BP: 110/83 134/75  Pulse: 96 83  Resp: 20 20  Temp: 98.2 F (36.8 C) 98.7 F (37.1 C)  SpO2: 98% 99%   No intake/output data recorded.  Intake/Output Summary (Last 24 hours) at 01/09/2021 1251 Last data filed at 01/09/2021 0541 Gross per 24 hour  Intake 136.11 ml  Output 1175 ml  Net -1038.89 ml   Constitutional: Obese, lying in bed, no distress ENMT: ears and nose without scars or lesions, MMM CV: normal rate, no edema Respiratory: Bilateral chest rise normal work of breathing Gastrointestinal: soft, non-tender, no palpable masses or hernias Psych: alert, judgement/insight appropriate, appropriate mood and affect   Test Results I personally reviewed new and old clinical labs and radiology tests Lab Results  Component Value Date   NA 143 01/09/2021   K 3.7 01/09/2021   CL 106 01/09/2021   CO2 28 01/09/2021   BUN 34 (H) 01/09/2021   CREATININE 3.34 (H)  01/09/2021   CALCIUM 8.3 (L) 01/09/2021   ALBUMIN 2.1 (L) 01/09/2021   PHOS 3.1 01/09/2021   Recent Results (from the past 2160 hour(s))  Glucose, capillary     Status: Abnormal   Collection Time: 01/03/21  7:50 PM  Result Value Ref Range   Glucose-Capillary >600 (HH) 70 - 99 mg/dL    Comment: Glucose reference range applies only to samples taken after fasting for at least 8 hours.  Comprehensive metabolic panel     Status: Abnormal   Collection Time: 01/03/21  8:21 PM  Result Value Ref Range   Sodium 129 (L) 135 - 145 mmol/L   Potassium 4.3 3.5 - 5.1 mmol/L  Chloride 92 (L) 98 - 111 mmol/L   CO2 24 22 - 32 mmol/L   Glucose, Bld 685 (HH) 70 - 99 mg/dL    Comment: Glucose reference range applies only to samples taken after fasting for at least 8 hours. CRITICAL RESULT CALLED TO, READ BACK BY AND VERIFIED WITH: MILLER, J. RN@2054  01/03/21 KELLY, J.    BUN 74 (H) 6 - 20 mg/dL   Creatinine, Ser 1.01 (H) 0.44 - 1.00 mg/dL   Calcium 8.7 (L) 8.9 - 10.3 mg/dL   Total Protein 8.2 (H) 6.5 - 8.1 g/dL   Albumin 2.7 (L) 3.5 - 5.0 g/dL   AST 11 (L) 15 - 41 U/L   ALT 15 0 - 44 U/L   Alkaline Phosphatase 188 (H) 38 - 126 U/L   Total Bilirubin 0.8 0.3 - 1.2 mg/dL   GFR, Estimated 11 (L) >60 mL/min    Comment: (NOTE) Calculated using the CKD-EPI Creatinine Equation (2021)    Anion gap 13 5 - 15    Comment: Performed at [facility], [address] [place], [place] [address]  CBC WITH DIFFERENTIAL     Status: Abnormal   Collection Time: 01/03/21  8:21 PM  Result Value Ref Range   WBC 15.5 (H) 4.0 - 10.5 K/uL   RBC 3.59 (L) 3.87 - 5.11 MIL/uL   Hemoglobin 11.0 (L) 12.0 - 15.0 g/dL   HCT 58.5 (L) 27.7 - 82.4 %   MCV 95.5 80.0 - 100.0 fL   MCH 30.6 26.0 - 34.0 pg   MCHC 32.1 30.0 - 36.0 g/dL   RDW 23.5 36.1 - 44.3 %   Platelets 376 150 - 400 K/uL   nRBC 0.0 0.0 - 0.2 %   Neutrophils Relative % 83 %   Neutro Abs 13.0 (H) 1.7 - 7.7 K/uL   Lymphocytes Relative 8 %    Lymphs Abs 1.2 0.7 - 4.0 K/uL   Monocytes Relative 8 %   Monocytes Absolute 1.2 (H) 0.1 - 1.0 K/uL   Eosinophils Relative 0 %   Eosinophils Absolute 0.0 0.0 - 0.5 K/uL   Basophils Relative 0 %   Basophils Absolute 0.0 0.0 - 0.1 K/uL   Immature Granulocytes 1 %   Abs Immature Granulocytes 0.07 0.00 - 0.07 K/uL    Comment: Performed at [facility], [address], [place], [place] [address]  Hemoglobin A1c     Status: Abnormal   Collection Time: 01/03/21  8:21 PM  Result Value Ref Range   Hgb A1c MFr Bld 12.8 (H) 4.8 - 5.6 %    Comment: (NOTE)         Prediabetes: 5.7 - 6.4         Diabetes: >6.4         Glycemic control for adults with diabetes: <7.0    Mean Plasma Glucose 321 mg/dL    Comment: (NOTE) Performed At: [facility] [address] [place], [place] [id number] Jolene Schimke MD TO:[id number]   Surgical PCR screen     Status: None   Collection Time: 01/03/21  9:00 PM   Specimen: Nasal Mucosa; Nasal Swab  Result Value Ref Range   MRSA, PCR NEGATIVE NEGATIVE   Staphylococcus aureus NEGATIVE NEGATIVE    Comment: (NOTE) The Xpert SA Assay (FDA approved for NASAL specimens in patients 91 years of age and older), is one component of a comprehensive surveillance program. It is not intended to diagnose infection nor to guide or monitor treatment. Performed  at [facility], [address], [place], [place] [address]   SARS Coronavirus 2 by RT PCR (hospital order, performed in [facility] hospital lab) Nasopharyngeal Nasal Mucosa     Status: None   Collection Time: 01/03/21  9:13 PM   Specimen: Nasal Mucosa; Nasopharyngeal  Result Value Ref Range   SARS Coronavirus 2 NEGATIVE NEGATIVE    Comment: (NOTE) SARS-CoV-2 target nucleic acids are NOT DETECTED.  The SARS-CoV-2 RNA is generally detectable in upper and lower respiratory specimens during the acute phase of infection. The lowest concentration of SARS-CoV-2 viral copies  this assay can detect is 250 copies / mL. A negative result does not preclude SARS-CoV-2 infection and should not be used as the sole basis for treatment or other patient management decisions.  A negative result may occur with improper specimen collection / handling, submission of specimen other than nasopharyngeal swab, presence of viral mutation(s) within the areas targeted by this assay, and inadequate number of viral copies (<250 copies / mL). A negative result must be combined with clinical observations, patient history, and epidemiological information.  Fact Sheet for Patients:   BoilerBrush.com.cy  Fact Sheet for Healthcare Providers: https://pope.com/  This test is not yet approved or  cleared by the [place] FDA and has been authorized for detection and/or diagnosis of SARS-CoV-2 by FDA under an Emergency Use Authorization (EUA).  This EUA will remain in effect (meaning this test can be used) for the duration of the COVID-19 declaration under Section 564(b)(1) of the Act, 21 U.S.C. section 360bbb-3(b)(1), unless the authorization is terminated or revoked sooner.  Performed at [facility], [address] [place], [place] [address]   Glucose, capillary     Status: Abnormal   Collection Time: 01/03/21 11:06 PM  Result Value Ref Range   Glucose-Capillary >600 (HH) 70 - 99 mg/dL    Comment: Glucose reference range applies only to samples taken after fasting for at least 8 hours.  Glucose, capillary     Status: Abnormal   Collection Time: 01/03/21 11:42 PM  Result Value Ref Range   Glucose-Capillary 591 (HH) 70 - 99 mg/dL    Comment: Glucose reference range applies only to samples taken after fasting for at least 8 hours.   Comment 1 Notify RN   Glucose, capillary     Status: Abnormal   Collection Time: 01/04/21 12:21 AM  Result Value Ref Range   Glucose-Capillary 528 (HH) 70 - 99 mg/dL    Comment:  Glucose reference range applies only to samples taken after fasting for at least 8 hours.   Comment 1 Notify RN   Glucose, capillary     Status: Abnormal   Collection Time: 01/04/21 12:56 AM  Result Value Ref Range   Glucose-Capillary 452 (H) 70 - 99 mg/dL    Comment: Glucose reference range applies only to samples taken after fasting for at least 8 hours.  Glucose, capillary     Status: Abnormal   Collection Time: 01/04/21  1:28 AM  Result Value Ref Range   Glucose-Capillary 324 (H) 70 - 99 mg/dL    Comment: Glucose reference range applies only to samples taken after fasting for at least 8 hours.  Basic metabolic panel     Status: Abnormal   Collection Time: 01/04/21  2:28 AM  Result Value Ref Range   Sodium 137 135 - 145 mmol/L    Comment: DELTA CHECK NOTED   Potassium 3.4 (L) 3.5 - 5.1 mmol/L  Comment: DELTA CHECK NOTED   Chloride 97 (L) 98 - 111 mmol/L   CO2 23 22 - 32 mmol/L   Glucose, Bld 303 (H) 70 - 99 mg/dL    Comment: Glucose reference range applies only to samples taken after fasting for at least 8 hours.   BUN 74 (H) 6 - 20 mg/dL   Creatinine, Ser 2.97 (H) 0.44 - 1.00 mg/dL   Calcium 9.0 8.9 - 98.9 mg/dL   GFR, Estimated 11 (L) >60 mL/min    Comment: (NOTE) Calculated using the CKD-EPI Creatinine Equation (2021)    Anion gap 17 (H) 5 - 15    Comment: Performed at [facility], [address], [place], [place] [address]  CBC     Status: Abnormal   Collection Time: 01/04/21  2:28 AM  Result Value Ref Range   WBC 14.3 (H) 4.0 - 10.5 K/uL   RBC 3.75 (L) 3.87 - 5.11 MIL/uL   Hemoglobin 11.4 (L) 12.0 - 15.0 g/dL   HCT 17.4 (L) 08.1 - 44.8 %   MCV 95.7 80.0 - 100.0 fL   MCH 30.4 26.0 - 34.0 pg   MCHC 31.8 30.0 - 36.0 g/dL   RDW 18.5 63.1 - 49.7 %   Platelets 410 (H) 150 - 400 K/uL   nRBC 0.0 0.0 - 0.2 %    Comment: Performed at [facility], [address] [place], [place] [address]  Glucose, capillary     Status: Abnormal    Collection Time: 01/04/21  2:34 AM  Result Value Ref Range   Glucose-Capillary 219 (H) 70 - 99 mg/dL    Comment: Glucose reference range applies only to samples taken after fasting for at least 8 hours.  Glucose, capillary     Status: Abnormal   Collection Time: 01/04/21  3:41 AM  Result Value Ref Range   Glucose-Capillary 248 (H) 70 - 99 mg/dL    Comment: Glucose reference range applies only to samples taken after fasting for at least 8 hours.  Glucose, capillary     Status: Abnormal   Collection Time: 01/04/21  4:45 AM  Result Value Ref Range   Glucose-Capillary 248 (H) 70 - 99 mg/dL    Comment: Glucose reference range applies only to samples taken after fasting for at least 8 hours.  Basic metabolic panel     Status: Abnormal   Collection Time: 01/04/21  5:43 AM  Result Value Ref Range   Sodium 137 135 - 145 mmol/L   Potassium 3.3 (L) 3.5 - 5.1 mmol/L   Chloride 97 (L) 98 - 111 mmol/L   CO2 23 22 - 32 mmol/L   Glucose, Bld 266 (H) 70 - 99 mg/dL    Comment: Glucose reference range applies only to samples taken after fasting for at least 8 hours.   BUN 74 (H) 6 - 20 mg/dL   Creatinine, Ser 8.58 (H) 0.44 - 1.00 mg/dL   Calcium 9.0 8.9 - 85.0 mg/dL   GFR, Estimated 11 (L) >60 mL/min    Comment: (NOTE) Calculated using the CKD-EPI Creatinine Equation (2021)    Anion gap 17 (H) 5 - 15    Comment: Performed at [facility], [address], [place], [place] [address]  Glucose, capillary     Status: Abnormal   Collection Time: 01/04/21  5:50 AM  Result Value Ref Range   Glucose-Capillary 239 (H) 70 - 99 mg/dL    Comment: Glucose reference range applies only to samples  taken after fasting for at least 8 hours.  Glucose, capillary     Status: Abnormal   Collection Time: 01/04/21  6:47 AM  Result Value Ref Range   Glucose-Capillary 211 (H) 70 - 99 mg/dL    Comment: Glucose reference range applies only to samples taken after fasting for at least 8 hours.  Glucose,  capillary     Status: Abnormal   Collection Time: 01/04/21  7:54 AM  Result Value Ref Range   Glucose-Capillary 219 (H) 70 - 99 mg/dL    Comment: Glucose reference range applies only to samples taken after fasting for at least 8 hours.  Glucose, capillary     Status: Abnormal   Collection Time: 01/04/21  8:55 AM  Result Value Ref Range   Glucose-Capillary 150 (H) 70 - 99 mg/dL    Comment: Glucose reference range applies only to samples taken after fasting for at least 8 hours.   Comment 1 Notify RN    Comment 2 Document in Chart   Sodium, urine, random     Status: None   Collection Time: 01/04/21  9:36 AM  Result Value Ref Range   Sodium, Ur 30 mmol/L    Comment: Performed at [facility], [address] [place], [place] [address]  Creatinine, urine, random     Status: None   Collection Time: 01/04/21  9:36 AM  Result Value Ref Range   Creatinine, Urine 91.66 mg/dL    Comment: Performed at [facility], [address], [place], [place] [address]  Urinalysis, Routine w reflex microscopic Urine, Catheterized     Status: Abnormal   Collection Time: 01/04/21  9:36 AM  Result Value Ref Range   Color, Urine YELLOW YELLOW   APPearance CLEAR CLEAR   Specific Gravity, Urine 1.012 1.005 - 1.030   pH 5.0 5.0 - 8.0   Glucose, UA >=500 (A) NEGATIVE mg/dL   Hgb urine dipstick NEGATIVE NEGATIVE   Bilirubin Urine NEGATIVE NEGATIVE   Ketones, ur NEGATIVE NEGATIVE mg/dL   Protein, ur 30 (A) NEGATIVE mg/dL   Nitrite NEGATIVE NEGATIVE   Leukocytes,Ua NEGATIVE NEGATIVE   RBC / HPF 0-5 0 - 5 RBC/hpf   WBC, UA 0-5 0 - 5 WBC/hpf   Bacteria, UA NONE SEEN NONE SEEN   Squamous Epithelial / LPF 0-5 0 - 5    Comment: Performed at [facility], [address] [place], [place] [address]  Glucose, capillary     Status: Abnormal   Collection Time: 01/04/21  9:56 AM  Result Value Ref Range   Glucose-Capillary 149 (H) 70 - 99 mg/dL    Comment:  Glucose reference range applies only to samples taken after fasting for at least 8 hours.  Basic metabolic panel     Status: Abnormal   Collection Time: 01/04/21  9:57 AM  Result Value Ref Range   Sodium 138 135 - 145 mmol/L   Potassium 3.4 (L) 3.5 - 5.1 mmol/L   Chloride 99 98 - 111 mmol/L   CO2 27 22 - 32 mmol/L   Glucose, Bld 162 (H) 70 - 99 mg/dL    Comment: Glucose reference range applies only to samples taken after fasting for at least 8 hours.   BUN 76 (H) 6 - 20 mg/dL   Creatinine, Ser 9.14 (H) 0.44 - 1.00 mg/dL   Calcium 9.0 8.9 - 78.2 mg/dL   GFR, Estimated 10 (L) >60 mL/min    Comment: (NOTE) Calculated using the CKD-EPI  Creatinine Equation (2021)    Anion gap 12 5 - 15    Comment: Performed at [facility], [address], [place], [place] [address]  Glucose, capillary     Status: Abnormal   Collection Time: 01/04/21 10:55 AM  Result Value Ref Range   Glucose-Capillary 158 (H) 70 - 99 mg/dL    Comment: Glucose reference range applies only to samples taken after fasting for at least 8 hours.   Comment 1 Notify RN    Comment 2 Document in Chart   Glucose, capillary     Status: Abnormal   Collection Time: 01/04/21  4:09 PM  Result Value Ref Range   Glucose-Capillary 191 (H) 70 - 99 mg/dL    Comment: Glucose reference range applies only to samples taken after fasting for at least 8 hours.  Glucose, capillary     Status: Abnormal   Collection Time: 01/04/21  5:20 PM  Result Value Ref Range   Glucose-Capillary 189 (H) 70 - 99 mg/dL    Comment: Glucose reference range applies only to samples taken after fasting for at least 8 hours.   Comment 1 Notify RN    Comment 2 Document in Chart   Glucose, capillary     Status: Abnormal   Collection Time: 01/04/21  9:23 PM  Result Value Ref Range   Glucose-Capillary 378 (H) 70 - 99 mg/dL    Comment: Glucose reference range applies only to samples taken after fasting for at least 8 hours.  CBC with  Differential/Platelet     Status: Abnormal   Collection Time: 01/05/21  5:43 AM  Result Value Ref Range   WBC 17.2 (H) 4.0 - 10.5 K/uL   RBC 3.17 (L) 3.87 - 5.11 MIL/uL   Hemoglobin 9.7 (L) 12.0 - 15.0 g/dL   HCT 68.1 (L) 27.5 - 17.0 %   MCV 97.2 80.0 - 100.0 fL   MCH 30.6 26.0 - 34.0 pg   MCHC 31.5 30.0 - 36.0 g/dL   RDW 01.7 49.4 - 49.6 %   Platelets 364 150 - 400 K/uL   nRBC 0.0 0.0 - 0.2 %   Neutrophils Relative % 83 %   Neutro Abs 14.3 (H) 1.7 - 7.7 K/uL   Lymphocytes Relative 9 %   Lymphs Abs 1.5 0.7 - 4.0 K/uL   Monocytes Relative 7 %   Monocytes Absolute 1.3 (H) 0.1 - 1.0 K/uL   Eosinophils Relative 0 %   Eosinophils Absolute 0.0 0.0 - 0.5 K/uL   Basophils Relative 0 %   Basophils Absolute 0.0 0.0 - 0.1 K/uL   Immature Granulocytes 1 %   Abs Immature Granulocytes 0.13 (H) 0.00 - 0.07 K/uL    Comment: Performed at [facility], [address], [place], [place] [address]  Basic metabolic panel     Status: Abnormal   Collection Time: 01/05/21  5:43 AM  Result Value Ref Range   Sodium 131 (L) 135 - 145 mmol/L    Comment: DELTA CHECK NOTED   Potassium 3.8 3.5 - 5.1 mmol/L   Chloride 95 (L) 98 - 111 mmol/L   CO2 24 22 - 32 mmol/L   Glucose, Bld 410 (H) 70 - 99 mg/dL    Comment: Glucose reference range applies only to samples taken after fasting for at least 8 hours.   BUN 79 (H) 6 - 20 mg/dL   Creatinine, Ser 3.84 (H) 0.44 - 1.00 mg/dL   Calcium 8.2 (L) 8.9 - 10.3 mg/dL  GFR, Estimated 10 (L) >60 mL/min    Comment: (NOTE) Calculated using the CKD-EPI Creatinine Equation (2021)    Anion gap 12 5 - 15    Comment: Performed at [facility], [address] [place], [place] [address]  Glucose, capillary     Status: Abnormal   Collection Time: 01/05/21  7:50 AM  Result Value Ref Range   Glucose-Capillary 329 (H) 70 - 99 mg/dL    Comment: Glucose reference range applies only to samples taken after fasting for at least 8 hours.   Glucose, capillary     Status: Abnormal   Collection Time: 01/05/21  8:13 AM  Result Value Ref Range   Glucose-Capillary 364 (H) 70 - 99 mg/dL    Comment: Glucose reference range applies only to samples taken after fasting for at least 8 hours.  Glucose, capillary     Status: Abnormal   Collection Time: 01/05/21 11:57 AM  Result Value Ref Range   Glucose-Capillary 266 (H) 70 - 99 mg/dL    Comment: Glucose reference range applies only to samples taken after fasting for at least 8 hours.   Comment 1 Document in Chart   Glucose, capillary     Status: Abnormal   Collection Time: 01/05/21  4:33 PM  Result Value Ref Range   Glucose-Capillary 101 (H) 70 - 99 mg/dL    Comment: Glucose reference range applies only to samples taken after fasting for at least 8 hours.  Glucose, capillary     Status: None   Collection Time: 01/05/21  9:22 PM  Result Value Ref Range   Glucose-Capillary 81 70 - 99 mg/dL    Comment: Glucose reference range applies only to samples taken after fasting for at least 8 hours.  CBC     Status: Abnormal   Collection Time: 01/06/21  5:29 AM  Result Value Ref Range   WBC 14.8 (H) 4.0 - 10.5 K/uL   RBC 3.20 (L) 3.87 - 5.11 MIL/uL   Hemoglobin 9.8 (L) 12.0 - 15.0 g/dL   HCT 86.5 (L) 78.4 - 69.6 %   MCV 99.1 80.0 - 100.0 fL   MCH 30.6 26.0 - 34.0 pg   MCHC 30.9 30.0 - 36.0 g/dL   RDW 29.5 28.4 - 13.2 %   Platelets 376 150 - 400 K/uL   nRBC 0.0 0.0 - 0.2 %    Comment: Performed at [facility], [address] [place], [place] [address]  Basic metabolic panel     Status: Abnormal   Collection Time: 01/06/21  5:29 AM  Result Value Ref Range   Sodium 139 135 - 145 mmol/L    Comment: DELTA CHECK NOTED   Potassium 4.0 3.5 - 5.1 mmol/L   Chloride 102 98 - 111 mmol/L   CO2 25 22 - 32 mmol/L   Glucose, Bld 136 (H) 70 - 99 mg/dL    Comment: Glucose reference range applies only to samples taken after fasting for at least 8 hours.   BUN 72 (H) 6 - 20  mg/dL   Creatinine, Ser 2.72 (H) 0.44 - 1.00 mg/dL   Calcium 8.5 (L) 8.9 - 10.3 mg/dL   GFR, Estimated 10 (L) >60 mL/min    Comment: (NOTE) Calculated using the CKD-EPI Creatinine Equation (2021)    Anion gap 12 5 - 15    Comment: Performed at [facility], [address], [place], [place] [address]  Magnesium     Status: None   Collection  Time: 01/06/21  5:29 AM  Result Value Ref Range   Magnesium 2.1 1.7 - 2.4 mg/dL    Comment: Performed at [facility], [address] [place], [place] [address]  Phosphorus     Status: None   Collection Time: 01/06/21  5:29 AM  Result Value Ref Range   Phosphorus 4.4 2.5 - 4.6 mg/dL    Comment: Performed at [facility], [address] [place], [place] [address]  Glucose, capillary     Status: Abnormal   Collection Time: 01/06/21  7:59 AM  Result Value Ref Range   Glucose-Capillary 155 (H) 70 - 99 mg/dL    Comment: Glucose reference range applies only to samples taken after fasting for at least 8 hours.  Glucose, capillary     Status: Abnormal   Collection Time: 01/06/21 11:57 AM  Result Value Ref Range   Glucose-Capillary 212 (H) 70 - 99 mg/dL    Comment: Glucose reference range applies only to samples taken after fasting for at least 8 hours.  Glucose, capillary     Status: Abnormal   Collection Time: 01/06/21  5:25 PM  Result Value Ref Range   Glucose-Capillary 189 (H) 70 - 99 mg/dL    Comment: Glucose reference range applies only to samples taken after fasting for at least 8 hours.  Glucose, capillary     Status: Abnormal   Collection Time: 01/06/21  9:09 PM  Result Value Ref Range   Glucose-Capillary 222 (H) 70 - 99 mg/dL    Comment: Glucose reference range applies only to samples taken after fasting for at least 8 hours.  Renal function panel     Status: Abnormal   Collection Time: 01/07/21  6:45 AM  Result Value Ref Range   Sodium 140 135 - 145 mmol/L   Potassium 3.8 3.5 -  5.1 mmol/L   Chloride 103 98 - 111 mmol/L   CO2 27 22 - 32 mmol/L   Glucose, Bld 229 (H) 70 - 99 mg/dL    Comment: Glucose reference range applies only to samples taken after fasting for at least 8 hours.   BUN 54 (H) 6 - 20 mg/dL   Creatinine, Ser 0.98 (H) 0.44 - 1.00 mg/dL   Calcium 8.2 (L) 8.9 - 10.3 mg/dL   Phosphorus 3.8 2.5 - 4.6 mg/dL   Albumin 2.2 (L) 3.5 - 5.0 g/dL   GFR, Estimated 13 (L) >60 mL/min    Comment: (NOTE) Calculated using the CKD-EPI Creatinine Equation (2021)    Anion gap 10 5 - 15    Comment: Performed at [facility], [address], [place], [place] [address]  Glucose, capillary     Status: Abnormal   Collection Time: 01/07/21  7:15 AM  Result Value Ref Range   Glucose-Capillary 193 (H) 70 - 99 mg/dL    Comment: Glucose reference range applies only to samples taken after fasting for at least 8 hours.  Glucose, capillary     Status: Abnormal   Collection Time: 01/07/21 11:21 AM  Result Value Ref Range   Glucose-Capillary 232 (H) 70 - 99 mg/dL    Comment: Glucose reference range applies only to samples taken after fasting for at least 8 hours.  Glucose, capillary     Status: Abnormal   Collection Time: 01/07/21  4:32 PM  Result Value Ref Range   Glucose-Capillary 262 (H) 70 - 99 mg/dL    Comment: Glucose reference range applies only to samples taken after fasting  for at least 8 hours.  Glucose, capillary     Status: Abnormal   Collection Time: 01/07/21 10:09 PM  Result Value Ref Range   Glucose-Capillary 160 (H) 70 - 99 mg/dL    Comment: Glucose reference range applies only to samples taken after fasting for at least 8 hours.  Glucose, capillary     Status: Abnormal   Collection Time: 01/07/21 10:47 PM  Result Value Ref Range   Glucose-Capillary 160 (H) 70 - 99 mg/dL    Comment: Glucose reference range applies only to samples taken after fasting for at least 8 hours.  Renal function panel     Status: Abnormal   Collection Time:  01/08/21  4:23 AM  Result Value Ref Range   Sodium 143 135 - 145 mmol/L   Potassium 3.7 3.5 - 5.1 mmol/L   Chloride 107 98 - 111 mmol/L   CO2 27 22 - 32 mmol/L   Glucose, Bld 109 (H) 70 - 99 mg/dL    Comment: Glucose reference range applies only to samples taken after fasting for at least 8 hours.   BUN 41 (H) 6 - 20 mg/dL   Creatinine, Ser 1.61 (H) 0.44 - 1.00 mg/dL   Calcium 8.4 (L) 8.9 - 10.3 mg/dL   Phosphorus 3.5 2.5 - 4.6 mg/dL   Albumin 2.1 (L) 3.5 - 5.0 g/dL   GFR, Estimated 15 (L) >60 mL/min    Comment: (NOTE) Calculated using the CKD-EPI Creatinine Equation (2021)    Anion gap 9 5 - 15    Comment: Performed at [facility], [address], [place], [place] [address]  Glucose, capillary     Status: Abnormal   Collection Time: 01/08/21  7:58 AM  Result Value Ref Range   Glucose-Capillary 118 (H) 70 - 99 mg/dL    Comment: Glucose reference range applies only to samples taken after fasting for at least 8 hours.   Comment 1 Notify RN    Comment 2 Document in Chart   Glucose, capillary     Status: Abnormal   Collection Time: 01/08/21 11:31 AM  Result Value Ref Range   Glucose-Capillary 157 (H) 70 - 99 mg/dL    Comment: Glucose reference range applies only to samples taken after fasting for at least 8 hours.   Comment 1 Notify RN    Comment 2 Document in Chart   Glucose, capillary     Status: Abnormal   Collection Time: 01/08/21  4:39 PM  Result Value Ref Range   Glucose-Capillary 158 (H) 70 - 99 mg/dL    Comment: Glucose reference range applies only to samples taken after fasting for at least 8 hours.  Glucose, capillary     Status: Abnormal   Collection Time: 01/08/21  8:57 PM  Result Value Ref Range   Glucose-Capillary 125 (H) 70 - 99 mg/dL    Comment: Glucose reference range applies only to samples taken after fasting for at least 8 hours.  Renal function panel     Status: Abnormal   Collection Time: 01/09/21  4:25 AM  Result Value Ref Range    Sodium 143 135 - 145 mmol/L   Potassium 3.7 3.5 - 5.1 mmol/L   Chloride 106 98 - 111 mmol/L   CO2 28 22 - 32 mmol/L   Glucose, Bld 142 (H) 70 - 99 mg/dL    Comment: Glucose reference range applies only to samples taken after fasting for at least 8 hours.   BUN 34 (H) 6 -  20 mg/dL   Creatinine, Ser 1.613.34 (H) 0.44 - 1.00 mg/dL   Calcium 8.3 (L) 8.9 - 10.3 mg/dL   Phosphorus 3.1 2.5 - 4.6 mg/dL   Albumin 2.1 (L) 3.5 - 5.0 g/dL   GFR, Estimated 15 (L) >60 mL/min    Comment: (NOTE) Calculated using the CKD-EPI Creatinine Equation (2021)    Anion gap 9 5 - 15    Comment: Performed at [facility], [address] [place], [place] [address]  CBC     Status: Abnormal   Collection Time: 01/09/21  4:25 AM  Result Value Ref Range   WBC 13.3 (H) 4.0 - 10.5 K/uL   RBC 2.63 (L) 3.87 - 5.11 MIL/uL   Hemoglobin 8.1 (L) 12.0 - 15.0 g/dL   HCT 54.026.7 (L) 98.136.0 - 19.146.0 %   MCV 101.5 (H) 80.0 - 100.0 fL   MCH 30.8 26.0 - 34.0 pg   MCHC 30.3 30.0 - 36.0 g/dL   RDW 47.813.5 29.511.5 - 62.115.5 %   Platelets 402 (H) 150 - 400 K/uL   nRBC 0.0 0.0 - 0.2 %    Comment: Performed at [facility], [address] [place], [place] [address]  Glucose, capillary     Status: Abnormal   Collection Time: 01/09/21  7:52 AM  Result Value Ref Range   Glucose-Capillary 117 (H) 70 - 99 mg/dL    Comment: Glucose reference range applies only to samples taken after fasting for at least 8 hours.   Comment 1 Notify RN    Comment 2 Document in Chart   Glucose, capillary     Status: Abnormal   Collection Time: 01/09/21 12:16 PM  Result Value Ref Range   Glucose-Capillary 149 (H) 70 - 99 mg/dL    Comment: Glucose reference range applies only to samples taken after fasting for at least 8 hours.   Comment 1 Notify RN    Comment 2 Document in Chart

## 2021-01-09 NOTE — Progress Notes (Signed)
PROGRESS NOTE    Erin Davila  WEX:[id number] DOB: Dec 01, 1960 DOA: 01/03/2021 PCP: [name], MD    Brief Narrative:  This 60 year old female with history of insulin-dependent diabetes type 2, hypertension, morbid obesity who presented as direct admission from general surgery outpatient office for evaluation of necrotizing wound on the left buttock.  We were consulted for the management of hyperglycemia /AKI.  She recently had incision and drainage for her left gluteal abscess and was taking antibiotics at home. On presentation to the office at general surgery, wound was found to be necrotic with persistent drainage and therefore she was sent for direct admission.  On presentation she was severely hyperglycemic, lab work showed severe AKI with creatinine in the range of 4, no history of CKD.  Assessment & Plan:   Principal Problem:   Diabetes mellitus type 2 in obese [facility]) Active Problems:   HTN (hypertension)   Morbid obesity (HCC)   Cellulitis and abscess of buttock   Hyperglycemia due to diabetes mellitus (HCC)   Left buttock abscess  Diabetes type 2/ Hyperglycemia:  She takes 45 units Levemir insulin daily at home . She presented with severe hyperglycemia.   She was started on insulin drip initially, changed to Lantus and sliding scale.  Monitor blood sugars.   Hb A1c 12.5.  Continue Levemir to 45 units daily, Continue sliding scale to resistant. Diabetic consult appreciated.  Blood glucose improved.   AKI:  Creatinine in the range of 4 on presentation.  She takes olmesartan at home.  No history of CKD.   AKI is most likely prerenal secondary to polyuria causing volume depletion.   Renal Ultrasound did not show any obstruction.  continued on IV fluids, slight improvement in creatinine. Discontinue IV fluids, encourage oral intake,   Avoid nephrotoxins. Continue foley catheter. Nephro: No indication for hemodialysis.  Patient is making urine. Creatinine is   improving. Nephrology signed off,  continue to monitor serum creatinine.   Severe sepsis/septic shock:  She presented with severe leukocytosis, hypotension.   Continue IV antibiotics.  Blood pressure is better now.   Necrotizing left gluteal wound:  Management as per general surgery.  Continue antibiotics, follow cultures.   Hypertension: She takes olmesartan.  Currently on hold due to AKI.  Monitor blood pressure.     Morbid obesity: BMI of 57. Diet counseling completed.   Hypokalemia: Replaced . Continue to monitor.    Subjective: Patient was seen and examined at bedside,  overnight events noted.  She reports feeling better. Patient patient reports significant amount of pain with wound VAC change.. Wound care is on the board.  Objective: Vitals:   01/08/21 2033 01/09/21 0500 01/09/21 0501 01/09/21 1513  BP: 110/83  134/75 (!) 145/46  Pulse: 96  83 87  Resp: 20  20 18   Temp: 98.2 F (36.8 C)  98.7 F (37.1 C) 98.2 F (36.8 C)  TempSrc: Oral  Oral Oral  SpO2: 98%  99% 100%  Weight:  (!) 179.4 kg    Height:        Intake/Output Summary (Last 24 hours) at 01/09/2021 1626 Last data filed at 01/09/2021 1555 Gross per 24 hour  Intake 154.27 ml  Output 1125 ml  Net -970.73 ml   Filed Weights   01/07/21 0352 01/08/21 0500 01/09/21 0500  Weight: (!) 178.9 kg (!) 175.9 kg (!) 179.4 kg    Examination:  General exam: Appears calm and comfortable, not in any acute distress. Respiratory system: Clear to auscultation. Respiratory  effort normal. Cardiovascular system: S1 & S2 heard, RRR. No JVD, murmurs, rubs, gallops or clicks. No pedal edema. Gastrointestinal system: Abdomen is nondistended, soft and nontender. No organomegaly or masses felt.  Normal bowel sounds heard. Central nervous system: Alert and oriented. No focal neurological deficits. Extremities: Symmetric 5 x 5 power. Skin: There is foul-smelling wound noted on left buttock. Dressing noted. Psychiatry: Judgement  and insight appear normal. Mood & affect appropriate.     Data Reviewed: I have personally reviewed following labs and imaging studies  CBC: Recent Labs  Lab 01/03/21 2021 01/04/21 0228 01/05/21 0543 01/06/21 0529 01/09/21 0425  WBC 15.5* 14.3* 17.2* 14.8* 13.3*  NEUTROABS 13.0*  --  14.3*  --   --   HGB 11.0* 11.4* 9.7* 9.8* 8.1*  HCT 34.3* 35.9* 30.8* 31.7* 26.7*  MCV 95.5 95.7 97.2 99.1 101.5*  PLT 376 410* 364 376 402*   Basic Metabolic Panel: Recent Labs  Lab 01/05/21 0543 01/06/21 0529 01/07/21 0645 01/08/21 0423 01/09/21 0425  NA 131* 139 140 143 143  K 3.8 4.0 3.8 3.7 3.7  CL 95* 102 103 107 106  CO2 24 25 27 27 28   GLUCOSE 410* 136* 229* 109* 142*  BUN 79* 72* 54* 41* 34*  CREATININE 4.91* 4.72* 3.91* 3.38* 3.34*  CALCIUM 8.2* 8.5* 8.2* 8.4* 8.3*  MG  --  2.1  --   --   --   PHOS  --  4.4 3.8 3.5 3.1   GFR: Estimated Creatinine Clearance: 31.9 mL/min (A) (by C-G formula based on SCr of 3.34 mg/dL (H)). Liver Function Tests: Recent Labs  Lab 01/03/21 2021 01/07/21 0645 01/08/21 0423 01/09/21 0425  AST 11*  --   --   --   ALT 15  --   --   --   ALKPHOS 188*  --   --   --   BILITOT 0.8  --   --   --   PROT 8.2*  --   --   --   ALBUMIN 2.7* 2.2* 2.1* 2.1*   No results for input(s): LIPASE, AMYLASE in the last 168 hours. No results for input(s): AMMONIA in the last 168 hours. Coagulation Profile: No results for input(s): INR, PROTIME in the last 168 hours. Cardiac Enzymes: No results for input(s): CKTOTAL, CKMB, CKMBINDEX, TROPONINI in the last 168 hours. BNP (last 3 results) No results for input(s): PROBNP in the last 8760 hours. HbA1C: No results for input(s): HGBA1C in the last 72 hours.  CBG: Recent Labs  Lab 01/08/21 1639 01/08/21 2057 01/09/21 0752 01/09/21 1216 01/09/21 1618  GLUCAP 158* 125* 117* 149* 184*   Lipid Profile: No results for input(s): CHOL, HDL, LDLCALC, TRIG, CHOLHDL, LDLDIRECT in the last 72 hours. Thyroid  Function Tests: No results for input(s): TSH, T4TOTAL, FREET4, T3FREE, THYROIDAB in the last 72 hours. Anemia Panel: No results for input(s): VITAMINB12, FOLATE, FERRITIN, TIBC, IRON, RETICCTPCT in the last 72 hours. Sepsis Labs: No results for input(s): PROCALCITON, LATICACIDVEN in the last 168 hours.  Recent Results (from the past 240 hour(s))  Surgical PCR screen     Status: None   Collection Time: 01/03/21  9:00 PM   Specimen: Nasal Mucosa; Nasal Swab  Result Value Ref Range Status   MRSA, PCR NEGATIVE NEGATIVE Final   Staphylococcus aureus NEGATIVE NEGATIVE Final    Comment: (NOTE) The Xpert SA Assay (FDA approved for NASAL specimens in patients 101 years of age and older), is one component of a comprehensive  surveillance program. It is not intended to diagnose infection nor to guide or monitor treatment. Performed at [facility], [address], [place], [place] [address]   SARS Coronavirus 2 by RT PCR (hospital order, performed in [facility] hospital lab) Nasopharyngeal Nasal Mucosa     Status: None   Collection Time: 01/03/21  9:13 PM   Specimen: Nasal Mucosa; Nasopharyngeal  Result Value Ref Range Status   SARS Coronavirus 2 NEGATIVE NEGATIVE Final    Comment: (NOTE) SARS-CoV-2 target nucleic acids are NOT DETECTED.  The SARS-CoV-2 RNA is generally detectable in upper and lower respiratory specimens during the acute phase of infection. The lowest concentration of SARS-CoV-2 viral copies this assay can detect is 250 copies / mL. A negative result does not preclude SARS-CoV-2 infection and should not be used as the sole basis for treatment or other patient management decisions.  A negative result may occur with improper specimen collection / handling, submission of specimen other than nasopharyngeal swab, presence of viral mutation(s) within the areas targeted by this assay, and inadequate number of viral copies (<250 copies / mL). A negative result  must be combined with clinical observations, patient history, and epidemiological information.  Fact Sheet for Patients:   BoilerBrush.com.cy  Fact Sheet for Healthcare Providers: https://pope.com/  This test is not yet approved or  cleared by the [place] FDA and has been authorized for detection and/or diagnosis of SARS-CoV-2 by FDA under an Emergency Use Authorization (EUA).  This EUA will remain in effect (meaning this test can be used) for the duration of the COVID-19 declaration under Section 564(b)(1) of the Act, 21 U.S.C. section 360bbb-3(b)(1), unless the authorization is terminated or revoked sooner.  Performed at [facility], [address], [place], [place] [address]       Radiology Studies: No results found.  Scheduled Meds:  Chlorhexidine Gluconate Cloth  6 each Topical Daily   enoxaparin (LOVENOX) injection  30 mg Subcutaneous Q12H   fentaNYL (SUBLIMAZE) injection  25 mcg Intravenous Once   insulin aspart  0-20 Units Subcutaneous TID WC   insulin aspart  0-5 Units Subcutaneous QHS   insulin glargine  45 Units Subcutaneous Daily   mouth rinse  15 mL Mouth Rinse BID   [START ON 01/11/2021] oxyCODONE  15 mg Oral Q M,W,F   Continuous Infusions:  sodium chloride 250 mL (01/05/21 0310)   sodium chloride Stopped (01/04/21 1107)   piperacillin-tazobactam (ZOSYN)  IV 12.5 mL/hr at 01/09/21 1555     LOS: 6 days    Time spent: 25 mins    Erin Leverich, MD [facility]   If 7PM-7AM, please contact night-coverage

## 2021-01-10 ENCOUNTER — Inpatient Hospital Stay (HOSPITAL_COMMUNITY)
Admission: RE | Admit: 2021-01-10 | Discharge: 2021-01-21 | DRG: 945 | Disposition: A | Discharge status: Home / Self Care | Payer: 59 | Source: Ambulatory Visit | Attending: Physical Medicine and Rehabilitation | Admitting: Physical Medicine and Rehabilitation

## 2021-01-10 ENCOUNTER — Other Ambulatory Visit: Payer: Self-pay

## 2021-01-10 ENCOUNTER — Encounter (HOSPITAL_COMMUNITY): Payer: Self-pay | Admitting: Physical Medicine and Rehabilitation

## 2021-01-10 DIAGNOSIS — Z713 Dietary counseling and surveillance: Secondary | ICD-10-CM

## 2021-01-10 DIAGNOSIS — E1165 Type 2 diabetes mellitus with hyperglycemia: Secondary | ICD-10-CM | POA: Diagnosis present

## 2021-01-10 DIAGNOSIS — G8918 Other acute postprocedural pain: Secondary | ICD-10-CM | POA: Diagnosis present

## 2021-01-10 DIAGNOSIS — I959 Hypotension, unspecified: Secondary | ICD-10-CM | POA: Diagnosis not present

## 2021-01-10 DIAGNOSIS — R350 Frequency of micturition: Secondary | ICD-10-CM | POA: Diagnosis present

## 2021-01-10 DIAGNOSIS — D75839 Thrombocytosis, unspecified: Secondary | ICD-10-CM | POA: Diagnosis present

## 2021-01-10 DIAGNOSIS — R5381 Other malaise: Secondary | ICD-10-CM

## 2021-01-10 DIAGNOSIS — E669 Obesity, unspecified: Secondary | ICD-10-CM

## 2021-01-10 DIAGNOSIS — N182 Chronic kidney disease, stage 2 (mild): Secondary | ICD-10-CM | POA: Diagnosis present

## 2021-01-10 DIAGNOSIS — E785 Hyperlipidemia, unspecified: Secondary | ICD-10-CM | POA: Diagnosis present

## 2021-01-10 DIAGNOSIS — E1169 Type 2 diabetes mellitus with other specified complication: Secondary | ICD-10-CM

## 2021-01-10 DIAGNOSIS — Z7984 Long term (current) use of oral hypoglycemic drugs: Secondary | ICD-10-CM

## 2021-01-10 DIAGNOSIS — I129 Hypertensive chronic kidney disease with stage 1 through stage 4 chronic kidney disease, or unspecified chronic kidney disease: Secondary | ICD-10-CM | POA: Diagnosis present

## 2021-01-10 DIAGNOSIS — D631 Anemia in chronic kidney disease: Secondary | ICD-10-CM | POA: Diagnosis present

## 2021-01-10 DIAGNOSIS — L0231 Cutaneous abscess of buttock: Secondary | ICD-10-CM | POA: Diagnosis present

## 2021-01-10 DIAGNOSIS — N390 Urinary tract infection, site not specified: Secondary | ICD-10-CM | POA: Diagnosis not present

## 2021-01-10 DIAGNOSIS — E559 Vitamin D deficiency, unspecified: Secondary | ICD-10-CM | POA: Diagnosis present

## 2021-01-10 DIAGNOSIS — N179 Acute kidney failure, unspecified: Secondary | ICD-10-CM | POA: Diagnosis present

## 2021-01-10 DIAGNOSIS — Z794 Long term (current) use of insulin: Secondary | ICD-10-CM

## 2021-01-10 DIAGNOSIS — E1122 Type 2 diabetes mellitus with diabetic chronic kidney disease: Secondary | ICD-10-CM | POA: Diagnosis present

## 2021-01-10 DIAGNOSIS — A419 Sepsis, unspecified organism: Secondary | ICD-10-CM | POA: Diagnosis present

## 2021-01-10 DIAGNOSIS — I10 Essential (primary) hypertension: Secondary | ICD-10-CM | POA: Diagnosis not present

## 2021-01-10 DIAGNOSIS — Z91011 Allergy to milk products: Secondary | ICD-10-CM | POA: Diagnosis not present

## 2021-01-10 DIAGNOSIS — R269 Unspecified abnormalities of gait and mobility: Secondary | ICD-10-CM | POA: Diagnosis present

## 2021-01-10 DIAGNOSIS — E612 Magnesium deficiency: Secondary | ICD-10-CM | POA: Diagnosis present

## 2021-01-10 DIAGNOSIS — D539 Nutritional anemia, unspecified: Secondary | ICD-10-CM | POA: Diagnosis present

## 2021-01-10 DIAGNOSIS — K5900 Constipation, unspecified: Secondary | ICD-10-CM | POA: Diagnosis present

## 2021-01-10 DIAGNOSIS — E876 Hypokalemia: Secondary | ICD-10-CM | POA: Diagnosis present

## 2021-01-10 DIAGNOSIS — D638 Anemia in other chronic diseases classified elsewhere: Secondary | ICD-10-CM | POA: Diagnosis not present

## 2021-01-10 DIAGNOSIS — Z79899 Other long term (current) drug therapy: Secondary | ICD-10-CM

## 2021-01-10 DIAGNOSIS — Z6841 Body Mass Index (BMI) 40.0 and over, adult: Secondary | ICD-10-CM

## 2021-01-10 DIAGNOSIS — Z79891 Long term (current) use of opiate analgesic: Secondary | ICD-10-CM

## 2021-01-10 LAB — GLUCOSE, CAPILLARY
Glucose-Capillary: 141 mg/dL — ABNORMAL HIGH (ref 70–99)
Glucose-Capillary: 151 mg/dL — ABNORMAL HIGH (ref 70–99)
Glucose-Capillary: 156 mg/dL — ABNORMAL HIGH (ref 70–99)
Glucose-Capillary: 144 mg/dL — ABNORMAL HIGH (ref 70–99)

## 2021-01-10 LAB — RENAL FUNCTION PANEL
Albumin: 2.4 g/dL — ABNORMAL LOW (ref 3.5–5.0)
Anion gap: 9 (ref 5–15)
BUN: 29 mg/dL — ABNORMAL HIGH (ref 6–20)
CO2: 26 mmol/L (ref 22–32)
Calcium: 8.4 mg/dL — ABNORMAL LOW (ref 8.9–10.3)
Chloride: 105 mmol/L (ref 98–111)
Creatinine, Ser: 3.17 mg/dL — ABNORMAL HIGH (ref 0.44–1.00)
GFR, Estimated: 16 mL/min — ABNORMAL LOW (ref >60)
Glucose, Bld: 136 mg/dL — ABNORMAL HIGH (ref 70–99)
Phosphorus: 3.5 mg/dL (ref 2.5–4.6)
Potassium: 3.6 mmol/L (ref 3.5–5.1)
Sodium: 140 mmol/L (ref 135–145)

## 2021-01-10 MED ORDER — EXERCISE FOR HEART AND HEALTH BOOK
Freq: Once | Status: AC
  Filled 2021-01-10: qty 1

## 2021-01-10 MED ORDER — INSULIN GLARGINE 100 UNIT/ML ~~LOC~~ SOLN
45.0000 [IU] | Freq: Every day | SUBCUTANEOUS | Status: DC
  Administered 2021-01-11 – 2021-01-12 (×2): 45 [IU] via SUBCUTANEOUS
  Filled 2021-01-10 (×3): qty 0.45

## 2021-01-10 MED ORDER — METHOCARBAMOL 500 MG PO TABS
500.0000 mg | ORAL_TABLET | Freq: Four times a day (QID) | ORAL | Status: DC | PRN
  Administered 2021-01-14 – 2021-01-18 (×6): 500 mg via ORAL
  Filled 2021-01-10 (×6): qty 1

## 2021-01-10 MED ORDER — OXYCODONE HCL 5 MG PO TABS
5.0000 mg | ORAL_TABLET | ORAL | Status: DC | PRN
  Administered 2021-01-10 – 2021-01-11 (×3): 10 mg via ORAL
  Administered 2021-01-11: 5 mg via ORAL
  Administered 2021-01-11 – 2021-01-20 (×13): 10 mg via ORAL
  Filled 2021-01-10 (×17): qty 2

## 2021-01-10 MED ORDER — HYOSCYAMINE SULFATE 0.125 MG/5ML PO ELIX
0.2500 mg | ORAL_SOLUTION | Freq: Four times a day (QID) | ORAL | Status: DC | PRN
  Filled 2021-01-10: qty 10

## 2021-01-10 MED ORDER — ONDANSETRON 4 MG PO TBDP
4.0000 mg | ORAL_TABLET | Freq: Four times a day (QID) | ORAL | Status: DC | PRN
  Administered 2021-01-14: 4 mg via ORAL
  Filled 2021-01-10 (×3): qty 1

## 2021-01-10 MED ORDER — LIP MEDEX EX OINT
TOPICAL_OINTMENT | CUTANEOUS | Status: DC | PRN
  Filled 2021-01-10: qty 7

## 2021-01-10 MED ORDER — ENOXAPARIN SODIUM 30 MG/0.3ML IJ SOSY
30.0000 mg | PREFILLED_SYRINGE | Freq: Two times a day (BID) | INTRAMUSCULAR | Status: DC
  Administered 2021-01-10 – 2021-01-18 (×16): 30 mg via SUBCUTANEOUS
  Filled 2021-01-10 (×16): qty 0.3

## 2021-01-10 MED ORDER — ACETAMINOPHEN 650 MG RE SUPP: 650.0000 mg | Freq: Four times a day (QID) | RECTAL | Status: DC | PRN

## 2021-01-10 MED ORDER — ONDANSETRON HCL 4 MG/2ML IJ SOLN
4.0000 mg | Freq: Four times a day (QID) | INTRAMUSCULAR | Status: DC | PRN
  Administered 2021-01-15 (×2): 4 mg via INTRAVENOUS
  Filled 2021-01-10 (×2): qty 2

## 2021-01-10 MED ORDER — OXYCODONE HCL 5 MG PO TABS
15.0000 mg | ORAL_TABLET | ORAL | Status: DC
  Administered 2021-01-14: 10 mg via ORAL
  Administered 2021-01-16 – 2021-01-21 (×3): 15 mg via ORAL
  Filled 2021-01-10 (×4): qty 3

## 2021-01-10 MED ORDER — AMOXICILLIN-POT CLAVULANATE 875-125 MG PO TABS: 1.0000 | ORAL_TABLET | Freq: Two times a day (BID) | ORAL | Status: DC

## 2021-01-10 MED ORDER — AMOXICILLIN-POT CLAVULANATE 875-125 MG PO TABS
1.0000 | ORAL_TABLET | Freq: Two times a day (BID) | ORAL | Status: DC
  Administered 2021-01-10 – 2021-01-13 (×6): 1 via ORAL
  Filled 2021-01-10 (×6): qty 1

## 2021-01-10 MED ORDER — ENOXAPARIN SODIUM 30 MG/0.3ML IJ SOSY: 30.0000 mg | PREFILLED_SYRINGE | Freq: Two times a day (BID) | INTRAMUSCULAR | Status: DC

## 2021-01-10 MED ORDER — INSULIN ASPART 100 UNIT/ML IJ SOLN
0.0000 [IU] | Freq: Three times a day (TID) | INTRAMUSCULAR | Status: DC
  Administered 2021-01-10: 4 [IU] via SUBCUTANEOUS
  Administered 2021-01-11 – 2021-01-12 (×2): 3 [IU] via SUBCUTANEOUS
  Administered 2021-01-12 – 2021-01-13 (×3): 4 [IU] via SUBCUTANEOUS
  Administered 2021-01-14: 3 [IU] via SUBCUTANEOUS
  Administered 2021-01-14 (×2): 4 [IU] via SUBCUTANEOUS
  Administered 2021-01-15: 3 [IU] via SUBCUTANEOUS
  Administered 2021-01-15 (×2): 4 [IU] via SUBCUTANEOUS
  Administered 2021-01-16 – 2021-01-17 (×2): 3 [IU] via SUBCUTANEOUS
  Administered 2021-01-17: 4 [IU] via SUBCUTANEOUS
  Administered 2021-01-18: 3 [IU] via SUBCUTANEOUS
  Administered 2021-01-18: 4 [IU] via SUBCUTANEOUS
  Administered 2021-01-19 – 2021-01-20 (×3): 3 [IU] via SUBCUTANEOUS

## 2021-01-10 MED ORDER — ACETAMINOPHEN 325 MG PO TABS
650.0000 mg | ORAL_TABLET | Freq: Four times a day (QID) | ORAL | Status: DC | PRN
  Administered 2021-01-20 – 2021-01-21 (×2): 650 mg via ORAL
  Filled 2021-01-10 (×2): qty 2

## 2021-01-10 NOTE — Discharge Summary (Signed)
Physician Discharge Summary  Patient ID: Erin Davila MRN: [id number] DOB/AGE: 58/19/1962 58 y.o.  Admit date: 01/03/2021 Discharge date: 01/10/2021  Discharge Diagnoses Left Buttock abscess s/p I&D  T2DM, uncontrolled AKI, improving  HTN Morbid Obesity - BMI 58.96  Consultants Internal medicine  Nephrology   Procedures Incision and drainage of NSTI - (01/04/21) Dr. Luretha Murphy   HPI: The patient is a 60 year old female who presented to [facility] office with a subcutaneous abscess. PMHx of diabetes, hypertension, and morbid obesity presenting to the office with concerns of a left buttock abscess x 2 weeks.  She was seen at [facility] on 12/24/20 and underwent bedside I&D releasing a copious amount of purulent fluid.  Packing was left in place and she returned 2 days later at which time the area was slightly improved, but was still very tender.   Her son stated she started becoming lethargic a few days prior to being seen in the office. She stated a very foul odor began about a week prior.  She was taking doxycycline and pain medication, which caused vomiting.  Son stated she had a fever about a week ago but has not had one recently.  She admitted to having an abscess on her buttock about 10-15 years ago, but is not sure the exact location.  She does not take any blood thinners. She was direct admitted to the hospital for IV antibiotics and surgical evaluation for possible debridement in the OR.   Hospital Course: Patient underwent I&D as listed above. Internal medicine consulted for management of poorly controlled T2DM, HTN, and AKI. Nephrology was consulted for management of AKI as well, this improved with IV hydration. Foley was placed but removed prior to discharge. VAC dressing applied to wound and wound was improving. Blood sugar control was improving at time of discharge. Patient was evaluated by PT/OT who recommended CIR. On 01/10/21 patient was tolerating a diet, voiding  appropriately, VSS, and pain reasonably well controlled. She is discharged to CIR in stable condition.   She should continue 4 more days of PO antibiotics. She should work on transitioning to PO pain control for VAC changes prior to discharge home. I recommend increased dose of oxycodone 15 mg 1 hour prior to dressing changes. She should follow up in [facility] office in 1 month for wound check.   PE: Gen: NAD, comfortable, laying supine  CV: RRR Pulm: Normal work of breathing Back: VAC in place; no surrounding cellulitis; ss fluid in cannister; no frank purulence Ext: SCDs in place  Allergies as of 01/10/2021       Reactions   Lactose Other (See Comments)   GI complaints        Medication List     ASK your doctor about these medications    celecoxib 200 MG capsule Commonly known as: CELEBREX Take 200 mg by mouth daily. Ask about: Which instructions should I use?   diclofenac 75 MG EC tablet Commonly known as: VOLTAREN TAKE 1 TABLET (75 MG TOTAL) BY MOUTH 2 (TWO) TIMES DAILY.   doxycycline 100 MG capsule Commonly known as: VIBRAMYCIN Take 1 capsule (100 mg total) by mouth 2 (two) times daily for 10 days. Ask about: Should I take this medication?   Levemir FlexTouch 100 UNIT/ML FlexPen Generic drug: insulin detemir Inject 45 Units into the skin at bedtime. Ask about: Which instructions should I use?   metFORMIN 1000 MG tablet Commonly known as: GLUCOPHAGE Take 1,000 mg by mouth 2 (two)  times daily. Ask about: Which instructions should I use?   montelukast 10 MG tablet Commonly known as: SINGULAIR Take 10 mg by mouth at bedtime.   multivitamin tablet Take 1 tablet by mouth daily.   olmesartan-hydrochlorothiazide 40-25 MG tablet Commonly known as: BENICAR HCT Take 1 tablet by mouth daily.   simvastatin 40 MG tablet Commonly known as: ZOCOR Take 40 mg by mouth at bedtime. Ask about: Which instructions should I use?   traMADol 50 MG tablet Commonly known as:  ULTRAM Take 1 tablet (50 mg total) by mouth every 8 (eight) hours as needed.   traMADol 50 MG tablet Commonly known as: ULTRAM Take 1 tablet (50 mg total) by mouth every 8 (eight) hours as needed. PATIENT NEEDS OFFICE VISIT FOR ADDITIONAL REFILLS               Durable Medical Equipment  (From admission, onward)           Start     Ordered   01/04/21 1721  For home use only DME Negative pressure wound device  Once       Question Answer Comment  Frequency of dressing change 3 times per week   Length of need 3 Months   Dressing type Foam   Amount of suction 125 mm/Hg   Pressure application Continuous pressure   Supplies 10 canisters and 15 dressings per month for duration of therapy      01/04/21 1720               Signed: Juliet Rude , [facility] 01/10/2021, 3:54 PM Please see Amion for pager number during day hours 7:00am-4:30pm

## 2021-01-10 NOTE — Discharge Instructions (Addendum)
Inpatient Rehab Discharge Instructions  DYNA FIGUEREO Discharge date and time: No discharge date for patient encounter.   Activities/Precautions/ Functional Status: Activity: activity as tolerated Diet: diabetic diet Wound Care: Routine skin checks Functional status:  ___ No restrictions     ___ Walk up steps independently ___ 24/7 supervision/assistance   ___ Walk up steps with assistance ___ Intermittent supervision/assistance  ___ Bathe/dress independently ___ Walk with walker     __x_ Bathe/dress with assistance ___ Walk Independently    ___ Shower independently ___ Walk with assistance    ___ Shower with assistance ___ No alcohol     ___ Return to work/school ________  Special Instructions: No driving smoking or alcohol  Wound care.  Place saline moistened gauze into the left buttock wound covered with ABD pads and tape in place.  Change twice daily and if it becomes soiled or dislodged.   COMMUNITY REFERRALS UPON DISCHARGE:   STILL AWAITING RESPONSE FROM [facility] OR MAY TAKE REFERRAL Home Health:   PT & RN                  Agency: [facility] Phone:[phone]  Medical Equipment/Items Ordered:NONE NEEDED                                                 Agency/Supplier:NA   My questions have been answered and I understand these instructions. I will adhere to these goals and the provided educational materials after my discharge from the hospital.  Patient/Caregiver Signature _______________________________ Date __________  Clinician Signature _______________________________________ Date __________  Please bring this form and your medication list with you to all your follow-up doctor's appointments.

## 2021-01-10 NOTE — Progress Notes (Signed)
Horton Chinaulkar, Krutika P, MD   Physician  Physical Medicine and Rehabilitation  PMR Pre-admission      Signed  Date of Service:  01/09/2021  3:07 PM       Related encounter: Ad[facility] (Discharged) [facility]       Signed          Show:Clear all [x] Written[x] Templated[x] Copied  Added by: [x] Standley BrookingBoyette, Justyn Langham G, RN[x] Raulkar, Drema PryKrutika P, MD   [] Hover for details                                                                                                                                                                                                                                                                                                                                                                                                                                               PMR Admission Coordinator Pre-Admission Assessment   Patient: Erin MerlesJuanda G Davila is an 60 y.o., female MRN: [id number] DOB: 11/20/1960 Height: 5\' 8"  (172.7 cm) Weight: (!) 179.4 kg   Insurance Information HMO:     PPO:      PCP:      IPA:      80/20:      OTHER: PRIMARY: [employer]      Policy#: [id number]      Subscriber: pt CM Name: Lurena Joinerebecca  Phone#: [phone]     Fax#: [phone] Pre-Cert#: [id number] for 7 days      Employer: [employer] Benefits:  Phone #: [phone]     Name: 7/6 Eff. Date: 07/2007     Deduct: $500      Out of Pocket Max: $2000      Life Max: none CIR: 80%      SNF: 80% Outpatient: 80%     Co-Pay: 20% Home Health: 80%      Co-Pay: 120 visits per year DME: 80%     Co-Pay: 20% Providers: pt choice  SECONDARY: none   Financial Counselor:       Phone#:   The Engineer, materials Information Summary" for patients in Inpatient Rehabilitation Facilities with attached "Privacy Act  Statement-Health Care Records" was provided and verbally reviewed with: N/A   Emergency Contact Information Contact Information       Name Relation Home Work Mobile    Gaylord Sister [phone]   [phone]           Current Medical History  Patient Admitting Diagnosis: Debility   History of Present Illness: 60 year old right-handed female with history of insulin-dependent diabetes mellitus hypertension morbid obesity with BMI 60.14, CKD with baseline creatinine 2.0.  Presented 01/03/2021 after a direct admission from general surgery outpatient office for evaluation of necrotizing wound on the left buttock.  Patient recently had incision and drainage for left gluteal abscess was taking home antibiotics.  On presentation to the office of general surgery wound was found to be necrotic with persistent drainage and therefore she was sent for direct admission.  Admission chemistry sodium 129, glucose 685, BUN 74, creatinine 4.47, WBC 15,500, hemoglobin A1c 12.8, SARS coronavirus negative.  Renal services consulted for AKI with baseline creatinine 2.0.  Renal ultrasound showed no obstruction.  A Foley catheter tube was placed.  Maintained on gentle IV fluids with latest creatinine 3.17.  General surgery continues to follow patient and she did undergo sharp debridement incision and drainage 01/04/2021 per Dr. Daphine Deutscher.  Wound VAC was placed changed every Monday Wednesday Friday.  Patient remains on Zosyn for wound coverage.  Subcutaneous Lovenox for DVT prophylaxis.   Patient[facility] has been reviewed by the rehabilitation admission coordinator and physician.   Past Medical History      Past Medical History:  Diagnosis Date   Allergy     Diabetes mellitus without complication (HCC)     Hyperlipidemia     Hypertension        Family History   family history is not on file.   Prior Rehab/Hospitalizations Has the patient had prior rehab or hospitalizations  prior to admission? Yes   Has the patient had major surgery during 100 days prior to admission? Yes              Current Medications   Current Facility-Administered Medications:   0.9 %  sodium chloride infusion, , Intravenous, PRN, Jacinto Halim, PA-C, Last Rate: 10 mL/hr at 01/05/21 0310, 250 mL at 01/05/21 0310   0.9 %  sodium chloride infusion, , Intravenous, PRN, Jacinto Halim, PA-C, Stopped at 01/04/21 1107   acetaminophen (TYLENOL) tablet 650 mg, 650 mg, Oral, Q6H PRN **OR** acetaminophen (TYLENOL) suppository 650 mg, 650 mg, Rectal, Q6H PRN, Maczis, Elmer Sow, PA-C   Chlorhexidine Gluconate Cloth 2 % PADS 6 each, 6 each, Topical, Daily, Jacinto Halim, PA-C, 6 each at 01/09/21 1100   dextrose 50 % solution  0-50 mL, 0-50 mL, Intravenous, PRN, Jacinto Halim, PA-C   diphenhydrAMINE (BENADRYL) capsule 25 mg, 25 mg, Oral, Q6H PRN **OR** diphenhydrAMINE (BENADRYL) injection 25 mg, 25 mg, Intravenous, Q6H PRN, Maczis, Elmer Sow, PA-C   enoxaparin (LOVENOX) injection 30 mg, 30 mg, Subcutaneous, Q12H, Wofford, Drew A, RPH, 30 mg at 01/09/21 2353   fentaNYL (SUBLIMAZE) injection 25 mcg, 25 mcg, Intravenous, Once, [employer], PA-C   hydrALAZINE (APRESOLINE) injection 10 mg, 10 mg, Intravenous, Q2H PRN, Maczis, Michael M, PA-C   hyoscyamine (LEVSIN) 0.125 MG/5ML elixir 0.25 mg, 0.25 mg, Oral, Q6H PRN, Jacinto Halim, PA-C, 0.25 mg at 01/04/21 0332   insulin aspart (novoLOG) injection 0-20 Units, 0-20 Units, Subcutaneous, TID WC, Maczis, Elmer Sow, PA-C, 4 Units at 01/09/21 1708   insulin aspart (novoLOG) injection 0-5 Units, 0-5 Units, Subcutaneous, QHS, Maczis, Elmer Sow, PA-C, 2 Units at 01/06/21 2135   insulin glargine (LANTUS) injection 45 Units, 45 Units, Subcutaneous, Daily, Cipriano Bunker, MD, 45 Units at 01/09/21 0954   lip balm (CARMEX) ointment, , Topical, PRN, Jacinto Halim, PA-C, Given at 01/03/21 2354   MEDLINE mouth rinse, 15 mL, Mouth Rinse, BID, Maczis,  Elmer Sow, PA-C, 15 mL at 01/09/21 0955   methocarbamol (ROBAXIN) tablet 500 mg, 500 mg, Oral, Q6H PRN, Jacinto Halim, PA-C, 500 mg at 01/09/21 0813   morphine 2 MG/ML injection 2 mg, 2 mg, Intravenous, Q2H PRN, Jacinto Halim, PA-C, 2 mg at 01/10/21 0511   ondansetron (ZOFRAN-ODT) disintegrating tablet 4 mg, 4 mg, Oral, Q6H PRN **OR** ondansetron (ZOFRAN) injection 4 mg, 4 mg, Intravenous, Q6H PRN, Jacinto Halim, PA-C, 4 mg at 01/08/21 1717   [START ON 01/11/2021] oxyCODONE (Oxy IR/ROXICODONE) immediate release tablet 15 mg, 15 mg, Oral, Q M,W,F, Juliet Rude, PA-C   oxyCODONE (Oxy IR/ROXICODONE) immediate release tablet 5-10 mg, 5-10 mg, Oral, Q4H PRN, Jacinto Halim, PA-C, 10 mg at 01/09/21 2048   piperacillin-tazobactam (ZOSYN) IVPB 3.375 g, 3.375 g, Intravenous, Q8H, Maczis, Elmer Sow, PA-C, Last Rate: 12.5 mL/hr at 01/09/21 2355, 3.375 g at 01/09/21 2355   Patients Current Diet:  Diet Order                  Diet Carb Modified Fluid consistency: Thin; Room service appropriate? Yes  Diet effective now                         Precautions / Restrictions Precautions Precautions: None Precaution Comments: wound vac in place on L buttocks Restrictions Weight Bearing Restrictions: No    Has the patient had 2 or more falls or a fall with injury in the past year? No   Prior Activity Level Community (5-7x/wk): independent and driving   Prior Functional Level Self Care: Did the patient need help bathing, dressing, using the toilet or eating? Independent   Indoor Mobility: Did the patient need assistance with walking from room to room (with or without device)? Independent   Stairs: Did the patient need assistance with internal or external stairs (with or without device)? Independent   Functional Cognition: Did the patient need help planning regular tasks such as shopping or remembering to take medications? Independent   Home Assistive Devices / Equipment Home  Assistive Devices/Equipment: Cane (specify quad or straight) Home Equipment: None   Prior Device Use: Indicate devices/aids used by the patient prior to current illness, exacerbation or injury? None of the above   Current Functional Level Cognition  Overall Cognitive Status: Within Functional Limits for tasks assessed Orientation Level: Oriented X4    Extremity Assessment (includes Sensation/Coordination)   Upper Extremity Assessment: Overall WFL for tasks assessed  Lower Extremity Assessment: Defer to PT evaluation     ADLs   Overall ADL's : Needs assistance/impaired Eating/Feeding: Independent, Bed level Grooming: Wash/dry face, Supervision/safety, Standing Grooming Details (indicate cue type and reason): patient leans heavily onto sink for support to wash her face in standing, reports "a little light headed" Upper Body Bathing: Set up, Sitting, Bed level Lower Body Bathing: Minimal assistance, Sitting/lateral leans, Sit to/from stand Upper Body Dressing : Set up, Sitting Lower Body Dressing: Set up, Minimal assistance, Sitting/lateral leans, Sit to/from stand Lower Body Dressing Details (indicate cue type and reason): from seated position and significant time patient is able to don/doff socks and shoes during session, reports pain in buttock. min A in standing for safety Toilet Transfer: Min guard, RW, Ambulation Toilet Transfer Details (indicate cue type and reason): min G for safety with managing walker, able to take few steps to/from sink with significant time to stand Toileting- Clothing Manipulation and Hygiene: Maximal assistance, Sitting/lateral lean, Sit to/from stand Functional mobility during ADLs: Min guard, Rolling walker General ADL Comments: patient needing increased assistance for self care tasks due to pain limiting her overall activity tolerance, balance, safety     Mobility   Overal bed mobility: Needs Assistance Bed Mobility: Rolling Rolling: Min  assist Supine to sit: Min guard Sit to supine: Min assist General bed mobility comments: significant time needed for all bed mobility due to pain, assisted pt with rolling R and L x 2 for positioning in bed then for placement of bed pan, pt used bed rail and used therapist's hand to pull self into position.     Transfers   Overall transfer level: Needs assistance Equipment used: Rolling walker (2 wheeled) Transfers: Sit to/from Stand Sit to Stand: Min guard General transfer comment: pt declined transfers 2* needing to have a BM and being in too much pain     Ambulation / Gait / Stairs / Wheelchair Mobility   Ambulation/Gait Ambulation/Gait assistance: Editor, commissioning (Feet): 6 Feet Assistive device: Rolling walker (2 wheeled) Gait Pattern/deviations: Step-to pattern, Antalgic, Decreased step length - right, Decreased step length - left General Gait Details: limited gait distance dut to pt requesting to sit down. Gait velocity: decr.     Posture / Balance Balance Overall balance assessment: Needs assistance Sitting-balance support: Feet supported Sitting balance-Leahy Scale: Good Standing balance support: Bilateral upper extremity supported Standing balance-Leahy Scale: Poor Standing balance comment: reliant on UE support     Special needs/care consideration Date of Service:  01/09/2021  8:47 AM                      Signed            Show:Clear all [x] Written[x] Templated[] Copied   Added by: [x] , RN     [] Hover for details                                                  WOC Nurse wound follow up Patient receiving care in [facility]. Assisted with positioning for VAC change by primary RN, NT, and PA. Wound type: left buttock surgical Measurement: measured 7/4. Wound bed: nearly 100%  red, clean Drainage (amount, consistency, odor) dark red in cannister Periwound: intact and protected by skin barrier film and barrier ring along the lower  border Dressing procedure/placement/frequency: One piece of black foam removed from main wound bed, one small piece of black foam removed from small wound below the main wound. Drape applied, immediate seal obtained.  Patient had received premedication before process began.   Patient to receive oral pain medication at 0800 on Friday for an 0900 VAC change with PA.  Primary RN aware. Nursing instruction order placed with this information as well.   Helmut Muster, RN, MSN, CWOCN, CNS-BC, pager [phone]                         Hgb A1c 12.5   Previous Home Environment  Living Arrangements: Alone  Lives With: Alone (son from [place] for the summer) Available Help at Discharge: Family, Available PRN/intermittently Type of Home: House Home Layout: One level Home Access: Stairs to enter [employer] of Steps: 2 Bathroom Shower/Tub: Engineer, manufacturing systems: Handicapped height Bathroom Accessibility: Yes How Accessible: Accessible via walker Home Care Services: No   Discharge Living Setting Plans for Discharge Living Setting: Patient's home, Alone (son home from college for the summer) Type of Home at Discharge: House Discharge Home Layout: One level Discharge Home Access: Stairs to enter Entrance Stairs-Rails: None Entrance Stairs-Number of Steps: 2 Discharge Bathroom Shower/Tub: Tub/shower unit Discharge Bathroom Toilet: Handicapped height Discharge Bathroom Accessibility: Yes How Accessible: Accessible via walker Does the patient have any problems obtaining your medications?: No   Social/Family/Support Systems Patient Roles: Parent (employee) Contact Information: sister Anticipated Caregiver: sister and son prn Anticipated Caregiver's Contact Information: see above Caregiver Availability: Intermittent Discharge Plan Discussed with Primary Caregiver: Yes Is Caregiver In Agreement with Plan?: Yes Does Caregiver/Family have Issues with Lodging/Transportation  while Pt is in Rehab?: No   Goals Patient/Family Goal for Rehab: Mod I to supervision with PT and OT Expected length of stay: ELOS 7 to 10 days Pt/Family Agrees to Admission and willing to participate: Yes Program Orientation Provided & Reviewed with Pt/Caregiver Including Roles  & Responsibilities: Yes   Decrease burden of Care through IP rehab admission: n/a   Possible need for SNF placement upon discharge: not anticipated   Patient Condition: I have reviewed [facility], spoken with patient. I discussed via phone for inpatient rehabilitation assessment.  Patient will benefit from ongoing PT and OT, can actively participate in 3 hours of therapy a day 5 days of the week, and can make measurable gains during the admission.  Patient will also benefit from the coordinated team approach during an Inpatient Acute Rehabilitation admission.  The patient will receive intensive therapy as well as Rehabilitation physician, nursing, social worker, and care management interventions.  Due to bladder management, bowel management, safety, skin/wound care, disease management, medication administration, pain management, and patient education the patient requires 24 hour a day rehabilitation nursing.  The patient is currently min to mod assist overall with mobility and basic ADLs.  Discharge setting and therapy post discharge at home with home health is anticipated.  Patient has agreed to participate in the Acute Inpatient Rehabilitation Program and will admit today.   Preadmission Screen Completed By:  Clois Dupes, 01/10/2021 10:22 AM ______________________________________________________________________   Discussed status with Dr. Carlis Abbott on  01/09/2021 at 1025 and received approval for admission today.   Admission Coordinator:  Clois Dupes, RN, time  1025 Date  01/10/2021   Assessment/Plan: Diagnosis: Debility Does the need for close, 24 hr/day Medical  supervision in concert with the patient's rehab needs make it unreasonable for this patient to be served in a less intensive setting? Yes Co-Morbidities requiring supervision/potential complications: Type 2 DM, morbid obesity (BMI 60.14), HTN, cellulitis and buttock abscess, trochanteric bursitis of both hips, CKD Due to bladder management, bowel management, safety, skin/wound care, disease management, medication administration, pain management, and patient education, does the patient require 24 hr/day rehab nursing? Yes Does the patient require coordinated care of a physician, rehab nurse, PT, OT to address physical and functional deficits in the context of the above medical diagnosis(es)? Yes Addressing deficits in the following areas: balance, endurance, locomotion, strength, transferring, bowel/bladder control, bathing, dressing, feeding, grooming, toileting, and psychosocial support Can the patient actively participate in an intensive therapy program of at least 3 hrs of therapy 5 days a week? Yes The potential for patient to make measurable gains while on inpatient rehab is good Anticipated functional outcomes upon discharge from inpatient rehab: modified independent PT, modified independent OT, modified independent SLP Estimated rehab length of stay to reach the above functional goals is: 2-3 weeks Anticipated discharge destination: Home 10. Overall Rehab/Functional Prognosis: good     MD Signature: Sula Soda, MD          Revision History                                            Note Details  Author Horton Chin, MD File Time 01/10/2021 10:41 AM  Author Type Physician Status Signed  Last Editor Horton Chin, MD Service Physical Medicine and Rehabilitation  Hospital Acct # [id number] Admit Date 01/10/2021

## 2021-01-10 NOTE — Progress Notes (Signed)
PROGRESS NOTE    ZOII FLORER  DJM:[id number] DOB: [birth year] DOA: 01/03/2021 PCP: [name], MD    Brief Narrative:  This 60 year old female with history of insulin-dependent diabetes type 2, hypertension, morbid obesity who presented as direct admission from general surgery outpatient office for evaluation of necrotizing wound on the left buttock.  We were consulted for the management of hyperglycemia /AKI.  She recently had incision and drainage for her left gluteal abscess and was taking antibiotics at home. On presentation to the office at general surgery, wound was found to be necrotic with persistent drainage and therefore she was sent for direct admission.  On presentation she was severely hyperglycemic, lab work showed severe AKI with creatinine in the range of 4, no history of CKD.  Assessment & Plan:   Principal Problem:   Diabetes mellitus type 2 in obese [facility]) Active Problems:   HTN (hypertension)   Morbid obesity (HCC)   Cellulitis and abscess of buttock   Hyperglycemia due to diabetes mellitus (HCC)   Left buttock abscess  Diabetes type 2/ Hyperglycemia:  She takes 45 units Levemir insulin daily at home . She presented with severe hyperglycemia.   She was started on insulin drip initially, changed to Lantus and sliding scale.  Monitor blood sugars.   Hb A1c 12.5.  Continue Levemir to 45 units daily, Continue sliding scale to resistant. Diabetic consult appreciated.  Blood glucose improved.   AKI:  > Improving Creatinine in the range of 4 on presentation.  She takes olmesartan at home.  No history of CKD.   AKI is most likely prerenal secondary to polyuria causing volume depletion.   Renal Ultrasound did not show any obstruction.  continued on IV fluids, slight improvement in creatinine. Discontinue IV fluids, encourage oral intake,   Avoid nephrotoxins.  Nephro: No indication for hemodialysis.  Patient is making urine. Creatinine is  improving and trending  down. Nephrology signed off,  continue to monitor serum creatinine. Remove Foley catheter and do voiding trial.   Severe sepsis/septic shock: > Resolved. She presented with severe leukocytosis, hypotension.   Continue antibiotics.  Blood pressure is better now.   Necrotizing left gluteal wound:  Management as per general surgery.  Continue antibiotics( Augmentin x 4 days)   Hypertension: She takes olmesartan.  Currently on hold due to AKI.  Monitor blood pressure.     Morbid obesity: BMI of 57. Diet counseling completed.   Hypokalemia: Replaced . Continue to monitor.   We are signing off please reconsult if needed.   Subjective: Patient was seen and examined at bedside,  overnight events noted.  Patient reports feeling much improved. Patient reports she is going to be discharged to rehab today.  Wound care is on the board.  Objective: Vitals:   01/09/21 0501 01/09/21 1513 01/09/21 2044 01/10/21 0351  BP: 134/75 (!) 145/46 (!) 143/91 108/70  Pulse: 83 87 92 85  Resp: 20 18 20 19   Temp: 98.7 F (37.1 C) 98.2 F (36.8 C) 98.8 F (37.1 C) 98.6 F (37 C)  TempSrc: Oral Oral    SpO2: 99% 100% 100% 100%  Weight:      Height:        Intake/Output Summary (Last 24 hours) at 01/10/2021 1118 Last data filed at 01/10/2021 0955 Gross per 24 hour  Intake 294.33 ml  Output 1275 ml  Net -980.67 ml   Filed Weights   01/07/21 0352 01/08/21 0500 01/09/21 0500  Weight: (!) 178.9 kg (!) 175.9 kg 03/12/21)  179.4 kg    Examination:  General exam: Appears calm and comfortable, not in any acute distress.  Appears cheerful. Respiratory system: Clear to auscultation. Respiratory effort normal. Cardiovascular system: S1 & S2 heard, RRR. No JVD, murmurs, rubs, gallops or clicks. No pedal edema. Gastrointestinal system: Abdomen is nondistended, soft and nontender. No organomegaly or masses felt.  Normal bowel sounds heard. Central nervous system: Alert and oriented. No focal neurological  deficits. Extremities: Symmetric 5 x 5 power. Skin: There is wound noted on left buttock. Dressing noted. Psychiatry: Judgement and insight appear normal. Mood & affect appropriate.     Data Reviewed: I have personally reviewed following labs and imaging studies  CBC: Recent Labs  Lab 01/03/21 2021 01/04/21 0228 01/05/21 0543 01/06/21 0529 01/09/21 0425  WBC 15.5* 14.3* 17.2* 14.8* 13.3*  NEUTROABS 13.0*  --  14.3*  --   --   HGB 11.0* 11.4* 9.7* 9.8* 8.1*  HCT 34.3* 35.9* 30.8* 31.7* 26.7*  MCV 95.5 95.7 97.2 99.1 101.5*  PLT 376 410* 364 376 402*   Basic Metabolic Panel: Recent Labs  Lab 01/06/21 0529 01/07/21 0645 01/08/21 0423 01/09/21 0425 01/10/21 0449  NA 139 140 143 143 140  K 4.0 3.8 3.7 3.7 3.6  CL 102 103 107 106 105  CO2 25 27 27 28 26   GLUCOSE 136* 229* 109* 142* 136*  BUN 72* 54* 41* 34* 29*  CREATININE 4.72* 3.91* 3.38* 3.34* 3.17*  CALCIUM 8.5* 8.2* 8.4* 8.3* 8.4*  MG 2.1  --   --   --   --   PHOS 4.4 3.8 3.5 3.1 3.5   GFR: Estimated Creatinine Clearance: 33.6 mL/min (A) (by C-G formula based on SCr of 3.17 mg/dL (H)). Liver Function Tests: Recent Labs  Lab 01/03/21 2021 01/07/21 0645 01/08/21 0423 01/09/21 0425 01/10/21 0449  AST 11*  --   --   --   --   ALT 15  --   --   --   --   ALKPHOS 188*  --   --   --   --   BILITOT 0.8  --   --   --   --   PROT 8.2*  --   --   --   --   ALBUMIN 2.7* 2.2* 2.1* 2.1* 2.4*   No results for input(s): LIPASE, AMYLASE in the last 168 hours. No results for input(s): AMMONIA in the last 168 hours. Coagulation Profile: No results for input(s): INR, PROTIME in the last 168 hours. Cardiac Enzymes: No results for input(s): CKTOTAL, CKMB, CKMBINDEX, TROPONINI in the last 168 hours. BNP (last 3 results) No results for input(s): PROBNP in the last 8760 hours. HbA1C: No results for input(s): HGBA1C in the last 72 hours.  CBG: Recent Labs  Lab 01/09/21 0752 01/09/21 1216 01/09/21 1618 01/09/21 2046  01/10/21 0730  GLUCAP 117* 149* 184* 187* 141*   Lipid Profile: No results for input(s): CHOL, HDL, LDLCALC, TRIG, CHOLHDL, LDLDIRECT in the last 72 hours. Thyroid Function Tests: No results for input(s): TSH, T4TOTAL, FREET4, T3FREE, THYROIDAB in the last 72 hours. Anemia Panel: No results for input(s): VITAMINB12, FOLATE, FERRITIN, TIBC, IRON, RETICCTPCT in the last 72 hours. Sepsis Labs: No results for input(s): PROCALCITON, LATICACIDVEN in the last 168 hours.  Recent Results (from the past 240 hour(s))  Surgical PCR screen     Status: None   Collection Time: 01/03/21  9:00 PM   Specimen: Nasal Mucosa; Nasal Swab  Result Value Ref Range Status  MRSA, PCR NEGATIVE NEGATIVE Final   Staphylococcus aureus NEGATIVE NEGATIVE Final    Comment: (NOTE) The Xpert SA Assay (FDA approved for NASAL specimens in patients 27 years of age and older), is one component of a comprehensive surveillance program. It is not intended to diagnose infection nor to guide or monitor treatment. Performed at [facility], [address], [place], [place] [address]   SARS Coronavirus 2 by RT PCR (hospital order, performed in [facility] hospital lab) Nasopharyngeal Nasal Mucosa     Status: None   Collection Time: 01/03/21  9:13 PM   Specimen: Nasal Mucosa; Nasopharyngeal  Result Value Ref Range Status   SARS Coronavirus 2 NEGATIVE NEGATIVE Final    Comment: (NOTE) SARS-CoV-2 target nucleic acids are NOT DETECTED.  The SARS-CoV-2 RNA is generally detectable in upper and lower respiratory specimens during the acute phase of infection. The lowest concentration of SARS-CoV-2 viral copies this assay can detect is 250 copies / mL. A negative result does not preclude SARS-CoV-2 infection and should not be used as the sole basis for treatment or other patient management decisions.  A negative result may occur with improper specimen collection / handling, submission of specimen other than  nasopharyngeal swab, presence of viral mutation(s) within the areas targeted by this assay, and inadequate number of viral copies (<250 copies / mL). A negative result must be combined with clinical observations, patient history, and epidemiological information.  Fact Sheet for Patients:   BoilerBrush.com.cy  Fact Sheet for Healthcare Providers: https://pope.com/  This test is not yet approved or  cleared by the [place] FDA and has been authorized for detection and/or diagnosis of SARS-CoV-2 by FDA under an Emergency Use Authorization (EUA).  This EUA will remain in effect (meaning this test can be used) for the duration of the COVID-19 declaration under Section 564(b)(1) of the Act, 21 U.S.C. section 360bbb-3(b)(1), unless the authorization is terminated or revoked sooner.  Performed at [facility], [address] [place], [place] [address]       Radiology Studies: No results found.  Scheduled Meds:  amoxicillin-clavulanate  1 tablet Oral Q12H   Chlorhexidine Gluconate Cloth  6 each Topical Daily   enoxaparin (LOVENOX) injection  30 mg Subcutaneous Q12H   fentaNYL (SUBLIMAZE) injection  25 mcg Intravenous Once   insulin aspart  0-20 Units Subcutaneous TID WC   insulin aspart  0-5 Units Subcutaneous QHS   insulin glargine  45 Units Subcutaneous Daily   mouth rinse  15 mL Mouth Rinse BID   [START ON 01/11/2021] oxyCODONE  15 mg Oral Q M,W,F   Continuous Infusions:  sodium chloride 250 mL (01/05/21 0310)   sodium chloride Stopped (01/04/21 1107)     LOS: 7 days    Time spent: 25 mins    Lagena Strand, MD [facility]   If 7PM-7AM, please contact night-coverage

## 2021-01-10 NOTE — H&P (Signed)
Physical Medicine and Rehabilitation Admission H&P  Erin Davila is a 60 year old right-handed female with history of insulin-dependent diabetes mellitus hypertension morbid obesity with BMI 60.14, CKD with baseline creatinine 2.0.  Per chart review patient lives alone.  1 level home 2 steps to entry.  Reportedly independent and active prior to admission.  She has a son home from college for the summer.  Presented 01/03/2021 after a direct admission from general surgery outpatient office for evaluation of necrotizing wound on the left buttock.  Patient recently had incision and drainage for left gluteal abscess was taking home antibiotics.  On presentation to the office of general surgery wound was found to be necrotic with persistent drainage and therefore she was sent for direct admission.  Admission chemistry sodium 129, glucose 685, BUN 74, creatinine 4.47, WBC 15,500, hemoglobin A1c 12.8, SARS coronavirus negative.  Renal services consulted for AKI with baseline creatinine 2.0.  Renal ultrasound showed no obstruction.  A Foley catheter tube was placed.  Maintained on gentle IV fluids with latest creatinine 3.17.  General surgery continues to follow patient and she did undergo sharp debridement incision and drainage 01/04/2021 per Dr. Daphine Deutscher.  Wound VAC was placed changed every Monday Wednesday Friday.  Patient placed on  Zosyn 01/03/2021 for wound coverage transitioned to Augmentin 01/10/2021.  Subcutaneous Lovenox for DVT prophylaxis.  Therapy evaluations completed due to patient decreased functional mobility was admitted for a comprehensive rehab program. C/o fatigue.  Review of Systems  Constitutional:  Positive for malaise/fatigue. Negative for chills and fever.  HENT:  Negative for hearing loss.   Eyes:  Negative for blurred vision and double vision.  Respiratory:  Negative for cough and shortness of breath.   Cardiovascular:  Positive for leg swelling. Negative for chest pain and  palpitations.  Gastrointestinal:  Positive for constipation. Negative for heartburn, nausea and vomiting.  Genitourinary:  Negative for dysuria, flank pain and hematuria.  Musculoskeletal:  Positive for joint pain and myalgias.  Skin:  Negative for rash.  All other systems reviewed and are negative. Past Medical History:  Diagnosis Date   Allergy    Diabetes mellitus without complication (HCC)    Hyperlipidemia    Hypertension    Past Surgical History:  Procedure Laterality Date   CESAREAN SECTION     INCISION AND DRAINAGE ABSCESS N/A 01/04/2021   Procedure: INCISION AND DRAINAGE LEFT BUTTOCK ABSCESS;  Surgeon: Luretha Murphy, MD;  Location: [facility];  Service: General;  Laterality: N/A;   History reviewed. No pertinent family history. Social History:  reports that she has never smoked. She has never used smokeless tobacco. She reports that she does not drink alcohol and does not use drugs. Allergies:  Allergies  Allergen Reactions   Lactose Other (See Comments)    GI complaints   Facility-Administered Medications Prior to Admission  Medication Dose Route Frequency Provider Last Rate Last Admin   methylPREDNISolone acetate (DEPO-MEDROL) injection 40 mg  40 mg Intra-articular Once Le, Thao P, DO       methylPREDNISolone acetate (DEPO-MEDROL) injection 40 mg  40 mg Intramuscular Once Le, Thao P, DO       Medications Prior to Admission  Medication Sig Dispense Refill   celecoxib (CELEBREX) 200 MG capsule Take 200 mg by mouth daily.     [EXPIRED] doxycycline (VIBRAMYCIN) 100 MG capsule Take 1 capsule (100 mg total) by mouth 2 (two) times daily for 10 days. 20 capsule 0   LEVEMIR FLEXTOUCH 100 UNIT/ML FlexPen Inject 45  Units into the skin at bedtime.     metFORMIN (GLUCOPHAGE) 1000 MG tablet Take 1,000 mg by mouth 2 (two) times daily.     montelukast (SINGULAIR) 10 MG tablet Take 10 mg by mouth at bedtime.     Multiple Vitamin (MULTIVITAMIN) tablet Take 1 tablet by mouth daily.      olmesartan-hydrochlorothiazide (BENICAR HCT) 40-25 MG per tablet Take 1 tablet by mouth daily.     simvastatin (ZOCOR) 40 MG tablet Take 40 mg by mouth at bedtime.     diclofenac (VOLTAREN) 75 MG EC tablet TAKE 1 TABLET (75 MG TOTAL) BY MOUTH 2 (TWO) TIMES DAILY. (Patient not taking: Reported on 01/05/2021) 180 tablet 0   traMADol (ULTRAM) 50 MG tablet Take 1 tablet (50 mg total) by mouth every 8 (eight) hours as needed. (Patient not taking: No sig reported) 30 tablet 0   traMADol (ULTRAM) 50 MG tablet Take 1 tablet (50 mg total) by mouth every 8 (eight) hours as needed. PATIENT NEEDS OFFICE VISIT FOR ADDITIONAL REFILLS (Patient not taking: No sig reported) 30 tablet 0    Drug Regimen Review Drug regimen was reviewed and remains appropriate with no significant issues identified.  Home: Home Living Family/patient expects to be discharged to:: Private residence Living Arrangements: Alone Available Help at Discharge: Family, Available PRN/intermittently Type of Home: House Home Access: Stairs to enter Secretary/administrator of Steps: 2 Home Layout: One level Bathroom Shower/Tub: Engineer, manufacturing systems: Handicapped height Bathroom Accessibility: Yes Home Equipment: None  Lives With: Alone (son from [place] for the summer)   Functional History: Prior Function Level of Independence: Independent Comments: leads the choir, very active and independent, drives and  works  Functional Status:  Mobility: Bed Mobility Overal bed mobility: Needs Assistance Bed Mobility: Rolling Rolling: Min assist Supine to sit: Min guard Sit to supine: Min assist General bed mobility comments: significant time needed for all bed mobility due to pain, assisted pt with rolling R and L x 2 for positioning in bed then for placement of bed pan, pt used bed rail and used therapist's hand to pull self into position. Transfers Overall transfer level: Needs assistance Equipment used: Rolling walker (2  wheeled) Transfers: Sit to/from Stand Sit to Stand: Min guard General transfer comment: pt declined transfers 2* needing to have a BM and being in too much pain Ambulation/Gait Ambulation/Gait assistance: Min assist Gait Distance (Feet): 6 Feet Assistive device: Rolling walker (2 wheeled) Gait Pattern/deviations: Step-to pattern, Antalgic, Decreased step length - right, Decreased step length - left General Gait Details: limited gait distance dut to pt requesting to sit down. Gait velocity: decr.    ADL: ADL Overall ADL's : Needs assistance/impaired Eating/Feeding: Independent, Bed level Grooming: Wash/dry face, Supervision/safety, Standing Grooming Details (indicate cue type and reason): patient leans heavily onto sink for support to wash her face in standing, reports "a little light headed" Upper Body Bathing: Set up, Sitting, Bed level Lower Body Bathing: Minimal assistance, Sitting/lateral leans, Sit to/from stand Upper Body Dressing : Set up, Sitting Lower Body Dressing: Set up, Minimal assistance, Sitting/lateral leans, Sit to/from stand Lower Body Dressing Details (indicate cue type and reason): from seated position and significant time patient is able to don/doff socks and shoes during session, reports pain in buttock. min A in standing for safety Toilet Transfer: Min guard, RW, Ambulation Toilet Transfer Details (indicate cue type and reason): min G for safety with managing walker, able to take few steps to/from sink with significant time to stand  Toileting- Clothing Manipulation and Hygiene: Maximal assistance, Sitting/lateral lean, Sit to/from stand Functional mobility during ADLs: Min guard, Rolling walker General ADL Comments: patient needing increased assistance for self care tasks due to pain limiting her overall activity tolerance, balance, safety  Cognition: Cognition Overall Cognitive Status: Within Functional Limits for tasks assessed Orientation Level: Oriented  X4 Cognition Arousal/Alertness: Awake/alert Behavior During Therapy: WFL for tasks assessed/performed Overall Cognitive Status: Within Functional Limits for tasks assessed  Physical Exam: Blood pressure 108/70, pulse 85, temperature 98.6 F (37 C), resp. rate 19, height  (1.727 m), weight (!) 179.4 kg, last menstrual period 03/26/2015, SpO2 100 %. Physical Exam Gen: no distress, normal appearing HEENT: oral mucosa pink and moist, NCAT Cardio: Reg rate Chest: normal effort, normal rate of breathing Abd: soft, non-distended Ext: no edema Psych: pleasant, normal affect Skin:    Comments: Wound VAC in place to left buttock abscess.  Neurological:     Comments: Patient is alert.  No acute distress.  Oriented x3.   Results for orders placed or performed during the hospital encounter of 01/03/21 (from the past 48 hour(s))  Glucose, capillary     Status: Abnormal   Collection Time: 01/08/21  4:39 PM  Result Value Ref Range   Glucose-Capillary 158 (H) 70 - 99 mg/dL    Comment: Glucose reference range applies only to samples taken after fasting for at least 8 hours.  Glucose, capillary     Status: Abnormal   Collection Time: 01/08/21  8:57 PM  Result Value Ref Range   Glucose-Capillary 125 (H) 70 - 99 mg/dL    Comment: Glucose reference range applies only to samples taken after fasting for at least 8 hours.  Renal function panel     Status: Abnormal   Collection Time: 01/09/21  4:25 AM  Result Value Ref Range   Sodium 143 135 - 145 mmol/L   Potassium 3.7 3.5 - 5.1 mmol/L   Chloride 106 98 - 111 mmol/L   CO2 28 22 - 32 mmol/L   Glucose, Bld 142 (H) 70 - 99 mg/dL    Comment: Glucose reference range applies only to samples taken after fasting for at least 8 hours.   BUN 34 (H) 6 - 20 mg/dL   Creatinine, Ser 4.69 (H) 0.44 - 1.00 mg/dL   Calcium 8.3 (L) 8.9 - 10.3 mg/dL   Phosphorus 3.1 2.5 - 4.6 mg/dL   Albumin 2.1 (L) 3.5 - 5.0 g/dL   GFR, Estimated 15 (L) >60 mL/min     Comment: (NOTE) Calculated using the CKD-EPI Creatinine Equation (2021)    Anion gap 9 5 - 15    Comment: Performed at [facility], [address] [place], [place] [address]  CBC     Status: Abnormal   Collection Time: 01/09/21  4:25 AM  Result Value Ref Range   WBC 13.3 (H) 4.0 - 10.5 K/uL   RBC 2.63 (L) 3.87 - 5.11 MIL/uL   Hemoglobin 8.1 (L) 12.0 - 15.0 g/dL   HCT 84.1 (L) 32.4 - 40.1 %   MCV 101.5 (H) 80.0 - 100.0 fL   MCH 30.8 26.0 - 34.0 pg   MCHC 30.3 30.0 - 36.0 g/dL   RDW 02.7 25.3 - 66.4 %   Platelets 402 (H) 150 - 400 K/uL   nRBC 0.0 0.0 - 0.2 %    Comment: Performed at [facility], [address] [place], [place] [address]  Glucose, capillary     Status:  Abnormal   Collection Time: 01/09/21  7:52 AM  Result Value Ref Range   Glucose-Capillary 117 (H) 70 - 99 mg/dL    Comment: Glucose reference range applies only to samples taken after fasting for at least 8 hours.   Comment 1 Notify RN    Comment 2 Document in Chart   Glucose, capillary     Status: Abnormal   Collection Time: 01/09/21 12:16 PM  Result Value Ref Range   Glucose-Capillary 149 (H) 70 - 99 mg/dL    Comment: Glucose reference range applies only to samples taken after fasting for at least 8 hours.   Comment 1 Notify RN    Comment 2 Document in Chart   Glucose, capillary     Status: Abnormal   Collection Time: 01/09/21  4:18 PM  Result Value Ref Range   Glucose-Capillary 184 (H) 70 - 99 mg/dL    Comment: Glucose reference range applies only to samples taken after fasting for at least 8 hours.  Glucose, capillary     Status: Abnormal   Collection Time: 01/09/21  8:46 PM  Result Value Ref Range   Glucose-Capillary 187 (H) 70 - 99 mg/dL    Comment: Glucose reference range applies only to samples taken after fasting for at least 8 hours.  Renal function panel     Status: Abnormal   Collection Time: 01/10/21  4:49 AM  Result Value Ref Range   Sodium 140 135 - 145  mmol/L   Potassium 3.6 3.5 - 5.1 mmol/L   Chloride 105 98 - 111 mmol/L   CO2 26 22 - 32 mmol/L   Glucose, Bld 136 (H) 70 - 99 mg/dL    Comment: Glucose reference range applies only to samples taken after fasting for at least 8 hours.   BUN 29 (H) 6 - 20 mg/dL   Creatinine, Ser 5.39 (H) 0.44 - 1.00 mg/dL   Calcium 8.4 (L) 8.9 - 10.3 mg/dL   Phosphorus 3.5 2.5 - 4.6 mg/dL   Albumin 2.4 (L) 3.5 - 5.0 g/dL   GFR, Estimated 16 (L) >60 mL/min    Comment: (NOTE) Calculated using the CKD-EPI Creatinine Equation (2021)    Anion gap 9 5 - 15    Comment: Performed at [facility], [address] [place], [place] [address]  Glucose, capillary     Status: Abnormal   Collection Time: 01/10/21  7:30 AM  Result Value Ref Range   Glucose-Capillary 141 (H) 70 - 99 mg/dL    Comment: Glucose reference range applies only to samples taken after fasting for at least 8 hours.  Glucose, capillary     Status: Abnormal   Collection Time: 01/10/21 11:27 AM  Result Value Ref Range   Glucose-Capillary 151 (H) 70 - 99 mg/dL    Comment: Glucose reference range applies only to samples taken after fasting for at least 8 hours.   No results found.     Medical Problem List and Plan: 1.  Severe sepsis/septic shock secondary to necrotizing left gluteal wound.  Status post I&D wound debridement 01/04/2021 per Dr. Daphine Deutscher.  Follow-up general surgery.  Wound VAC changes as directed  -patient may not shower while wound vac in place  -ELOS/Goals: 2-3 weeks modI 2.  Impaired mobility, not ambulating: Continue Lovenox  -antiplatelet therapy: N/A 3. Post-surgical pain: continue Oxycodone 5 to 10 mg every 4 hours as needed as well as 15 mg Monday Wednesday Friday with wound VAC changes as needed and Robaxin as needed-  using regularly.  Check vitamin D level tomorrow.  4. Mood: Provide emotional support  -antipsychotic agents: N/A 5. Neuropsych: This patient is capable of making decisions on her own  behalf. 6. Skin/Wound Care: Routine skin checks 7. Fluids/Electrolytes/Nutrition: Routine in and outs with follow-up chemistries 8.  ID.  Intravenous Zosyn 01/03/2021 and transition to Augmentin 01/10/2021. 9.  Diabetes mellitus.  Hemoglobin A1c 12.3.  Lantus insulin 45 units daily.  Check blood sugars before meals and at bedtime. CBGs currently 151-187.  10.  AKI/multifactorial.  Follow-up nephrology services.  Renal ultrasound unremarkable. 11.  Obesity.  BMI 60.14.  Dietary follow-up -check magnesium level tomorrow  I have personally performed a face to face diagnostic evaluation, including, but not limited to relevant history and physical exam findings, of this patient and developed relevant assessment and plan.  Additionally, I have reviewed and concur with the physician assistant's documentation above.  Horton ChinKrutika P Markees Carns, MD 01/10/2021   Mcarthur Rossettianiel J Angiulli, PA-C

## 2021-01-10 NOTE — Progress Notes (Signed)
Patient ID: Erin Davila, female   DOB: 05-Jun-1961, 60 y.o.   MRN: [id number] Met with the patient and son/sister to introduce self and role of the nurse CM. Reviewed educational needs including wound vac care, skin care and DM mgmt, HLD , CKD and low albumin level. Patient given information on CMM diet and wound vac care at home and reviewed use of insulin pen with family for back up as needed. Continue to follow along to discharge to address questions and skin care education. Collaborate with the SW to facilitate preparation for discharge home. Margarito Liner

## 2021-01-10 NOTE — Progress Notes (Signed)
Inpatient Rehabilitation Admissions Coordinator   I have insurance approval to admit to CIR today. I spoke with patient by phone and she is in agreement to admit. I will arrange [facility] transport for pickup at 1230 and patient to be admitted to 5c09 with Dr. Carlis Abbott. I contacted Trixie Deis, PA and she will complete discharge.   Ottie Glazier, RN, MSN Rehab Admissions Coordinator [phone] 01/10/2021 10:12 AM

## 2021-01-10 NOTE — Progress Notes (Signed)
Pt has c/o the pressure sensation of needing to urinate, pt has a 16Fr foley in place. This was changed out on 7/4 due to leaking of urine. Balloon was checked and is fully inflated @10cc  but pt had urine leakage. Bed linens changed after pain meds given. Pt is unable to lay on her back d/t pain & wound vac. Urine is draining thru the tubing. Will cont to monitor.

## 2021-01-10 NOTE — Progress Notes (Signed)
Inpatient Rehabilitation Medication Review by a Pharmacist  A complete drug regimen review was completed for this patient to identify any potential clinically significant medication issues.  Clinically significant medication issues were identified:  no  Check AMION for pharmacist assigned to patient if future medication questions/issues arise during this admission.  Pharmacist comments:   Time spent performing this drug regimen review (minutes):  10   Mosetta Anis 01/10/2021 5:08 PM

## 2021-01-10 NOTE — Progress Notes (Signed)
INPATIENT REHABILITATION ADMISSION NOTE   Arrival Method:  [facility]     Mental Orientation: alert   Assessment:done   Skin:wound vac   IV'S:rt forearm with rocephine IV ongoing   Pain: 6/10   Tubes and Drains:wound vac   Safety Measures:done   Vital Signs:done   Height and Weight:done   Rehab Orientation:done   Family:with patient after transfer    Notes:

## 2021-01-10 NOTE — Progress Notes (Signed)
Wound vac leaking. RN and CN changed dressing placed 3 piece of black foam on wound; bridged to left hip. Pain med given.

## 2021-01-10 NOTE — H&P (Signed)
Physical Medicine and Rehabilitation Admission H&P  CC: Debility  ZOX:[id number]:Erin Davila is a 60 year old right-handed female with history of insulin-dependent diabetes mellitus hypertension morbid obesity with BMI 60.14, CKD with baseline creatinine 2.0.  Per chart review patient lives alone.  1 level home 2 steps to entry.  Reportedly independent and active prior to admission.  She has a son home from college for the summer.  Presented 01/03/2021 after a direct admission from general surgery outpatient office for evaluation of necrotizing wound on the left buttock.  Patient recently had incision and drainage for left gluteal abscess was taking home antibiotics.  On presentation to the office of general surgery wound was found to be necrotic with persistent drainage and therefore she was sent for direct admission.  Admission chemistry sodium 129, glucose 685, BUN 74, creatinine 4.47, WBC 15,500, hemoglobin A1c 12.8, SARS coronavirus negative.  Renal services consulted for AKI with baseline creatinine 2.0.  Renal ultrasound showed no obstruction.  A Foley catheter tube was placed.  Maintained on gentle IV fluids with latest creatinine 3.17.  General surgery continues to follow patient and she did undergo sharp debridement incision and drainage 01/04/2021 per Dr. Daphine DeutscherMartin.  Wound VAC was placed changed every Monday Wednesday Friday.  Patient placed on  Zosyn 01/03/2021 for wound coverage transitioned to Augmentin 01/10/2021.  Subcutaneous Lovenox for DVT prophylaxis.  Therapy evaluations completed due to patient decreased functional mobility was admitted for a comprehensive rehab program. C/o fatigue.  Review of Systems  Constitutional:  Positive for malaise/fatigue. Negative for chills and fever.  HENT:  Negative for hearing loss.   Eyes:  Negative for blurred vision and double vision.  Respiratory:  Negative for cough and shortness of breath.   Cardiovascular:  Positive for leg swelling. Negative for chest  pain and palpitations.  Gastrointestinal:  Positive for constipation. Negative for heartburn, nausea and vomiting.  Genitourinary:  Negative for dysuria, flank pain and hematuria.  Musculoskeletal:  Positive for joint pain and myalgias.  Skin:  Negative for rash.  All other systems reviewed and are negative. Past Medical History:  Diagnosis Date   Allergy    Diabetes mellitus without complication (HCC)    Hyperlipidemia    Hypertension    Past Surgical History:  Procedure Laterality Date   CESAREAN SECTION     INCISION AND DRAINAGE ABSCESS N/A 01/04/2021   Procedure: INCISION AND DRAINAGE LEFT BUTTOCK ABSCESS;  Surgeon: Luretha MurphyMartin, Matthew, MD;  Location: [facility];  Service: General;  Laterality: N/A;   No family history on file. Social History:  reports that she has never smoked. She has never used smokeless tobacco. She reports that she does not drink alcohol and does not use drugs. Allergies:  Allergies  Allergen Reactions   Lactose Other (See Comments)    GI complaints   Facility-Administered Medications Prior to Admission  Medication Dose Route Frequency Provider Last Rate Last Admin   methylPREDNISolone acetate (DEPO-MEDROL) injection 40 mg  40 mg Intra-articular Once Le, Thao P, DO       methylPREDNISolone acetate (DEPO-MEDROL) injection 40 mg  40 mg Intramuscular Once Le, Thao P, DO       Medications Prior to Admission  Medication Sig Dispense Refill   celecoxib (CELEBREX) 200 MG capsule Take 200 mg by mouth daily.     diclofenac (VOLTAREN) 75 MG EC tablet TAKE 1 TABLET (75 MG TOTAL) BY MOUTH 2 (TWO) TIMES DAILY. (Patient not taking: Reported on 01/05/2021) 180 tablet 0   LEVEMIR  FLEXTOUCH 100 UNIT/ML FlexPen Inject 45 Units into the skin at bedtime.     metFORMIN (GLUCOPHAGE) 1000 MG tablet Take 1,000 mg by mouth 2 (two) times daily.     montelukast (SINGULAIR) 10 MG tablet Take 10 mg by mouth at bedtime.     Multiple Vitamin (MULTIVITAMIN) tablet Take 1 tablet by mouth daily.      olmesartan-hydrochlorothiazide (BENICAR HCT) 40-25 MG per tablet Take 1 tablet by mouth daily.     simvastatin (ZOCOR) 40 MG tablet Take 40 mg by mouth at bedtime.     traMADol (ULTRAM) 50 MG tablet Take 1 tablet (50 mg total) by mouth every 8 (eight) hours as needed. (Patient not taking: No sig reported) 30 tablet 0   traMADol (ULTRAM) 50 MG tablet Take 1 tablet (50 mg total) by mouth every 8 (eight) hours as needed. PATIENT NEEDS OFFICE VISIT FOR ADDITIONAL REFILLS (Patient not taking: No sig reported) 30 tablet 0    Drug Regimen Review Drug regimen was reviewed and remains appropriate with no significant issues identified.  Home:     Functional History:    Functional Status:  Mobility:          ADL:    Cognition:      Physical Exam: Last menstrual period 03/26/2015. Physical Exam Gen: no distress, normal appearing HEENT: oral mucosa pink and moist, NCAT Cardio: Reg rate Chest: normal effort, normal rate of breathing Abd: soft, non-distended Ext: no edema Psych: pleasant, normal affect Skin:    Comments: Wound VAC in place to left buttock abscess.  Neurological:     Comments: Patient is alert.  No acute distress.  Oriented x3. Sit to stand and transfer from stretcher to bed with hand held assist  Results for orders placed or performed during the hospital encounter of 01/03/21 (from the past 48 hour(s))  Glucose, capillary     Status: Abnormal   Collection Time: 01/08/21  4:39 PM  Result Value Ref Range   Glucose-Capillary 158 (H) 70 - 99 mg/dL    Comment: Glucose reference range applies only to samples taken after fasting for at least 8 hours.  Glucose, capillary     Status: Abnormal   Collection Time: 01/08/21  8:57 PM  Result Value Ref Range   Glucose-Capillary 125 (H) 70 - 99 mg/dL    Comment: Glucose reference range applies only to samples taken after fasting for at least 8 hours.  Renal function panel     Status: Abnormal   Collection Time: 01/09/21   4:25 AM  Result Value Ref Range   Sodium 143 135 - 145 mmol/L   Potassium 3.7 3.5 - 5.1 mmol/L   Chloride 106 98 - 111 mmol/L   CO2 28 22 - 32 mmol/L   Glucose, Bld 142 (H) 70 - 99 mg/dL    Comment: Glucose reference range applies only to samples taken after fasting for at least 8 hours.   BUN 34 (H) 6 - 20 mg/dL   Creatinine, Ser 7.37 (H) 0.44 - 1.00 mg/dL   Calcium 8.3 (L) 8.9 - 10.3 mg/dL   Phosphorus 3.1 2.5 - 4.6 mg/dL   Albumin 2.1 (L) 3.5 - 5.0 g/dL   GFR, Estimated 15 (L) >60 mL/min    Comment: (NOTE) Calculated using the CKD-EPI Creatinine Equation (2021)    Anion gap 9 5 - 15    Comment: Performed at [facility], [address] [place], [place] [address]  CBC     Status: Abnormal  Collection Time: 01/09/21  4:25 AM  Result Value Ref Range   WBC 13.3 (H) 4.0 - 10.5 K/uL   RBC 2.63 (L) 3.87 - 5.11 MIL/uL   Hemoglobin 8.1 (L) 12.0 - 15.0 g/dL   HCT 02.7 (L) 74.1 - 28.7 %   MCV 101.5 (H) 80.0 - 100.0 fL   MCH 30.8 26.0 - 34.0 pg   MCHC 30.3 30.0 - 36.0 g/dL   RDW 86.7 67.2 - 09.4 %   Platelets 402 (H) 150 - 400 K/uL   nRBC 0.0 0.0 - 0.2 %    Comment: Performed at [facility], [address] [place], [place] [address]  Glucose, capillary     Status: Abnormal   Collection Time: 01/09/21  7:52 AM  Result Value Ref Range   Glucose-Capillary 117 (H) 70 - 99 mg/dL    Comment: Glucose reference range applies only to samples taken after fasting for at least 8 hours.   Comment 1 Notify RN    Comment 2 Document in Chart   Glucose, capillary     Status: Abnormal   Collection Time: 01/09/21 12:16 PM  Result Value Ref Range   Glucose-Capillary 149 (H) 70 - 99 mg/dL    Comment: Glucose reference range applies only to samples taken after fasting for at least 8 hours.   Comment 1 Notify RN    Comment 2 Document in Chart   Glucose, capillary     Status: Abnormal   Collection Time: 01/09/21  4:18 PM  Result Value Ref Range    Glucose-Capillary 184 (H) 70 - 99 mg/dL    Comment: Glucose reference range applies only to samples taken after fasting for at least 8 hours.  Glucose, capillary     Status: Abnormal   Collection Time: 01/09/21  8:46 PM  Result Value Ref Range   Glucose-Capillary 187 (H) 70 - 99 mg/dL    Comment: Glucose reference range applies only to samples taken after fasting for at least 8 hours.  Renal function panel     Status: Abnormal   Collection Time: 01/10/21  4:49 AM  Result Value Ref Range   Sodium 140 135 - 145 mmol/L   Potassium 3.6 3.5 - 5.1 mmol/L   Chloride 105 98 - 111 mmol/L   CO2 26 22 - 32 mmol/L   Glucose, Bld 136 (H) 70 - 99 mg/dL    Comment: Glucose reference range applies only to samples taken after fasting for at least 8 hours.   BUN 29 (H) 6 - 20 mg/dL   Creatinine, Ser 8.36 (H) 0.44 - 1.00 mg/dL   Calcium 8.4 (L) 8.9 - 10.3 mg/dL   Phosphorus 3.5 2.5 - 4.6 mg/dL   Albumin 2.4 (L) 3.5 - 5.0 g/dL   GFR, Estimated 16 (L) >60 mL/min    Comment: (NOTE) Calculated using the CKD-EPI Creatinine Equation (2021)    Anion gap 9 5 - 15    Comment: Performed at [facility], [address] [place], [place] [address]  Glucose, capillary     Status: Abnormal   Collection Time: 01/10/21  7:30 AM  Result Value Ref Range   Glucose-Capillary 141 (H) 70 - 99 mg/dL    Comment: Glucose reference range applies only to samples taken after fasting for at least 8 hours.  Glucose, capillary     Status: Abnormal   Collection Time: 01/10/21 11:27 AM  Result Value Ref Range   Glucose-Capillary 151 (H) 70 - 99 mg/dL  Comment: Glucose reference range applies only to samples taken after fasting for at least 8 hours.   No results found.     Medical Problem List and Plan: 1.  Severe sepsis/septic shock secondary to necrotizing left gluteal wound.  Status post I&D wound debridement 01/04/2021 per Dr. Daphine Deutscher.  Follow-up general surgery.  Wound VAC changes as  directed  -patient may not shower while wound vac in place  -ELOS/Goals: 2-3 weeks modI 2.  Impaired mobility, not ambulating: Continue Lovenox  -antiplatelet therapy: N/A 3. Post-surgical pain: continue Oxycodone 5 to 10 mg every 4 hours as needed as well as 15 mg Monday Wednesday Friday with wound VAC changes as needed and Robaxin as needed- using regularly.  Check vitamin D level tomorrow.  4. Mood: Provide emotional support  -antipsychotic agents: N/A 5. Neuropsych: This patient is capable of making decisions on her own behalf. 6. Skin/Wound Care: Routine skin checks 7. Fluids/Electrolytes/Nutrition: Routine in and outs with follow-up chemistries 8.  ID.  Intravenous Zosyn 01/03/2021 and transition to Augmentin 01/10/2021. 9.  Diabetes mellitus.  Hemoglobin A1c 12.3.  Lantus insulin 45 units daily.  Check blood sugars before meals and at bedtime. CBGs currently 151-187.  10.  AKI/multifactorial.  Follow-up nephrology services.  Renal ultrasound unremarkable. 11.  Obesity.  BMI 60.14.  Dietary follow-up -check magnesium level tomorrow  I have personally performed a face to face diagnostic evaluation, including, but not limited to relevant history and physical exam findings, of this patient and developed relevant assessment and plan.  Additionally, I have reviewed and concur with the physician assistant's documentation above.  Horton Chin, MD 01/10/2021   Mcarthur Rossetti Angiulli, PA-C

## 2021-01-11 DIAGNOSIS — R5381 Other malaise: Principal | ICD-10-CM

## 2021-01-11 LAB — COMPREHENSIVE METABOLIC PANEL
ALT: 17 U/L (ref 0–44)
AST: 26 U/L (ref 15–41)
Albumin: 2 g/dL — ABNORMAL LOW (ref 3.5–5.0)
Alkaline Phosphatase: 111 U/L (ref 38–126)
Anion gap: 7 (ref 5–15)
BUN: 24 mg/dL — ABNORMAL HIGH (ref 6–20)
CO2: 26 mmol/L (ref 22–32)
Calcium: 8.6 mg/dL — ABNORMAL LOW (ref 8.9–10.3)
Chloride: 106 mmol/L (ref 98–111)
Creatinine, Ser: 3.28 mg/dL — ABNORMAL HIGH (ref 0.44–1.00)
GFR, Estimated: 16 mL/min — ABNORMAL LOW (ref >60)
Glucose, Bld: 150 mg/dL — ABNORMAL HIGH (ref 70–99)
Potassium: 3.8 mmol/L (ref 3.5–5.1)
Sodium: 139 mmol/L (ref 135–145)
Total Bilirubin: 0.9 mg/dL (ref 0.3–1.2)
Total Protein: 6.7 g/dL (ref 6.5–8.1)

## 2021-01-11 LAB — CBC WITH DIFFERENTIAL/PLATELET
Abs Immature Granulocytes: 0.07 10*3/uL (ref 0.00–0.07)
Basophils Absolute: 0.1 10*3/uL (ref 0.0–0.1)
Basophils Relative: 1 %
Eosinophils Absolute: 0.5 10*3/uL (ref 0.0–0.5)
Eosinophils Relative: 4 %
HCT: 26.3 % — ABNORMAL LOW (ref 36.0–46.0)
Hemoglobin: 8.5 g/dL — ABNORMAL LOW (ref 12.0–15.0)
Immature Granulocytes: 1 %
Lymphocytes Relative: 19 %
Lymphs Abs: 2.2 10*3/uL (ref 0.7–4.0)
MCH: 31.8 pg (ref 26.0–34.0)
MCHC: 32.3 g/dL (ref 30.0–36.0)
MCV: 98.5 fL (ref 80.0–100.0)
Monocytes Absolute: 1.6 10*3/uL — ABNORMAL HIGH (ref 0.1–1.0)
Monocytes Relative: 13 %
Neutro Abs: 7.5 10*3/uL (ref 1.7–7.7)
Neutrophils Relative %: 62 %
Platelets: 436 10*3/uL — ABNORMAL HIGH (ref 150–400)
RBC: 2.67 MIL/uL — ABNORMAL LOW (ref 3.87–5.11)
RDW: 13.4 % (ref 11.5–15.5)
WBC: 11.9 10*3/uL — ABNORMAL HIGH (ref 4.0–10.5)
nRBC: 0 % (ref 0.0–0.2)

## 2021-01-11 LAB — GLUCOSE, CAPILLARY
Glucose-Capillary: 106 mg/dL — ABNORMAL HIGH (ref 70–99)
Glucose-Capillary: 149 mg/dL — ABNORMAL HIGH (ref 70–99)
Glucose-Capillary: 119 mg/dL — ABNORMAL HIGH (ref 70–99)
Glucose-Capillary: 123 mg/dL — ABNORMAL HIGH (ref 70–99)

## 2021-01-11 LAB — VITAMIN D 25 HYDROXY (VIT D DEFICIENCY, FRACTURES): Vit D, 25-Hydroxy: 26.79 ng/mL — ABNORMAL LOW (ref 30–100)

## 2021-01-11 LAB — MAGNESIUM: Magnesium: 1.6 mg/dL — ABNORMAL LOW (ref 1.7–2.4)

## 2021-01-11 MED ORDER — VITAMIN D (ERGOCALCIFEROL) 1.25 MG (50000 UNIT) PO CAPS
50000.0000 [IU] | ORAL_CAPSULE | ORAL | Status: DC
  Administered 2021-01-11 – 2021-01-18 (×2): 50000 [IU] via ORAL
  Filled 2021-01-11 (×2): qty 1

## 2021-01-11 NOTE — Evaluation (Signed)
Physical Therapy Assessment and Plan  Patient Details  Name: Erin Davila MRN: [id number] Date of Birth: 17-May-1961  PT Diagnosis: Abnormality of gait, Difficulty walking, Muscle weakness, and Pain in sacral wound Rehab Potential: Good ELOS: 7-10 days   Today's Date: 01/11/2021 PT Individual Time: 1100-1146 PT Individual Time Calculation (min): 46 min  and Today's Date: 01/11/2021 PT Missed Time: 14 Minutes (Nausea) Missed Time Reason:     Hospital Problem: Active Problems:   Sepsis [facility]   Past Medical History:  Past Medical History:  Diagnosis Date   Allergy    Diabetes mellitus without complication ([place])    Hyperlipidemia    Hypertension    Past Surgical History:  Past Surgical History:  Procedure Laterality Date   CESAREAN SECTION     INCISION AND DRAINAGE ABSCESS N/A 01/04/2021   Procedure: INCISION AND DRAINAGE LEFT BUTTOCK ABSCESS;  Surgeon: Johnathan Hausen, MD;  Location: [facility];  Service: General;  Laterality: N/A;    Assessment & Plan Clinical Impression: Patient is a 60 y.o. year old female with with history of insulin-dependent diabetes mellitus hypertension morbid obesity with BMI 60.14, CKD with baseline creatinine 2.0.  Presented 01/03/2021 after a direct admission from general surgery outpatient office for evaluation of necrotizing wound on the left buttock.  Patient recently had incision and drainage for left gluteal abscess was taking home antibiotics.  On presentation to the office of general surgery wound was found to be necrotic with persistent drainage and therefore she was sent for direct admission.  Admission chemistry sodium 129, glucose 685, BUN 74, creatinine 4.47, WBC 15,500, hemoglobin A1c 12.8, SARS coronavirus negative.  Renal services consulted for AKI with baseline creatinine 2.0.  Renal ultrasound showed no obstruction.  A Foley catheter tube was placed.  Maintained on gentle IV fluids with latest creatinine 3.17.  General surgery continues to follow  patient and she did undergo sharp debridement incision and drainage 01/04/2021 per Dr. Hassell Done.  Wound VAC was placed changed every Monday Wednesday Friday.  Patient remains on Zosyn for wound coverage.  Subcutaneous Lovenox for DVT prophylaxis.   Patient currently requires supervision with mobility secondary to muscle weakness and pain from sacral wound .  Prior to hospitalization, patient was independent  with mobility and lived with Alone in a House home.  Home access is 3Stairs to enter.  Patient will benefit from skilled PT intervention to maximize safe functional mobility, minimize fall risk, and decrease caregiver burden for planned discharge home alone.  Anticipate patient will benefit from follow up [place] at discharge.  PT - End of Session Activity Tolerance: Tolerates 30+ min activity with multiple rests Endurance Deficit: Yes Endurance Deficit Description: Requires rest breaks PT Assessment Rehab Potential (ACUTE/IP ONLY): Good PT Barriers to Discharge: Decreased caregiver support;Inaccessible home environment;Wound Care;Home environment access/layout;Weight PT Barriers to Discharge Comments: Pt lives with son who is leaving for college. Limited caregiver support. PT Patient demonstrates impairments in the following area(s): Balance;Endurance;Motor;Pain;Skin Integrity PT Transfers Functional Problem(s): Bed Mobility;Bed to Chair;Car;Furniture PT Locomotion Functional Problem(s): Ambulation;Wheelchair Mobility;Stairs PT Plan PT Intensity: Minimum of 1-2 x/day ,45 to 90 minutes PT Frequency: 5 out of 7 days PT Duration Estimated Length of Stay: 7-10 days PT Treatment/Interventions: Ambulation/gait training;Community reintegration;DME/adaptive equipment instruction;Neuromuscular re-education;Stair training;UE/LE Strength taining/ROM;Wheelchair propulsion/positioning;Balance/vestibular training;Discharge planning;Pain management;Skin care/wound management;Therapeutic Activities;UE/LE  Coordination activities;Disease management/prevention;Functional mobility training;Patient/family education;Therapeutic Exercise PT Transfers Anticipated Outcome(s): Mod I PT Locomotion Anticipated Outcome(s): Mod I PT Recommendation Follow Up Recommendations: Home health PT (Wound care) Patient destination: Home Equipment  Recommended: To be determined   PT Evaluation Precautions/Restrictions Precautions Precautions: None Precaution Comments: wound vac in place on L buttocks Restrictions Weight Bearing Restrictions: No General PT Amount of Missed Time (min): 14 Minutes (Nausea) Vital Signs  Home Living/Prior Functioning Home Living Living Arrangements: Alone (son there until first of August) Available Help at Discharge: Family;Available PRN/intermittently Type of Home: House Home Access: Stairs to enter [employer] of Steps: 3 Entrance Stairs-Rails: Left Home Layout: One level Bathroom Shower/Tub: Chiropodist: Handicapped height Bathroom Accessibility: Yes Additional Comments: Used a cane to walk into work due to bursitis in hips  Lives With: Alone Prior Function Level of Independence: Independent with basic ADLs;Independent with homemaking with ambulation;Independent with gait  Able to Take Stairs?: Yes Driving: Yes Vocation: Full time employment Leisure: Hobbies-yes (Comment) Comments: Investment banker, corporate at [employer], works full time, very Hydrologist Status: Within Functional Limits for tasks assessed Arousal/Alertness: Awake/alert Orientation Level: Oriented X4 Attention: Alternating;Selective Selective Attention: Appears intact Memory: Appears intact Awareness: Appears intact Problem Solving: Appears intact Safety/Judgment: Appears intact Sensation Sensation Light Touch: Appears Intact Hot/Cold: Appears Intact Proprioception: Appears Intact Coordination Finger Nose Finger Test: [facility] Heel Shin Test:  [facility] Motor  Motor Motor: Within Functional Limits Motor - Skilled Clinical Observations: Generalized weakness 2/2 global deconditioning from wound   Trunk/Postural Assessment  Cervical Assessment Cervical Assessment: Exceptions to [facility] (Forward head) Thoracic Assessment Thoracic Assessment: Exceptions to [facility] (Kyphotic) Lumbar Assessment Lumbar Assessment:  (Posterior pelvic tilt) Postural Control Postural Control: Within Functional Limits  Balance Balance Balance Assessed: Yes Static Sitting Balance Static Sitting - Balance Support: Feet supported;Bilateral upper extremity supported Static Sitting - Level of Assistance: 7: Independent Dynamic Sitting Balance Dynamic Sitting - Balance Support: Bilateral upper extremity supported;Feet supported Dynamic Sitting - Level of Assistance: 7: Independent Static Standing Balance Static Standing - Balance Support: Bilateral upper extremity supported Static Standing - Level of Assistance: 5: Stand by assistance Dynamic Standing Balance Dynamic Standing - Balance Support: Bilateral upper extremity supported Dynamic Standing - Level of Assistance: 5: Stand by assistance Extremity Assessment      RLE Assessment RLE Assessment: Exceptions to [facility] RLE Strength Right Hip Flexion:  (Unable to assess 2/2 pain) Right Knee Flexion: 4+/5 Right Knee Extension: 4+/5 Right Ankle Dorsiflexion: 4+/5 Right Ankle Plantar Flexion: 4+/5 LLE Assessment LLE Assessment: Exceptions to [facility] LLE Strength Left Hip Flexion:  (Unable to assess 2/2 pain) Left Knee Flexion: 4+/5 Left Knee Extension: 4+/5 Left Ankle Dorsiflexion: 4+/5 Left Ankle Plantar Flexion: 4+/5  Care Tool Care Tool Bed Mobility Roll left and right activity   Roll left and right assist level: Supervision/Verbal cueing    Sit to lying activity   Sit to lying assist level: Supervision/Verbal cueing    Lying to sitting edge of bed activity   Lying to sitting edge of bed assist level:  Supervision/Verbal cueing     Care Tool Transfers Sit to stand transfer   Sit to stand assist level: Contact Guard/Touching assist    Chair/bed transfer Chair/bed transfer activity did not occur: Safety/medical concerns (No bariatric WC available. Nausea)       Toilet transfer   Assist Level: Contact Guard/Touching assist (BSC)    Car transfer Car transfer activity did not occur: Safety/medical concerns (Nausea)        Care Tool Locomotion Ambulation Ambulation activity did not occur: Safety/medical concerns (Nausea)        Walk 10 feet activity Walk 10 feet activity did not occur:  Safety/medical concerns (Nausea)       Walk 50 feet with 2 turns activity Walk 50 feet with 2 turns activity did not occur: Safety/medical concerns (Nausea)      Walk 150 feet activity Walk 150 feet activity did not occur: Safety/medical concerns (Nausea)      Walk 10 feet on uneven surfaces activity Walk 10 feet on uneven surfaces activity did not occur: Safety/medical concerns (Nausea)      Stairs Stair activity did not occur: Safety/medical concerns (Nausea)        Walk up/down 1 step activity Walk up/down 1 step or curb (drop down) activity did not occur: Safety/medical concerns (Nausea)     Walk up/down 4 steps activity did not occuR: Safety/medical concerns (Nausea)  Walk up/down 4 steps activity      Walk up/down 12 steps activity Walk up/down 12 steps activity did not occur: Safety/medical concerns (Nausea)      Pick up small objects from floor Pick up small object from the floor (from standing position) activity did not occur: Safety/medical concerns (Nausea)      Wheelchair Will patient use wheelchair at discharge?: Yes Type of Wheelchair: Manual Wheelchair activity did not occur: Safety/medical concerns (No bariatric chair available)      Wheel 50 feet with 2 turns activity Wheelchair 50 feet with 2 turns activity did not occur: Safety/medical concerns (No bariatric chair  available)    Wheel 150 feet activity Wheelchair 150 feet activity did not occur: Safety/medical concerns (No bariatric chair available)      Refer to Care Plan for Long Term Goals  SHORT TERM GOAL WEEK 1 PT Short Term Goal 1 (Week 1): STG = LTG due to LOS  Recommendations for other services: None   Skilled Therapeutic Intervention Evaluation completed (see details above and below) with education on PT POC and goals and individual treatment initiated with focus on bed mobility, transfers, gait, car transfers and WC mobility. Pt received sitting EOB with OT. Reported she felt nauseous, contacted nurse to obtain antiemetic. Attempted to find a WC for pt but no bariatric chairs available. Unable to transport pt to 4th floor to assess stairs or car transfers at this time. Pt is able to roll to L in bed with supervision and use of bedrail. She is also able to go from left sidelying <> sit w/ supervision. Pt performed sit <> stand with CGA and no AD and did stand pivot w/ CGA to sit on BSC. Overall, pt is supervision with bed mobility and CGA for transfers. Unable to assess gait 2/2 pt's nausea and lack of WC. Pt was left in L sidelying in bed with all needs met.   Mobility Bed Mobility Bed Mobility: Sit to Sidelying Left;Left Sidelying to Sit Left Sidelying to Sit: Supervision/Verbal cueing Sit to Sidelying Left: Supervision/Verbal cueing Transfers Transfers: Sit to Stand;Stand to Sit;Stand Pivot Transfers Sit to Stand: Contact Guard/Touching assist Stand to Sit: Contact Guard/Touching assist Stand Pivot Transfers: Contact Guard/Touching assist Transfer (Assistive device): None Locomotion  Gait Ambulation: No (Nausea) Gait Gait: No (Nausea) Stairs / Additional Locomotion Stairs: No (Nausea) Wheelchair Mobility Wheelchair Mobility: No (No bariatric WC available)   Discharge Criteria: Patient will be discharged from PT if patient refuses treatment 3 consecutive times without medical  reason, if treatment goals not met, if there is a change in medical status, if patient makes no progress towards goals or if patient is discharged from hospital.  The above assessment, treatment plan, treatment alternatives and goals  were discussed and mutually agreed upon: by patient  [place] E Brenin Heidelberger Cruzita Lederer Jaree Trinka, PT, DPT  01/11/2021, 12:06 PM

## 2021-01-11 NOTE — Progress Notes (Signed)
Inpatient Rehabilitation Center Individual Statement of Services  Patient Name:  Erin Davila  Date:  01/11/2021  Welcome to the Inpatient Rehabilitation Center.  Our goal is to provide you with an individualized program based on your diagnosis and situation, designed to meet your specific needs.  With this comprehensive rehabilitation program, you will be expected to participate in at least 3 hours of rehabilitation therapies Monday-Friday, with modified therapy programming on the weekends.  Your rehabilitation program will include the following services:  Physical Therapy (PT), Occupational Therapy (OT), 24 hour per day rehabilitation nursing, Therapeutic Recreaction (TR), Care Coordinator, Rehabilitation Medicine, Nutrition Services, and Pharmacy Services  Weekly team conferences will be held on Tuesday to discuss your progress.  Your Inpatient Rehabilitation Care Coordinator will talk with you frequently to get your input and to update you on team discussions.  Team conferences with you and your family in attendance may also be held.  Expected length of stay: 7-10 days  Overall anticipated outcome: mod/I level  Depending on your progress and recovery, your program may change. Your Inpatient Rehabilitation Care Coordinator will coordinate services and will keep you informed of any changes. Your Inpatient Rehabilitation Care Coordinator's name and contact numbers are listed  below.  The following services may also be recommended but are not provided by the Inpatient Rehabilitation Center:  Driving Evaluations Home Health Rehabiltiation Services Outpatient Rehabilitation Services Vocational Rehabilitation   Arrangements will be made to provide these services after discharge if needed.  Arrangements include referral to agencies that provide these services.  Your insurance has been verified to be:  Community education officer Your primary doctor is:  [name]  Pertinent information will be shared with your  doctor and your insurance company.  Inpatient Rehabilitation Care Coordinator:  Dossie Der, [place] [phone] or Luna [place]  Information discussed with and copy given to patient by: Lucy Chris, 01/11/2021, 10:06 AM

## 2021-01-11 NOTE — Evaluation (Signed)
Occupational Therapy Assessment and Plan  Patient Details  Name: Erin Davila MRN: [id number] Date of Birth: Mar 22, 1961  OT Diagnosis: acute pain and muscle weakness (generalized) Rehab Potential: Rehab Potential (ACUTE ONLY): Good ELOS: 7-10 days   Today's Date: 01/11/2021 OT Individual Time: 1000-1100 OT Individual Time Calculation (min): 60 min     Hospital Problem: Active Problems:   Sepsis [facility])   Past Medical History:  Past Medical History:  Diagnosis Date   Allergy    Diabetes mellitus without complication ([place])    Hyperlipidemia    Hypertension    Past Surgical History:  Past Surgical History:  Procedure Laterality Date   CESAREAN SECTION     INCISION AND DRAINAGE ABSCESS N/A 01/04/2021   Procedure: INCISION AND DRAINAGE LEFT BUTTOCK ABSCESS;  Surgeon: Johnathan Hausen, MD;  Location: [facility];  Service: General;  Laterality: N/A;    Assessment & Plan Clinical Impression: Patient is a 60 y.o. right-handed female with history of insulin-dependent diabetes mellitus hypertension morbid obesity with BMI 60.14, CKD with baseline creatinine 2.0.  Per chart review patient lives alone.  1 level home 2 steps to entry.  Reportedly independent and active prior to admission.  She has a son home from college for the summer.  Presented 01/03/2021 after a direct admission from general surgery outpatient office for evaluation of necrotizing wound on the left buttock.  Patient recently had incision and drainage for left gluteal abscess was taking home antibiotics.  On presentation to the office of general surgery wound was found to be necrotic with persistent drainage and therefore she was sent for direct admission.  Admission chemistry sodium 129, glucose 685, BUN 74, creatinine 4.47, WBC 15,500, hemoglobin A1c 12.8, SARS coronavirus negative.  Renal services consulted for AKI with baseline creatinine 2.0.  Renal ultrasound showed no obstruction.  A Foley catheter tube was placed.  Maintained on  gentle IV fluids with latest creatinine 3.17.  General surgery continues to follow patient and she did undergo sharp debridement incision and drainage 01/04/2021 per Dr. Hassell Done.  Wound VAC was placed changed every Monday Wednesday Friday.  Patient placed on  Zosyn 01/03/2021 for wound coverage transitioned to Augmentin 01/10/2021.  Subcutaneous Lovenox for DVT prophylaxis.  Therapy evaluations completed due to patient decreased functional mobility was admitted for a comprehensive rehab program.  Patient transferred to CIR on 01/10/2021 .    Patient currently requires min with basic self-care skills secondary to muscle weakness, decreased cardiorespiratoy endurance, and decreased standing balance and decreased balance strategies.  Prior to hospitalization, patient could complete ADLs and IADLs with independent .  Patient will benefit from skilled intervention to increase independence with basic self-care skills and increase level of independence with iADL prior to discharge  home with intermittent supervision/assist from son until he returns to college .  Anticipate patient will require intermittent supervision and follow up home health vs no follow up.  OT - End of Session Activity Tolerance: Tolerates 30+ min activity with multiple rests Endurance Deficit: Yes Endurance Deficit Description: Requires rest breaks, reports nausea at end of session OT Assessment Rehab Potential (ACUTE ONLY): Good OT Barriers to Discharge: Wound Care OT Barriers to Discharge Comments: wound vac OT Patient demonstrates impairments in the following area(s): Balance;Endurance;Motor;Pain;Safety OT Basic ADL's Functional Problem(s): Bathing;Dressing;Toileting OT Advanced ADL's Functional Problem(s): Simple Meal Preparation;Light Housekeeping OT Transfers Functional Problem(s): Toilet OT Additional Impairment(s): None OT Plan OT Intensity: Minimum of 1-2 x/day, 45 to 90 minutes OT Frequency: 5 out of 7 days OT  Duration/Estimated Length of Stay: 7-10 days OT Treatment/Interventions: Medical illustrator training;Community reintegration;Discharge planning;Disease mangement/prevention;DME/adaptive equipment instruction;Functional mobility training;Pain management;Patient/family education;Psychosocial support;Self Care/advanced ADL retraining;Skin care/wound managment;Therapeutic Activities;Therapeutic Exercise OT Basic Self-Care Anticipated Outcome(s): Mod I - I OT Toileting Anticipated Outcome(s): Mod I OT Bathroom Transfers Anticipated Outcome(s): Mod I OT Recommendation Patient destination: Home Follow Up Recommendations: Home health OT (vs no f/u) Equipment Recommended: 3 in 1 bedside comode   OT Evaluation Precautions/Restrictions  Precautions Precautions: None Precaution Comments: wound vac in place on L buttocks Restrictions Weight Bearing Restrictions: No Pain Pain Assessment Pain Scale: 0-10 Pain Score: 3  Pain Type: Acute pain Pain Location: Buttocks Pain Orientation: Mid Pain Descriptors / Indicators: Aching Pain Frequency: Intermittent Pain Intervention(s): Medication (See eMAR) Home Living/Prior Functioning Home Living Family/patient expects to be discharged to:: Private residence Living Arrangements: Alone (son there until first of August) Available Help at Discharge: Family, Available PRN/intermittently Type of Home: House Home Access: Stairs to enter Technical brewer of Steps: 3 Entrance Stairs-Rails: Left Home Layout: One level Bathroom Shower/Tub: Tub/shower unit (takes a bath) Biochemist, clinical: Handicapped height Bathroom Accessibility: Yes Additional Comments: reports using a cane at work sometimes due to bursitis in B hips  Lives With: Alone (son home from college for the summer) IADL History Homemaking Responsibilities: Yes Meal Prep Responsibility: Primary Laundry Responsibility: Primary Cleaning Responsibility: Primary Bill Paying/Finance  Responsibility: Primary Shopping Responsibility: Primary Current License: Yes Mode of Transportation: Car Occupation: Full time employment Type of Occupation: Merchandiser, retail of Environmental education officer Prior Function Level of Independence: Independent with basic ADLs, Independent with homemaking with ambulation, Independent with gait (occasional use of cane at work due to increased walking)  Able to [employer]?: Yes Driving: Yes Vocation: Full time employment Comments: leads the choir, very active and independent, drives and works Vision Baseline Vision/History: No visual deficits Patient Visual Report: No change from baseline Vision Assessment?: No apparent visual deficits Perception  Perception: Within Functional Limits Praxis Praxis: Intact Cognition Overall Cognitive Status: Within Functional Limits for tasks assessed Arousal/Alertness: Awake/alert Orientation Level: Person;Place;Situation Person: Oriented Place: Oriented Situation: Oriented Year: 2022 Month: July Day of Week: Correct Memory: Appears intact Immediate Memory Recall: Sock;Blue;Bed Memory Recall Sock: With Cue Memory Recall Blue: Without Cue Memory Recall Bed: With Cue Attention: Alternating;Selective Selective Attention: Appears intact Awareness: Appears intact Problem Solving: Appears intact Safety/Judgment: Appears intact Sensation Sensation Light Touch: Appears Intact Hot/Cold: Appears Intact Proprioception: Appears Intact Coordination Finger Nose Finger Test: [facility] Heel Shin Test: [facility] Motor  Motor Motor: Within Functional Limits Motor - Skilled Clinical Observations: Generalized weakness 2/2 global deconditioning from wound  Trunk/Postural Assessment  Cervical Assessment Cervical Assessment: Exceptions to [facility] (Forward head) Thoracic Assessment Thoracic Assessment: Exceptions to [facility] (Kyphotic) Lumbar Assessment Lumbar Assessment:  (Posterior pelvic tilt) Postural Control Postural Control: Within  Functional Limits  Balance Balance Balance Assessed: Yes Static Sitting Balance Static Sitting - Balance Support: Feet supported;Bilateral upper extremity supported Static Sitting - Level of Assistance: 7: Independent Dynamic Sitting Balance Dynamic Sitting - Balance Support: Bilateral upper extremity supported;Feet supported Dynamic Sitting - Level of Assistance: 7: Independent Static Standing Balance Static Standing - Balance Support: Bilateral upper extremity supported Static Standing - Level of Assistance: 5: Stand by assistance Dynamic Standing Balance Dynamic Standing - Balance Support: Bilateral upper extremity supported Dynamic Standing - Level of Assistance: 5: Stand by assistance Extremity/Trunk Assessment RUE Assessment RUE Assessment: Within Functional Limits LUE Assessment LUE Assessment: Within Functional Limits  Care Tool Care Tool Self Care Eating  Oral Care         Bathing   Body parts bathed by patient: Right arm;Left arm;Chest;Abdomen;Front perineal area;Right upper leg;Left upper leg;Face Body parts bathed by helper: Buttocks   Assist Level: Minimal Assistance - Patient > 75%    Upper Body Dressing(including orthotics)   What is the patient wearing?: Bra;Pull over shirt   Assist Level: Set up assist    Lower Body Dressing (excluding footwear)   What is the patient wearing?: Pants Assist for lower body dressing: Minimal Assistance - Patient > 75%    Putting on/Taking off footwear   What is the patient wearing?: Shoes Assist for footwear: Set up assist       Care Tool Toileting Toileting activity   Assist for toileting: Moderate Assistance - Patient 50 - 74%     Care Tool Bed Mobility Roll left and right activity   Roll left and right assist level: Supervision/Verbal cueing    Sit to lying activity   Sit to lying assist level: Supervision/Verbal cueing    Lying to sitting edge of bed activity   Lying to sitting edge of bed assist  level: Supervision/Verbal cueing     Care Tool Transfers Sit to stand transfer   Sit to stand assist level: Contact Guard/Touching assist    Chair/bed transfer Chair/bed transfer activity did not occur: Safety/medical concerns (No bariatric WC available. Nausea)       Toilet transfer   Assist Level: Contact Guard/Touching assist [facility])     Care Tool Cognition Expression of Ideas and Wants     Understanding Verbal and Non-Verbal Content     Memory/Recall Ability *first 3 days only      Refer to Care Plan for Long Term Goals  SHORT TERM GOAL WEEK 1 OT Short Term Goal 1 (Week 1): STG = LTGs due to ELOS  Recommendations for other services: Therapeutic Recreation  Pet therapy, Kitchen group, and Outing/community reintegration   Skilled Therapeutic Intervention OT eval completed with discussion of rehab process, OT purpose, POC, ELOS, and goals.  Pt received in sidelying in bed with 2 sisters present throughout session.  Pt agreeable to engaging in self-care tasks of bathing, dressing, and toileting.  ADL assessment completed at EOB with focus on functional mobility, sitting balance, sit > stand, standing balance, and stand pivot transfers.  P able to complete bathing and dressing at EOB with lateral leans to complete majority of bathing, did require min assist to wash buttocks while pt maintained standing with UE support on bed rail.  Pt completed stand pivot transfer bed > wide BSC with CGA.  Pt voided bowel and bladder, reporting "cramping" and "burning" sensation with urination.  Therapist completed hygiene post BM for thoroughness due to body habitus and placement of wound vac dressing.  Pt returned to EOB with CGA.  Pt reports nausea at end of session, RN notified.   ADL ADL Upper Body Bathing: Setup Where Assessed-Upper Body Bathing: Edge of bed Lower Body Bathing: Minimal assistance Where Assessed-Lower Body Bathing: Edge of bed Upper Body Dressing: Setup Where Assessed-Upper  Body Dressing: Edge of bed Lower Body Dressing: Minimal assistance Where Assessed-Lower Body Dressing: Edge of bed Toileting: Moderate assistance Where Assessed-Toileting: Bedside Commode Toilet Transfer: Contact guard Toilet Transfer Method: Stand pivot Toilet Transfer Equipment: Extra wide drop arm bedside commode Mobility  Bed Mobility Bed Mobility: Sit to Sidelying Left;Left Sidelying to Sit Left Sidelying to Sit: Supervision/Verbal cueing Sit to Sidelying Left: Supervision/Verbal cueing Transfers Sit to Stand:  Contact Guard/Touching assist Stand to Sit: Contact Guard/Touching assist   Discharge Criteria: Patient will be discharged from OT if patient refuses treatment 3 consecutive times without medical reason, if treatment goals not met, if there is a change in medical status, if patient makes no progress towards goals or if patient is discharged from hospital.  The above assessment, treatment plan, treatment alternatives and goals were discussed and mutually agreed upon: by patient and by family  Ellwood Dense [facility] 01/11/2021, 12:15 PM

## 2021-01-11 NOTE — Progress Notes (Signed)
Inpatient Rehabilitation  Patient information reviewed and entered into eRehab system by Miriana Gaertner M. Dandy Lazaro, M.A., CCC/SLP, PPS Coordinator.  Information including medical coding, functional ability and quality indicators will be reviewed and updated through discharge.    

## 2021-01-11 NOTE — Progress Notes (Signed)
Physical Therapy Session Note  Patient Details  Name: Erin Davila MRN: [id number] Date of Birth: December 22, 1960  Today's Date: 01/11/2021 PT Individual Time: 1400-1455 PT Individual Time Calculation (min): 55 min   Short Term Goals: Week 1:  PT Short Term Goal 1 (Week 1): STG = LTG due to LOS  Skilled Therapeutic Interventions/Progress Updates:   Received pt supine in bed asleep, upon wakening pt agreeable to PT treatment, and reported pain 6/10 in RLE and buttocks. RN notifed and present to adminster medications and repositioning, rest breaks, and distraction done to reduce pain levels. Session with emphasis on functional mobility/transfers, generalized strengthening, dynamic standing balance/coordination, gait training, and improved activity tolerance. Supine<>sitting EOB with supervision and increased time and donned bra with +2 assist due to body habitus and socks with supervision and increased time. Donned pants in sitting with mod A and shoes with max A. Sit<>stand without AD and CGA and required max A to pull pants over hips. Pt ambulated 70ft x 2 and 6ft x 1 without AD and min handheld assist (and occasionally using rail or other stable furniture on R side) with +2 for recliner follow as no bariatric WC available today. Pt demonstrated wide BOS, decreased stride length and foot clearance, and decreased arm swing with mild unsteadiness. Of note, pt extremely fearful of falling and became tearful and emotional during session. NT present to assess vitals during seated rest break. Attempted to find bariatric RW however one not available. Returned to room and pt reported urge to urinate and transferred recliner<>bedside commode without AD and CGA -max A to doff pants. Pt unable to void but requested to sit there a little longer; therefore notified NT due to time constraints. Concluded session with pt sitting on bedside commode with NT present attending to care.   Therapy Documentation Precautions:   Restrictions Weight Bearing Restrictions: No  Therapy/Group: Individual Therapy Martin Majestic PT, DPT   01/11/2021, 7:44 AM

## 2021-01-11 NOTE — Progress Notes (Signed)
Inpatient Rehabilitation Care Coordinator Assessment and Plan Patient Details  Name: Erin Davila MRN: [id number] Date of Birth: 1961-02-19  Today's Date: 01/11/2021  Hospital Problems: Active Problems:   Sepsis [facility])  Past Medical History:  Past Medical History:  Diagnosis Date   Allergy    Diabetes mellitus without complication ([facility])    Hyperlipidemia    Hypertension    Past Surgical History:  Past Surgical History:  Procedure Laterality Date   CESAREAN SECTION     INCISION AND DRAINAGE ABSCESS N/A 01/04/2021   Procedure: INCISION AND DRAINAGE LEFT BUTTOCK ABSCESS;  Surgeon: Luretha Murphy, MD;  Location: [facility];  Service: General;  Laterality: N/A;   Social History:  reports that she has never smoked. She has never used smokeless tobacco. She reports that she does not drink alcohol and does not use drugs.  Family / Support Systems Marital Status: Single Patient Roles: Parent, Other (Comment) (sibling and employee) Children: son home from college until first of August Other Supports: Dawn-sister [phone] Anticipated Caregiver: Sister and son Ability/Limitations of Caregiver: Both work so will be times pt will be alone Caregiver Availability: Intermittent Family Dynamics: Close with sister and son who is home from college. Pt has friends and co-workers who are supportive also. Sister and son will be here daily to visit pt and provide emotional support  Social History Preferred language: English Religion: Christian Cultural Background: No issues Education: Some college Read: Yes Write: Yes Employment Status: Employed Name of Employer: [employer] Return to Work Plans: Plans to return to work when able Marine scientist Issues: No issues Guardian/Conservator: None-according to MD pt is capable of making her own decisions while here   Abuse/Neglect Abuse/Neglect Assessment Can Be Completed: Yes Physical Abuse: Denies Verbal Abuse:  Denies Sexual Abuse: Denies Exploitation of patient/patient's resources: Denies Self-Neglect: Denies  Emotional Status Pt's affect, behavior and adjustment status: Pt is motivated and feels if her bottom felt better she could do more in therapies. Pt has always been independent and taken care of herself and plans to again Recent Psychosocial Issues: other health issues Psychiatric History: No history deferred depression screen due to coping appropriately and able to verbalize her concerns and feelings. Has a very strong faith which helps her cope Substance Abuse History: No issues  Patient / Family Perceptions, Expectations & Goals Pt/Family understanding of illness & functional limitations: Pt and sister have a good understanding of hospitalization but keep asking how mcuh longer she will have the wound vac for. Depends upon the healing and encouraged her to ask WOC RN when changes M,W,F Premorbid pt/family roles/activities: Mom, employee, sibling, church member, etc Anticipated changes in roles/activities/participation: resume Pt/family expectations/goals: Pt states: " I want to do for myself I always have." SIster states: " I will help but we work and can't be there all of the time."  [employer]: None Premorbid Home Care/DME Agencies: None Transportation available at discharge: Self and family  Discharge Planning Living Arrangements: Alone (son there until first of August) Support Systems: Children, Other relatives, Manufacturing engineer, Psychologist, clinical community Type of Residence: Private residence Civil engineer, contracting: Media planner (specify) Administrator) Financial Resources: Employment Surveyor, quantity Screen Referred: No Living Expenses: Psychologist, sport and exercise Management: Patient Does the patient have any problems obtaining your medications?: No Home Management: Self will rely upon son and sister until able to again Patient/Family Preliminary Plans: Return home with son who  is there for a short time from college-until August. Sister is local and can check on  but not stay with her she works. Will await therapy team evaluations and work on discharge needs. Care Coordinator Barriers to Discharge: Decreased caregiver support, Weight Care Coordinator Anticipated Follow Up Needs: HH/OP  Clinical Impression Pleasant motivated female who is ready for rehab and to get out of the uncomfortable bed. Her sister is here and supportive and will assist at home. Had questions regarding wound vac and how much longer-encouraged her to ask MD and WOC RN when changes M, W,F. Work on discharge needs. Her paperwork for work has been completed.  Lucy Chris 01/11/2021, 10:04 AM

## 2021-01-11 NOTE — Consult Note (Addendum)
WOC follow-up:  Pt states Vac was leaking yesterday and dressing was changed by the bedside nurse. Refer to nursing wound care flow sheet. Current dressing is intact with with good seal at cont suction. WOC team will plan to change the dressing again on Mon. Thank-you,  Cammie Mcgee MSN, RN, CWOCN, [place], CNS [phone]

## 2021-01-11 NOTE — Progress Notes (Signed)
PROGRESS NOTE   Subjective/Complaints: Sleepy this morning WBC trending down Hgb improving Cr increasing  ROS: +pain at abscess site   Objective:   No results found. Recent Labs    01/09/21 0425 01/11/21 0740  WBC 13.3* 11.9*  HGB 8.1* 8.5*  HCT 26.7* 26.3*  PLT 402* 436*   Recent Labs    01/10/21 0449 01/11/21 0740  NA 140 139  K 3.6 3.8  CL 105 106  CO2 26 26  GLUCOSE 136* 150*  BUN 29* 24*  CREATININE 3.17* 3.28*  CALCIUM 8.4* 8.6*    Intake/Output Summary (Last 24 hours) at 01/11/2021 1319 Last data filed at 01/11/2021 0700 Gross per 24 hour  Intake 477 ml  Output 125 ml  Net 352 ml        Physical Exam: Vital Signs Blood pressure (!) 119/47, pulse 85, temperature 98.8 F (37.1 C), temperature source Oral, resp. rate 18, height 5\' 8"  (1.727 m), weight (!) 170.7 kg, last menstrual period 03/26/2015, SpO2 96 %. Gen: no distress, normal appearing HEENT: oral mucosa pink and moist, NCAT Cardio: Reg rate Chest: normal effort, normal rate of breathing Abd: soft, non-distended Ext: no edema Psych: pleasant, normal affect Skin:    Comments: Wound VAC in place to left buttock abscess.  Neurological:    Comments: Patient is alert.  No acute distress.  Oriented x3. Sit to stand and transfer from stretcher to bed with hand held assist   Assessment/Plan: 1. Functional deficits which require 3+ hours per day of interdisciplinary therapy in a comprehensive inpatient rehab setting. Physiatrist is providing close team supervision and 24 hour management of active medical problems listed below. Physiatrist and rehab team continue to assess barriers to discharge/monitor patient progress toward functional and medical goals  Care Tool:  Bathing    Body parts bathed by patient: Right arm, Left arm, Chest, Abdomen, Front perineal area, Right upper leg, Left upper leg, Face   Body parts bathed by helper:  Buttocks     Bathing assist Assist Level: Minimal Assistance - Patient > 75%     Upper Body Dressing/Undressing Upper body dressing   What is the patient wearing?: Bra, Pull over shirt    Upper body assist Assist Level: Set up assist    Lower Body Dressing/Undressing Lower body dressing      What is the patient wearing?: Pants     Lower body assist Assist for lower body dressing: Minimal Assistance - Patient > 75%     Toileting Toileting    Toileting assist Assist for toileting: Moderate Assistance - Patient 50 - 74%     Transfers Chair/bed transfer  Transfers assist  Chair/bed transfer activity did not occur: Safety/medical concerns (No bariatric WC available. Nausea)        Locomotion Ambulation   Ambulation assist   Ambulation activity did not occur: Safety/medical concerns (Nausea)          Walk 10 feet activity   Assist  Walk 10 feet activity did not occur: Safety/medical concerns (Nausea)        Walk 50 feet activity   Assist Walk 50 feet with 2 turns activity did not occur: Safety/medical concerns (  Nausea)         Walk 150 feet activity   Assist Walk 150 feet activity did not occur: Safety/medical concerns (Nausea)         Walk 10 feet on uneven surface  activity   Assist Walk 10 feet on uneven surfaces activity did not occur: Safety/medical concerns (Nausea)         Wheelchair     Assist Will patient use wheelchair at discharge?: Yes Type of Wheelchair: Manual Wheelchair activity did not occur: Safety/medical concerns (No bariatric chair available)         Wheelchair 50 feet with 2 turns activity    Assist    Wheelchair 50 feet with 2 turns activity did not occur: Safety/medical concerns (No bariatric chair available)       Wheelchair 150 feet activity     Assist  Wheelchair 150 feet activity did not occur: Safety/medical concerns (No bariatric chair available)       Blood pressure (!)  119/47, pulse 85, temperature 98.8 F (37.1 C), temperature source Oral, resp. rate 18, height 5\' 8"  (1.727 m), weight (!) 170.7 kg, last menstrual period 03/26/2015, SpO2 96 %.  Medical Problem List and Plan: 1.  Severe sepsis/septic shock secondary to necrotizing left gluteal wound.  Status post I&D wound debridement 01/04/2021 per Dr. 03/07/2021.  Follow-up general surgery.  Wound VAC changes as directed             -patient may not shower while wound vac in place             -ELOS/Goals: 2-3 weeks modI 2.  Impaired mobility, not ambulating: Continue Lovenox             -antiplatelet therapy: N/A 3. Post-surgical pain: continue Oxycodone 5 to 10 mg every 4 hours as needed as well as 15 mg Monday Wednesday Friday with wound VAC changes as needed and Robaxin as needed- using regularly. 4. Mood: Provide emotional support             -antipsychotic agents: N/A 5. Neuropsych: This patient is capable of making decisions on her own behalf. 6. Skin/Wound Care: Routine skin checks 7. Fluids/Electrolytes/Nutrition: Routine in and outs with follow-up chemistries 8.  ID.  Intravenous Zosyn 01/03/2021 and transition to Augmentin 01/10/2021. 9.  Diabetes mellitus.  Hemoglobin A1c 12.3.  Lantus insulin 45 units daily.  Check blood sugars before meals and at bedtime. CBGs currently 151-187. 10.  AKI/multifactorial.  Follow-up nephrology services.  Renal ultrasound unremarkable. 11.  Obesity.  BMI 60.14.  Dietary follow-up -check magnesium level tomorrow 12. Vitamin D deficiency: start ergocalciferol 50,00U weekly for 7 weeks 13. Magnesium deficiency: will hold off pn supplementation given low BP 14. Hypokalemia: will hold off on supplementation until blood pressure improves.     LOS: 1 days A FACE TO FACE EVALUATION WAS PERFORMED  Sui Kasparek P Lamisha Roussell 01/11/2021, 1:19 PM

## 2021-01-12 LAB — GLUCOSE, CAPILLARY
Glucose-Capillary: 84 mg/dL (ref 70–99)
Glucose-Capillary: 138 mg/dL — ABNORMAL HIGH (ref 70–99)
Glucose-Capillary: 166 mg/dL — ABNORMAL HIGH (ref 70–99)
Glucose-Capillary: 172 mg/dL — ABNORMAL HIGH (ref 70–99)

## 2021-01-12 LAB — URINALYSIS, ROUTINE W REFLEX MICROSCOPIC
Bilirubin Urine: NEGATIVE
Glucose, UA: 50 mg/dL — AB
Ketones, ur: 5 mg/dL — AB
Nitrite: NEGATIVE
Protein, ur: 100 mg/dL — AB
RBC / HPF: >50 RBC/hpf — ABNORMAL HIGH (ref 0–5)
Specific Gravity, Urine: 1.018 (ref 1.005–1.030)
Squamous Epithelial / HPF: >50 — ABNORMAL HIGH (ref 0–5)
pH: 5 (ref 5.0–8.0)

## 2021-01-12 LAB — BASIC METABOLIC PANEL
Anion gap: 8 (ref 5–15)
BUN: 22 mg/dL — ABNORMAL HIGH (ref 6–20)
CO2: 25 mmol/L (ref 22–32)
Calcium: 8.3 mg/dL — ABNORMAL LOW (ref 8.9–10.3)
Chloride: 107 mmol/L (ref 98–111)
Creatinine, Ser: 3.09 mg/dL — ABNORMAL HIGH (ref 0.44–1.00)
GFR, Estimated: 17 mL/min — ABNORMAL LOW (ref >60)
Glucose, Bld: 93 mg/dL (ref 70–99)
Potassium: 3.4 mmol/L — ABNORMAL LOW (ref 3.5–5.1)
Sodium: 140 mmol/L (ref 135–145)

## 2021-01-12 MED ORDER — INSULIN GLARGINE 100 UNIT/ML ~~LOC~~ SOLN
44.0000 [IU] | Freq: Every day | SUBCUTANEOUS | Status: DC
  Administered 2021-01-13 – 2021-01-21 (×9): 44 [IU] via SUBCUTANEOUS
  Filled 2021-01-12 (×10): qty 0.44

## 2021-01-12 MED ORDER — MAGNESIUM SULFATE 4 GM/100ML IV SOLN
4.0000 g | Freq: Once | INTRAVENOUS | Status: AC
  Administered 2021-01-12: 4 g via INTRAVENOUS
  Filled 2021-01-12: qty 100

## 2021-01-12 MED ORDER — POTASSIUM CHLORIDE 20 MEQ PO PACK
60.0000 meq | PACK | Freq: Once | ORAL | Status: AC
  Filled 2021-01-12: qty 3

## 2021-01-12 MED ORDER — POTASSIUM CHLORIDE 20 MEQ PO PACK
60.0000 meq | PACK | Freq: Once | ORAL | Status: AC
  Administered 2021-01-12: 60 meq via ORAL
  Filled 2021-01-12: qty 3

## 2021-01-12 NOTE — Progress Notes (Signed)
PROGRESS NOTE   Subjective/Complaints: Awake and alert.  Discussed K+ and Magnesium deficiencies- BP has increased so can supplement today Pain is much better controlled  ROS: +pain at abscess site- much better controlled   Objective:   No results found. Recent Labs    01/11/21 0740  WBC 11.9*  HGB 8.5*  HCT 26.3*  PLT 436*   Recent Labs    01/11/21 0740 01/12/21 0554  NA 139 140  K 3.8 3.4*  CL 106 107  CO2 26 25  GLUCOSE 150* 93  BUN 24* 22*  CREATININE 3.28* 3.09*  CALCIUM 8.6* 8.3*    Intake/Output Summary (Last 24 hours) at 01/12/2021 1816 Last data filed at 01/12/2021 1800 Gross per 24 hour  Intake 840 ml  Output 103 ml  Net 737 ml        Physical Exam: Vital Signs Blood pressure (!) 142/68, pulse 87, temperature 97.9 F (36.6 C), resp. rate 18, height 5\' 8"  (1.727 m), weight (!) 170.7 kg, last menstrual period 03/26/2015, SpO2 100 %. Gen: no distress, normal appearing HEENT: oral mucosa pink and moist, NCAT Cardio: Reg rate Chest: normal effort, normal rate of breathing Abd: soft, non-distended Ext: no edema Psych: pleasant, normal affect Skin:    Comments: Wound VAC in place to left buttock abscess.  Neurological:    Comments: Patient is alert.  No acute distress.  Oriented x3. Sit to stand and transfer from stretcher to bed with hand held assist   Assessment/Plan: 1. Functional deficits which require 3+ hours per day of interdisciplinary therapy in a comprehensive inpatient rehab setting. Physiatrist is providing close team supervision and 24 hour management of active medical problems listed below. Physiatrist and rehab team continue to assess barriers to discharge/monitor patient progress toward functional and medical goals  Care Tool:  Bathing    Body parts bathed by patient: Right arm, Chest, Left arm, Abdomen, Front perineal area, Right upper leg, Left upper leg, Face   Body  parts bathed by helper: Buttocks     Bathing assist Assist Level: Minimal Assistance - Patient > 75%     Upper Body Dressing/Undressing Upper body dressing   What is the patient wearing?: Bra, Pull over shirt    Upper body assist Assist Level: Set up assist    Lower Body Dressing/Undressing Lower body dressing      What is the patient wearing?: Pants     Lower body assist Assist for lower body dressing: Contact Guard/Touching assist     Toileting Toileting    Toileting assist Assist for toileting: Minimal Assistance - Patient > 75%     Transfers Chair/bed transfer  Transfers assist  Chair/bed transfer activity did not occur: Safety/medical concerns (No bariatric WC available. Nausea)  Chair/bed transfer assist level: Minimal Assistance - Patient > 75%     Locomotion Ambulation   Ambulation assist   Ambulation activity did not occur: Safety/medical concerns (Nausea)          Walk 10 feet activity   Assist  Walk 10 feet activity did not occur: Safety/medical concerns (Nausea)        Walk 50 feet activity   Assist Walk 50 feet  with 2 turns activity did not occur: Safety/medical concerns (Nausea)         Walk 150 feet activity   Assist Walk 150 feet activity did not occur: Safety/medical concerns (Nausea)         Walk 10 feet on uneven surface  activity   Assist Walk 10 feet on uneven surfaces activity did not occur: Safety/medical concerns (Nausea)         Wheelchair     Assist Will patient use wheelchair at discharge?: Yes Type of Wheelchair: Manual Wheelchair activity did not occur: Safety/medical concerns (No bariatric chair available)         Wheelchair 50 feet with 2 turns activity    Assist    Wheelchair 50 feet with 2 turns activity did not occur: Safety/medical concerns (No bariatric chair available)       Wheelchair 150 feet activity     Assist  Wheelchair 150 feet activity did not occur:  Safety/medical concerns (No bariatric chair available)       Blood pressure (!) 142/68, pulse 87, temperature 97.9 F (36.6 C), resp. rate 18, height 5\' 8"  (1.727 m), weight (!) 170.7 kg, last menstrual period 03/26/2015, SpO2 100 %.  Medical Problem List and Plan: 1.  Severe sepsis/septic shock secondary to necrotizing left gluteal wound.  Status post I&D wound debridement 01/04/2021 per Dr. 03/07/2021.  Follow-up general surgery.  Wound VAC changes as directed             -patient may not shower while wound vac in place             -ELOS/Goals: 2-3 weeks modI  -Continue CIR 2.  Impaired mobility, ambulating 50 feet: Continue Lovenox             -antiplatelet therapy: N/A 3. Post-surgical pain: continue Oxycodone 5 to 10 mg every 4 hours as needed as well as 15 mg Monday Wednesday Friday with wound VAC changes as needed and Robaxin as needed- using regularly. 4. Mood: Provide emotional support             -antipsychotic agents: N/A 5. Neuropsych: This patient is capable of making decisions on her own behalf. 6. Skin/Wound Care: Routine skin checks 7. Fluids/Electrolytes/Nutrition: Routine in and outs with follow-up chemistries 8.  ID.  Intravenous Zosyn 01/03/2021 and transition to Augmentin 01/10/2021. 9.  Diabetes mellitus.  Hemoglobin A1c 12.3. Check blood sugars before meals and at bedtime. CBGs improved to 84-166: decrease lantus to 44U.  10.  AKI/multifactorial.  Follow-up nephrology services.  Renal ultrasound unremarkable.Cr improving, repeat tomorrow 11.  Obesity.  BMI 60.14.  Dietary follow-up 12. Vitamin D deficiency: start ergocalciferol 50,00U weekly for 7 weeks 13. Magnesium deficiency: magnesium 4gm IV today, repeat magnesium level tomorrow AM.  14. Hypokalemia: klor 03/13/2021 today, repeat BMP tomorrow.     LOS: 2 days A FACE TO FACE EVALUATION WAS PERFORMED  Maclain Cohron 01/12/2021, 6:16 PM

## 2021-01-13 LAB — GLUCOSE, CAPILLARY
Glucose-Capillary: 109 mg/dL — ABNORMAL HIGH (ref 70–99)
Glucose-Capillary: 157 mg/dL — ABNORMAL HIGH (ref 70–99)
Glucose-Capillary: 169 mg/dL — ABNORMAL HIGH (ref 70–99)
Glucose-Capillary: 227 mg/dL — ABNORMAL HIGH (ref 70–99)

## 2021-01-13 LAB — BASIC METABOLIC PANEL
Anion gap: 9 (ref 5–15)
BUN: 19 mg/dL (ref 6–20)
CO2: 26 mmol/L (ref 22–32)
Calcium: 9 mg/dL (ref 8.9–10.3)
Chloride: 105 mmol/L (ref 98–111)
Creatinine, Ser: 3.05 mg/dL — ABNORMAL HIGH (ref 0.44–1.00)
GFR, Estimated: 17 mL/min — ABNORMAL LOW (ref >60)
Glucose, Bld: 117 mg/dL — ABNORMAL HIGH (ref 70–99)
Potassium: 4 mmol/L (ref 3.5–5.1)
Sodium: 140 mmol/L (ref 135–145)

## 2021-01-13 LAB — MAGNESIUM: Magnesium: 2.5 mg/dL — ABNORMAL HIGH (ref 1.7–2.4)

## 2021-01-13 MED ORDER — AMLODIPINE BESYLATE 5 MG PO TABS
5.0000 mg | ORAL_TABLET | Freq: Every day | ORAL | Status: DC
  Administered 2021-01-13 – 2021-01-14 (×2): 5 mg via ORAL
  Filled 2021-01-13 (×2): qty 1

## 2021-01-13 MED ORDER — SODIUM CHLORIDE 0.9 % IV SOLN
1.0000 g | Freq: Two times a day (BID) | INTRAVENOUS | Status: DC
  Administered 2021-01-13 – 2021-01-14 (×3): 1 g via INTRAVENOUS
  Filled 2021-01-13 (×4): qty 1

## 2021-01-13 NOTE — Plan of Care (Signed)
  Problem: RH BLADDER ELIMINATION Goal: RH STG MANAGE BLADDER WITH EQUIPMENT WITH ASSISTANCE Description: STG Manage Bladder With Equipment With min Assistance Outcome: Not Progressing; urgency   Problem: RH SKIN INTEGRITY Goal: RH STG SKIN FREE OF INFECTION/BREAKDOWN Description: Manage skin care with min assist Outcome: Not Progressing; wound vac

## 2021-01-13 NOTE — Progress Notes (Signed)
Pharmacy Antibiotic Note  Erin Davila is a 60 y.o. female admitted to CIR on 01/10/2021 left buttock wound s/p I&D - she was initially treated with Zosyn and transitioned to Augmentin to complete abx on 7/11.  Pharmacy has been consulted for meropenem dosing for UTI despite being on abx; patient experiencing symptoms and concern for ESBL, UCx is pending, UA shows rare bacteria and many epithelial cells. AKI on CKD noted, SCr down to 3.05 with baseline ~2 per Renal note. Tm 99.2, WBC elevated at 11.9.  Doxy PTA Zosyn 6/30 >> 7/7 Augmentin 7/7 >> 7/10 Meropenem 7/10 >>   6/30 Staph and MRSA PCR neg 7/9 UCx:  Plan: D/C Augmentin Meropenem 1 g IV q12h Monitor renal function, clinical progress, cultures/sensitivities F/U LOT and de-escalate as able   Height: 5\' 8"  (172.7 cm) Weight: (!) 170.7 kg (376 lb 5.2 oz) IBW/kg (Calculated) : 63.9  Temp (24hrs), Avg:98.5 F (36.9 C), Min:97.9 F (36.6 C), Max:99.2 F (37.3 C)  Recent Labs  Lab 01/09/21 0425 01/10/21 0449 01/11/21 0740 01/12/21 0554 01/13/21 0508  WBC 13.3*  --  11.9*  --   --   CREATININE 3.34* 3.17* 3.28* 3.09* 3.05*     Estimated Creatinine Clearance: 33.8 mL/min (A) (by C-G formula based on SCr of 3.05 mg/dL (H)).    Allergies  Allergen Reactions   Lactose Other (See Comments)    GI complaints     Thank you for involving pharmacy in this patient's care.  03/16/21, PharmD, BCPS Clinical Pharmacist Clinical phone for 01/13/2021 until 3p is 03/16/2021 01/13/2021 8:57 AM  **Pharmacist phone directory can be found on [email or web address] listed under [facility]**

## 2021-01-13 NOTE — IPOC Note (Signed)
Overall Plan of Care [facility]) Patient Details Name: Erin Davila MRN: [id number] DOB: 1961/03/26  Admitting Diagnosis: <principal problem not specified>  Hospital Problems: Active Problems:   Sepsis [facility])     Functional Problem List: Nursing Bladder, Bowel, Medication Management, Safety, Skin Integrity, Pain, Endurance  PT Balance, Endurance, Motor, Pain, Skin Integrity  OT Balance, Endurance, Motor, Pain, Safety  SLP    TR         Basic ADL's: OT Bathing, Dressing, Toileting     Advanced  ADL's: OT Simple Meal Preparation, Light Housekeeping     Transfers: PT Bed Mobility, Bed to Chair, Set designer, Oncologist: PT Ambulation, Psychologist, prison and probation services, Stairs     Additional Impairments: OT None  SLP        TR      Anticipated Outcomes Item Anticipated Outcome  Self Feeding    Swallowing      Basic self-care  Mod I - I  Toileting  Mod I   Bathroom Transfers Mod I  Bowel/Bladder  Manage bladder with min assist and bowel with mod I assist  Transfers  Mod I  Locomotion  Mod I  Communication     Cognition     Pain  At or below level 4  Safety/Judgment  maintain safety with cues/reminders   Therapy Plan: PT Intensity: Minimum of 1-2 x/day ,45 to 90 minutes PT Frequency: 5 out of 7 days PT Duration Estimated Length of Stay: 7-10 days OT Intensity: Minimum of 1-2 x/day, 45 to 90 minutes OT Frequency: 5 out of 7 days OT Duration/Estimated Length of Stay: 7-10 days     Due to the current state of emergency, patients may not be receiving their 3-hours of Medicare-mandated therapy.   Team Interventions: Nursing Interventions Patient/Family Education, Bowel Management, Pain Management, Skin Care/Wound Management, Medication Management, Disease Management/Prevention, Bladder Management, Discharge Planning  PT interventions Ambulation/gait training, Community reintegration, DME/adaptive equipment instruction, Neuromuscular re-education,  Stair training, UE/LE Strength taining/ROM, Wheelchair propulsion/positioning, Warden/ranger, Discharge planning, Pain management, Skin care/wound management, Therapeutic Activities, UE/LE Coordination activities, Disease management/prevention, Functional mobility training, Patient/family education, Therapeutic Exercise  OT Interventions Warden/ranger, Community reintegration, Discharge planning, Disease mangement/prevention, DME/adaptive equipment instruction, Functional mobility training, Pain management, Patient/family education, Psychosocial support, Self Care/advanced ADL retraining, Skin care/wound managment, Therapeutic Activities, Therapeutic Exercise  SLP Interventions    TR Interventions    SW/CM Interventions Discharge Planning, Psychosocial Support, Patient/Family Education   Barriers to Discharge MD  Medical stability  Nursing Decreased caregiver support, Home environment access/layout, Wound Care, Weight 1 level 2 ste; sister to assist  PT Decreased caregiver support, Inaccessible home environment, Wound Care, Home environment access/layout, Weight Pt lives with son who is leaving for college. Limited caregiver support.  OT Wound Care wound vac  SLP      SW Decreased caregiver support, Weight     Team Discharge Planning: Destination: PT-Home ,OT- Home , SLP-  Projected Follow-up: PT-Home health PT (Wound care), OT-  Home health OT (vs no f/u), SLP-  Projected Equipment Needs: PT-To be determined, OT- 3 in 1 bedside comode, SLP-  Equipment Details: PT- , OT-  Patient/family involved in discharge planning: PT- Patient,  OT-Patient, SLP-   MD ELOS: 10-14 days Medical Rehab Prognosis:  Excellent Assessment: Erin Davila is a 60 year old woman admitted to CIR with Severe sepsis/septic shock secondary to necrotizing left gluteal wound.  Status post I&D wound debridement 01/04/2021 per Dr. Daphine Davila.  Active medical  issues include urinary frequency and spasms  that keep her awake at night, hypertension, AKI, abnormal electrolytes. Medications have been adjusted and labs and vitals are being monitored regularly.    See Team Conference Notes for weekly updates to the plan of care

## 2021-01-13 NOTE — Progress Notes (Signed)
PROGRESS NOTE   Subjective/Complaints: Sleepy this morning She had asked RN about restarting her BP meds. BP is trending upward- will start amlodipine 5mg  Urinary frequency bothersome to her throughout the night. UA equivocal. Will broaden abxy to meropenum, awaiting UC  ROS: +pain at abscess site- much better controlled, +spasms when urinarting   Objective:   No results found. Recent Labs    01/11/21 0740  WBC 11.9*  HGB 8.5*  HCT 26.3*  PLT 436*   Recent Labs    01/12/21 0554 01/13/21 0508  NA 140 140  K 3.4* 4.0  CL 107 105  CO2 25 26  GLUCOSE 93 117*  BUN 22* 19  CREATININE 3.09* 3.05*  CALCIUM 8.3* 9.0    Intake/Output Summary (Last 24 hours) at 01/13/2021 0926 Last data filed at 01/12/2021 1930 Gross per 24 hour  Intake 660 ml  Output 75 ml  Net 585 ml        Physical Exam: Vital Signs Blood pressure (!) 158/74, pulse 91, temperature 98.5 F (36.9 C), temperature source Oral, resp. rate 16, height 5\' 8"  (1.727 m), weight (!) 170.7 kg, last menstrual period 03/26/2015, SpO2 100 %. Gen: no distress, normal appearing HEENT: oral mucosa pink and moist, NCAT Cardio: Reg rate Chest: normal effort, normal rate of breathing Abd: soft, non-distended Ext: no edema Psych: pleasant, normal affect Skin:    Comments: Wound VAC in place to left buttock abscess.  Neurological:    Comments: Patient is alert.  No acute distress.  Oriented x3. Sit to stand and transfer from stretcher to bed with hand held assist   Assessment/Plan: 1. Functional deficits which require 3+ hours per day of interdisciplinary therapy in a comprehensive inpatient rehab setting. Physiatrist is providing close team supervision and 24 hour management of active medical problems listed below. Physiatrist and rehab team continue to assess barriers to discharge/monitor patient progress toward functional and medical goals  Care  Tool:  Bathing    Body parts bathed by patient: Right arm, Chest, Left arm, Abdomen, Front perineal area, Right upper leg, Left upper leg, Face   Body parts bathed by helper: Buttocks     Bathing assist Assist Level: Minimal Assistance - Patient > 75%     Upper Body Dressing/Undressing Upper body dressing   What is the patient wearing?: Bra, Pull over shirt    Upper body assist Assist Level: Set up assist    Lower Body Dressing/Undressing Lower body dressing      What is the patient wearing?: Pants     Lower body assist Assist for lower body dressing: Contact Guard/Touching assist     Toileting Toileting    Toileting assist Assist for toileting: Minimal Assistance - Patient > 75%     Transfers Chair/bed transfer  Transfers assist  Chair/bed transfer activity did not occur: Safety/medical concerns (No bariatric WC available. Nausea)  Chair/bed transfer assist level: Minimal Assistance - Patient > 75%     Locomotion Ambulation   Ambulation assist   Ambulation activity did not occur: Safety/medical concerns (Nausea)          Walk 10 feet activity   Assist  Walk 10 feet activity did  not occur: Safety/medical concerns (Nausea)        Walk 50 feet activity   Assist Walk 50 feet with 2 turns activity did not occur: Safety/medical concerns (Nausea)         Walk 150 feet activity   Assist Walk 150 feet activity did not occur: Safety/medical concerns (Nausea)         Walk 10 feet on uneven surface  activity   Assist Walk 10 feet on uneven surfaces activity did not occur: Safety/medical concerns (Nausea)         Wheelchair     Assist Will patient use wheelchair at discharge?: Yes Type of Wheelchair: Manual Wheelchair activity did not occur: Safety/medical concerns (No bariatric chair available)         Wheelchair 50 feet with 2 turns activity    Assist    Wheelchair 50 feet with 2 turns activity did not occur:  Safety/medical concerns (No bariatric chair available)       Wheelchair 150 feet activity     Assist  Wheelchair 150 feet activity did not occur: Safety/medical concerns (No bariatric chair available)       Blood pressure (!) 158/74, pulse 91, temperature 98.5 F (36.9 C), temperature source Oral, resp. rate 16, height 5\' 8"  (1.727 m), weight (!) 170.7 kg, last menstrual period 03/26/2015, SpO2 100 %.  Medical Problem List and Plan: 1.  Severe sepsis/septic shock secondary to necrotizing left gluteal wound.  Status post I&D wound debridement 01/04/2021 per Dr. 03/07/2021.  Follow-up general surgery.  Wound VAC changes as directed             -patient may not shower while wound vac in place             -ELOS/Goals: 2-3 weeks modI  -Continue CIR 2.  Impaired mobility, ambulating 50 feet: Continue Lovenox             -antiplatelet therapy: N/A 3. Post-surgical pain: continue Oxycodone 5 to 10 mg every 4 hours as needed as well as 15 mg Monday Wednesday Friday with wound VAC changes as needed and Robaxin as needed- using regularly. 4. Mood: Provide emotional support             -antipsychotic agents: N/A 5. Neuropsych: This patient is capable of making decisions on her own behalf. 6. Skin/Wound Care: Routine skin checks 7. Fluids/Electrolytes/Nutrition: Routine in and outs with follow-up chemistries 8.  ID.  Intravenous Zosyn 01/03/2021 and transition to Augmentin 01/10/2021. 9.  Diabetes mellitus.  Hemoglobin A1c 12.3. Check blood sugars before meals and at bedtime. CBGs 109-172 continue Lantus 44U.  10.  AKI/multifactorial.  Follow-up nephrology services.  Renal ultrasound unremarkable.Cr improving, repeat tomorrow 11.  Obesity.  BMI 60.14.  Dietary follow-up 12. Vitamin D deficiency: start ergocalciferol 50,00U weekly for 7 weeks 13. Magnesium deficiency: magnesium 4gm IV 7/9, normal 7/10.  14. Hypokalemia: klor 9/9 on 7/9, normal on 7/10, repeat BMP 7/11. 15. HTN: start amlodipine 5mg   daily.  16. Urinary frequency/spasms: bothersome to her: urine cloudy and with increased WBC and rare bacteria: discussed with pharmacy broadening abx to meropenum until UC returns. Repeat cbc tomorrow, WBC slightly elevated 7/9.    LOS: 3 days A FACE TO FACE EVALUATION WAS PERFORMED  Erin Davila 01/13/2021, 9:26 AM

## 2021-01-13 NOTE — Progress Notes (Signed)
Physical Therapy Session Note  Patient Details  Name: [name] MRN: [id number] Date of Birth: Aug 23, 1960  Today's Date: 01/13/2021 PT Individual Time: 8242-3536 PT Individual Time Calculation (min): 60 min   Short Term Goals: Week 1:  PT Short Term Goal 1 (Week 1): STG = LTG due to LOS  Skilled Therapeutic Interventions/Progress Updates:    Pt received seated in recliner in room 15 min past scheduled start of therapy time due to this therapist assisting previously scheduled patient with toileting. Pt agreeable to PT session, no complaints of pain at rest. Sit to stand with CGA throughout session. Obtained 22x20 w/c for patient to trail. Stand pivot transfer to w/c with min HHA. Pt reports onset of sacral pain while seated in new w/c due to low seat height. Wheelchair also no appropriate size for patient's body size, will continue search for appropriately sized chair. Pt able to receive pain medication from nursing for sacral pain, rated 7/10. Ambulation 2 x 100 ft in hallway with min HHA and intermittent use of rail in hallway for balance. Seated BLE strengthening therex: LAQ, marches, heel/toe raises x 10 reps each. Standing marches x 5 reps, x 10 reps with BUE support HHA. Education with patient during session regarding current level of function, decreased endurance due to prolonged hospital stay, etc. Pt very receptive to all education and remains very motivated. IV team present at end of session to check patient's IV, requesting patient return to supine. Stand pivot transfer recliner to bed with min HHA. Sit to supine CGA for some LE management. Pt left in R sidelying in bed with needs in reach in care of IV team nurse.  Therapy Documentation Precautions:  Precautions Precautions: None Precaution Comments: wound vac in place on L buttocks Restrictions Weight Bearing Restrictions: No General: PT Amount of Missed Time (min): 15 Minutes PT Missed Treatment Reason: Other (Comment)  (therapist assisting another patient with toileting)  Therapy/Group: Individual Therapy  Peter Congo, PT, DPT, CSRS  01/13/2021, 4:58 PM

## 2021-01-13 NOTE — Progress Notes (Signed)
Occupational Therapy Session Note  Patient Details  Name: Erin Davila MRN: [id number] Date of Birth: 1960-08-15  Today's Date: 01/13/2021 OT Individual Time: 1100-1200 OT Individual Time Calculation (min): 60 min    Short Term Goals: Week 1:  OT Short Term Goal 1 (Week 1): STG = LTGs due to ELOS  Skilled Therapeutic Interventions/Progress Updates:    OT intervention with focus on bed mobility, sit<>stand, toilet tranfser, tolerting, bathing/dressing sit<>stand EOB, and safety awareness. Supine>sit EOB with supervision. Stand pivot transfer to [facility] with CGA/HHA. Bathing/dressing sit<>stand from EOB with min A for donning sock/shoes. CGA for standing balance. Transfer to recliner with HHA. Pt remained seated in recliner with NT entering room. All needs within reach.  Therapy Documentation Precautions:  Precautions Precautions: None Precaution Comments: wound vac in place on L buttocks Restrictions Weight Bearing Restrictions: No General: General OT Amount of Missed Time: 60 Minutes Vital Signs:  Pain:  Pt denies pain this morning   Therapy/Group: Individual Therapy  Rich Brave 01/13/2021, 12:19 PM

## 2021-01-13 NOTE — Progress Notes (Signed)
Patient awakening frequently throughout the night for toileting. Reports pain at 2/10, declines need for pain meds. Pt noted with difficulty transferring due to being "afraid I'm going to pee all over the place". Pt encouraged not to wait until urge to pee is so intense that she is incontinent. Timed voiding initiated and patient did better with next transfer.

## 2021-01-13 NOTE — Progress Notes (Signed)
Occupational Therapy Note  Patient Details  Name: Erin Davila MRN: [id number] Date of Birth: 31-May-1961  Today's Date: 01/13/2021 OT Missed Time: 60 Minutes Missed Time Reason: Patient fatigue  Pt sleeping upon arrival. Mod verbal cues to arouse. Pt stated she was up/down all night to use bathroom and lights were left on in room. She was, therefore, not able to rest. Requested that I return later. Will check back later. Scheduled to see pt again at 1100. Pt missed 60 mins skilled OT services.    Lavone Neri [facility] 01/13/2021, 8:35 AM

## 2021-01-14 DIAGNOSIS — A499 Bacterial infection, unspecified: Secondary | ICD-10-CM

## 2021-01-14 DIAGNOSIS — I10 Essential (primary) hypertension: Secondary | ICD-10-CM

## 2021-01-14 DIAGNOSIS — E669 Obesity, unspecified: Secondary | ICD-10-CM

## 2021-01-14 DIAGNOSIS — N390 Urinary tract infection, site not specified: Secondary | ICD-10-CM

## 2021-01-14 DIAGNOSIS — E1169 Type 2 diabetes mellitus with other specified complication: Secondary | ICD-10-CM

## 2021-01-14 DIAGNOSIS — E876 Hypokalemia: Secondary | ICD-10-CM

## 2021-01-14 LAB — CBC WITH DIFFERENTIAL/PLATELET
Abs Immature Granulocytes: 0.04 10*3/uL (ref 0.00–0.07)
Basophils Absolute: 0.1 10*3/uL (ref 0.0–0.1)
Basophils Relative: 1 %
Eosinophils Absolute: 0.4 10*3/uL (ref 0.0–0.5)
Eosinophils Relative: 5 %
HCT: 27 % — ABNORMAL LOW (ref 36.0–46.0)
Hemoglobin: 8.4 g/dL — ABNORMAL LOW (ref 12.0–15.0)
Immature Granulocytes: 1 %
Lymphocytes Relative: 25 %
Lymphs Abs: 1.9 10*3/uL (ref 0.7–4.0)
MCH: 31.6 pg (ref 26.0–34.0)
MCHC: 31.1 g/dL (ref 30.0–36.0)
MCV: 101.5 fL — ABNORMAL HIGH (ref 80.0–100.0)
Monocytes Absolute: 1.1 10*3/uL — ABNORMAL HIGH (ref 0.1–1.0)
Monocytes Relative: 14 %
Neutro Abs: 4.2 10*3/uL (ref 1.7–7.7)
Neutrophils Relative %: 54 %
Platelets: 452 10*3/uL — ABNORMAL HIGH (ref 150–400)
RBC: 2.66 MIL/uL — ABNORMAL LOW (ref 3.87–5.11)
RDW: 14.8 % (ref 11.5–15.5)
WBC: 7.8 10*3/uL (ref 4.0–10.5)
nRBC: 0 % (ref 0.0–0.2)

## 2021-01-14 LAB — GLUCOSE, CAPILLARY
Glucose-Capillary: 128 mg/dL — ABNORMAL HIGH (ref 70–99)
Glucose-Capillary: 160 mg/dL — ABNORMAL HIGH (ref 70–99)
Glucose-Capillary: 160 mg/dL — ABNORMAL HIGH (ref 70–99)
Glucose-Capillary: 179 mg/dL — ABNORMAL HIGH (ref 70–99)

## 2021-01-14 LAB — BASIC METABOLIC PANEL
Anion gap: 8 (ref 5–15)
BUN: 22 mg/dL — ABNORMAL HIGH (ref 6–20)
CO2: 25 mmol/L (ref 22–32)
Calcium: 8.4 mg/dL — ABNORMAL LOW (ref 8.9–10.3)
Chloride: 101 mmol/L (ref 98–111)
Creatinine, Ser: 3.23 mg/dL — ABNORMAL HIGH (ref 0.44–1.00)
GFR, Estimated: 16 mL/min — ABNORMAL LOW (ref >60)
Glucose, Bld: 137 mg/dL — ABNORMAL HIGH (ref 70–99)
Potassium: 3.8 mmol/L (ref 3.5–5.1)
Sodium: 134 mmol/L — ABNORMAL LOW (ref 135–145)

## 2021-01-14 LAB — URINE CULTURE: Culture: <10000 — AB

## 2021-01-14 MED ORDER — AMLODIPINE BESYLATE 2.5 MG PO TABS
2.5000 mg | ORAL_TABLET | Freq: Every day | ORAL | Status: DC
  Administered 2021-01-15 – 2021-01-21 (×7): 2.5 mg via ORAL
  Filled 2021-01-14 (×7): qty 1

## 2021-01-14 NOTE — Progress Notes (Signed)
Physical Therapy Session Note  Patient Details  Name: Erin Davila MRN: [id number] Date of Birth: Feb 19, 1961  Today's Date: 01/14/2021 PT Individual Time: 1300-1411 PT Individual Time Calculation (min): 71 min   Short Term Goals: Week 1:  PT Short Term Goal 1 (Week 1): STG = LTG due to LOS  Skilled Therapeutic Interventions/Progress Updates:   Received pt sitting in recliner, pt very upset about previous toileting encounter with NT and feeling like she wasn't completley clean; provided therapist listening and encouragement. Pt agreeable to PT treatment and denied any pain during session. Session with emphasis on functional mobility/transfers, generalized strengthening, dynamic standing balance/coordination, gait training, and improved activity tolerance. RN notified and present to disconnect IV for therapy. Donned socks and shoes with max A for time management purposes and provided pt with 24x18 WC. Pt requested a WC with increased depth, however none available but pt agreed to sit in [facility] long enough to be transported to/from gym. Attempted to locate bariatric RW however none available therefore used SPC. Pt transported to 4W dayroom in [facility] total A and transferred WC<>mat stand<>pivot without AD and CGA. Pt then ambulated 58ft x 2  and 131ft x 1 with SPC and CGA. Switched out bariatric SPC for lighter one per pt request. Played music to uplift mood and pt performed the following exercises sitting on mat with supervision and verbal cues for technique: -overhead chest press<>horizontal chest press with 1lb dowel 2x10  -hip flexion 2x25 bilaterally  -LAQ 2x25 bilaterally  -single arm bicep curls with 3lb dowel 2x10 bilaterally  Pt performed x 2 additional stand<>pivot transfers with SPC and CGA throughout session. Concluded session with pt sitting in recliner with all needs within reach awaiting OT session.   Therapy Documentation Precautions:  Precautions Precautions: None Precaution Comments:  wound vac in place on L buttocks Restrictions Weight Bearing Restrictions: No  Therapy/Group: Individual Therapy Martin Majestic PT, DPT   01/14/2021, 7:37 AM

## 2021-01-14 NOTE — Consult Note (Signed)
WOC Nurse Consult Note: Patient receiving care in [facility] 5C09.  Assisted with patient positioning by Ivonne Andrew, RN. Patient premedicated prior to NPWT dressing change. Reason for Consult: NPWT dressing change to left buttock Wound type: surgical Pressure Injury POA: Yes/No/NA Measurement: 12.8 cm x 11.2 cm x 4.1 cm.  When existing dressing removed, a false bottom had formed. This was opened up easily with finger manipulation. Two pieces of black foam placed into main wound bed, one placed into small inferior wound bed. Barrier ring applied to inferior border. Dressing bridged to left hip. Drape applied, immediate seal obtained. Wound bed: 100% pink/red Drainage (amount, consistency, odor) tan in cannister Periwound: slightly macerated. Dressing procedure/placement/frequency: Next dressing change is Wednesday. Erin Muster, RN, MSN, CWOCN, CNS-BC, pager [phone]

## 2021-01-14 NOTE — Progress Notes (Signed)
Occupational Therapy Session Note  Patient Details  Name: Erin Davila MRN: [id number] Date of Birth: [birth year]  Today's Date: 01/14/2021 Session 1 OT Individual Time: 3474-2595 OT Individual Time Calculation (min): 57 min  and Today's Date: 01/14/2021 OT Missed Time: 18 Minutes Missed Time Reason: Nursing care  Session 2 OT Individual Time: 1430-1500 OT Individual Time Calculation (min): 30 min  and Today's Date: 01/14/2021 OT Missed Time: 15 Minutes Missed Time Reason: Other (comment) (Pt ordering dinner, breakfast and lunch), Patient fatigue  Short Term Goals: Week 1:  OT Short Term Goal 1 (Week 1): STG = LTGs due to ELOS  Skilled Therapeutic Interventions/Progress Updates:  Session 1: Met pt lying in bed with HOB elevated, nurse present and reported wound team would be coming shortly. Made pt aware OT and OTS would return once they are finished. Returned with pt lying on right side, pt expressed 8/10 pain from wound vac but agreeable to session once extreme pain subsided. Treatment session was focused on toileting, functional transfers and self-care retraining. Pt was supervision for lying > sitting EOB to complete toileting and self-care tasks. Pt donned and doffed shoes with set-up assist. Pt stand pivoted to [facility] and completed clothing management and hygiene with CGA. Pt stand pivoted to EOB and required assist with untying gown to [facility] hospital gown. Pt was set-up for oral care. Pt was supervision for UB bathing while sitting EOB and LB bathing while side lying on EOB. Pt completed grooming tasks with set-up assist. Pt donned UB dressing with set-up assist. Pt was Min A for LB dressing due to needed assist to ensure wound vac would not be disturbed. Throughout session, pt was able to manage fatigue and encourage herself to participate in therapy. Pt was left sitting EOB, bed alarm on, all needs met.   Session 2: Met pt sitting in recliner with NT present, pt agreed to session. Pt  reported 0/10 pain, received medicine prior to session. Pt made aware to order 3 meals before 3 PM. First 15 minutes of session missed due to patient fatigue and needing to order meals. Treatment session was focused on completing 2 housekeeping task in room. Pt was CGA for sit > stand transfer. Pt ambulated to sink to organize countertops with no AD. Pt able to tolerate standing during activity. Pt ambulated back to recliner to organize nightstand while sitting due to fatigue. Pt requested to have recliner slightly propped on trash can to decrease possibility of pain from wound vac. Pt expressed feeling nauseous, nurse made aware. Pt is motivated to participate in therapy and will often push herself through pain to participate. Pt will benefit from education on pain management and energy conservation techniques. Pt was left sitting in recliner, all needs met.   Therapy Documentation Precautions:  Precautions Precautions: None Precaution Comments: wound vac in place on L buttocks Restrictions Weight Bearing Restrictions: No General: General Session 1 OT Amount of Missed Time: 18 Minutes Session 2 OT Amount of Missed Time: 15 Minutes Pain: Pain Assessment Pain Scale: 0-10 Pain Score: 8  Pain Type: Acute pain Pain Location: Buttocks Pain Descriptors / Indicators: Aching Pain Onset: Other (Comment) (Due to wound vac) Pain Intervention(s): Repositioned   Therapy/Group: Individual Therapy  Roshanda Balazs 01/14/2021, 3:08 PM

## 2021-01-14 NOTE — Progress Notes (Addendum)
PROGRESS NOTE   Subjective/Complaints: C/o pain from her wound/vac. Otherwise feels she's doing ok.   ROS: Patient denies fever, rash, sore throat, blurred vision, nausea, vomiting, diarrhea, cough, shortness of breath or chest pain, joint or back pain, headache, or mood change.   Objective:   No results found. Recent Labs    01/14/21 0725  WBC 7.8  HGB 8.4*  HCT 27.0*  PLT 452*   Recent Labs    01/13/21 0508 01/14/21 0725  NA 140 134*  K 4.0 3.8  CL 105 101  CO2 26 25  GLUCOSE 117* 137*  BUN 19 22*  CREATININE 3.05* 3.23*  CALCIUM 9.0 8.4*    Intake/Output Summary (Last 24 hours) at 01/14/2021 1222 Last data filed at 01/14/2021 0600 Gross per 24 hour  Intake 820 ml  Output 150 ml  Net 670 ml        Physical Exam: Vital Signs Blood pressure (!) 98/48, pulse 84, temperature 98.8 F (37.1 C), temperature source Oral, resp. rate 16, height 5\' 8"  (1.727 m), weight (!) 170.7 kg, last menstrual period 03/26/2015, SpO2 99 %. Constitutional: No distress . Vital signs reviewed. obese HEENT: EOMI, oral membranes moist Neck: supple Cardiovascular: RRR without murmur. No JVD    Respiratory/Chest: CTA Bilaterally without wheezes or rales. Normal effort    GI/Abdomen: BS +, non-tender, non-distended Ext: no clubbing, cyanosis, or edema Psych: pleasant and cooperative  Skin:    Comments: Wound VAC in place to left buttock abscess.  Neurological:    Comments: Patient is alert.  No acute distress.  Oriented x3. good bed mobility. Moves all 4's   Assessment/Plan: 1. Functional deficits which require 3+ hours per day of interdisciplinary therapy in a comprehensive inpatient rehab setting. Physiatrist is providing close team supervision and 24 hour management of active medical problems listed below. Physiatrist and rehab team continue to assess barriers to discharge/monitor patient progress toward functional and  medical goals  Care Tool:  Bathing    Body parts bathed by patient: Right arm, Chest, Left arm, Abdomen, Front perineal area, Right upper leg, Left upper leg, Face, Buttocks, Right lower leg, Left lower leg   Body parts bathed by helper: Buttocks     Bathing assist Assist Level: Supervision/Verbal cueing     Upper Body Dressing/Undressing Upper body dressing   What is the patient wearing?: Bra, Pull over shirt    Upper body assist Assist Level: Set up assist    Lower Body Dressing/Undressing Lower body dressing      What is the patient wearing?: Pants     Lower body assist Assist for lower body dressing: Minimal Assistance - Patient > 75%     Toileting Toileting    Toileting assist Assist for toileting: Contact Guard/Touching assist     Transfers Chair/bed transfer  Transfers assist  Chair/bed transfer activity did not occur: Safety/medical concerns (No bariatric WC available. Nausea)  Chair/bed transfer assist level: Minimal Assistance - Patient > 75% (hand held assist)     Locomotion Ambulation   Ambulation assist   Ambulation activity did not occur: Safety/medical concerns (Nausea)  Assist level: Minimal Assistance - Patient > 75% Assistive device: Hand held  assist Max distance: 100'   Walk 10 feet activity   Assist  Walk 10 feet activity did not occur: Safety/medical concerns (Nausea)  Assist level: Minimal Assistance - Patient > 75% Assistive device: Hand held assist   Walk 50 feet activity   Assist Walk 50 feet with 2 turns activity did not occur: Safety/medical concerns (Nausea)  Assist level: Minimal Assistance - Patient > 75% Assistive device: Hand held assist    Walk 150 feet activity   Assist Walk 150 feet activity did not occur: Safety/medical concerns (Nausea)         Walk 10 feet on uneven surface  activity   Assist Walk 10 feet on uneven surfaces activity did not occur: Safety/medical concerns (Nausea)          Wheelchair     Assist Will patient use wheelchair at discharge?: Yes Type of Wheelchair: Manual Wheelchair activity did not occur: Safety/medical concerns (No bariatric chair available)         Wheelchair 50 feet with 2 turns activity    Assist    Wheelchair 50 feet with 2 turns activity did not occur: Safety/medical concerns (No bariatric chair available)       Wheelchair 150 feet activity     Assist  Wheelchair 150 feet activity did not occur: Safety/medical concerns (No bariatric chair available)       Blood pressure (!) 98/48, pulse 84, temperature 98.8 F (37.1 C), temperature source Oral, resp. rate 16, height 5\' 8"  (1.727 m), weight (!) 170.7 kg, last menstrual period 03/26/2015, SpO2 99 %.  Medical Problem List and Plan: 1.  Severe sepsis/septic shock secondary to necrotizing left gluteal wound.  Status post I&D wound debridement 01/04/2021 per Dr. 03/07/2021.  Follow-up general surgery.  Wound VAC changes as directed             -patient may not shower while wound vac in place             -ELOS/Goals: 2-3 weeks modI  -Continue CIR therapies including PT, OT  2.  Impaired mobility, ambulating 50 feet: Continue Lovenox             -antiplatelet therapy: N/A 3. Post-surgical pain: continue Oxycodone 5 to 10 mg every 4 hours as needed as well as 15 mg Monday Wednesday Friday with wound VAC changes as needed and Robaxin as needed- using regularly. 4. Mood: Provide emotional support             -antipsychotic agents: N/A 5. Neuropsych: This patient is capable of making decisions on her own behalf. 6. Skin/Wound Care: Routine skin checks  -vac changes per [facility] team 7. Fluids/Electrolytes/Nutrition: encourage PO  -I personally reviewed the patient's labs today.   8.  ID.  Intravenous Zosyn 01/03/2021 and transitioned to Augmentin 01/10/2021. 9.  Diabetes mellitus.  Hemoglobin A1c 12.3. Check blood sugars before meals and at bedtime. CBGs 109-172  -sugars  inconsistent: continue Lantus 44U qam for now.  10.  AKI/multifactorial.  Follow-up nephrology services.  Renal ultrasound unremarkable.Cr stable to sl increased --3.23 7/11 11.  Obesity.  BMI 60.14.  Dietary follow-up 12. Vitamin D deficiency: start ergocalciferol 50,00U weekly for 7 weeks 13. Magnesium deficiency: magnesium 4gm IV 7/9, normal 7/10.  14. Hypokalemia: klor 9/9 on 7/9, normal on 7/10, 3.8 7/11 15. HTN: started amlodipine 5mg  daily--avoid HYPOtension   -decrease amlodipine to 2.5mg  qd 16. Urinary frequency/spasms: bothersome to her: urine cloudy and with increased WBC and rare bacteria: discussed with pharmacy broadening  abx to meropenum -ucx negative/<10k colonies, wbc's down to 11.9 -dc meropenum  17. Anemia: hgb holding at 8.4. MCV 100  -likely due chronic disease/illness     LOS: 4 days A FACE TO FACE EVALUATION WAS PERFORMED  Ranelle Oyster 01/14/2021, 12:22 PM

## 2021-01-15 LAB — GLUCOSE, CAPILLARY
Glucose-Capillary: 127 mg/dL — ABNORMAL HIGH (ref 70–99)
Glucose-Capillary: 190 mg/dL — ABNORMAL HIGH (ref 70–99)
Glucose-Capillary: 135 mg/dL — ABNORMAL HIGH (ref 70–99)
Glucose-Capillary: 143 mg/dL — ABNORMAL HIGH (ref 70–99)

## 2021-01-15 NOTE — Progress Notes (Signed)
Occupational Therapy Session Note  Patient Details  Name: [name] MRN: [id number] Date of Birth: [birth year]  Today's Date: 01/15/2021 OT Individual Time: 1300-1330 OT Individual Time Calculation (min): 30 min    Short Term Goals: Week 1:  OT Short Term Goal 1 (Week 1): STG = LTGs due to ELOS  Skilled Therapeutic Interventions/Progress Updates:    Pt received in room in chair and consented to OT tx. Session focused on BUE strengthening HEP to increase strength and activity tolerance for ADLs and functional transfers. Pt instructed in blue level 4 theraband exercises including tricep extension, chest press, and elbow flexion all for 2x15 with min cuing for proper technique with good carryover. Pt required frequent rest breaks due to fatigue. After tx, pt left sitting up in recliner with all needs met.  Therapy Documentation Precautions:  Precautions Precautions: None Precaution Comments: wound vac in place on L buttocks Restrictions Weight Bearing Restrictions: No  Vital Signs: Therapy Vitals Temp: 99 F (37.2 C) Pulse Rate: 88 Resp: 17 BP: 140/71 Patient Position (if appropriate): Sitting Oxygen Therapy SpO2: 97 % O2 Device: Room Air Pain: Pain Assessment Pain Scale: 0-10 Pain Score: 9  Pain Type: Acute pain Pain Location: Buttocks Pain Orientation: Mid Pain Descriptors / Indicators: Aching Pain Frequency: Intermittent Pain Intervention(s): Medication (See eMAR) Multiple Pain Sites: No   Therapy/Group: Individual Therapy  Vora Clover 01/15/2021, 7:41 AM

## 2021-01-15 NOTE — Progress Notes (Signed)
Occupational Therapy Session Note  Patient Details  Name: Erin Davila MRN: [id number] Date of Birth: 02-Oct-1960  Today's Date: 01/15/2021 Session 1 OT Individual Time: 9758-8325 OT Individual Time Calculation (min): 43 min   Session 2 OT Individual Time: 4982-6415 OT Individual Time Calculation (min): 57 min   Short Term Goals: Week 1:  OT Short Term Goal 1 (Week 1): STG = LTGs due to ELOS  Skilled Therapeutic Interventions/Progress Updates:  Session 1: Met pt sitting in recliner, pt agreed to session. Treatment session was focused on self-care retraining. Pt was CGA for sit > stand transfer. Pt ambulated to EOB with no AD and CGA. Pt completed oral care with set-up assist. Pt doffed UB clothing with Mod Ind sitting EOB, and LB clothing with CGA while standing. Pt completed UB bathing and dressing with set-up. Pt completed LB bathing with supervision due to side-lying to clean bottom. Pt was Min A for donning LB dressing due to fear of disturbing wound vac. Pt ambulated back to recliner with CGA. Pt expressed discomfort from wound vac, nurse made aware to bring medication. Left pt sitting in recliner, feet slightly inclined to relieve pressure from wound, all needs met.   Session 2: Met pt sitting in recliner, pt agreed to session. Treatment session focused on IADL participation to prepare for d/c. Pt requested to use BSC before leaving room. Pt donned footwear with Min A, pt able to tie her laces for the first time! Pt was close supervision for sit > stand transfer due to initial anterior lean when standing. Pt ambulated short distance to [facility] with CGA and no AD. Pt completed clothing management and hygiene with CGA. Pt ambulated to w/c and requested to bring "stick" [facility]). OT made pt aware of potential d/c date and pt expressed feeling very excited. Pt was brought to rehab apartment. OTS demonstrated safe shower bench transfer, pt reported being interested in having that at home. Pt able to  ambulate throughout bathroom to toilet and back to w/c with CGA. Pt expressed interested in cooking, OTS instructed pt to identify all ingredients for oatmeal and prepare the meal. Pt able to stabilize throughout kitchen using counters and prepare meal with no cues. Throughout session, OTS managed wound vac line. Pt reported feeling a sharp pain on wound when in w/c but pain would ease when pt cleaned UB slightly forward. Pt was brought back to room and requested to sit in recliner. Pt was CGA for sit > stand transfer back to recliner. Left pt in recliner, feet slightly inclined with all needs met.    Therapy Documentation Precautions:  Precautions Precautions: None Precaution Comments: wound vac in place on L buttocks Restrictions Weight Bearing Restrictions: No Vital Signs: Therapy Vitals Temp: 98 F (36.7 C) Pulse Rate: 80 Resp: 15 BP: 130/66 Patient Position (if appropriate): Sitting Oxygen Therapy SpO2: 100 % O2 Device: Room Air Pain: Pain Assessment Pain Scale: 0-10 Pain Score: 0-No pain   Therapy/Group: Individual Therapy  Bayard Males 01/15/2021, 3:29 PM

## 2021-01-15 NOTE — Patient Care Conference (Addendum)
Inpatient RehabilitationTeam Conference and Plan of Care Update Date: 01/15/2021   Time: 13:43 PM    Patient Name: Erin Davila      Medical Record Number: [id number]  Date of Birth: September 30, 1960 Sex: Female         Room/Bed: 5C09C/5C09C-01 Payor Info: Payor: Monia Pouch / Plan: [employer] NAP / Product Type: *No Product type* /    Admit Date/Time:  01/10/2021  1:10 PM  Primary Diagnosis:  <principal problem not specified>  Hospital Problems: Active Problems:   Sepsis [facility])    Expected Discharge Date: Expected Discharge Date: 01/20/21  Team Members Present: Physician leading conference: Dr. Sula Soda Care Coodinator Present: Chana Bode, RN, BSN, CRRN;Christina [place], BSW Nurse Present: Chana Bode, RN PT Present: Raechel Chute, PT OT Present: Rosalio Loud, OT     Current Status/Progress Goal Weekly Team Focus  Bowel/Bladder   continent to B/B, Last BM on 01/14/21  remain continent  Assess Q shift and PRN   Swallow/Nutrition/ Hydration             ADL's   Set-up for grooming, set-up for UB bathing and dressing, supervision for LB bathing due to side-lying, Min A for LB dressing due to wound vac, CGA for toilet transfer, supervision for toileting tasks  Mod I  Activity tolerance, IADL participation, sit > stand transfers, dynamic standing balance   Mobility   bed mobility CGA, transfers without AD CGA, gait 159ft with min A  Mod I, supervision stairs, set up A for WC mobility  functional mobility/transfers, generalized strengthening, dynamic standing balance/coordination, gait training, education, and endurance.   Communication             Safety/Cognition/ Behavioral Observations            Pain   c/o moderate to severe pain to wound site, managing with PRN oxy and robaxin- effective.  patient will be pan free  Assess pain Q shift and PRN   Skin   wound vac on to I & D site mid buttocks, no new open skin.  skin remain intact,assess skin Q shift and PRN and incision  site for s/s of infection.        Discharge Planning:  Home with son who is home from college for the summer and sister to check daily on her.   Team Discussion: Wound vac for ~7 days then remove; dressing changes for discharge. Son and sister to assist. Vit D and magnesium deficiency; supplements ordered. Pain controlled with prn medications Patient on target to meet rehab goals: yes, curently set up for ADLs and min assist for lower body dressing. CGA - supervision for transfers, requires close supervision with a cane. CGA for toileting due to poor endurance and anterior leaning. Goals set for CGA - Mod I.  *See Care Plan and progress notes for long and short-term goals.   Revisions to Treatment Plan:    Teaching Needs: Endurance and strengthening exercise, transfers, dressing changes, etc.  Current Barriers to Discharge: Decreased caregiver support, Home enviroment access/layout, Wound care, and insurance coverage for [facility] follow up services  Possible Resolutions to Barriers: Family education MD to double check plans for wound care/wound vac HH PT recommended w DME     Medical Summary Current Status: wound abscess requiring wound vac and oxycodone for pain control, magnesium/K+/Vitamin D deficiencies, HTN, BMI 57.22  Barriers to Discharge: Medical stability;Weight;Wound care  Barriers to Discharge Comments: wound abscess requiring wound vac and oxycodone for pain control, magnesium/K+/Vitamin D deficiencies,  HTN, BMI 57.22 Possible Resolutions to [employer]: continue wound vac, continue oxycodone PRN, continue magnesium, K+, and vitamin D supplementation, added amlodipine, provide dietary education   Continued Need for Acute Rehabilitation Level of Care: The patient requires daily medical management by a physician with specialized training in physical medicine and rehabilitation for the following reasons: Direction of a multidisciplinary physical rehabilitation program to  maximize functional independence : Yes Medical management of patient stability for increased activity during participation in an intensive rehabilitation regime.: Yes Analysis of laboratory values and/or radiology reports with any subsequent need for medication adjustment and/or medical intervention. : Yes   I attest that I was present, lead the team conference, and concur with the assessment and plan of the team.   Chana Bode B 01/15/2021, 2:01 PM

## 2021-01-15 NOTE — Progress Notes (Signed)
Physical Therapy Session Note  Patient Details  Name: Erin Davila MRN: [id number] Date of Birth: 1961/02/21  Today's Date: 01/15/2021 PT Individual Time: 0800-0855 PT Individual Time Calculation (min): 55 min   Short Term Goals: Week 1:  PT Short Term Goal 1 (Week 1): STG = LTG due to LOS  Skilled Therapeutic Interventions/Progress Updates:   Received pt sitting EOB finishing breakfast, pt agreeable to PT treatment, and denied any pain at rest but reported increased sacral discomfort with car transfer. Pt reported not sleeping well last night due to pain in buttocks but received pain medication at 7am and felt ready for therapy. Session with emphasis on functional mobility/transfers, generalized strengthening, dynamic standing balance/coordination, simulated car transfers, and improved activity tolerance. Pt donned pants, bra, and shirt sitting EOB with supervision. Donned socks and shoes with max A due to body habitus. Stand<>pivot bed<>bedside commode without AD and CGA. Pt able to void and perform peri-care with supervision. Bedside commode<>WC stand<>pivot without AD and CGA. Pt transported to 58M ortho gym in [facility] total A for time management purposes. Pt ambulated 61ft with SPC and CGA to car and attempted to get into car first by sitting on seat and getting legs in; pt unable to get LEs into car without pain and requested to attempt via side stepping. Pt able to side step LLE into car but reported increased sacral pain and unable to get RLE into car; discussed that this was not that safest method of entry. Pt reported she plans on getting in the backseat driver's side and she thinks that will be more comfortable than getting in on the L (passenger) side. Ambulated 34ft with SPC and CGA to mat and requested to get in sidelying to stretch hips. Pt performed the following exercises on mat with supervision and verbal cues for technique: -sidelying IT band stretch on wedge x20 seconds  bilaterally -seated trunk rotation x10 bilaterally with 2.2lb medicine ball -horizontal chest press with 2.2lb medicine ball 2x10 -hip adduction ball squeezes 2x12 Pt transported back to room in [facility] total A. Concluded session with pt sitting in recliner with all needs within reach.   Therapy Documentation Precautions:  Precautions Precautions: None Precaution Comments: wound vac in place on L buttocks Restrictions Weight Bearing Restrictions: No  Therapy/Group: Individual Therapy Martin Majestic PT, DPT   01/15/2021, 7:30 AM

## 2021-01-15 NOTE — Progress Notes (Addendum)
Patient ID: Erin Davila, female   DOB: 1960/11/04, 60 y.o.   MRN: [id number] Met with pt to discuss team conference goals mod/I level and discharge target 7/17. Many issues regarding if needing wound vac, obtaining a home health agency and dressing changes and who would be able to to them. MD working with [place] on needing wound vac or wet to dry dressing and have reached out to home health agencies regarding accepting. Pt aware may not be until Monday discharge due to numerous issues.  3:36 PM Contact [facility] to get an appointment earliest is 7/27 @ 1:15 pm. Will let know if something changes

## 2021-01-15 NOTE — Progress Notes (Signed)
PROGRESS NOTE   Subjective/Complaints: [name] has no complaints this morning Very pleased with her progress Pain better controlled  ROS: Patient denies fever, rash, sore throat, blurred vision, nausea, vomiting, diarrhea, cough, shortness of breath or chest pain, joint or back pain, headache, or mood change.   Objective:   No results found. Recent Labs    01/14/21 0725  WBC 7.8  HGB 8.4*  HCT 27.0*  PLT 452*   Recent Labs    01/13/21 0508 01/14/21 0725  NA 140 134*  K 4.0 3.8  CL 105 101  CO2 26 25  GLUCOSE 117* 137*  BUN 19 22*  CREATININE 3.05* 3.23*  CALCIUM 9.0 8.4*    Intake/Output Summary (Last 24 hours) at 01/15/2021 1331 Last data filed at 01/15/2021 0412 Gross per 24 hour  Intake 960 ml  Output --  Net 960 ml        Physical Exam: Vital Signs Blood pressure 130/66, pulse 80, temperature 98 F (36.7 C), resp. rate 15, height 5\' 8"  (1.727 m), weight (!) 170.7 kg, last menstrual period 03/26/2015, SpO2 100 %. Gen: no distress, normal appearing HEENT: oral mucosa pink and moist, NCAT Cardio: Reg rate Chest: normal effort, normal rate of breathing Abd: soft, non-distended Ext: no edema Psych: pleasant, normal affect  Skin:    Comments: Wound VAC in place to left buttock abscess.  Neurological:    Comments: Patient is alert.  No acute distress.  Oriented x3. good bed mobility. Moves all 4's   Assessment/Plan: 1. Functional deficits which require 3+ hours per day of interdisciplinary therapy in a comprehensive inpatient rehab setting. Physiatrist is providing close team supervision and 24 hour management of active medical problems listed below. Physiatrist and rehab team continue to assess barriers to discharge/monitor patient progress toward functional and medical goals  Care Tool:  Bathing    Body parts bathed by patient: Right arm, Chest, Left arm, Abdomen, Front perineal area,  Right upper leg, Left upper leg, Face, Buttocks, Right lower leg, Left lower leg   Body parts bathed by helper: Buttocks     Bathing assist Assist Level: Supervision/Verbal cueing     Upper Body Dressing/Undressing Upper body dressing   What is the patient wearing?: Bra, Pull over shirt    Upper body assist Assist Level: Set up assist    Lower Body Dressing/Undressing Lower body dressing      What is the patient wearing?: Pants     Lower body assist Assist for lower body dressing: Minimal Assistance - Patient > 75%     Toileting Toileting    Toileting assist Assist for toileting: Contact Guard/Touching assist     Transfers Chair/bed transfer  Transfers assist  Chair/bed transfer activity did not occur: Safety/medical concerns (No bariatric WC available. Nausea)  Chair/bed transfer assist level: Contact Guard/Touching assist     Locomotion Ambulation   Ambulation assist   Ambulation activity did not occur: Safety/medical concerns (Nausea)  Assist level: Contact Guard/Touching assist Assistive device: Cane-straight Max distance: 1102ft   Walk 10 feet activity   Assist  Walk 10 feet activity did not occur: Safety/medical concerns (Nausea)  Assist level: Contact Guard/Touching assist Assistive  device: Cane-straight   Walk 50 feet activity   Assist Walk 50 feet with 2 turns activity did not occur: Safety/medical concerns (Nausea)  Assist level: Contact Guard/Touching assist Assistive device: Cane-straight    Walk 150 feet activity   Assist Walk 150 feet activity did not occur: Safety/medical concerns (Nausea)         Walk 10 feet on uneven surface  activity   Assist Walk 10 feet on uneven surfaces activity did not occur: Safety/medical concerns (Nausea)         Wheelchair     Assist Will patient use wheelchair at discharge?: Yes Type of Wheelchair: Manual Wheelchair activity did not occur: Safety/medical concerns (No bariatric  chair available)         Wheelchair 50 feet with 2 turns activity    Assist    Wheelchair 50 feet with 2 turns activity did not occur: Safety/medical concerns (No bariatric chair available)       Wheelchair 150 feet activity     Assist  Wheelchair 150 feet activity did not occur: Safety/medical concerns (No bariatric chair available)       Blood pressure 130/66, pulse 80, temperature 98 F (36.7 C), resp. rate 15, height 5\' 8"  (1.727 m), weight (!) 170.7 kg, last menstrual period 03/26/2015, SpO2 100 %.  Medical Problem List and Plan: 1.  Severe sepsis/septic shock secondary to necrotizing left gluteal wound.  Status post I&D wound debridement 01/04/2021 per Dr. 03/07/2021.  Follow-up general surgery.  Wound VAC changes as directed             -patient may not shower while wound vac in place             -ELOS/Goals: 2-3 weeks modI  -Continue CIR therapies including PT, OT  2.  Impaired mobility, ambulating 160 feet: discontinue Lovenox             -antiplatelet therapy: N/A 3. Post-surgical pain: continue Oxycodone 5 to 10 mg every 4 hours as needed as well as 15 mg Monday Wednesday Friday with wound VAC changes as needed and Robaxin as needed- using regularly. 4. Mood: Provide emotional support             -antipsychotic agents: N/A 5. Neuropsych: This patient is capable of making decisions on her own behalf. 6. Skin/Wound Care: Routine skin checks  -vac changes per [facility] team 7. Fluids/Electrolytes/Nutrition: encourage PO  -I personally reviewed the patient's labs today.   8.  ID.  Intravenous Zosyn 01/03/2021 and transitioned to Augmentin 01/10/2021. 9.  Diabetes mellitus.  Hemoglobin A1c 12.3. Check blood sugars before meals and at bedtime. CBGs 109-172  -sugars inconsistent: continue Lantus 44U qam for now.  10.  AKI/multifactorial.  Follow-up nephrology services.  Renal ultrasound unremarkable.Cr stable to sl increased --3.23 7/11 11.  Obesity.  BMI 60.14.  Dietary  follow-up 12. Vitamin D deficiency: continue ergocalciferol 50,00U weekly for 7 weeks 13. Magnesium deficiency: magnesium 4gm IV 7/9, normal 7/10.  14. Hypokalemia: klor 9/9 on 7/9, normal on 7/10, 3.8 7/11 15. HTN: started amlodipine 5mg  daily--avoid HYPOtension   -continue amlodipine to 2.5mg  qd 16. Urinary frequency/spasms: bothersome to her: urine cloudy and with increased WBC and rare bacteria: discussed with pharmacy broadening abx to meropenum -ucx negative/<10k colonies, wbc's down to 11.9 -dc meropenum  17. Anemia: hgb holding at 8.4. MCV 100  -likely due chronic disease/illness     LOS: 5 days A FACE TO FACE EVALUATION WAS PERFORMED  9/11 Erin Davila 01/15/2021,  1:31 PM

## 2021-01-16 DIAGNOSIS — R5381 Other malaise: Secondary | ICD-10-CM

## 2021-01-16 LAB — URINALYSIS, ROUTINE W REFLEX MICROSCOPIC
Bacteria, UA: NONE SEEN
Bilirubin Urine: NEGATIVE
Glucose, UA: NEGATIVE mg/dL
Ketones, ur: NEGATIVE mg/dL
Nitrite: NEGATIVE
Protein, ur: 30 mg/dL — AB
Specific Gravity, Urine: 1.013 (ref 1.005–1.030)
WBC, UA: >50 WBC/hpf — ABNORMAL HIGH (ref 0–5)
pH: 5 (ref 5.0–8.0)

## 2021-01-16 LAB — GLUCOSE, CAPILLARY
Glucose-Capillary: 85 mg/dL (ref 70–99)
Glucose-Capillary: 133 mg/dL — ABNORMAL HIGH (ref 70–99)
Glucose-Capillary: 75 mg/dL (ref 70–99)
Glucose-Capillary: 117 mg/dL — ABNORMAL HIGH (ref 70–99)

## 2021-01-16 LAB — BASIC METABOLIC PANEL
Anion gap: 6 (ref 5–15)
BUN: 26 mg/dL — ABNORMAL HIGH (ref 6–20)
CO2: 24 mmol/L (ref 22–32)
Calcium: 8.9 mg/dL (ref 8.9–10.3)
Chloride: 106 mmol/L (ref 98–111)
Creatinine, Ser: 3.61 mg/dL — ABNORMAL HIGH (ref 0.44–1.00)
GFR, Estimated: 14 mL/min — ABNORMAL LOW (ref >60)
Glucose, Bld: 91 mg/dL (ref 70–99)
Potassium: 4.6 mmol/L (ref 3.5–5.1)
Sodium: 136 mmol/L (ref 135–145)

## 2021-01-16 LAB — SODIUM, URINE, RANDOM: Sodium, Ur: 16 mmol/L

## 2021-01-16 LAB — CREATININE, URINE, RANDOM: Creatinine, Urine: 221.82 mg/dL

## 2021-01-16 NOTE — Progress Notes (Signed)
Physical Therapy Session Note  Patient Details  Name: Erin Davila MRN: [id number] Date of Birth: Nov 27, 1960  Today's Date: 01/16/2021 PT Individual Time: 8242-3536 and 1443-1540 PT Individual Time Calculation (min): 85 min and 26 min  Short Term Goals: Week 1:  PT Short Term Goal 1 (Week 1): STG = LTG due to LOS  Skilled Therapeutic Interventions/Progress Updates:   Treatment Session 1 Received pt sitting EOB very excited that her wound vac is now removed! Pt agreeable to PT treatment and denied any pain during session. Pt is talkative and requires cues for re-direction. Session with emphasis on dressing, functional mobility/transfers, generalized strengthening, dynamic standing balance/coordination, gait training, stair navigation, and improved activity tolerance. Doffed gown and donned bra, shirt, panties, and pants sitting EOB with set up assist. Donned socks and shoes with max A for time management purposes.  Sit<>stand without AD and supervision and pulled underwear/pants over hips with close supervision. Pt ambulated to sink and washed hands with CGA. Pt transported to 4W therapy gym in [facility] total A for time management purposes and navigated 8 steps with 2 rails and CGA ascending and descending with a step to pattern. Pt took seated rest break then navigated additional 4 steps with 1 rail ascending but used 1 rail and SPC when descending with CGA/min A for balance. Pt then ambulated 124ft with SPC and CGA/close supervision. Pt demonstrated wide BOS, decreased trunk rotation and arm swing, and decreased bilateral foot clearance but no LOB noted. Worked on dynamic standing balance tossing 1.1lb medicine ball against rebounder with CGA for balance for 45 seconds x1 and 60 seconds x2. Sit<>stand without AD and supervision and performed the following exercises standing with BUE support and close supervision: -high knee marching x10 bilaterally -hip abduction x10 bilaterally  -heel raises  x15 -hamstring curls x10 bilaterally  Pt required numerous rest/water breaks throughout session due to fatigue and SOB. Pt transported back to room in [facility] total A and reported urge to use restroom. Ambulated 63ft without AD and CGA to bedside commode and able to manage clothing with close supervision. Pt requested increased time to void, therefore concluded session with pt sitting on bedside commode with call bell within reach, NT arrived to attend to care.   Treatment Session 2 Received pt sitting in recliner eating lunch with family present at bedside. Pt requested therapist return in 10 minutes so she could finish eating. Upon therapist's return pt agreeable to PT treatment and denied any pain during session. Session with emphasis on functional mobility/transfers, generalized strengthening, gait training, and improved activity tolerance. Sit<>stands with SPC and supervision throughout session and ambulated 146ft x 2 trials with SPC and close supervision with 5 standing rest break at railing in hallway. Pt required frequent rest breaks due to SOB/fatigue. Pt then performed the following exercises sitting in recliner with supervision and verbal cues for technique: -heel raises x12 -LAQ x12 bilaterally  -hip flexion x12 bilaterally Concluded session with pt sitting in recliner with all needs within reach.    Therapy Documentation Precautions:  Precautions Precautions: None Precaution Comments: wound vac in place on L buttocks Restrictions Weight Bearing Restrictions: No  Therapy/Group: Individual Therapy Martin Majestic PT, DPT   01/16/2021, 7:22 AM

## 2021-01-16 NOTE — Progress Notes (Signed)
[facility] Resident Progress Note   Subjective:   Ms Camay Pedigo is a 60 year old female with PMHx of hypertension, type II diabetes mellitus, severe obesity, and CKD with baseline sCr of 2.0 admitted for necrotizing wound with drainage of left buttock s/p I&D and wound vac placement by surgery. Patient also had uncontrolled diabetes for which hospitalist consulted. Nephrology also consulted for AKI. Patient last evaluated on 7/6 for nonoligouric AKI suspected to be multifactorial in setting of dehydration, recent surgery, NSAID use and ARB use. Nephrology reconsulted for worsening renal function. At bedside, patient denies any changes in urinary symptoms at this time. She reports maintaining oral hydration and denies any other acute concerns at this time.   Objective Vitals:   01/15/21 2041 01/15/21 2220 01/16/21 0538 01/16/21 1351  BP: (!) 146/80  (!) 145/75 118/68  Pulse: 90  84 86  Resp: 17  17 18   Temp: 99.1 F (37.3 C) 98.6 F (37 C) 98.9 F (37.2 C) 98 F (36.7 C)  TempSrc: Oral Oral Oral Oral  SpO2: 99%  96% 100%  Weight:      Height:       Physical Exam General: obese middle aged female, sitting up in bedside chair, no acute distress Heart: s1 and s2 present, regular rate and rhythm Lungs: clear to auscultation bilaterally, on room air  Abdomen: obese abdomen, soft, nontender, +BS Extremities: warm and dry, trace pitting edema of bilat lower extremities; distal pulses intact   Additional Objective Labs: Basic Metabolic Panel: Recent Labs  Lab 01/10/21 0449 01/11/21 0740 01/13/21 0508 01/14/21 0725 01/16/21 0756  NA 140   < > 140 134* 136  K 3.6   < > 4.0 3.8 4.6  CL 105   < > 105 101 106  CO2 26   < > 26 25 24   GLUCOSE 136*   < > 117* 137* 91  BUN 29*   < > 19 22* 26*  CREATININE 3.17*   < > 3.05* 3.23* 3.61*  CALCIUM 8.4*   < > 9.0 8.4* 8.9  PHOS 3.5  --   --   --   --    < > = values in this interval not displayed.   Liver Function  Tests: Recent Labs  Lab 01/10/21 0449 01/11/21 0740  AST  --  26  ALT  --  17  ALKPHOS  --  111  BILITOT  --  0.9  PROT  --  6.7  ALBUMIN 2.4* 2.0*   No results for input(s): LIPASE, AMYLASE in the last 168 hours. CBC: Recent Labs  Lab 01/11/21 0740 01/14/21 0725  WBC 11.9* 7.8  NEUTROABS 7.5 4.2  HGB 8.5* 8.4*  HCT 26.3* 27.0*  MCV 98.5 101.5*  PLT 436* 452*   Blood Culture    Component Value Date/Time   SDES URINE, RANDOM 01/12/2021 1816   SPECREQUEST NONE 01/12/2021 1816   CULT (A) 01/12/2021 1816    <10,000 COLONIES/mL INSIGNIFICANT GROWTH Performed at [facility], [address] [place], [address] [place]    REPTSTATUS 01/14/2021 FINAL 01/12/2021 1816    Cardiac Enzymes: No results for input(s): CKTOTAL, CKMB, CKMBINDEX, TROPONINI in the last 168 hours. CBG: Recent Labs  Lab 01/15/21 1128 01/15/21 1639 01/15/21 2040 01/16/21 0536 01/16/21 1146  GLUCAP 190* 135* 143* 85 133*    Medications:   amLODipine  2.5 mg Oral Daily   enoxaparin (LOVENOX) injection  30 mg Subcutaneous Q12H   insulin aspart  0-20 Units Subcutaneous TID WC   insulin glargine  44 Units Subcutaneous Daily   oxyCODONE  15 mg Oral Once per day on Mon Wed Fri   Vitamin D (Ergocalciferol)  50,000 Units Oral Q7 days    Assessment/Plan: Karine Garn is a 60 year old female with PMHx of type II DM, hypertension, severe obesity and CKD admitted for wound management and AKI.   Nonoligouric AKI on CKD: Baseline sCr 2.0; sCr has been stable recently 3.2-3.3; however, noted to have worsening sCr to 3.61 this AM. Patient had one episode of hypotension on 7/11, suspect this may contributed to ATN. Patient encouraged to maintain PO intake. Will send for repeat urinalysis and urine studies. Will continue to trend renal function and strict I&O.  Left gluteal wound s/p I&D and antibiotics: Wound vac d/c'd today per WOC. She has completed course of antibiotics. Continue to monitor   Type  II diabetes mellitus: CBG's at goal of <180 to promote healing of wound.  Hypertension: Currently normotensive. Had one episode of hypotension on 7/11, hold parameters for amlodipine for SBP<110.  Macrocytic anemia, previously normocytic anemia: iron studies, vitamin B12 and folate level   Thrombocytosis: likely reactive in setting of infection, continue to monitor   Eliezer Bottom, MD Internal Medicine, PGY-3 01/16/21 5:43 PM Pager # [phone]

## 2021-01-16 NOTE — Consult Note (Signed)
WOC Nurse wound follow up Patient receiving care in [facility] 5C09. Wound type: left buttock surgical wound Measurement: measured 2 days ago. Wound bed: 100% beefy red granulation tissue with just small scattered areas of fibrinous tan along spots on the wound edges. Drainage (amount, consistency, odor) scant bright red bleeding with VAC foam removal. Periwound: intact, protected by skin film barrier prior to saline dressing application. Dressing procedure/placement/frequency: Saline moistened gauze placed into the main wound bed and the very small, superficial wound inferior to the main wound. Covered with ABD pad, taped in place.  Monitor the wound area(s) for worsening of condition such as: Signs/symptoms of infection,  Increase in size,  Development of or worsening of odor, Development of pain, or increased pain at the affected locations.  Notify the medical team if any of these develop.  I plan to see the wound Thursday or Friday of this week to determine the response to therapy change. Helmut Muster, RN, MSN, CWOCN, CNS-BC, pager [phone]

## 2021-01-16 NOTE — Progress Notes (Signed)
PROGRESS NOTE   Subjective/Complaints: Has pain on her backside at wound/vac. Vac lost suction last night. Due for change today.   ROS: Patient denies fever, rash, sore throat, blurred vision, nausea, vomiting, diarrhea, cough, shortness of breath or chest pain,  headache, or mood change.   Objective:   No results found. Recent Labs    01/14/21 0725  WBC 7.8  HGB 8.4*  HCT 27.0*  PLT 452*   Recent Labs    01/14/21 0725 01/16/21 0756  NA 134* 136  K 3.8 4.6  CL 101 106  CO2 25 24  GLUCOSE 137* 91  BUN 22* 26*  CREATININE 3.23* 3.61*  CALCIUM 8.4* 8.9    Intake/Output Summary (Last 24 hours) at 01/16/2021 0900 Last data filed at 01/16/2021 0830 Gross per 24 hour  Intake 480 ml  Output --  Net 480 ml        Physical Exam: Vital Signs Blood pressure (!) 145/75, pulse 84, temperature 98.9 F (37.2 C), temperature source Oral, resp. rate 17, height 5\' 8"  (1.727 m), weight (!) 170.7 kg, last menstrual period 03/26/2015, SpO2 96 %. Constitutional: No distress . Vital signs reviewed. HEENT: EOMI, oral membranes moist Neck: supple Cardiovascular: RRR without murmur. No JVD    Respiratory/Chest: CTA Bilaterally without wheezes or rales. Normal effort    GI/Abdomen: BS +, non-tender, non-distended Ext: no clubbing, cyanosis, or edema Psych: pleasant and cooperative  Skin:    Comments: Wound VAC in place to left buttock abscess. sealed currently Neurological:    Comments: Patient is alert.  No acute distress.  Oriented x3. good bed mobility. Moves all 4's. Good bed mobility   Assessment/Plan: 1. Functional deficits which require 3+ hours per day of interdisciplinary therapy in a comprehensive inpatient rehab setting. Physiatrist is providing close team supervision and 24 hour management of active medical problems listed below. Physiatrist and rehab team continue to assess barriers to discharge/monitor patient  progress toward functional and medical goals  Care Tool:  Bathing    Body parts bathed by patient: Right arm, Chest, Left arm, Abdomen, Front perineal area, Right upper leg, Left upper leg, Face, Buttocks, Right lower leg, Left lower leg   Body parts bathed by helper: Buttocks     Bathing assist Assist Level: Supervision/Verbal cueing     Upper Body Dressing/Undressing Upper body dressing   What is the patient wearing?: Bra, Pull over shirt    Upper body assist Assist Level: Set up assist    Lower Body Dressing/Undressing Lower body dressing      What is the patient wearing?: Pants     Lower body assist Assist for lower body dressing: Minimal Assistance - Patient > 75%     Toileting Toileting    Toileting assist Assist for toileting: Contact Guard/Touching assist     Transfers Chair/bed transfer  Transfers assist  Chair/bed transfer activity did not occur: Safety/medical concerns (No bariatric WC available. Nausea)  Chair/bed transfer assist level: Contact Guard/Touching assist     Locomotion Ambulation   Ambulation assist   Ambulation activity did not occur: Safety/medical concerns (Nausea)  Assist level: Contact Guard/Touching assist Assistive device: Cane-straight Max distance: 144ft  Walk 10 feet activity   Assist  Walk 10 feet activity did not occur: Safety/medical concerns (Nausea)  Assist level: Contact Guard/Touching assist Assistive device: Cane-straight   Walk 50 feet activity   Assist Walk 50 feet with 2 turns activity did not occur: Safety/medical concerns (Nausea)  Assist level: Contact Guard/Touching assist Assistive device: Cane-straight    Walk 150 feet activity   Assist Walk 150 feet activity did not occur: Safety/medical concerns (Nausea)         Walk 10 feet on uneven surface  activity   Assist Walk 10 feet on uneven surfaces activity did not occur: Safety/medical concerns (Nausea)          Wheelchair     Assist Will patient use wheelchair at discharge?: Yes Type of Wheelchair: Manual Wheelchair activity did not occur: Safety/medical concerns (No bariatric chair available)         Wheelchair 50 feet with 2 turns activity    Assist    Wheelchair 50 feet with 2 turns activity did not occur: Safety/medical concerns (No bariatric chair available)       Wheelchair 150 feet activity     Assist  Wheelchair 150 feet activity did not occur: Safety/medical concerns (No bariatric chair available)       Blood pressure (!) 145/75, pulse 84, temperature 98.9 F (37.2 C), temperature source Oral, resp. rate 17, height 5\' 8"  (1.727 m), weight (!) 170.7 kg, last menstrual period 03/26/2015, SpO2 96 %.  Medical Problem List and Plan: 1.  Severe sepsis/septic shock secondary to necrotizing left gluteal wound.  Status post I&D wound debridement 01/04/2021 per Dr. 03/07/2021.  Follow-up general surgery.  Wound VAC changes as directed             -patient may not shower while wound vac in place             -ELOS/Goals: 2-3 weeks modI  -Continue CIR therapies including PT, OT, and SLP  2.  Impaired mobility, ambulating 160 feet: discontinue Lovenox             -antiplatelet therapy: N/A 3. Post-surgical pain: continue Oxycodone 5 to 10 mg every 4 hours as needed as well as 15 mg Monday Wednesday Friday with wound VAC changes as needed and Robaxin as needed- using regularly. 4. Mood: Provide emotional support             -antipsychotic agents: N/A 5. Neuropsych: This patient is capable of making decisions on her own behalf. 6. Skin/Wound Care: Routine skin checks  -vac changes per WOC team  MWF 7. Fluids/Electrolytes/Nutrition: encourage PO  -I personally reviewed the patient's labs today.   8.  ID.  Intravenous Zosyn 01/03/2021 and transitioned to Augmentin 01/10/2021. 9.  Diabetes mellitus.  Hemoglobin A1c 12.3.    CBG (last 3)  Recent Labs    01/15/21 1639 01/15/21 2040  01/16/21 0536  GLUCAP 135* 143* 85  -sugars under fair control continue Lantus 44U qam for now.  10.  AKI/multifactorial.  Follow-up nephrology services.  Renal ultrasound unremarkable. 7/13 Cr with further increase to 3.61 today.       -will request renal f/u 11.  Obesity.  BMI 60.14.  Dietary follow-up 12. Vitamin D deficiency: continue ergocalciferol 50,00U weekly for 7 weeks 13. Magnesium deficiency: magnesium 4gm IV 7/9, normal 7/10.  14. Hypokalemia: klor 9/9 on 7/9, normal on 7/10, 3.8 7/11 15. HTN: started amlodipine 5mg  daily--avoid HYPOtension   -reasonable control. continue amlodipine to 2.5mg  qd 16. Urinary  frequency/spasms: bothersome to her: urine cloudy and with increased WBC and rare bacteria: discussed with pharmacy broadening abx to meropenum -ucx negative/<10k colonies, wbc's down to 11.9 -dc meropenum  17. Anemia: hgb holding at 8.4. MCV 100  -likely due chronic disease/illness     LOS: 6 days A FACE TO FACE EVALUATION WAS PERFORMED  Ranelle Oyster 01/16/2021, 9:00 AM

## 2021-01-16 NOTE — Progress Notes (Signed)
Patient ID: Erin Davila, female   DOB: [birth year], 60 y.o.   MRN: [id number]  Met with pt to discuss discharge plan now. Have moved discharge to Monday 7/18 to figure out wound care issues. Wound vac removed today to see if wet to dry dressing will continue would healing. WOC RN to check again tomorrow. If not will place wound vac back on. Continue to try to find home health agency to accept [employer] and provide a HHRN. Have made a Wound Clinic appointment for pt on 7/27. Will see if son can assist with dressing changes until leaves to go back to college first of August. Pt made aware will need to get tub bench on-line due to no equipment company carries bariatric tub bench's and are private pay. Pt will get on her own. Has cane so no equipment needs. Will await decision regarding wound care and work on options.

## 2021-01-16 NOTE — Progress Notes (Signed)
Occupational Therapy Session Note  Patient Details  Name: Erin Davila MRN: [id number] Date of Birth: 03-08-1961  Today's Date: 01/16/2021 OT Individual Time: 6734-1937 OT Individual Time Calculation (min): 57 min    Short Term Goals: Week 1:  OT Short Term Goal 1 (Week 1): STG = LTGs due to ELOS  Skilled Therapeutic Interventions/Progress Updates:    Treatment session with focus on self-care retraining and energy conservation in preparation for upcoming d/c.  Pt received upright in recliner reporting fatigue from prior therapy sessions and eating lunch late.  Engaged in discussion with focus on energy conservation and pt goals in preparation for d/c.  Discussed primary focus to be placed on tasks pt wants to accomplish and delegate remainder of tasks until she has more endurance to complete tasks.  Therapist provided pt with handouts for energy conservation strategies and discusses in relation to pt goals. Pt reports need to toilet. Pt completed short distance ambulatory transfer to [facility] in room without AD.  Pt completed transfer with supervision.  Pt able to complete clothing management and hygiene this session as wound vac removed with supervision.  Pt returned to recliner and remained upright with all needs in reach.  Therapy Documentation Precautions:  Precautions Precautions: None Precaution Comments: wound vac in place on L buttocks Restrictions Weight Bearing Restrictions: No General:   Vital Signs: Therapy Vitals Temp: 98 F (36.7 C) Temp Source: Oral Pulse Rate: 86 Resp: 18 BP: 118/68 Patient Position (if appropriate): Sitting Oxygen Therapy SpO2: 100 % O2 Device: Room Air Pain:  Pt with no c/o pain   Therapy/Group: Individual Therapy  Rosalio Loud 01/16/2021, 3:15 PM

## 2021-01-17 LAB — VITAMIN B12: Vitamin B-12: 218 pg/mL (ref 180–914)

## 2021-01-17 LAB — RENAL FUNCTION PANEL
Albumin: 2.3 g/dL — ABNORMAL LOW (ref 3.5–5.0)
Anion gap: 8 (ref 5–15)
BUN: 30 mg/dL — ABNORMAL HIGH (ref 6–20)
CO2: 22 mmol/L (ref 22–32)
Calcium: 8.4 mg/dL — ABNORMAL LOW (ref 8.9–10.3)
Chloride: 105 mmol/L (ref 98–111)
Creatinine, Ser: 3.61 mg/dL — ABNORMAL HIGH (ref 0.44–1.00)
GFR, Estimated: 14 mL/min — ABNORMAL LOW (ref >60)
Glucose, Bld: 101 mg/dL — ABNORMAL HIGH (ref 70–99)
Phosphorus: 5 mg/dL — ABNORMAL HIGH (ref 2.5–4.6)
Potassium: 4.4 mmol/L (ref 3.5–5.1)
Sodium: 135 mmol/L (ref 135–145)

## 2021-01-17 LAB — GLUCOSE, CAPILLARY
Glucose-Capillary: 97 mg/dL (ref 70–99)
Glucose-Capillary: 164 mg/dL — ABNORMAL HIGH (ref 70–99)
Glucose-Capillary: 122 mg/dL — ABNORMAL HIGH (ref 70–99)
Glucose-Capillary: 143 mg/dL — ABNORMAL HIGH (ref 70–99)

## 2021-01-17 LAB — CBC
HCT: 26.8 % — ABNORMAL LOW (ref 36.0–46.0)
Hemoglobin: 8.1 g/dL — ABNORMAL LOW (ref 12.0–15.0)
MCH: 30.9 pg (ref 26.0–34.0)
MCHC: 30.2 g/dL (ref 30.0–36.0)
MCV: 102.3 fL — ABNORMAL HIGH (ref 80.0–100.0)
Platelets: 402 10*3/uL — ABNORMAL HIGH (ref 150–400)
RBC: 2.62 MIL/uL — ABNORMAL LOW (ref 3.87–5.11)
RDW: 15.8 % — ABNORMAL HIGH (ref 11.5–15.5)
WBC: 7.1 10*3/uL (ref 4.0–10.5)
nRBC: 0 % (ref 0.0–0.2)

## 2021-01-17 LAB — FOLATE: Folate: 15.7 ng/mL (ref >5.9)

## 2021-01-17 LAB — UREA NITROGEN, URINE: Urea Nitrogen, Ur: 475 mg/dL

## 2021-01-17 LAB — IRON AND TIBC
Iron: 31 ug/dL (ref 28–170)
Saturation Ratios: 11 % (ref 10.4–31.8)
TIBC: 286 ug/dL (ref 250–450)
UIBC: 255 ug/dL

## 2021-01-17 LAB — FERRITIN: Ferritin: 204 ng/mL (ref 11–307)

## 2021-01-17 MED ORDER — SODIUM CHLORIDE 0.9 % IV SOLN
250.0000 mg | Freq: Every day | INTRAVENOUS | Status: AC
  Administered 2021-01-17 – 2021-01-20 (×4): 250 mg via INTRAVENOUS
  Filled 2021-01-17 (×4): qty 20

## 2021-01-17 NOTE — Progress Notes (Signed)
PROGRESS NOTE   Subjective/Complaints: Wound vac can remain off since healing well! Advised patient that son can come in to learn dressing changes prior to d/c Monday- she will arrange for him to come in  ROS: Patient denies fever, rash, sore throat, blurred vision, nausea, vomiting, diarrhea, cough, shortness of breath or chest pain,  headache, or mood change.   Objective:   No results found. Recent Labs    01/17/21 0602  WBC 7.1  HGB 8.1*  HCT 26.8*  PLT 402*   Recent Labs    01/16/21 0756 01/17/21 0602  NA 136 135  K 4.6 4.4  CL 106 105  CO2 24 22  GLUCOSE 91 101*  BUN 26* 30*  CREATININE 3.61* 3.61*  CALCIUM 8.9 8.4*    Intake/Output Summary (Last 24 hours) at 01/17/2021 1400 Last data filed at 01/17/2021 1309 Gross per 24 hour  Intake 360 ml  Output 900 ml  Net -540 ml        Physical Exam: Vital Signs Blood pressure 118/71, pulse 86, temperature 98.4 F (36.9 C), temperature source Oral, resp. rate 18, height 5\' 8"  (1.727 m), weight (!) 170.7 kg, last menstrual period 03/26/2015, SpO2 99 %. Gen: no distress, normal appearing, BMI 170.7 HEENT: oral mucosa pink and moist, NCAT Cardio: Reg rate Chest: normal effort, normal rate of breathing Abd: soft, non-distended Ext: no edema Psych: pleasant, normal affect  Skin:    Comments: Wound VAC in place to left buttock abscess. sealed currently Neurological:    Comments: Patient is alert.  No acute distress.  Oriented x3. good bed mobility. Moves all 4's. Good bed mobility   Assessment/Plan: 1. Functional deficits which require 3+ hours per day of interdisciplinary therapy in a comprehensive inpatient rehab setting. Physiatrist is providing close team supervision and 24 hour management of active medical problems listed below. Physiatrist and rehab team continue to assess barriers to discharge/monitor patient progress toward functional and medical  goals  Care Tool:  Bathing    Body parts bathed by patient: Right arm, Chest, Left arm, Abdomen, Front perineal area, Right upper leg, Left upper leg, Face, Buttocks, Right lower leg, Left lower leg   Body parts bathed by helper: Buttocks     Bathing assist Assist Level: Supervision/Verbal cueing     Upper Body Dressing/Undressing Upper body dressing   What is the patient wearing?: Bra, Pull over shirt    Upper body assist Assist Level: Set up assist    Lower Body Dressing/Undressing Lower body dressing      What is the patient wearing?: Pants     Lower body assist Assist for lower body dressing: Minimal Assistance - Patient > 75%     Toileting Toileting    Toileting assist Assist for toileting: Supervision/Verbal cueing     Transfers Chair/bed transfer  Transfers assist  Chair/bed transfer activity did not occur: Safety/medical concerns (No bariatric WC available. Nausea)  Chair/bed transfer assist level: Supervision/Verbal cueing     Locomotion Ambulation   Ambulation assist   Ambulation activity did not occur: Safety/medical concerns (Nausea)  Assist level: Contact Guard/Touching assist Assistive device: Cane-straight Max distance: 154ft   Walk 10 feet activity  Assist  Walk 10 feet activity did not occur: Safety/medical concerns (Nausea)  Assist level: Contact Guard/Touching assist Assistive device: Cane-straight   Walk 50 feet activity   Assist Walk 50 feet with 2 turns activity did not occur: Safety/medical concerns (Nausea)  Assist level: Contact Guard/Touching assist Assistive device: Cane-straight    Walk 150 feet activity   Assist Walk 150 feet activity did not occur: Safety/medical concerns (Nausea)  Assist level: Contact Guard/Touching assist Assistive device: Cane-straight    Walk 10 feet on uneven surface  activity   Assist Walk 10 feet on uneven surfaces activity did not occur: Safety/medical concerns  (Nausea)         Wheelchair     Assist Will patient use wheelchair at discharge?: Yes Type of Wheelchair: Manual Wheelchair activity did not occur: Safety/medical concerns (No bariatric chair available)         Wheelchair 50 feet with 2 turns activity    Assist    Wheelchair 50 feet with 2 turns activity did not occur: Safety/medical concerns (No bariatric chair available)       Wheelchair 150 feet activity     Assist  Wheelchair 150 feet activity did not occur: Safety/medical concerns (No bariatric chair available)       Blood pressure 118/71, pulse 86, temperature 98.4 F (36.9 C), temperature source Oral, resp. rate 18, height 5\' 8"  (1.727 m), weight (!) 170.7 kg, last menstrual period 03/26/2015, SpO2 99 %.  Medical Problem List and Plan: 1.  Severe sepsis/septic shock secondary to necrotizing left gluteal wound.  Status post I&D wound debridement 01/04/2021 per Dr. 03/07/2021.  Follow-up general surgery.  Wound VAC changes as directed             -patient may not shower while wound vac in place             -ELOS/Goals: 2-3 weeks modI  -Continue CIR therapies including PT, OT, and SLP  2.  Impaired mobility, ambulating 160 feet: discontinue Lovenox             -antiplatelet therapy: N/A 3. Post-surgical pain: continue Oxycodone 5 to 10 mg every 4 hours as needed as well as 15 mg Monday Wednesday Friday with wound VAC changes as needed and Robaxin as needed- using regularly. 4. Mood: Provide emotional support             -antipsychotic agents: N/A 5. Neuropsych: This patient is capable of making decisions on her own behalf. 6. Skin/Wound Care: Routine skin checks  -vac changes per WOC team  MWF 7. Fluids/Electrolytes/Nutrition: encourage PO  -I personally reviewed the patient's labs today.   8.  ID.  Intravenous Zosyn 01/03/2021 and transitioned to Augmentin 01/10/2021. 9.  Diabetes mellitus.  Hemoglobin A1c 12.3.    CBG (last 3)  Recent Labs    01/16/21 2119  01/17/21 0601 01/17/21 1129  GLUCAP 117* 97 164*  -Continue Lantus 44U qam for now.  10.  AKI/multifactorial.  Follow-up nephrology services.  Renal ultrasound unremarkable. Appreciate renal f/u- repeat Cr tomorrow. Encouraged adequate hydration.  11.  Obesity.  BMI 60.14.  Dietary follow-up 12. Vitamin D deficiency: continue ergocalciferol 50,00U weekly for 7 weeks 13. Magnesium deficiency: magnesium 4gm IV 7/9, normal 7/10.  14. Hypokalemia: klor 9/9 on 7/9, normal on 7/10, 3.8 7/11 15. HTN: started amlodipine 5mg  daily--avoid HYPOtension   -reasonable control. Continue amlodipine to 2.5mg  qd 16. Urinary frequency/spasms: bothersome to her: urine cloudy and with increased WBC and rare bacteria: discussed with  pharmacy broadening abx to meropenum -ucx negative/<10k colonies, wbc's down to 11.9 -dc meropenum  17. Anemia: hgb holding at 8.4. MCV 100  -likely due chronic disease/illness. IV Ferrlicit 18. Thrombocytopenia: repeat CBC tomorrow to monitor.      LOS: 7 days A FACE TO FACE EVALUATION WAS PERFORMED  Pricila Bridge P Anupama Piehl 01/17/2021, 2:00 PM

## 2021-01-17 NOTE — Consult Note (Signed)
WOC Nurse wound follow up Patient receiving care in [facility] 5C09. Patient able to independently move from recliner to bed for left buttock wound viewing. The NPWT to the left buttock was discontinued yesterday and twice daily use of saline moistened gauze and ABD initiated.  I am pleased to say I do not see a decline in the appearance of the wound.  It remains with granulation tissue, no odor, and expected serosanginous drainage on the gauze removed from the wound bed.  I encouraged the patient to keep follow up appointments with her surgeon after discharged. She verbalized understanding of the importance of this on-going care.  Monitor the wound area(s) for worsening of condition such as: Signs/symptoms of infection,  Increase in size,  Development of or worsening of odor, Development of pain, or increased pain at the affected locations.  Notify the medical team if any of these develop.  Thank you for the consult.  Discussed plan of care with the patient and bedside nurse.  WOC nurse will not follow at this time.  Please re-consult the WOC team if needed.  Helmut Muster, RN, MSN, CWOCN, CNS-BC, pager [phone]

## 2021-01-17 NOTE — Plan of Care (Signed)
  Problem: RH Wheelchair Mobility Goal: LTG Patient will propel w/c in controlled environment (PT) Description: LTG: Patient will propel wheelchair in controlled environment, # of feet with assist (PT) Outcome: Not Applicable Flowsheets (Taken 01/17/2021 0746) LTG: Pt will propel w/c in controlled environ  assist needed:: (D/C) -- Note: D/C Goal: LTG Patient will propel w/c in home environment (PT) Description: LTG: Patient will propel wheelchair in home environment, # of feet with assistance (PT). Outcome: Not Applicable Flowsheets (Taken 01/17/2021 0746) LTG: Pt will propel w/c in home environ  assist needed:: (D/C) -- Note: D/C

## 2021-01-17 NOTE — Evaluation (Signed)
Recreational Therapy Assessment and Plan  Patient Details  Name: Erin Davila MRN: [id number] Date of Birth: [birth year] Today's Date: 01/17/2021  Rehab Potential:  Good ELOS:   d/c 7/17  Assessment  Hospital Problem: Active Problems:   Sepsis [facility])     Past Medical History:      Past Medical History:  Diagnosis Date   Allergy     Diabetes mellitus without complication ([name])     Hyperlipidemia     Hypertension      Past Surgical History:       Past Surgical History:  Procedure Laterality Date   CESAREAN SECTION       INCISION AND DRAINAGE ABSCESS N/A 01/04/2021    Procedure: INCISION AND DRAINAGE LEFT BUTTOCK ABSCESS;  Surgeon: Johnathan Hausen, MD;  Location: [facility];  Service: General;  Laterality: N/A;      Assessment & Plan Clinical Impression: Patient is a 60 y.o. year old female with with history of insulin-dependent diabetes mellitus hypertension morbid obesity with BMI 60.14, CKD with baseline creatinine 2.0.  Presented 01/03/2021 after a direct admission from general surgery outpatient office for evaluation of necrotizing wound on the left buttock.  Patient recently had incision and drainage for left gluteal abscess was taking home antibiotics.  On presentation to the office of general surgery wound was found to be necrotic with persistent drainage and therefore she was sent for direct admission.  Admission chemistry sodium 129, glucose 685, BUN 74, creatinine 4.47, WBC 15,500, hemoglobin A1c 12.8, SARS coronavirus negative.  Renal services consulted for AKI with baseline creatinine 2.0.  Renal ultrasound showed no obstruction.  A Foley catheter tube was placed.  Maintained on gentle IV fluids with latest creatinine 3.17.  General surgery continues to follow patient and she did undergo sharp debridement incision and drainage 01/04/2021 per Dr. Hassell Done.  Wound VAC was placed changed every Monday Wednesday Friday.  Patient remains on Zosyn for wound coverage.  Subcutaneous Lovenox  for DVT prophylaxis. Pt transferred to CIR.  Met with pt today to discuss leisure interests, activity analysis identifying potential adaptations and coping strategies. Pt presents with decreased activity tolerance, decreased functional mobility, decreased balance Limiting pt's independence with leisure/community pursuits.    Plan  Min 1 TR session >20 minutes during los  Recommendations for other services: None   Discharge Criteria: Patient will be discharged from TR if patient refuses treatment 3 consecutive times without medical reason.  If treatment goals not met, if there is a change in medical status, if patient makes no progress towards goals or if patient is discharged from hospital.  The above assessment, treatment plan, treatment alternatives and goals were discussed and mutually agreed upon: by patient  [place] 01/17/2021, 3:05 PM

## 2021-01-17 NOTE — Progress Notes (Signed)
Slept fairly well throughout the night. Medicated x1 for reports of pain to left buttock surgical area-partial effects noted. Wound care performed as ordered, pt abe to tolerate without difficulty.

## 2021-01-17 NOTE — Progress Notes (Signed)
Occupational Therapy Session Note  Patient Details  Name: AUGUSTINA BRADDOCK MRN: [id number] Date of Birth: 1961/01/10  Today's Date: 01/17/2021 OT Group Time: 1330-1430 OT Group Time Calculation (min): 60 min   Short Term Goals: Week 1:  OT Short Term Goal 1 (Week 1): STG = LTGs due to ELOS   Skilled Therapeutic Interventions/Progress Updates:    Pt transported to/from room <> ortho gym via RT, no pain reported throughout group session. Focus of session on community reintegration, socialization, problem solving, anticipatory awareness, and functional mobility using a SPC. Pt actively participating in conversational dialogue to plan and prepare for community outings for DME needs, accessibility, time of day, assistance needed via spouse/others, bathroom accessibility, etc. Pt ambulating CGA throughout session with SPC and good safety awareness noted throughout obstacle course and maneuvering up/down ramp. Transported back to room via rehab tech.    Therapy Documentation Precautions:  Precautions Precautions: None Precaution Comments: wound vac in place on L buttocks Restrictions Weight Bearing Restrictions: No      Therapy/Group: Group Therapy  Erasmo Score 01/17/2021, 3:54 PM

## 2021-01-17 NOTE — Progress Notes (Addendum)
[facility] Resident Progress Note   Subjective:  HD 7 Ms Probert reports doing well this morning. She is sitting up in bedside chair and doing cross word puzzle. She reports hydrating well and denies any urinary complaints at this time. No acute concerns   Objective Vitals:   01/16/21 0538 01/16/21 1351 01/16/21 1958 01/17/21 0532  BP: (!) 145/75 118/68 139/86 (!) 155/79  Pulse: 84 86 90 99  Resp: 17 18 18 20   Temp: 98.9 F (37.2 C) 98 F (36.7 C) 98.2 F (36.8 C) 99.2 F (37.3 C)  TempSrc: Oral Oral Oral Oral  SpO2: 96% 100% 100% 100%  Weight:      Height:       Physical Exam General: obese middle aged female, sitting up in bedside chair, no acute distress Heart: s1 and s2 present, regular rate and rhythm Lungs: clear to auscultation bilaterally, on room air  Abdomen: obese abdomen, soft, nontender, +BS Extremities: warm and dry, trace pitting edema of bilat lower extremities; distal pulses intact   Additional Objective Labs: Basic Metabolic Panel: Recent Labs  Lab 01/14/21 0725 01/16/21 0756 01/17/21 0602  NA 134* 136 135  K 3.8 4.6 4.4  CL 101 106 105  CO2 25 24 22   GLUCOSE 137* 91 101*  BUN 22* 26* 30*  CREATININE 3.23* 3.61* 3.61*  CALCIUM 8.4* 8.9 8.4*  PHOS  --   --  5.0*   Liver Function Tests: Recent Labs  Lab 01/11/21 0740 01/17/21 0602  AST 26  --   ALT 17  --   ALKPHOS 111  --   BILITOT 0.9  --   PROT 6.7  --   ALBUMIN 2.0* 2.3*   No results for input(s): LIPASE, AMYLASE in the last 168 hours. CBC: Recent Labs  Lab 01/11/21 0740 01/14/21 0725 01/17/21 0602  WBC 11.9* 7.8 7.1  NEUTROABS 7.5 4.2  --   HGB 8.5* 8.4* 8.1*  HCT 26.3* 27.0* 26.8*  MCV 98.5 101.5* 102.3*  PLT 436* 452* 402*   Blood Culture    Component Value Date/Time   SDES URINE, RANDOM 01/12/2021 1816   SPECREQUEST NONE 01/12/2021 1816   CULT (A) 01/12/2021 1816    <10,000 COLONIES/mL INSIGNIFICANT GROWTH Performed at [facility],  [address] [place], [address] [place]    REPTSTATUS 01/14/2021 FINAL 01/12/2021 1816    Cardiac Enzymes: No results for input(s): CKTOTAL, CKMB, CKMBINDEX, TROPONINI in the last 168 hours. CBG: Recent Labs  Lab 01/16/21 0536 01/16/21 1146 01/16/21 1700 01/16/21 2119 01/17/21 0601  GLUCAP 85 133* 75 117* 97    Medications:   amLODipine  2.5 mg Oral Daily   enoxaparin (LOVENOX) injection  30 mg Subcutaneous Q12H   insulin aspart  0-20 Units Subcutaneous TID WC   insulin glargine  44 Units Subcutaneous Daily   oxyCODONE  15 mg Oral Once per day on Mon Wed Fri   Vitamin D (Ergocalciferol)  50,000 Units Oral Q7 days    Assessment/Plan: Daveigh Batty is a 60 year old female with PMHx of type II DM, hypertension, severe obesity and CKD admitted for wound management and AKI.   Nonoligouric AKI on CKD: Baseline sCr 2.0; recently stable 3.2-3.3; however, noted increase to 3.61 in setting of episode of hypotension on 7/11. FeNa consistent with pre-renal etiology. sCr stable this AM at 3.61. Patient encouraged to maintain PO intake. Suspect she will continue to have slow recovery of renal function; would like to avoid hypotensive episodes as  able.  Left gluteal wound s/p I&D and antibiotics: Wound vac d/c'd 7/13 per WOC. She has completed course of antibiotics. Continue to monitor   Type II diabetes mellitus: CBG's at goal of <180 to promote healing of wound.  Hypertension: Currently normotensive. Had one episode of hypotension on 7/11, hold parameters for amlodipine for SBP<110.  Macrocytic anemia, previously normocytic anemia: Decreased iron sats; will give IV Ferrlicit.  Thrombocytosis: likely reactive in setting of infection, improving. Continue to monitor   Eliezer Bottom, MD Internal Medicine, PGY-3 01/17/21 8:06 AM Pager # [phone]

## 2021-01-17 NOTE — Progress Notes (Signed)
Physical Therapy Session Note  Patient Details  Name: Erin Davila MRN: [id number] Date of Birth: [birth year]  Today's Date: 01/17/2021 PT Individual Time: 5997-7414 PT Individual Time Calculation (min): 54 min   Short Term Goals: Week 1:  PT Short Term Goal 1 (Week 1): STG = LTG due to LOS  Skilled Therapeutic Interventions/Progress Updates:  Received pt sitting in in [facility] in ortho gym finishing group therapy session. Session with emphasis on functional mobility/transfers, generalized strengthening, dynamic standing balance/coordination, gait training, and improved activity tolerance. Pt transported to dayroom in [facility] total A and transferred stand<>pivot WC<>mat without AD and supervision. Pt ambulated 153ft with SPC and close supervision with 3 standing rest breaks at railing due to fatigue and LE weakness. Sit<>stand without AD and supervision and performed mini-squats 1x4 and 1x5 to fatigue. Pt worked on BLE strengthening and core strengthening pedaling on portable exercise bike for 1 minute; stopped due to increased pressure/discomfort on buttocks. Therefore, transitioned to standing BUE strengthening on portable exercise bike with bilateral 1lb wrist weights for 1 minute forward and 1 minute backwards with 1 seated rest break in between. Then worked on dynamic standing balance performing alternating toe taps to 3in step using SPC and CGA 2x8 with 1 minor LOB but pt able to self-correct. Stand<>pivot mat<>WC without AD and supervision. Pt transported back to room in [facility] total A and reported urge to urinate. Ambulated to bedside commode without AD and CGA and pt able to manage clothing, void, and perform hygiene management with supervision. Concluded session with pt sitting in recliner with all needs within reach.   Therapy Documentation Precautions:  Precautions Precautions: None Precaution Comments: wound vac in place on L buttocks Restrictions Weight Bearing Restrictions:  No   Therapy/Group: Individual Therapy Martin Majestic PT, DPT   01/17/2021, 7:48 AM

## 2021-01-17 NOTE — Progress Notes (Signed)
Recreational Therapy Session Note  Patient Details  Name: Erin Davila MRN: [id number] Date of Birth: 11/28/1960 Today's Date: 01/17/2021 Time:  1330-1430 Pain: no c/o  . Skilled Therapeutic Interventions/Progress Updates: Pt participated in community skills/education group with an emphasis on community reintegration.  Discussion/education included identifying potential obstacles (physical and attitudinal), problem solving through solutions, fall recovery, public restroom access .  Pt ambulatied up/down a ramp and throughout an obstacle course with SPC and contact guard assist.  Therapy/Group: Group Therapy   Antaeus Karel 01/17/2021, 3:08 PM

## 2021-01-17 NOTE — Progress Notes (Signed)
Occupational Therapy Session Note  Patient Details  Name: Erin Davila MRN: [id number] Date of Birth: March 01, 1961  Today's Date: 01/17/2021 OT Individual Time: 1010-1115 OT Individual Time Calculation (min): 65 min    Short Term Goals: Week 1:  OT Short Term Goal 1 (Week 1): STG = LTGs due to ELOS  Skilled Therapeutic Interventions/Progress Updates:    Pt received in room and consented to OT tx. Pt requested therapist come back in 10 mins to wait for pain meds to kick in as she just got them. Therapist exited and came back 10 mins later, pt donned socks and shoes with setup and time, however req min A to tie tennis shoes. Pt completed SPT from recliner to w/c with no AD and CGA. Pt wheeled down to therapy gym for time mgmt, instructed in standing tolerance activity at elevated table. Pt instructed in 24 piece jigsaw puzzle to increase standing tolerance, gross and FMC, and problem solving for ADLs and IADLs. Pt req multiple seated rest breaks due to fatigue. After pt completed standing activity, pt instructed in BUE strengthening HEP to increase strength and activity tolerance for ADLs. Pt instructed in 2#db exercises including elbow flexion, chest press and shoulder flexion all for 2x15 with min cuing for proper tech with good carryover. Pt then taken back to room and helped to bari [facility] with CGA, toileting completed with SUP., After tx, pt left sitting up in recliner with all needs met.   Therapy Documentation Precautions:  Precautions Precautions: None Precaution Comments: wound vac in place on L buttocks Restrictions Weight Bearing Restrictions: No  Vital Signs: Therapy Vitals Temp: 99.2 F (37.3 C) Temp Source: Oral Pulse Rate: 99 Resp: 20 BP: (!) 155/79 Patient Position (if appropriate): Sitting Oxygen Therapy SpO2: 100 % O2 Device: Room Air Pain: 6/10     Therapy/Group: Individual Therapy  Faten Frieson 01/17/2021, 7:49 AM

## 2021-01-18 LAB — CBC WITH DIFFERENTIAL/PLATELET
Abs Immature Granulocytes: 0.01 10*3/uL (ref 0.00–0.07)
Basophils Absolute: 0 10*3/uL (ref 0.0–0.1)
Basophils Relative: 1 %
Eosinophils Absolute: 0.4 10*3/uL (ref 0.0–0.5)
Eosinophils Relative: 6 %
HCT: 24.6 % — ABNORMAL LOW (ref 36.0–46.0)
Hemoglobin: 7.7 g/dL — ABNORMAL LOW (ref 12.0–15.0)
Immature Granulocytes: 0 %
Lymphocytes Relative: 21 %
Lymphs Abs: 1.5 10*3/uL (ref 0.7–4.0)
MCH: 32 pg (ref 26.0–34.0)
MCHC: 31.3 g/dL (ref 30.0–36.0)
MCV: 102.1 fL — ABNORMAL HIGH (ref 80.0–100.0)
Monocytes Absolute: 1 10*3/uL (ref 0.1–1.0)
Monocytes Relative: 15 %
Neutro Abs: 3.9 10*3/uL (ref 1.7–7.7)
Neutrophils Relative %: 57 %
Platelets: 368 10*3/uL (ref 150–400)
RBC: 2.41 MIL/uL — ABNORMAL LOW (ref 3.87–5.11)
RDW: 15.7 % — ABNORMAL HIGH (ref 11.5–15.5)
WBC: 6.9 10*3/uL (ref 4.0–10.5)
nRBC: 0 % (ref 0.0–0.2)

## 2021-01-18 LAB — BASIC METABOLIC PANEL
Anion gap: 8 (ref 5–15)
BUN: 30 mg/dL — ABNORMAL HIGH (ref 6–20)
CO2: 24 mmol/L (ref 22–32)
Calcium: 8.9 mg/dL (ref 8.9–10.3)
Chloride: 103 mmol/L (ref 98–111)
Creatinine, Ser: 3.39 mg/dL — ABNORMAL HIGH (ref 0.44–1.00)
GFR, Estimated: 15 mL/min — ABNORMAL LOW (ref >60)
Glucose, Bld: 92 mg/dL (ref 70–99)
Potassium: 4.4 mmol/L (ref 3.5–5.1)
Sodium: 135 mmol/L (ref 135–145)

## 2021-01-18 LAB — GLUCOSE, CAPILLARY
Glucose-Capillary: 94 mg/dL (ref 70–99)
Glucose-Capillary: 140 mg/dL — ABNORMAL HIGH (ref 70–99)
Glucose-Capillary: 151 mg/dL — ABNORMAL HIGH (ref 70–99)
Glucose-Capillary: 170 mg/dL — ABNORMAL HIGH (ref 70–99)

## 2021-01-18 MED ORDER — ENOXAPARIN SODIUM 40 MG/0.4ML IJ SOSY
40.0000 mg | PREFILLED_SYRINGE | INTRAMUSCULAR | Status: DC
  Administered 2021-01-19 – 2021-01-21 (×3): 40 mg via SUBCUTANEOUS
  Filled 2021-01-18 (×3): qty 0.4

## 2021-01-18 MED ORDER — PANTOPRAZOLE SODIUM 40 MG PO TBEC
40.0000 mg | DELAYED_RELEASE_TABLET | Freq: Every day | ORAL | Status: DC
  Administered 2021-01-18 – 2021-01-21 (×4): 40 mg via ORAL
  Filled 2021-01-18 (×4): qty 1

## 2021-01-18 NOTE — Progress Notes (Addendum)
Patient ID: Erin Davila, female   DOB: [birth year], 60 y.o.   MRN: [id number] Continue to try to find home health agency to take her [employer]. Made pt aware she may not have home health at discharge and son would need to do dressing changes until follows up with surgeon and wound clinic appointments.  3:00 Pm Thought had found HH agency to take pt but their RN is ill and can not cover their existing pt. Informed pt of this after told her had an agency to take her with co-pays 50-60 dollars per visit. Continue to try to found agency to take [employer]. RN shortage at this time for home health.

## 2021-01-18 NOTE — Progress Notes (Signed)
Physical Therapy Session Note  Patient Details  Name: Erin Davila MRN: [id number] Date of Birth: February 19, 1961  Today's Date: 01/18/2021 PT Individual Time: 1301-1430 PT Individual Time Calculation (min): 89 min   Short Term Goals: Week 1:  PT Short Term Goal 1 (Week 1): STG = LTG due to LOS  Skilled Therapeutic Interventions/Progress Updates:   Received pt sitting in recliner finishing lunch, pt agreeable to PT treatment, and denied any pain during session. Pt connected to IV for Iron but unable to be disconnected for therapy session. Pt requested to order dinner prior to leaving room; therapist assisted while pt used commode. Session with emphasis on functional mobility/transfers, generalized strengthening, dynamic standing balance/coordination, gait training, and improved activity tolerance. Stand<>pivot recliner<>bedside commode without AD and close supervision and pt able to manage clothing, void, and perform peri-care with supervision. Pt transferred to 4W therapy gym in [facility] total A for time management purposes. Pt ambulated 173ft x 3 trials with SPC and supervision with total A to manage IV pole (with 4 standing rest breaks). Pt demonstrates wide BOS, decreased bilateral foot clearance, and decreased cadence with gait and required multiple rest breaks throughout session due to fatigue. Noticed increased bilateral LE edema and donned ted hose with total A. Pt then performed the following exercises sitting on mat with supervision and verbal cues for technique: -hip flexion with 1.5lb ankle weight 2x20 bilaterally -LAQ with 1.5lb ankle weight 2x20 bilaterally  -hip adduction ball squeezes x12 with 5 second isometric hold  Provided pt with HEP consisting of the following exercises and educated pt on frequency/duration/technique: -Seated March - 1 x daily - 7 x weekly - 3 sets - 20 reps -Seated Long Arc Quad - 1 x daily - 7 x weekly - 3 sets - 20 reps -Seated Hip Adduction Isometrics with Ball -  1 x daily - 7 x weekly - 3 sets - 10 reps -Standing March with Counter Support - 1 x daily - 7 x weekly - 2 sets - 10 reps -Standing Hip Abduction with Counter Support - 1 x daily - 7 x weekly - 2 sets - 10 reps -Standing Heel Raise with Support - 1 x daily - 7 x weekly - 3 sets - 10 reps Stand<>pivot mat<>WC without AD and supervision and transported to dayroom in [facility] total A. Concluded session with pt sitting in WC, handoff to OT.   Therapy Documentation Precautions:  Precautions Precautions: None Precaution Comments: wound vac in place on L buttocks Restrictions Weight Bearing Restrictions: No  Therapy/Group: Individual Therapy Martin Majestic PT, DPT   01/18/2021, 7:24 AM

## 2021-01-18 NOTE — Progress Notes (Signed)
[facility] Resident Progress Note   Subjective:  HD 8 Erin Davila reports doing well this morning. She is lying in bed and reports that she is scheduled for wound care soon and will work with physical therapy after that. Denies any acute concerns at this time.   Objective Vitals:   01/17/21 0532 01/17/21 1312 01/17/21 1925 01/18/21 0524  BP: (!) 155/79 118/71 140/69 (!) 154/73  Pulse: 99 86 94 95  Resp: 20 18 18 18   Temp: 99.2 F (37.3 C) 98.4 F (36.9 C) 98.8 F (37.1 C) 99 F (37.2 C)  TempSrc: Oral Oral Oral Oral  SpO2: 100% 99% 100% 99%  Weight:      Height:       Physical Exam General: obese middle aged female, no acute distress Heart: s1 and s2 present, regular rate and rhythm Lungs: clear to auscultation bilaterally, on room air  Abdomen: obese abdomen, soft, nontender, +BS Extremities: warm and dry, no pitting edema, distal pulses intact   Additional Objective Labs: Basic Metabolic Panel: Recent Labs  Lab 01/16/21 0756 01/17/21 0602 01/18/21 0643  NA 136 135 135  K 4.6 4.4 4.4  CL 106 105 103  CO2 24 22 24   GLUCOSE 91 101* 92  BUN 26* 30* 30*  CREATININE 3.61* 3.61* 3.39*  CALCIUM 8.9 8.4* 8.9  PHOS  --  5.0*  --    Liver Function Tests: Recent Labs  Lab 01/17/21 0602  ALBUMIN 2.3*   No results for input(s): LIPASE, AMYLASE in the last 168 hours. CBC: Recent Labs  Lab 01/14/21 0725 01/17/21 0602 01/18/21 0643  WBC 7.8 7.1 6.9  NEUTROABS 4.2  --  3.9  HGB 8.4* 8.1* 7.7*  HCT 27.0* 26.8* 24.6*  MCV 101.5* 102.3* 102.1*  PLT 452* 402* 368   Blood Culture    Component Value Date/Time   SDES URINE, RANDOM 01/12/2021 1816   SPECREQUEST NONE 01/12/2021 1816   CULT (A) 01/12/2021 1816    <10,000 COLONIES/mL INSIGNIFICANT GROWTH Performed at [facility], [address], [place], [address] [place]    REPTSTATUS 01/14/2021 FINAL 01/12/2021 1816    Cardiac Enzymes: No results for input(s): CKTOTAL, CKMB, CKMBINDEX,  TROPONINI in the last 168 hours. CBG: Recent Labs  Lab 01/17/21 0601 01/17/21 1129 01/17/21 1653 01/17/21 2103 01/18/21 0606  GLUCAP 97 164* 122* 143* 94    Medications:  ferric gluconate (FERRLECIT) IVPB Stopped (01/17/21 1300)    amLODipine  2.5 mg Oral Daily   enoxaparin (LOVENOX) injection  30 mg Subcutaneous Q12H   insulin aspart  0-20 Units Subcutaneous TID WC   insulin glargine  44 Units Subcutaneous Daily   oxyCODONE  15 mg Oral Once per day on Mon Wed Fri   pantoprazole  40 mg Oral Daily   Vitamin D (Ergocalciferol)  50,000 Units Oral Q7 days    Assessment/Plan: Erin Davila is a 60 year old female with PMHx of type II DM, hypertension, severe obesity and CKD admitted for wound management and AKI.   Nonoligouric AKI on CKD: Baseline sCr 2.0; recently stable 3.2-3.3; however, noted increase to 3.61 in setting of episode of hypotension on 7/11. FeNa consistent with pre-renal etiology. sCr slightly improved to 3.39 this morning. 975cc UOP over past 24 hrs. Patient encouraged to maintain PO intake. Would like to avoid hypotensive episodes as able. Continue to periodically monitor renal function; suspect her renal function recovery will be a slow process. No further interventions recommended at this time. Patient to  follow up with nephrology as outpatient in one month.  Left gluteal wound s/p I&D and antibiotics: Wound vac d/c'd 7/13 per WOC. She has completed course of antibiotics. Continue wound care  Type II diabetes mellitus: CBG's at goal of <180 to promote healing of wound.  Hypertension: Continue amlodipine with hold parameters SBP<110.  Macrocytic anemia, previously normocytic anemia: Decreased iron sats; will receive four doses of IV Ferrlicit.  Thrombocytosis: resolved   Eliezer Bottom, MD Internal Medicine, PGY-3 01/18/21 8:10 AM Pager # [phone]

## 2021-01-18 NOTE — Progress Notes (Signed)
Patient slept well throughout the night. PRN oxycodone given x1 for reports of pain to buttocks-part effective. Wound care performed this shift, tolerated well. No issues noted throughout the night.

## 2021-01-18 NOTE — Progress Notes (Signed)
Occupational Therapy Session Note  Patient Details  Name: Erin Davila MRN: [id number] Date of Birth: [birth year]  Today's Date: 01/18/2021 OT Individual Time: 1023-1130 OT Individual Time Calculation (min): 67 min    Short Term Goals: Week 1:  OT Short Term Goal 1 (Week 1): STG = LTGs due to ELOS  Skilled Therapeutic Interventions/Progress Updates:  Pt received from  morning OT finishing up bathing and dressing, with pt agreeable to session. 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 Pt currently requires gross supervision for function mobility with no AD. 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 Pt reports conductor area is accessible for pt ( appropriate chair, with area to push up <>down from on both sides, 2 steps to enter conductor area with hand rail on R side). Pt able to demo conducting her choir with chair, music stand and area set- up to simulate environment. Pt transported to ADL apartment with pt able to complete light housekeeping task of putting sheets on bed with overall supervision assist, discussed taking breaks as necessary and various energy conservation strategies. Pt transported back to room with pt left up in recliner with all needs within reach.    Therapy Documentation Precautions:  Precautions Precautions: None Precaution Comments: wound on buttocks Restrictions Weight Bearing Restrictions: No General:   Vital Signs:  Pain: Pt reports no pain during session.    Therapy/Group: Individual Therapy  Pollyann Glen [facility] 01/18/2021, 12:18 PM

## 2021-01-18 NOTE — Progress Notes (Signed)
Occupational Therapy Session Note  Patient Details  Name: Erin Davila MRN: [id number] Date of Birth: 12-06-1960  Today's Date: 01/18/2021 OT Individual Time: 1430-1515 OT Individual Time Calculation (min): 45 min    Short Term Goals: Week 1:  OT Short Term Goal 1 (Week 1): STG = LTGs due to ELOS  Skilled Therapeutic Interventions/Progress Updates:     Pt received in bed with unrated pain in back. Repositioning and rest provided for pain  Therapeutic activity Pt completes ambulatory transfer with [facility] to gather towels, transport to washing room, transfer from washer>dryer>table for folding for simulated laundry task with CGA and A to manage IV pole. Pt with no LOB however fatigued after unloading from dryer with leaning rest break on dryer. Pt completes folding with 1# wrist weights for BUE strengthening required.  Pt completes 3x1 min beach ball volley in seated position with 4 # dowel rod for dynamic balance, postural control, BUE strengthening and endurance required for BADLs and functional transfers.    Pt left at end of session in bed with exit alarm on, call light in reach and all needs met   Therapy Documentation Precautions:  Precautions Precautions: None Precaution Comments: wound vac in place on L buttocks Restrictions Weight Bearing Restrictions: No General:   Vital Signs: Therapy Vitals Temp: 99 F (37.2 C) Temp Source: Oral Pulse Rate: 95 Resp: 18 BP: (!) 154/73 Patient Position (if appropriate): Lying Oxygen Therapy SpO2: 99 % O2 Device: Room Air Pain:   ADL: ADL Upper Body Bathing: Setup Where Assessed-Upper Body Bathing: Edge of bed Lower Body Bathing: Minimal assistance Where Assessed-Lower Body Bathing: Edge of bed Upper Body Dressing: Setup Where Assessed-Upper Body Dressing: Edge of bed Lower Body Dressing: Minimal assistance Where Assessed-Lower Body Dressing: Edge of bed Toileting: Moderate assistance Where Assessed-Toileting: Bedside  Commode Toilet Transfer: Contact guard Toilet Transfer Method: Stand pivot Science writer: Extra wide drop arm bedside commode Vision   Perception    Praxis   Exercises:   Other Treatments:     Therapy/Group: Individual Therapy  Tonny Branch 01/18/2021, 6:55 AM

## 2021-01-18 NOTE — Progress Notes (Signed)
Recreational Therapy Discharge Summary Patient Details  Name: LYLIA KARN MRN: [id number] Date of Birth: June 09, 1961 Today's Date: 01/18/2021  Long term goals set: 1  Long term goals met: 1  Comments on progress toward goals: TR sessions/education focused on leisure education, activity analysis/identification of adaptations, coping strategies and community reintegration.  Pt participated in community skills group at contact guard ambulatory level using SPC.  Pt is excited about upcoming discharge 7/20 and is confident in her ability to return to previously enjoyed leisure activities with minimal modifications.  Goal met.  Reasons goals not met: n/a  Equipment acquired: n/a  Reasons for discharge: discharge from hospital   Patient/family agrees with progress made and goals achieved: Yes  Dionne Knoop 01/18/2021, 9:43 AM

## 2021-01-18 NOTE — Plan of Care (Signed)
  Problem: Consults Goal: RH GENERAL PATIENT EDUCATION Description: See Patient Education module for education specifics. Outcome: Progressing   Problem: RH BOWEL ELIMINATION Goal: RH STG MANAGE BOWEL WITH ASSISTANCE Description: STG Manage Bowel with mod I Assistance. Outcome: Progressing Goal: RH STG MANAGE BOWEL W/MEDICATION W/ASSISTANCE Description: STG Manage Bowel with Medication with mod I Assistance. Outcome: Progressing   Problem: RH BLADDER ELIMINATION Goal: RH STG MANAGE BLADDER WITH EQUIPMENT WITH ASSISTANCE Description: STG Manage Bladder With Equipment With min Assistance Outcome: Progressing   Problem: RH SKIN INTEGRITY Goal: RH STG SKIN FREE OF INFECTION/BREAKDOWN Description: Manage skin care with min assist Outcome: Progressing Goal: RH STG MAINTAIN SKIN INTEGRITY WITH ASSISTANCE Description: STG Maintain Skin Integrity With min Assistance. Outcome: Progressing Goal: RH STG ABLE TO PERFORM INCISION/WOUND CARE W/ASSISTANCE Description: STG Able To Perform Incision/Wound Care With min Assistance. Outcome: Progressing   Problem: RH SAFETY Goal: RH STG ADHERE TO SAFETY PRECAUTIONS W/ASSISTANCE/DEVICE Description: STG Adhere to Safety Precautions With cues/reminders Assistance/Device. Outcome: Progressing   Problem: RH PAIN MANAGEMENT Goal: RH STG PAIN MANAGED AT OR BELOW PT'S PAIN GOAL Description: At or below level 4 Outcome: Progressing   Problem: RH KNOWLEDGE DEFICIT GENERAL Goal: RH STG INCREASE KNOWLEDGE OF SELF CARE AFTER HOSPITALIZATION Description: Patient will be able to manage self care using handouts and educational information independently and direct others (min assist) to complete care as needed Outcome: Progressing

## 2021-01-18 NOTE — Progress Notes (Signed)
Occupational Therapy Session Note  Patient Details  Name: Erin Davila MRN: [id number] Date of Birth: 1960/11/28  Today's Date: 01/18/2021 OT Individual Time: 0932-1030 OT Individual Time Calculation (min): 58 min    Short Term Goals: Week 1:  OT Short Term Goal 1 (Week 1): STG = LTGs due to ELOS  Skilled Therapeutic Interventions/Progress Updates:    Treatment session with focus on self-care retraining and d/c planning.  Pt received supine in bed with RN present discussing need for education with son for wound care.  Pt ambulated around room to gather clothing and items for bathing/dressing with distant supervision.  Pt completed bathing and dressing from EOB with lateral leans to wash her buttocks.  Pt reports need to toilet. Completed ambulatory transfer to [facility] without AD with supervision. Pt completed hygine but then asked for assistance for thoroughness. Pt returned to EOB to complete bathing and dressing.  Pt donned pants, socks, and shoes without any assistance this session.  Pt completed oral care seated at EOB for energy conservation.  Pt pleased with progress.  Pt passed off to COTA and TR for pt's next session.  Therapy Documentation Precautions:  Precautions Precautions: None Precaution Comments: wound on buttocks Restrictions Weight Bearing Restrictions: No  Pain:  Pt with no c/o pain  Therapy/Group: Individual Therapy  Rosalio Loud 01/18/2021, 10:31 AM

## 2021-01-18 NOTE — Progress Notes (Signed)
Physical Therapy Discharge Summary  Patient Details  Name: Erin Davila MRN: [id number] Date of Birth: 1961/05/10  Patient has met 7 of 9 long term goals due to improved activity tolerance, improved balance, improved postural control, increased strength, decreased pain, and improved coordination. Patient to discharge at an ambulatory level Modified Independent.    Reasons goals not met: Pt did not meet bed mobility goal of independent as pt is currently mod I due to need for increased time and reliance on using bedrails due to weakness. Pt also did not meet car transfer goal of Mod I as she is currently requires supervision due to technique she prefers to use for entry.    Recommendation:  Patient will benefit from ongoing skilled PT services in home health setting to continue to advance safe functional mobility, address ongoing impairments in generalized strengthening, dynamic standing balance/coordination, gait training, NMR, endurance, and to minimize fall risk.  Equipment: SPC  Reasons for discharge: treatment goals met  Patient/family agrees with progress made and goals achieved: Yes  PT Discharge Precautions/Restrictions Precautions Precautions: None Precaution Comments: wound on buttocks Restrictions Weight Bearing Restrictions: No Cognition Overall Cognitive Status: Within Functional Limits for tasks assessed Arousal/Alertness: Awake/alert Orientation Level: Oriented X4 Memory: Appears intact Awareness: Appears intact Problem Solving: Appears intact Safety/Judgment: Appears intact Sensation Sensation Light Touch: Appears Intact Proprioception: Appears Intact Coordination Gross Motor Movements are Fluid and Coordinated: Yes (generalized weakness and deconditioning) Fine Motor Movements are Fluid and Coordinated: Yes Finger Nose Finger Test: WFL bilaterally Heel Shin Test: decreased ROM due to body habitus Motor  Motor Motor: Within Functional Limits Motor -  Skilled Clinical Observations: generalized weakness and deconditioning  Mobility Bed Mobility Bed Mobility: Rolling Right;Rolling Left;Sit to Supine;Supine to Sit Rolling Right: Independent with assistive device Rolling Left: Independent with assistive device Supine to Sit: Independent with assistive device Sit to Supine: Independent with assistive device Transfers Transfers: Sit to Stand;Stand to Sit;Stand Pivot Transfers Sit to Stand: Independent with assistive device Stand to Sit: Independent with assistive device Stand Pivot Transfers: Independent with assistive device Transfer (Assistive device): Straight cane Locomotion  Gait Ambulation: Yes Gait Assistance: Independent with assistive device Gait Distance (Feet): 100 Feet Assistive device: Straight cane Gait Gait: Yes Gait Pattern: Impaired Gait Pattern: Step-to pattern;Decreased step length - right;Decreased step length - left;Decreased stride length;Poor foot clearance - right;Poor foot clearance - left;Wide base of support;Decreased trunk rotation Gait velocity: decreased Stairs / Additional Locomotion Stairs: Yes Stairs Assistance: Supervision/Verbal cueing Stair Management Technique: One rail Right;With cane Number of Stairs: 12 Height of Stairs: 6 Ramp: Supervision/Verbal cueing [facility]) Wheelchair Mobility Wheelchair Mobility: No  Trunk/Postural Assessment  Cervical Assessment Cervical Assessment: Exceptions to [facility] (forward head) Thoracic Assessment Thoracic Assessment: Exceptions to [facility] (mild kyphosis) Lumbar Assessment Lumbar Assessment: Exceptions to [facility] (posterior pelvic tilt) Postural Control Postural Control: Within Functional Limits  Balance Balance Balance Assessed: Yes Static Sitting Balance Static Sitting - Balance Support: Feet supported;Bilateral upper extremity supported Static Sitting - Level of Assistance: 7: Independent Dynamic Sitting Balance Dynamic Sitting - Balance Support: Feet  supported;No upper extremity supported Dynamic Sitting - Level of Assistance: 7: Independent Static Standing Balance Static Standing - Balance Support: Right upper extremity supported (SPC) Static Standing - Level of Assistance: 6: Modified independent (Device/Increase time) Dynamic Standing Balance Dynamic Standing - Balance Support: Right upper extremity supported (SPC) Dynamic Standing - Level of Assistance: 6: Modified independent (Device/Increase time) Dynamic Standing - Comments: with transfers and gait Extremity Assessment  RLE Assessment RLE  Assessment: Exceptions to [facility] RLE Strength Right Hip Flexion: 4-/5 (limited ROM due to body habitus) Right Hip ABduction: 4-/5 Right Hip ADduction: 4-/5 Right Knee Flexion: 4+/5 Right Knee Extension: 4+/5 Right Ankle Dorsiflexion: 4+/5 Right Ankle Plantar Flexion: 4+/5 LLE Assessment LLE Assessment: Exceptions to [facility] LLE Strength Left Hip Flexion: 4-/5 (limited ROM due to body habitus) Left Hip ABduction: 4-/5 Left Hip ADduction: 4-/5 Left Knee Flexion: 4+/5 Left Knee Extension: 4+/5 Left Ankle Dorsiflexion: 4+/5 Left Ankle Plantar Flexion: 4+/5  Alfonse Alpers PT, DPT  01/18/2021, 7:56 AM

## 2021-01-18 NOTE — Progress Notes (Signed)
Occupational Therapy Discharge Summary  Patient Details  Name: Erin Davila MRN: [id number] Date of Birth: 1961-01-20   Patient has met 6 of 9 long term goals due to improved activity tolerance, improved balance, postural control, and ability to compensate for deficits.  Patient to discharge at overall Modified Independent level.  Patient's care partner is independent to provide the necessary  intermittent  assistance at discharge.  Patient to discharge home with son who will be staying with her the next few weeks until he returns to college.  Pt still needs assistance with posterior perihygiene during bathing + toileting tasks, therefore these goals were unable to be met. Per pt, her son can provide this needed assistance at d/c, also requires use of AD to safely retrieve her clothing  Recommendation:  Patient will benefit from ongoing skilled OT services in home health setting to continue to advance functional skills in the area of BADL and Reduce care partner burden.  Equipment: Bariatric tub transfer bench  Reasons for discharge: treatment goals met and discharge from hospital  Patient/family agrees with progress made and goals achieved: Yes  OT Discharge Precautions/Restrictions  Precautions Precautions: None Precaution Comments: wound on buttocks Restrictions Weight Bearing Restrictions: No  Pain Pain Assessment Pain Scale: 0-10 Pain Score: 0-No pain ADL ADL Eating: Independent Where Assessed-Eating: Chair Grooming: Modified independent Where Assessed-Grooming: Edge of bed Upper Body Bathing: Setup Where Assessed-Upper Body Bathing: Edge of bed Lower Body Bathing: Minimal assistance Where Assessed-Lower Body Bathing: Edge of bed Upper Body Dressing: Modified Independent Where Assessed-Upper Body Dressing: Edge of bed Lower Body Dressing: Modified independent Where Assessed-Lower Body Dressing: Edge of bed Toileting: Minimal assistance Where Assessed-Toileting:  Bedside Commode Toilet Transfer: Modified independent Toilet Transfer Method: Arts development officer: Extra wide drop arm bedside commode Vision Baseline Vision/History: No visual deficits Patient Visual Report: No change from baseline Vision Assessment?: No apparent visual deficits  Cognition Overall Cognitive Status: Within Functional Limits for tasks assessed Arousal/Alertness: Awake/alert Orientation Level: Oriented X4 Memory: Appears intact Awareness: Appears intact Problem Solving: Appears intact Safety/Judgment: Appears intact Sensation Sensation Light Touch: Appears Intact Proprioception: Appears Intact Coordination Gross Motor Movements are Fluid and Coordinated: Yes (generalized weakness and deconditioning) Fine Motor Movements are Fluid and Coordinated: Yes Finger Nose Finger Test: WFL bilaterally Heel Shin Test: decreased ROM due to body habitus Motor  Motor Motor: Within Functional Limits Motor - Skilled Clinical Observations: generalized weakness and deconditioning Mobility  Bed Mobility Bed Mobility: Rolling Right;Rolling Left;Sit to Supine;Supine to Sit Rolling Right: Independent with assistive device Rolling Left: Independent with assistive device Supine to Sit: Independent with assistive device Sit to Supine: Independent with assistive device Transfers Sit to Stand: Independent with assistive device Stand to Sit: Independent with assistive device  Trunk/Postural Assessment  Cervical Assessment Cervical Assessment: Exceptions to [facility] (forward head) Thoracic Assessment Thoracic Assessment: Exceptions to [facility] (mild kyphosis) Lumbar Assessment Lumbar Assessment: Exceptions to [facility] (posterior pelvic tilt) Postural Control Postural Control: Within Functional Limits  Balance Balance Balance Assessed: Yes Dynamic Sitting Balance Dynamic Sitting - Balance Support: Feet supported;No upper extremity supported Dynamic Sitting - Level of  Assistance: 7: Independent (donning shoes EOB) Dynamic Standing Balance Dynamic Standing - Balance Support: Right upper extremity supported (SPC) Dynamic Standing - Level of Assistance: 6: Modified independent (Device/Increase time) Dynamic Standing - Comments: ambulating using SPC to gather clothing items from closet Extremity/Trunk Assessment RUE Assessment RUE Assessment: Within Functional Limits LUE Assessment LUE Assessment: Within Functional Limits   Simonne Come 01/18/2021,  10:55 AM

## 2021-01-18 NOTE — Progress Notes (Addendum)
PROGRESS NOTE   Subjective/Complaints: Pt doing well. No new complaints. Area around still tender. Vac now off. C/o reflux when she exercises  ROS: Patient denies fever, rash, sore throat, blurred vision, nausea, vomiting, diarrhea, cough, shortness of breath or chest pain,  headache, or mood change.   Objective:   No results found. Recent Labs    01/17/21 0602 01/18/21 0643  WBC 7.1 6.9  HGB 8.1* 7.7*  HCT 26.8* 24.6*  PLT 402* 368   Recent Labs    01/17/21 0602 01/18/21 0643  NA 135 135  K 4.4 4.4  CL 105 103  CO2 22 24  GLUCOSE 101* 92  BUN 30* 30*  CREATININE 3.61* 3.39*  CALCIUM 8.4* 8.9    Intake/Output Summary (Last 24 hours) at 01/18/2021 1146 Last data filed at 01/18/2021 0537 Gross per 24 hour  Intake 1450.33 ml  Output 975 ml  Net 475.33 ml        Physical Exam: Vital Signs Blood pressure (!) 154/73, pulse 95, temperature 99 F (37.2 C), temperature source Oral, resp. rate 18, height 5\' 8"  (1.727 m), weight (!) 170.7 kg, last menstrual period 03/26/2015, SpO2 99 %. Constitutional: No distress . Vital signs reviewed. HEENT: EOMI, oral membranes moist Neck: supple Cardiovascular: RRR without murmur. No JVD    Respiratory/Chest: CTA Bilaterally without wheezes or rales. Normal effort    GI/Abdomen: BS +, non-tender, non-distended Ext: no clubbing, cyanosis, or edema Psych: pleasant and cooperative  Skin:    Comments: buttock wound dressed. Neurological:    Comments: Patient is alert.  No acute distress.  Oriented x3.  Moves all 4's 4+/5. Good sitting balance   Assessment/Plan: 1. Functional deficits which require 3+ hours per day of interdisciplinary therapy in a comprehensive inpatient rehab setting. Physiatrist is providing close team supervision and 24 hour management of active medical problems listed below. Physiatrist and rehab team continue to assess barriers to discharge/monitor  patient progress toward functional and medical goals  Care Tool:  Bathing    Body parts bathed by patient: Right arm, Chest, Left arm, Abdomen, Front perineal area, Right upper leg, Left upper leg, Face, Buttocks, Right lower leg, Left lower leg   Body parts bathed by helper: Buttocks     Bathing assist Assist Level: Supervision/Verbal cueing     Upper Body Dressing/Undressing Upper body dressing   What is the patient wearing?: Bra, Pull over shirt    Upper body assist Assist Level: Independent with assistive device    Lower Body Dressing/Undressing Lower body dressing      What is the patient wearing?: Underwear/pull up, Pants     Lower body assist Assist for lower body dressing: Independent with assitive device     Toileting Toileting    Toileting assist Assist for toileting: Independent with assistive device     Transfers Chair/bed transfer  Transfers assist  Chair/bed transfer activity did not occur: Safety/medical concerns (No bariatric WC available. Nausea)  Chair/bed transfer assist level: Supervision/Verbal cueing     Locomotion Ambulation   Ambulation assist   Ambulation activity did not occur: Safety/medical concerns (Nausea)  Assist level: Supervision/Verbal cueing Assistive device: Cane-straight Max distance: 138ft  Walk 10 feet activity   Assist  Walk 10 feet activity did not occur: Safety/medical concerns (Nausea)  Assist level: Supervision/Verbal cueing Assistive device: Cane-straight   Walk 50 feet activity   Assist Walk 50 feet with 2 turns activity did not occur: Safety/medical concerns (Nausea)  Assist level: Supervision/Verbal cueing Assistive device: Cane-straight    Walk 150 feet activity   Assist Walk 150 feet activity did not occur: Safety/medical concerns (Nausea)  Assist level: Supervision/Verbal cueing Assistive device: Cane-straight    Walk 10 feet on uneven surface  activity   Assist Walk 10 feet on  uneven surfaces activity did not occur: Safety/medical concerns (Nausea)         Wheelchair     Assist Will patient use wheelchair at discharge?: Yes Type of Wheelchair: Manual Wheelchair activity did not occur: Safety/medical concerns (No bariatric chair available)         Wheelchair 50 feet with 2 turns activity    Assist    Wheelchair 50 feet with 2 turns activity did not occur: Safety/medical concerns (No bariatric chair available)       Wheelchair 150 feet activity     Assist  Wheelchair 150 feet activity did not occur: Safety/medical concerns (No bariatric chair available)       Blood pressure (!) 154/73, pulse 95, temperature 99 F (37.2 C), temperature source Oral, resp. rate 18, height 5\' 8"  (1.727 m), weight (!) 170.7 kg, last menstrual period 03/26/2015, SpO2 99 %.  Medical Problem List and Plan: 1.  Severe sepsis/septic shock secondary to necrotizing left gluteal wound.  Status post I&D wound debridement 01/04/2021 per Dr. 03/07/2021.  Follow-up general surgery.  Wound VAC changes as directed             -patient may not shower while wound vac in place             -ELOS/Goals: DC date appears to be 7/18  --Continue CIR therapies including PT, OT, and SLP   2.  Impaired mobility, ambulating 160 feet: discontinue Lovenox             -antiplatelet therapy: N/A 3. Post-surgical pain: continue Oxycodone 5 to 10 mg every 4 hours as needed as well as 15 mg Monday Wednesday Friday with wound VAC changes as needed and Robaxin as needed- using regularly. 4. Mood: Provide emotional support             -antipsychotic agents: N/A 5. Neuropsych: This patient is capable of making decisions on her own behalf. 6. Skin/Wound Care: Routine skin checks  -now on bid MTD dressing with ABD  -son to come in who will learn dressing changes  -outpt f/u with surgery 7. Fluids/Electrolytes/Nutrition: encourage PO  -I personally reviewed the patient's labs today.   8.  ID.   Intravenous Zosyn 01/03/2021 and transitioned to Augmentin 01/10/2021. 9.  Diabetes mellitus.  Hemoglobin A1c 12.3.    CBG (last 3)  Recent Labs    01/17/21 2103 01/18/21 0606 01/18/21 1137  GLUCAP 143* 94 140*  -Continue Lantus 44U qam for now.--controlled 7/15  10.  AKI/multifactorial.   .  Renal ultrasound unremarkable. Appreciate renal f/u by nephrology. Encouraged adequate hydration.   -7/15 Cr down to 3.39   -avoid hypotensive episodes 11.  Obesity.  BMI 60.14.  Dietary follow-up 12. Vitamin D deficiency: continue ergocalciferol 50,00U weekly for 7 weeks 13. Magnesium deficiency: magnesium 4gm IV 7/9, normal 7/10.  14. Hypokalemia: klor 9/9 on 7/9, normal on 7/10, 3.8 7/11  15. HTN:      -reasonable control. Want to avoid hypotension  -Continue amlodipine to 2.5mg  qd 16. Urinary frequency/spasms: bothersome to her: urine cloudy and with increased WBC and rare bacteria: discussed with pharmacy broadening abx to meropenum -ucx negative/<10k colonies, wbc's down to 11.9 -dc meropenum  17. Anemia: hgb holding at 8.4. MCV 100  -likely due chronic disease/illness. IV Ferrlicit  -hgb down 7.7 today--will recheck Saturday for further drop vs lab variation 18. Thrombocytopenia: platelets 368k 7/15  19. Reflux: added scheduled protonix     LOS: 8 days A FACE TO FACE EVALUATION WAS PERFORMED  Ranelle Oyster 01/18/2021, 11:46 AM

## 2021-01-19 LAB — CBC WITH DIFFERENTIAL/PLATELET
Abs Immature Granulocytes: 0.02 10*3/uL (ref 0.00–0.07)
Basophils Absolute: 0.1 10*3/uL (ref 0.0–0.1)
Basophils Relative: 1 %
Eosinophils Absolute: 0.5 10*3/uL (ref 0.0–0.5)
Eosinophils Relative: 7 %
HCT: 25.5 % — ABNORMAL LOW (ref 36.0–46.0)
Hemoglobin: 8 g/dL — ABNORMAL LOW (ref 12.0–15.0)
Immature Granulocytes: 0 %
Lymphocytes Relative: 18 %
Lymphs Abs: 1.1 10*3/uL (ref 0.7–4.0)
MCH: 32.1 pg (ref 26.0–34.0)
MCHC: 31.4 g/dL (ref 30.0–36.0)
MCV: 102.4 fL — ABNORMAL HIGH (ref 80.0–100.0)
Monocytes Absolute: 0.8 10*3/uL (ref 0.1–1.0)
Monocytes Relative: 14 %
Neutro Abs: 3.7 10*3/uL (ref 1.7–7.7)
Neutrophils Relative %: 60 %
Platelets: 369 10*3/uL (ref 150–400)
RBC: 2.49 MIL/uL — ABNORMAL LOW (ref 3.87–5.11)
RDW: 16 % — ABNORMAL HIGH (ref 11.5–15.5)
WBC: 6.1 10*3/uL (ref 4.0–10.5)
nRBC: 0 % (ref 0.0–0.2)

## 2021-01-19 LAB — GLUCOSE, CAPILLARY
Glucose-Capillary: 78 mg/dL (ref 70–99)
Glucose-Capillary: 119 mg/dL — ABNORMAL HIGH (ref 70–99)
Glucose-Capillary: 146 mg/dL — ABNORMAL HIGH (ref 70–99)
Glucose-Capillary: 177 mg/dL — ABNORMAL HIGH (ref 70–99)

## 2021-01-19 LAB — CBC
HCT: 23.3 % — ABNORMAL LOW (ref 36.0–46.0)
Hemoglobin: 7.4 g/dL — ABNORMAL LOW (ref 12.0–15.0)
MCH: 32 pg (ref 26.0–34.0)
MCHC: 31.8 g/dL (ref 30.0–36.0)
MCV: 100.9 fL — ABNORMAL HIGH (ref 80.0–100.0)
Platelets: 351 10*3/uL (ref 150–400)
RBC: 2.31 MIL/uL — ABNORMAL LOW (ref 3.87–5.11)
RDW: 15.9 % — ABNORMAL HIGH (ref 11.5–15.5)
WBC: 7.2 10*3/uL (ref 4.0–10.5)
nRBC: 0 % (ref 0.0–0.2)

## 2021-01-19 LAB — BASIC METABOLIC PANEL
Anion gap: 8 (ref 5–15)
BUN: 27 mg/dL — ABNORMAL HIGH (ref 6–20)
CO2: 23 mmol/L (ref 22–32)
Calcium: 8.9 mg/dL (ref 8.9–10.3)
Chloride: 107 mmol/L (ref 98–111)
Creatinine, Ser: 3.21 mg/dL — ABNORMAL HIGH (ref 0.44–1.00)
GFR, Estimated: 16 mL/min — ABNORMAL LOW (ref >60)
Glucose, Bld: 136 mg/dL — ABNORMAL HIGH (ref 70–99)
Potassium: 4.7 mmol/L (ref 3.5–5.1)
Sodium: 138 mmol/L (ref 135–145)

## 2021-01-19 LAB — PARATHYROID HORMONE, INTACT (NO CA): PTH: 157 pg/mL — ABNORMAL HIGH (ref 15–65)

## 2021-01-19 NOTE — Progress Notes (Signed)
PROGRESS NOTE   Subjective/Complaints:  Pt reports pain has eased up a lot- so no pain.  Having a good day.  2-3 Bms yesterday- good solid BM's.   OK- having a good day.   ROS:  Pt denies SOB, abd pain, CP, N/V/C/D, and vision changes    Objective:   No results found. Recent Labs    01/18/21 0643 01/19/21 0410  WBC 6.9 7.2  HGB 7.7* 7.4*  HCT 24.6* 23.3*  PLT 368 351   Recent Labs    01/17/21 0602 01/18/21 0643  NA 135 135  K 4.4 4.4  CL 105 103  CO2 22 24  GLUCOSE 101* 92  BUN 30* 30*  CREATININE 3.61* 3.39*  CALCIUM 8.4* 8.9    Intake/Output Summary (Last 24 hours) at 01/19/2021 1032 Last data filed at 01/19/2021 0744 Gross per 24 hour  Intake 440 ml  Output 1300 ml  Net -860 ml        Physical Exam: Vital Signs Blood pressure (!) 149/72, pulse 90, temperature 99 F (37.2 C), temperature source Oral, resp. rate 18, height 5\' 8"  (1.727 m), weight (!) 170.7 kg, last menstrual period 03/26/2015, SpO2 97 %.    General: awake, alert, appropriate, sitting EOB, NAD HENT: conjugate gaze; oropharynx moist CV: regular rate; no JVD Pulmonary: CTA B/L; no W/R/R- good air movement GI: soft, NT, ND, (+)BS- protuberant; weight down to 170 from 179 kg Psychiatric: appropriate and cooperative Neurological: Ox3-  Skin:    Comments: buttock wound dressed. Neurological:    Comments: Patient is alert.  No acute distress.  Oriented x3.  Moves all 4's 4+/5. Good sitting balance   Assessment/Plan: 1. Functional deficits which require 3+ hours per day of interdisciplinary therapy in a comprehensive inpatient rehab setting. Physiatrist is providing close team supervision and 24 hour management of active medical problems listed below. Physiatrist and rehab team continue to assess barriers to discharge/monitor patient progress toward functional and medical goals  Care Tool:  Bathing    Body parts bathed by  patient: Right arm, Chest, Left arm, Abdomen, Front perineal area, Right upper leg, Left upper leg, Face, Buttocks, Right lower leg, Left lower leg   Body parts bathed by helper: Buttocks     Bathing assist Assist Level: Supervision/Verbal cueing     Upper Body Dressing/Undressing Upper body dressing   What is the patient wearing?: Bra, Pull over shirt    Upper body assist Assist Level: Independent with assistive device    Lower Body Dressing/Undressing Lower body dressing      What is the patient wearing?: Underwear/pull up, Pants     Lower body assist Assist for lower body dressing: Independent with assitive device     Toileting Toileting    Toileting assist Assist for toileting: Independent with assistive device     Transfers Chair/bed transfer  Transfers assist  Chair/bed transfer activity did not occur: Safety/medical concerns (No bariatric WC available. Nausea)  Chair/bed transfer assist level: Supervision/Verbal cueing     Locomotion Ambulation   Ambulation assist   Ambulation activity did not occur: Safety/medical concerns (Nausea)  Assist level: Supervision/Verbal cueing Assistive device: Cane-straight Max distance: 113ft  Walk 10 feet activity   Assist  Walk 10 feet activity did not occur: Safety/medical concerns (Nausea)  Assist level: Supervision/Verbal cueing Assistive device: Cane-straight   Walk 50 feet activity   Assist Walk 50 feet with 2 turns activity did not occur: Safety/medical concerns (Nausea)  Assist level: Supervision/Verbal cueing Assistive device: Cane-straight    Walk 150 feet activity   Assist Walk 150 feet activity did not occur: Safety/medical concerns (Nausea)  Assist level: Supervision/Verbal cueing Assistive device: Cane-straight    Walk 10 feet on uneven surface  activity   Assist Walk 10 feet on uneven surfaces activity did not occur: Safety/medical concerns (Nausea)          Wheelchair     Assist Will patient use wheelchair at discharge?: Yes Type of Wheelchair: Manual Wheelchair activity did not occur: Safety/medical concerns (No bariatric chair available)         Wheelchair 50 feet with 2 turns activity    Assist    Wheelchair 50 feet with 2 turns activity did not occur: Safety/medical concerns (No bariatric chair available)       Wheelchair 150 feet activity     Assist  Wheelchair 150 feet activity did not occur: Safety/medical concerns (No bariatric chair available)       Blood pressure (!) 149/72, pulse 90, temperature 99 F (37.2 C), temperature source Oral, resp. rate 18, height 5\' 8"  (1.727 m), weight (!) 170.7 kg, last menstrual period 03/26/2015, SpO2 97 %.  Medical Problem List and Plan: 1.  Severe sepsis/septic shock secondary to necrotizing left gluteal wound.  Status post I&D wound debridement 01/04/2021 per Dr. 03/07/2021.  Follow-up general surgery.  Wound VAC changes as directed             -patient may not shower while wound vac in place             -ELOS/Goals: DC date appears to be 7/18  --WOUND VAC is off- con't PT and OT  2.  Impaired mobility, ambulating 160 feet: discontinue Lovenox             -antiplatelet therapy: N/A 3. Post-surgical pain: continue Oxycodone 5 to 10 mg every 4 hours as needed as well as 15 mg Monday Wednesday Friday with wound VAC changes as needed and Robaxin as needed- using regularly.  7/16- pain is well controlled- con't regimen prn 4. Mood: Provide emotional support             -antipsychotic agents: N/A 5. Neuropsych: This patient is capable of making decisions on her own behalf. 6. Skin/Wound Care: Routine skin checks  -now on bid MTD dressing with ABD  -son to come in who will learn dressing changes  -outpt f/u with surgery 7. Fluids/Electrolytes/Nutrition: encourage PO  -I personally reviewed the patient's labs today.   8.  ID.  Intravenous Zosyn 01/03/2021 and transitioned to  Augmentin 01/10/2021. 9.  Diabetes mellitus.  Hemoglobin A1c 12.3.    CBG (last 3)  Recent Labs    01/18/21 1625 01/18/21 2058 01/19/21 0617  GLUCAP 151* 170* 78  -Continue Lantus 44U qam for now.--  7/16- BG's overall controlled- con't regimen 10.  AKI/multifactorial.   .  Renal ultrasound unremarkable. Appreciate renal f/u by nephrology. Encouraged adequate hydration.   -7/15 Cr down to 3.39   -avoid hypotensive episodes 11.  Obesity.  BMI 60.14.  Dietary follow-up 12. Vitamin D deficiency: continue ergocalciferol 50,00U weekly for 7 weeks 13. Magnesium deficiency: magnesium 4gm IV 7/9, normal  7/10.  14. Hypokalemia: klor on 7/9, normal on 7/10, 3.8 7/11 15. HTN:      -reasonable control. Want to avoid hypotension  -Continue amlodipine to 2.5mg  qd 16. Urinary frequency/spasms: bothersome to her: urine cloudy and with increased WBC and rare bacteria: discussed with pharmacy broadening abx to meropenum -ucx negative/<10k colonies, wbc's down to 11.9 -dc meropenum  17. Anemia: hgb holding at 8.4. MCV 100  -likely due chronic disease/illness. IV Ferrlicit  -hgb down 7.7 today--will recheck Saturday for further drop vs lab variation  7/16- Hb down to 7.4- was 8.1 on 7/14 BUN didn't elevate, so GI bleed less likely- will recheck 1 more time in AM and no obvious source of bleeding- will check a hemoccult, just in case.  18. Thrombocytopenia: platelets 368k 7/15  19. Reflux: added scheduled protonix     LOS: 9 days A FACE TO FACE EVALUATION WAS PERFORMED  Tiarah Shisler 01/19/2021, 10:32 AM

## 2021-01-19 NOTE — Progress Notes (Signed)
Occupational Therapy Session Note  Patient Details  Name: Erin Davila MRN: [id number] Date of Birth: February 01, 1961  Today's Date: 01/19/2021 OT Individual Time: 1015-1058 OT Individual Time Calculation (min): 43 min    Short Term Goals: Week 1:  OT Short Term Goal 1 (Week 1): STG = LTGs due to ELOS   Skilled Therapeutic Interventions/Progress Updates:    Pt greeted at time of session sitting up in recliner agreeable to OT session wanting to wash up and get dressed. Discussed DC date and knows she is leaving Monday. Needing to toilet first, walking short distance chair > BSC > bed Supervision overall no AD. Pt requesting assist with posterior hygiene after BM, stating her son with help with this at home. Pt able to doff clothing seated on BSC with no assist. Pt performed UB/LB washing sitting EOB with pt's method for lateral leans and propping LE's up on bed surface for washing periarea. Donned shirt and bra Mod I, ran out of time for LB dressing and hand off to RN to change buttocks dressings and confirmed she can supervise for pt to don LB clothing. Note pt able to direct her care throughout session, stating her son will be helping her at home. Pt left sitting EOB and hand off to RN.  Therapy Documentation Precautions:  Precautions Precautions: None Precaution Comments: wound on buttocks Restrictions Weight Bearing Restrictions: No     Therapy/Group: Individual Therapy  Erasmo Score 01/19/2021, 7:19 AM

## 2021-01-19 NOTE — Progress Notes (Signed)
Physical Therapy Session Note  Patient Details  Name: Erin Davila MRN: [id number] Date of Birth: May 09, 1961  Today's Date: 01/19/2021 PT Individual Time: 0902-0955 PT Individual Time Calculation (min): 53 min   Short Term Goals: Week 1:  PT Short Term Goal 1 (Week 1): STG = LTG due to LOS  Skilled Therapeutic Interventions/Progress Updates:   Received pt supine in bed, pt agreeable to PT treatment, and denied any pain during session just reporting mild "discomfort" in buttocks. Session with emphasis on functional mobility/transfers, dressing, toileting, generalized strengthening, dynamic standing balance/coordination, simulated car transfers, stair navigation, and improved activity tolerance. Pt transferred supine<>sitting EOB from flat bed mod I using bedrails and brushed teeth and dressed upper and lower body sitting EOB with set up assist but required max A to don shoes due to body habitus. Pt performed stand<>pivots throughout session with close supervision. Pt able to void on bedside commode and perform peri-care and clothing management with supervision. Pt transported to/from room on 5C in [facility] total A for time management purposes. Pt performed simulated car transfer without AD and supervision. Pt entered on driver's side and upon backing up stepped R foot backwards into car prior to sitting. Educated pt that this method of entry is not the safest, however pt reports that due to her body size and sensitivity on buttocks this is how she has to enter the car. Pt then ambulated 45ft on uneven surfaces (ramp) with SPC and supervision. Pt transported to therapy gym and navigated 12 steps with 1 railing and SPC with supervision ascending and descending with a step to pattern. Pt required 2 standing rest breaks on steps due to fatigue but was motivated to complete all 12. Concluded session with pt sitting in recliner with all needs within reach. RN made aware of pt's request for dressing change.    Therapy Documentation Precautions:  Precautions Precautions: None Precaution Comments: wound on buttocks Restrictions Weight Bearing Restrictions: No  Therapy/Group: Individual Therapy Martin Majestic PT, DPT   01/19/2021, 7:37 AM

## 2021-01-20 LAB — OCCULT BLOOD X 1 CARD TO LAB, STOOL: Fecal Occult Bld: NEGATIVE

## 2021-01-20 LAB — CBC WITH DIFFERENTIAL/PLATELET
Abs Immature Granulocytes: 0.01 10*3/uL (ref 0.00–0.07)
Basophils Absolute: 0 10*3/uL (ref 0.0–0.1)
Basophils Relative: 1 %
Eosinophils Absolute: 0.5 10*3/uL (ref 0.0–0.5)
Eosinophils Relative: 9 %
HCT: 26.4 % — ABNORMAL LOW (ref 36.0–46.0)
Hemoglobin: 8.1 g/dL — ABNORMAL LOW (ref 12.0–15.0)
Immature Granulocytes: 0 %
Lymphocytes Relative: 28 %
Lymphs Abs: 1.8 10*3/uL (ref 0.7–4.0)
MCH: 31.6 pg (ref 26.0–34.0)
MCHC: 30.7 g/dL (ref 30.0–36.0)
MCV: 103.1 fL — ABNORMAL HIGH (ref 80.0–100.0)
Monocytes Absolute: 0.9 10*3/uL (ref 0.1–1.0)
Monocytes Relative: 15 %
Neutro Abs: 3 10*3/uL (ref 1.7–7.7)
Neutrophils Relative %: 47 %
Platelets: 369 10*3/uL (ref 150–400)
RBC: 2.56 MIL/uL — ABNORMAL LOW (ref 3.87–5.11)
RDW: 16.1 % — ABNORMAL HIGH (ref 11.5–15.5)
WBC: 6.3 10*3/uL (ref 4.0–10.5)
nRBC: 0 % (ref 0.0–0.2)

## 2021-01-20 LAB — GLUCOSE, CAPILLARY
Glucose-Capillary: 128 mg/dL — ABNORMAL HIGH (ref 70–99)
Glucose-Capillary: 138 mg/dL — ABNORMAL HIGH (ref 70–99)
Glucose-Capillary: 119 mg/dL — ABNORMAL HIGH (ref 70–99)
Glucose-Capillary: 186 mg/dL — ABNORMAL HIGH (ref 70–99)

## 2021-01-20 NOTE — Discharge Summary (Signed)
Physician Discharge Summary  Patient ID: Erin Davila MRN: [id number] DOB/AGE: Nov 30, 1960 60 y.o.  Admit date: 01/10/2021 Discharge date: 01/21/2021  Discharge Diagnoses:  Principal Problem:   Debility Active Problems:   Sepsis (HCC) Necrotizing left gluteal wound Diabetes mellitus AKI on CKD Obesity Hypertension GERD  Discharged Condition: Stable  Significant Diagnostic Studies: US RENAL  Result Date: 01/04/2021 CLINICAL DATA:  Acute renal insufficiency EXAM: RENAL / URINARY TRACT ULTRASOUND COMPLETE COMPARISON:  None. FINDINGS: Right Kidney: Renal measurements: 10.0 x 4.6 x 4.2 cm = volume: 101 mL. Slightly limited evaluation of the lower pole the right kidney due to overlying bowel gas. Echogenicity within normal limits. No mass or hydronephrosis visualized. Left Kidney: Renal measurements: 10.9 x 5.7 x 4.9 cm = volume: 159 mL. Echogenicity within normal limits. No mass or hydronephrosis visualized. Bladder: Appears normal for degree of bladder distention. Bilateral ureteral jets are identified. Other: None. IMPRESSION: Normal renal sonogram Electronically Signed   By: Helyn Numbers MD   On: 01/04/2021 00:49    Labs:  Basic Metabolic Panel: Recent Labs  Lab 01/14/21 0725 01/16/21 0756 01/17/21 0602 01/18/21 0643 01/19/21 1144  NA 134* 136 135 135 138  K 3.8 4.6 4.4 4.4 4.7  CL 101 106 105 103 107  CO2 25 24 22 24 23   GLUCOSE 137* 91 101* 92 136*  BUN 22* 26* 30* 30* 27*  CREATININE 3.23* 3.61* 3.61* 3.39* 3.21*  CALCIUM 8.4* 8.9 8.4* 8.9 8.9  PHOS  --   --  5.0*  --   --     CBC: Recent Labs  Lab 01/18/21 0643 01/19/21 0410 01/19/21 1144 01/20/21 0722  WBC 6.9 7.2 6.1 6.3  NEUTROABS 3.9  --  3.7 3.0  HGB 7.7* 7.4* 8.0* 8.1*  HCT 24.6* 23.3* 25.5* 26.4*  MCV 102.1* 100.9* 102.4* 103.1*  PLT 368 351 369 369    CBG: Recent Labs  Lab 01/19/21 2106 01/20/21 0603 01/20/21 1155 01/20/21 1651 01/20/21 2103  GLUCAP 177* 128* 138* 119* 186*   Family  history.  Positive for hypertension as well as hyperlipidemia.  Denies any colon cancer esophageal cancer or rectal cancer  Brief HPI:   Erin Davila is a 60 y.o. right-handed female with history of insulin-dependent diabetes mellitus hypertension morbid obesity with BMI 60.14 CKD with baseline creatinine 2.0.  Per chart review lives alone 1 level home 2 steps to entry.  Reportedly independent prior to admission and active.  She has a son from college home for the summer.  Presented 01/03/2021 after a direct admission from general surgery outpatient office for evaluation of necrotizing wound to the left buttock.  Patient recently had incision and drainage for left gluteal abscess was taking home antibiotics.  On presentation to the office of general surgery wound was found to be necrotic with persistent drainage and therefore she was sent for direct admission.  Admission chemistry sodium 129 glucose 685 BUN 74 creatinine 4.47 WBC 15,500 hemoglobin A1c 12.8 SARS coronavirus negative.  Renal services consulted for AKI with baseline creatinine 2.0 renal ultrasound showed no obstruction.  Maintained on gentle IV fluids latest creatinine 3.17.  General surgery continue to follow patient she did undergo sharp debridement incision and drainage 01/04/2021 per Dr. 03/07/2021.  Wound VAC was placed changed Monday Wednesday Fridays.  Placed on Zosyn 01/03/2021 for wound coverage transitioned to Augmentin 01/10/2021.  Subcutaneous Lovenox for DVT prophylaxis.  Therapy evaluations completed due to patient decreased functional mobility was admitted for a comprehensive rehab program.  Hospital Course: Erin Davila was admitted to rehab 01/10/2021 for inpatient therapies to consist of PT, ST and OT at least three hours five days a week. Past admission physiatrist, therapy team and rehab RN have worked together to provide customized collaborative inpatient rehab.  Pertaining to patient's severe sepsis septic shock secondary to  necrotizing gluteal wound.  Status post irrigation debridement wound 01/04/2021 per Dr. Daphine Deutscher follow general surgery wound VAC as directed later discontinued dressing changes as advised.  Initially on subcutaneous Lovenox for DVT prophylaxis discontinued ambulating 160 feet.  Postsurgical pain oxycodone as advised as well as Robaxin.  Infectious disease for noted necrotizing left gluteal wound Zosyn 01/03/2021 transition to Augmentin 01/10/2021 and completing course.  Blood sugars controlled monitored hemoglobin A1c 12.3 insulin therapy as directed with diabetic teaching.  AKI on CKD renal ultrasound unremarkable follow-up renal services latest creatinine stable 3.21.  Blood pressure controlled and monitored on Norvasc and would need outpatient follow-up.  GERD with the use of Protonix.  Anemia chronic in nature latest hemoglobin 8.1 no bleeding episodes patient did receive ferrous gluconate x4 doses.   Blood pressures were monitored on TID basis and soft and monitored  Diabetes has been monitored with ac/hs CBG checks and SSI was use prn for tighter BS control.    Rehab course: During patient's stay in rehab weekly team conferences were held to monitor patient's progress, set goals and discuss barriers to discharge. At admission, patient required min assist 6 feet rolling walker mod assist supine to sit moderate assist sit to supine  Physical exam.  Blood pressure 108/70 pulse 80 temperature 98 respirations 18 oxygen saturations 92% room air Constitutional.  No acute distress HEENT Head.  Normocephalic and atraumatic Eyes.  Pupils round and reactive to light no discharge without nystagmus Neck.  Supple nontender no JVD without thyromegaly Cardiac regular rate rhythm not extra sounds or murmur heard Abdomen.  Soft obese nontender positive bowel sounds without rebound Respiratory effort normal no respiratory distress without wheeze Skin.  Wound VAC was in place to left buttock abscess Neurologic.   Alert oriented x3 moves all extremities  He/She  has had improvement in activity tolerance, balance, postural control as well as ability to compensate for deficits. He/She has had improvement in functional use RUE/LUE  and RLE/LLE as well as improvement in awareness.  Sessions with emphasis on functional mobility transfer dressing toileting generalized strengthening.  Transferred supine to sit edge of bed 5 independent.  Brushes teeth and dressed upper lower body edge of bed with set up assistance.  Perform stand pivot transfers throughout sessions with close supervision.  Patient able to void on bedside commode perform PERI care and clothing management supervision.  Ambulates household distances with straight point cane and supervision.  Walks into the bathroom supervision overall no assistive device.  Requesting some assist for posterior hygiene.  Performed upper and lower body washing edge of bed with set up.  Full family teaching completed plan discharge to home       Disposition: Discharged to home    Diet: Diabetic diet  Special Instructions: No driving smoking or alcohol  Wound care.  Place saline moistened gauze into the left buttock wound cover with ABD pads and tape in place.  Change twice daily and if it becomes soiled or dislodged.  Medications at discharge. 1.  Tylenol as needed 2.  Norvasc 2.5 mg p.o. daily 3.  Lantus insulin 44 units subcutaneous daily 4.  Robaxin 500 mg every 6 hours as  needed muscle spasms 5.  Oxycodone 5 to 10 mg every 4 hours as needed pain 6.  Protonix 40 mg p.o. daily 7.  Vitamin D 50,000 units every 7 days  30-35 minutes were spent completing discharge summary and discharge planning  Discharge Instructions     Ambulatory referral to Physical Medicine Rehab   Complete by: As directed    Moderate complexity follow-up 1 to 2 weeks debility/sepsis        Follow-up Information     Raulkar, Drema Pry, MD Follow up.   Specialty: Physical  Medicine and Rehabilitation Why: 9/2 10:40. Please call our clinic at [phone] with any questions/concerns you have earlier. Contact information: [address] [place] [address] [phone]         Luretha Murphy, MD Follow up.   Specialty: General Surgery Why: Call for appointment Contact information: [address] [place] [address] [phone]         Delano Metz, MD Follow up.   Specialty: Nephrology Why: As needed Contact information: [address] [place] [address] [phone]         [facility] Follow up on 01/30/2021.   Specialty: Wound Care Why: Appointment @ 1:15 PM Contact information: [address] [place], [address] [id number] Wilhemina Bonito [place] [address] [phone]                Signed: Mcarthur Rossetti Dave Mannes 01/21/2021, 5:19 AM

## 2021-01-20 NOTE — Progress Notes (Signed)
PROGRESS NOTE   Subjective/Complaints:  Pt reports getting IV iron. LBM overnight-  No issues- thought was leaving tomorrow, but doesn't know for sure- I explained I cannot contact SW today, but if I find anything out, will let her know.   ROS:  Pt denies SOB, abd pain, CP, N/V/C/D, and vision changes   Objective:   No results found. Recent Labs    01/19/21 1144 01/20/21 0722  WBC 6.1 6.3  HGB 8.0* 8.1*  HCT 25.5* 26.4*  PLT 369 369   Recent Labs    01/18/21 0643 01/19/21 1144  NA 135 138  K 4.4 4.7  CL 103 107  CO2 24 23  GLUCOSE 92 136*  BUN 30* 27*  CREATININE 3.39* 3.21*  CALCIUM 8.9 8.9    Intake/Output Summary (Last 24 hours) at 01/20/2021 1623 Last data filed at 01/20/2021 1309 Gross per 24 hour  Intake 236 ml  Output --  Net 236 ml        Physical Exam: Vital Signs Blood pressure (!) 142/73, pulse 89, temperature 99.1 F (37.3 C), temperature source Oral, resp. rate 19, height 5\' 8"  (1.727 m), weight (!) 170.7 kg, last menstrual period 03/26/2015, SpO2 100 %.     General: awake, alert, appropriate, sitting EOB; getting IV iron by nursing; weight 170kg; NAD HENT: conjugate gaze; oropharynx moist CV: regular rate; no JVD Pulmonary: CTA B/L; no W/R/R- good air movement GI: soft, NT, ND, (+)BS Psychiatric: appropriate Neurological: Ox3   Skin:    Comments: buttock wound dressed. Neurological:    Comments: Patient is alert.  No acute distress.  Oriented x3.  Moves all 4's 4+/5. Good sitting balance   Assessment/Plan: 1. Functional deficits which require 3+ hours per day of interdisciplinary therapy in a comprehensive inpatient rehab setting. Physiatrist is providing close team supervision and 24 hour management of active medical problems listed below. Physiatrist and rehab team continue to assess barriers to discharge/monitor patient progress toward functional and medical goals  Care  Tool:  Bathing    Body parts bathed by patient: Right arm, Chest, Left arm, Abdomen, Front perineal area, Right upper leg, Left upper leg, Face, Right lower leg, Left lower leg   Body parts bathed by helper: Buttocks     Bathing assist Assist Level: Minimal Assistance - Patient > 75%     Upper Body Dressing/Undressing Upper body dressing   What is the patient wearing?: Bra, Dress    Upper body assist Assist Level: Independent with assistive device    Lower Body Dressing/Undressing Lower body dressing      What is the patient wearing?: Underwear/pull up     Lower body assist Assist for lower body dressing: Independent with assitive device     Toileting Toileting    Toileting assist Assist for toileting: Minimal Assistance - Patient > 75%     Transfers Chair/bed transfer  Transfers assist  Chair/bed transfer activity did not occur: Safety/medical concerns (No bariatric WC available. Nausea)  Chair/bed transfer assist level: Independent with assistive device Chair/bed transfer assistive device: 03/28/2015   Ambulation assist   Ambulation activity did not occur: Safety/medical concerns (Nausea)  Assist level:  Independent with assistive device Assistive device: Cane-straight Max distance: 200'   Walk 10 feet activity   Assist  Walk 10 feet activity did not occur: Safety/medical concerns (Nausea)  Assist level: Independent with assistive device Assistive device: Cane-straight   Walk 50 feet activity   Assist Walk 50 feet with 2 turns activity did not occur: Safety/medical concerns (Nausea)  Assist level: Independent with assistive device Assistive device: Cane-straight    Walk 150 feet activity   Assist Walk 150 feet activity did not occur: Safety/medical concerns (Nausea)  Assist level: Independent with assistive device Assistive device: Cane-straight    Walk 10 feet on uneven surface  activity   Assist Walk 10 feet on  uneven surfaces activity did not occur: Safety/medical concerns (Nausea)   Assist level: Supervision/Verbal cueing Assistive device: Ship broker Will patient use wheelchair at discharge?: No Type of Wheelchair: Manual Wheelchair activity did not occur: N/A         Wheelchair 50 feet with 2 turns activity    Assist    Wheelchair 50 feet with 2 turns activity did not occur: N/A       Wheelchair 150 feet activity     Assist  Wheelchair 150 feet activity did not occur: N/A       Blood pressure (!) 142/73, pulse 89, temperature 99.1 F (37.3 C), temperature source Oral, resp. rate 19, height 5\' 8"  (1.727 m), weight (!) 170.7 kg, last menstrual period 03/26/2015, SpO2 100 %.  Medical Problem List and Plan: 1.  Severe sepsis/septic shock secondary to necrotizing left gluteal wound.  Status post I&D wound debridement 01/04/2021 per Dr. 03/07/2021.  Follow-up general surgery.  Wound VAC changes as directed             -patient may not shower while wound vac in place             -ELOS/Goals: DC date appears to be 7/18  --WOUND VAC is off- con't PT and OT -con't PT and OT- will have team check in AM when pt is scheduled to leave. It says tomorrow, but pt wasn't sure- wasn't on board.   2.  Impaired mobility, ambulating 160 feet: discontinue Lovenox             -antiplatelet therapy: N/A 3. Post-surgical pain: continue Oxycodone 5 to 10 mg every 4 hours as needed as well as 15 mg Monday Wednesday Friday with wound VAC changes as needed and Robaxin as needed- using regularly.  7/17- pain controlled- con't regimen 4. Mood: Provide emotional support             -antipsychotic agents: N/A 5. Neuropsych: This patient is capable of making decisions on her own behalf. 6. Skin/Wound Care: Routine skin checks  -now on bid MTD dressing with ABD  -son to come in who will learn dressing changes  -outpt f/u with surgery 7. Fluids/Electrolytes/Nutrition:  encourage PO  -I personally reviewed the patient's labs today.   8.  ID.  Intravenous Zosyn 01/03/2021 and transitioned to Augmentin 01/10/2021. 9.  Diabetes mellitus.  Hemoglobin A1c 12.3.    CBG (last 3)  Recent Labs    01/19/21 2106 01/20/21 0603 01/20/21 1155  GLUCAP 177* 128* 138*  -Continue Lantus 44U qam for now.--  7/17- BG's controlled- con't regimen 10.  AKI/multifactorial.   .  Renal ultrasound unremarkable. Appreciate renal f/u by nephrology. Encouraged adequate hydration.   -7/15 Cr down to 3.39   -avoid hypotensive  episodes  7/17- Cr down to 3.21  11.  Obesity.  BMI 60.14.  Dietary follow-up 12. Vitamin D deficiency: continue ergocalciferol 50,00U weekly for 7 weeks 13. Magnesium deficiency: magnesium 4gm IV 7/9, normal 7/10.  14. Hypokalemia: klor on 7/9, normal on 7/10, 3.8 7/11 15. HTN:      -reasonable control. Want to avoid hypotension  -Continue amlodipine to 2.5mg  qd 16. Urinary frequency/spasms: bothersome to her: urine cloudy and with increased WBC and rare bacteria: discussed with pharmacy broadening abx to meropenum -ucx negative/<10k colonies, wbc's down to 11.9 -dc meropenum  17. Anemia: hgb holding at 8.4. MCV 100  -likely due chronic disease/illness. IV Ferrlicit  -hgb down 7.7 today--will recheck Saturday for further drop vs lab variation  7/16- Hb down to 7.4- was 8.1 on 7/14 BUN didn't elevate, so GI bleed less likely- will recheck 1 more time in AM and no obvious source of bleeding- will check a hemoccult, just in case. 7/17-  occult is (-); and Hb up to 8.1- so doing much better- no transfusion needed.  18. Thrombocytopenia: platelets 368k 7/15   7/17- Plts 365- stable- con't to monitor 19. Reflux: added scheduled protonix     LOS: 10 days A FACE TO FACE EVALUATION WAS PERFORMED  Erin Davila 01/20/2021, 4:23 PM

## 2021-01-20 NOTE — Plan of Care (Signed)
  Problem: RH Bathing Goal: LTG Patient will bathe all body parts with assist levels (OT) Description: LTG: Patient will bathe all body parts with assist levels (OT) Outcome: Not Met (add Reason) Note: Pt still requires assist to reach buttocks due to wounds, per pt, son can assist with this   Problem: RH Dressing Goal: LTG Patient will perform upper body dressing (OT) Description: LTG Patient will perform upper body dressing with assist, with/without cues (OT). Outcome: Not Met (add Reason) Flowsheets (Taken 01/20/2021 0933) LTG: Pt will perform upper body dressing with assistance level of: Independent with assistive device Note: Pt requires SPC to safely retrieve clothing   Problem: RH Toileting Goal: LTG Patient will perform toileting task (3/3 steps) with assistance level (OT) Description: LTG: Patient will perform toileting task (3/3 steps) with assistance level (OT)  Outcome: Not Met (add Reason) Note: Pt still requires assist to reach buttocks due to wounds, per pt, son can assist with this   Problem: RH Balance Goal: LTG Patient will maintain dynamic standing with ADLs (OT) Description: LTG:  Patient will maintain dynamic standing balance with assist during activities of daily living (OT)  Outcome: Completed/Met   Problem: Sit to Stand Goal: LTG:  Patient will perform sit to stand in prep for activites of daily living with assistance level (OT) Description: LTG:  Patient will perform sit to stand in prep for activites of daily living with assistance level (OT) Outcome: Completed/Met   Problem: RH Dressing Goal: LTG Patient will perform lower body dressing w/assist (OT) Description: LTG: Patient will perform lower body dressing with assist, with/without cues in positioning using equipment (OT) Outcome: Completed/Met   Problem: RH Simple Meal Prep Goal: LTG Patient will perform simple meal prep w/assist (OT) Description: LTG: Patient will perform simple meal prep with  assistance, with/without cues (OT). Outcome: Completed/Met   Problem: RH Light Housekeeping Goal: LTG Patient will perform light housekeeping w/assist (OT) Description: LTG: Patient will perform light housekeeping with assistance, with/without cues (OT). Outcome: Completed/Met   Problem: RH Toilet Transfers Goal: LTG Patient will perform toilet transfers w/assist (OT) Description: LTG: Patient will perform toilet transfers with assist, with/without cues using equipment (OT) Outcome: Completed/Met

## 2021-01-20 NOTE — Plan of Care (Signed)
  Problem: Sit to Stand Goal: LTG:  Patient will perform sit to stand in prep for activites of daily living with assistance level (OT) Description: LTG:  Patient will perform sit to stand in prep for activites of daily living with assistance level (OT) Outcome: Completed/Met   Problem: RH Dressing Goal: LTG Patient will perform lower body dressing w/assist (OT) Description: LTG: Patient will perform lower body dressing with assist, with/without cues in positioning using equipment (OT) Outcome: Completed/Met   Problem: RH Simple Meal Prep Goal: LTG Patient will perform simple meal prep w/assist (OT) Description: LTG: Patient will perform simple meal prep with assistance, with/without cues (OT). Outcome: Completed/Met   Problem: RH Light Housekeeping Goal: LTG Patient will perform light housekeeping w/assist (OT) Description: LTG: Patient will perform light housekeeping with assistance, with/without cues (OT). Outcome: Completed/Met   Problem: RH Toilet Transfers Goal: LTG Patient will perform toilet transfers w/assist (OT) Description: LTG: Patient will perform toilet transfers with assist, with/without cues using equipment (OT) Outcome: Completed/Met   Problem: RH Bathing Goal: LTG Patient will bathe all body parts with assist levels (OT) Description: LTG: Patient will bathe all body parts with assist levels (OT) Outcome: Not Met (add Reason) Note: Pt still requires assist to reach buttocks due to wounds, per pt, son can assist with this   Problem: RH Dressing Goal: LTG Patient will perform upper body dressing (OT) Description: LTG Patient will perform upper body dressing with assist, with/without cues (OT). Outcome: Not Met (add Reason) Flowsheets (Taken 01/20/2021 0933) LTG: Pt will perform upper body dressing with assistance level of: Independent with assistive device Note: Pt requires SPC to safely retrieve clothing   Problem: RH Toileting Goal: LTG Patient will perform  toileting task (3/3 steps) with assistance level (OT) Description: LTG: Patient will perform toileting task (3/3 steps) with assistance level (OT)  Outcome: Not Met (add Reason) Note: Pt still requires assist to reach buttocks due to wounds, per pt, son can assist with this

## 2021-01-20 NOTE — Progress Notes (Signed)
Occupational Therapy Session Note  Patient Details  Name: Erin Davila MRN: [id number] Date of Birth: 1960/08/27  Today's Date: 01/20/2021 OT Individual Time: 7681-1572 OT Individual Time Calculation (min): 59 min   Short Term Goals: Week 1:  OT Short Term Goal 1 (Week 1): STG = LTGs due to ELOS  Skilled Therapeutic Interventions/Progress Updates:    Pt greeted in bed with no c/o pain. IV running so started tx with making sure pt felt ready for d/c and OT explained grad day + d/c process. Pt will not need a BSC as her bathroom is close enough for her to access via short distance ambulation from bedroom. Already has a SPC. Bed mobility completed unassisted and then pt completed a stand pivot<BSC at Mod I level. Continent B+B void. Assistance needed for posterior perihygiene which she reports son can provide at home. Pt then used the [facility] to gather clothing items from closet and returned to EOB. Bathing/dressing tasks completed at sit<stand level using SPC for standing support as needed. Note that pt was quite particular about setup of sponge bathing beforehand. Participation in ADL tasks required increased time, pt reported she already practiced meal prep + light housekeeping activity in OT. Feels ready for d/c home. At close of session pt used the [facility] to transfer to her recliner, all needs within reach.    Therapy Documentation Precautions:  Precautions Precautions: None Precaution Comments: wound on buttocks Restrictions Weight Bearing Restrictions: No  Pain: Pain Assessment Pain Scale: 0-10 Pain Score: 0-No pain ADL:     Therapy/Group: Individual Therapy  [name] 01/20/2021, 12:46 PM

## 2021-01-20 NOTE — Progress Notes (Signed)
Physical Therapy Session Note  Patient Details  Name: KARALYNN COTTONE MRN: [id number] Date of Birth: 1961/05/06  Today's Date: 01/20/2021 PT Individual Time: 1100-1155 PT Individual Time Calculation (min): 55 min   Short Term Goals: Week 1:  PT Short Term Goal 1 (Week 1): STG = LTG due to LOS  Skilled Therapeutic Interventions/Progress Updates:    Pt received seated in recliner in room, agreeable to PT session. No complaints of pain. Sit to stand and transfers at mod I level with use of SPC throughout session. Toilet transfer with bariatric BSC with SPC at mod I level. Pt is independent for clothing management and pericare. Ambulation 2 x 150 ft, 1 x 200 ft with SPC at mod I level. Sidesteps L/R 2 x 10 ft with SPC at mod I level for hip strengthening. Static standing balance with no UE support performing ball toss against rebounder, 3 x 30 reps to fatigue. Pt left seated in recliner in room with needs in reach at end of session.  Therapy Documentation Precautions:  Precautions Precautions: None Precaution Comments: wound on buttocks Restrictions Weight Bearing Restrictions: No   Therapy/Group: Individual Therapy   Peter Congo, PT, DPT, CSRS  01/20/2021, 12:20 PM

## 2021-01-21 DIAGNOSIS — D638 Anemia in other chronic diseases classified elsewhere: Secondary | ICD-10-CM

## 2021-01-21 LAB — GLUCOSE, CAPILLARY
Glucose-Capillary: 107 mg/dL — ABNORMAL HIGH (ref 70–99)
Glucose-Capillary: 119 mg/dL — ABNORMAL HIGH (ref 70–99)

## 2021-01-21 MED ORDER — VITAMIN D (ERGOCALCIFEROL) 1.25 MG (50000 UNIT) PO CAPS: 50000.0000 [IU] | ORAL_CAPSULE | ORAL | 0 refills | Status: DC

## 2021-01-21 MED ORDER — METHOCARBAMOL 500 MG PO TABS: 500.0000 mg | ORAL_TABLET | Freq: Four times a day (QID) | ORAL | 0 refills | Status: DC | PRN

## 2021-01-21 MED ORDER — AMLODIPINE BESYLATE 2.5 MG PO TABS: 2.5000 mg | ORAL_TABLET | Freq: Every day | ORAL | 0 refills | Status: DC

## 2021-01-21 MED ORDER — LANTUS SOLOSTAR 100 UNIT/ML ~~LOC~~ SOPN: 44.0000 [IU] | PEN_INJECTOR | Freq: Every day | SUBCUTANEOUS | 11 refills | Status: DC

## 2021-01-21 MED ORDER — SIMVASTATIN 40 MG PO TABS: 40.0000 mg | ORAL_TABLET | Freq: Every day | ORAL | 0 refills | Status: AC

## 2021-01-21 MED ORDER — DIPHENHYDRAMINE HCL 25 MG PO CAPS
25.0000 mg | ORAL_CAPSULE | Freq: Four times a day (QID) | ORAL | Status: DC | PRN
  Administered 2021-01-21: 25 mg via ORAL
  Filled 2021-01-21: qty 1

## 2021-01-21 MED ORDER — DIPHENHYDRAMINE HCL 25 MG PO CAPS: 25.0000 mg | ORAL_CAPSULE | Freq: Four times a day (QID) | ORAL | 0 refills | Status: DC | PRN

## 2021-01-21 MED ORDER — OXYCODONE HCL 5 MG PO TABS: 5.0000 mg | ORAL_TABLET | ORAL | 0 refills | Status: DC | PRN

## 2021-01-21 MED ORDER — PANTOPRAZOLE SODIUM 40 MG PO TBEC: 40.0000 mg | DELAYED_RELEASE_TABLET | Freq: Every day | ORAL | 0 refills | Status: AC

## 2021-01-21 MED ORDER — MONTELUKAST SODIUM 10 MG PO TABS: 10.0000 mg | ORAL_TABLET | Freq: Every day | ORAL | 0 refills | Status: AC

## 2021-01-21 MED ORDER — HYOSCYAMINE SULFATE 0.125 MG/5ML PO ELIX: 0.2500 mg | ORAL_SOLUTION | Freq: Four times a day (QID) | ORAL | 0 refills | Status: AC | PRN

## 2021-01-21 MED ORDER — ACETAMINOPHEN 325 MG PO TABS: 650.0000 mg | ORAL_TABLET | Freq: Four times a day (QID) | ORAL | Status: DC | PRN

## 2021-01-21 NOTE — Progress Notes (Signed)
PROGRESS NOTE   Subjective/Complaints: Scheduled to go home today. Family ed performed. HH RN f/u still up in the air per SW  ROS: Patient denies fever, rash, sore throat, blurred vision, nausea, vomiting, diarrhea, cough, shortness of breath or chest pain, joint or back pain, headache, or mood change.    Objective:   No results found. Recent Labs    01/19/21 1144 01/20/21 0722  WBC 6.1 6.3  HGB 8.0* 8.1*  HCT 25.5* 26.4*  PLT 369 369   Recent Labs    01/19/21 1144  NA 138  K 4.7  CL 107  CO2 23  GLUCOSE 136*  BUN 27*  CREATININE 3.21*  CALCIUM 8.9    Intake/Output Summary (Last 24 hours) at 01/21/2021 0951 Last data filed at 01/21/2021 0928 Gross per 24 hour  Intake 592 ml  Output --  Net 592 ml        Physical Exam: Vital Signs Blood pressure 137/68, pulse 89, temperature 99.8 F (37.7 C), temperature source Oral, resp. rate 18, height 5\' 8"  (1.727 m), weight (!) 170.7 kg, last menstrual period 03/26/2015, SpO2 96 %.     Constitutional: No distress . Vital signs reviewed. HEENT: EOMI, oral membranes moist Neck: supple Cardiovascular: RRR without murmur. No JVD    Respiratory/Chest: CTA Bilaterally without wheezes or rales. Normal effort    GI/Abdomen: BS +, non-tender, non-distended Ext: no clubbing, cyanosis, or edema Psych: pleasant and cooperative  Skin:    Comments: buttock wound dressed. Neurological:    Comments: Patient is alert.  No acute distress.  Oriented x3.  Moves all 4's 4+/5. Good sitting balance   Assessment/Plan: 1. Functional deficits which require 3+ hours per day of interdisciplinary therapy in a comprehensive inpatient rehab setting. Physiatrist is providing close team supervision and 24 hour management of active medical problems listed below. Physiatrist and rehab team continue to assess barriers to discharge/monitor patient progress toward functional and medical  goals  Care Tool:  Bathing    Body parts bathed by patient: Right arm, Chest, Left arm, Abdomen, Front perineal area, Right upper leg, Left upper leg, Face, Right lower leg, Left lower leg   Body parts bathed by helper: Buttocks     Bathing assist Assist Level: Minimal Assistance - Patient > 75%     Upper Body Dressing/Undressing Upper body dressing   What is the patient wearing?: Bra, Dress    Upper body assist Assist Level: Independent with assistive device    Lower Body Dressing/Undressing Lower body dressing      What is the patient wearing?: Underwear/pull up     Lower body assist Assist for lower body dressing: Independent with assitive device     Toileting Toileting    Toileting assist Assist for toileting: Minimal Assistance - Patient > 75%     Transfers Chair/bed transfer  Transfers assist  Chair/bed transfer activity did not occur: Safety/medical concerns (No bariatric WC available. Nausea)  Chair/bed transfer assist level: Independent with assistive device Chair/bed transfer assistive device: 03/28/2015   Ambulation assist   Ambulation activity did not occur: Safety/medical concerns (Nausea)  Assist level: Independent with assistive device Assistive device: Cane-straight  Max distance: 200'   Walk 10 feet activity   Assist  Walk 10 feet activity did not occur: Safety/medical concerns (Nausea)  Assist level: Independent with assistive device Assistive device: Cane-straight   Walk 50 feet activity   Assist Walk 50 feet with 2 turns activity did not occur: Safety/medical concerns (Nausea)  Assist level: Independent with assistive device Assistive device: Cane-straight    Walk 150 feet activity   Assist Walk 150 feet activity did not occur: Safety/medical concerns (Nausea)  Assist level: Independent with assistive device Assistive device: Cane-straight    Walk 10 feet on uneven surface  activity   Assist  Walk 10 feet on uneven surfaces activity did not occur: Safety/medical concerns (Nausea)   Assist level: Supervision/Verbal cueing Assistive device: Ship broker Will patient use wheelchair at discharge?: No Type of Wheelchair: Manual Wheelchair activity did not occur: N/A         Wheelchair 50 feet with 2 turns activity    Assist    Wheelchair 50 feet with 2 turns activity did not occur: N/A       Wheelchair 150 feet activity     Assist  Wheelchair 150 feet activity did not occur: N/A       Blood pressure 137/68, pulse 89, temperature 99.8 F (37.7 C), temperature source Oral, resp. rate 18, height 5\' 8"  (1.727 m), weight (!) 170.7 kg, last menstrual period 03/26/2015, SpO2 96 %.  Medical Problem List and Plan: 1.  Severe sepsis/septic shock secondary to necrotizing left gluteal wound.  Status post I&D wound debridement 01/04/2021 per Dr. 03/07/2021.  Follow-up general surgery.  Wound VAC changes as directed             -dc home today. Hopefully will have a HHRN.  -son educated on wound care 2.  Impaired mobility, ambulating 160 feet: discontinue Lovenox             -antiplatelet therapy: N/A 3. Post-surgical pain: continue Oxycodone 5 to 10 mg every 4 hours as needed as well as 15 mg Monday Wednesday Friday with wound VAC changes as needed and Robaxin as needed- using regularly.  7/17- pain controlled- con't regimen 4. Mood: Provide emotional support             -antipsychotic agents: N/A 5. Neuropsych: This patient is capable of making decisions on her own behalf. 6. Skin/Wound Care: Routine skin checks  -now on bid MTD dressing with ABD  -son learned dressing changes  -outpt wound clinic f/u on 7/27 7. Fluids/Electrolytes/Nutrition: encourage PO  -    8.  ID.  Intravenous Zosyn 01/03/2021 and transitioned to Augmentin 01/10/2021. 9.  Diabetes mellitus.  Hemoglobin A1c 12.3.    CBG (last 3)  Recent Labs    01/20/21 1651  01/20/21 2103 01/21/21 0602  GLUCAP 119* 186* 107*  -Continue Lantus 44U qam for now.--  7/17- BG's controlled- con't regimen 10.  AKI/multifactorial.   .  Renal ultrasound unremarkable. Appreciate renal f/u by nephrology. Encouraged adequate hydration.   -7/15 Cr down to 3.39   -avoid hypotensive episodes  7/17- Cr down to 3.21  11.  Obesity.  BMI 60.14.  Dietary follow-up 12. Vitamin D deficiency: continue ergocalciferol 50,00U weekly for 7 weeks 13. Magnesium deficiency: magnesium 4gm IV 7/9, normal 7/10.  14. Hypokalemia: klor 9/9 on 7/9, normal on 7/10, 3.8 7/11 15. HTN:      -reasonable control. Want to avoid hypotension  -Continue amlodipine to 2.5mg   qd 16. Urinary frequency/spasms: bothersome to her: urine cloudy and with increased WBC and rare bacteria: discussed with pharmacy broadening abx to meropenum -ucx negative/<10k colonies, wbc's down to 11.9 -dc meropenum  17. Anemia: hgb holding at 8.4. MCV 100  -likely due chronic disease/illness. IV Ferrlicit  -hgb holding at 8.1, received iron  18. Thrombocytopenia: platelets 368k 7/15   7/17- Plts 369- stable- con't to monitor 19. Reflux: added scheduled protonix     LOS: 11 days A FACE TO FACE EVALUATION WAS PERFORMED  Ranelle Oyster 01/21/2021, 9:51 AM

## 2021-01-21 NOTE — Progress Notes (Signed)
Patient discharged home with family this afternoon. Information packet given to patient per PA, no questions noted. Taken down via wheelchair. Erin Davila A [name]  

## 2021-01-21 NOTE — Progress Notes (Signed)
Patient ID: Erin Davila, female   DOB: Aug 28, 1960, 60 y.o.   MRN: [id number] Still attempting to get home health agency to see pt for RN issues. Waiting one last one. Made pt aware Friday regarding this. She does have a Wound Clinic appt 7/27 and son was to be educated on her dressing change daily.

## 2021-01-21 NOTE — Progress Notes (Signed)
Inpatient Rehabilitation Care Coordinator Discharge Note  The overall goal for the admission was met for:   Discharge location: [place]  Length of Stay: Yes-11 DAYS  Discharge activity level: Yes-MOD/I LEVEL  Home/community participation: Yes  Services provided included: MD, RD, PT, OT, RN, CM, Pharmacy, and SW  Financial Services: Private Insurance: [place] offered to/list presented to:PT  Follow-up services arranged: Home Health: [place] [facility]=PT & RN and Patient/Family has no preference for HH/DME agencies UNSURE IF ACCEPTED STILL WAITING AT TIME OF PT'S DISCHARGE. NO OTHER AGENCY WOULD ACCEPT PT DUE TO INSURANCE AND RN SHORTAGE  Comments (or additional information): SON TAUGHT DRESSING CHANGES AND FEELS CONFIDENT REGARDING THIS. NO EQUIPMENT NEEDS. HAS WOUND CLINIC APPT FOR 7/27 1:15 PM  Patient/Family verbalized understanding of follow-up arrangements: Yes  Individual responsible for coordination of the follow-up plan: SELF [phone]  Confirmed correct DME delivered: Elease Hashimoto 01/21/2021    Satoria Dunlop, Gardiner Rhyme

## 2021-01-22 ENCOUNTER — Telehealth: Payer: Self-pay | Admitting: *Deleted

## 2021-01-22 NOTE — Telephone Encounter (Signed)
Mrs Mask called and reports that her IV site is swollen and appears inflamed. I have directed her to apply warm compresses to the site for 15 min periodically. I will send to Dr Carlis Abbott and let her know if further instructions.

## 2021-01-23 NOTE — Telephone Encounter (Signed)
I let her know Dr Carlis Abbott agrees with the compresses. It is looking better today, but still sore.  She is going to have work papers faxed over to our office to be filled out for disability.

## 2021-01-25 ENCOUNTER — Telehealth: Payer: Self-pay

## 2021-01-25 NOTE — Telephone Encounter (Signed)
Donita, RN from [facility] for [facility] 1wk4. Didn't get insulin due to insurance didn't approve. Level 1 interaction Amolpidine and Simvastatin. Will approve orders and contact pharmacy about insulin. Called pharmacy and insurance will not pay for Lantus but will pay for Bafaglar, it is preferred. If want to change send in a new prescription.

## 2021-01-28 NOTE — Telephone Encounter (Signed)
Erin Able, MD      PCP - General, Family Medicine    Since 12/24/2020    [phone]  This is the patient PCP

## 2021-01-30 ENCOUNTER — Encounter (HOSPITAL_BASED_OUTPATIENT_CLINIC_OR_DEPARTMENT_OTHER): Payer: 59 | Admitting: Physician Assistant

## 2021-01-30 NOTE — Telephone Encounter (Signed)
Spoke to patient and she states that her insulin has been taking care of. She has her insulin.

## 2021-02-01 ENCOUNTER — Telehealth: Payer: Self-pay | Admitting: *Deleted

## 2021-02-01 NOTE — Telephone Encounter (Signed)
Katie RN [facility] called to report that Erin Davila's BP today was 190/100 and 180/100.  Calling for advice.  I have left the nurse a vm about needing to call the primary care office. Dr Carlis Abbott is out of the office.  I called and spoke with Erin Davila and she is feeling fine, no symptoms. I have advised her to call her pcp- she needs to be seen post hosp.  If she develops symptoms she will need to go to Urgent Care or ED.  She understands.

## 2021-02-08 DIAGNOSIS — Z4800 Encounter for change or removal of nonsurgical wound dressing: Secondary | ICD-10-CM

## 2021-02-08 DIAGNOSIS — Z79899 Other long term (current) drug therapy: Secondary | ICD-10-CM

## 2021-02-08 DIAGNOSIS — L0231 Cutaneous abscess of buttock: Secondary | ICD-10-CM

## 2021-02-08 DIAGNOSIS — Z6841 Body Mass Index (BMI) 40.0 and over, adult: Secondary | ICD-10-CM

## 2021-02-08 DIAGNOSIS — N189 Chronic kidney disease, unspecified: Secondary | ICD-10-CM | POA: Diagnosis not present

## 2021-02-08 DIAGNOSIS — E785 Hyperlipidemia, unspecified: Secondary | ICD-10-CM

## 2021-02-08 DIAGNOSIS — K219 Gastro-esophageal reflux disease without esophagitis: Secondary | ICD-10-CM

## 2021-02-08 DIAGNOSIS — E1122 Type 2 diabetes mellitus with diabetic chronic kidney disease: Secondary | ICD-10-CM | POA: Diagnosis not present

## 2021-02-08 DIAGNOSIS — Z794 Long term (current) use of insulin: Secondary | ICD-10-CM

## 2021-02-08 DIAGNOSIS — Z9181 History of falling: Secondary | ICD-10-CM

## 2021-02-08 DIAGNOSIS — I129 Hypertensive chronic kidney disease with stage 1 through stage 4 chronic kidney disease, or unspecified chronic kidney disease: Secondary | ICD-10-CM | POA: Diagnosis not present

## 2021-02-08 DIAGNOSIS — Z79891 Long term (current) use of opiate analgesic: Secondary | ICD-10-CM

## 2021-02-19 ENCOUNTER — Other Ambulatory Visit: Payer: Self-pay

## 2021-02-19 ENCOUNTER — Encounter (HOSPITAL_BASED_OUTPATIENT_CLINIC_OR_DEPARTMENT_OTHER): Payer: 59 | Attending: Physician Assistant | Admitting: Internal Medicine

## 2021-02-19 DIAGNOSIS — X58XXXA Exposure to other specified factors, initial encounter: Secondary | ICD-10-CM | POA: Diagnosis not present

## 2021-02-19 DIAGNOSIS — I10 Essential (primary) hypertension: Secondary | ICD-10-CM | POA: Insufficient documentation

## 2021-02-19 DIAGNOSIS — E119 Type 2 diabetes mellitus without complications: Secondary | ICD-10-CM | POA: Diagnosis not present

## 2021-02-19 DIAGNOSIS — E1151 Type 2 diabetes mellitus with diabetic peripheral angiopathy without gangrene: Secondary | ICD-10-CM | POA: Diagnosis not present

## 2021-02-19 DIAGNOSIS — S31829A Unspecified open wound of left buttock, initial encounter: Secondary | ICD-10-CM | POA: Diagnosis present

## 2021-02-19 NOTE — Progress Notes (Signed)
Erin Davila, Erin Davila ([id number]) Visit Report for 02/19/2021 Abuse/Suicide Risk Screen Details Patient Name: Date of Service: Erin Luster NDA G. 02/19/2021 1:15 PM Medical Record Number: [id number] Patient Account Number: [id number] Date of Birth/Sex: Treating RN: 08-11-1960 (60 y.o. Female) Shawn Stall Primary Care Erin Davila: Erin Davila Other Clinician: Referring Erin Davila: Treating Erin Davila Erin Davila: 0 Abuse/Suicide Risk Screen Items Answer ABUSE RISK SCREEN: Has anyone close to you tried to hurt or harm you recentlyo No Do you feel uncomfortable with anyone in your familyo No Has anyone forced you do things that you didnt want to doo No Electronic Signature(s) Signed: 02/19/2021 5:19:53 PM By: Shawn Stall Entered By: Shawn Stall on 02/19/2021 13:51:30 -------------------------------------------------------------------------------- Activities of Daily Living Details Patient Name: Date of Service: Erin Luster NDA G. 02/19/2021 1:15 PM Medical Record Number: [id number] Patient Account Number: [id number] Date of Birth/Sex: Treating RN: [birth year] (60 y.o. Female) Shawn Stall Primary Care Erin Davila: Erin Davila Other Clinician: Referring Erin Davila: Treating Imer Foxworth/Extender: Erin Davila Erin Davila: 0 Activities of Daily Living Items Answer Activities of Daily Living (Please select one for each item) Drive Automobile Completely Davila T Medications ake Completely Davila Use T elephone Completely Davila Care for Appearance Completely Davila Use T oilet Completely Davila Bath / Shower Completely Davila Dress Self Completely Davila Feed Self Completely Davila Walk Completely Davila Get In / Out Bed Completely Davila Housework Completely Davila Prepare Meals Completely Davila Handle Money Completely Davila Shop for Self Completely Davila Electronic Signature(s) Signed: 02/19/2021 5:19:53 PM By: Shawn Stall Entered By:  Shawn Stall on 02/19/2021 13:51:50 -------------------------------------------------------------------------------- Education Screening Details Patient Name: Date of Service: Erin Luster NDA G. 02/19/2021 1:15 PM Medical Record Number: [id number] Patient Account Number: [id number] Date of Birth/Sex: Treating RN: [birth year] (60 y.o. Female) Shawn Stall Primary Care Allaina Brotzman: Erin Davila Other Clinician: Referring Erin Davila: Treating Erin Davila/Extender: Erin Davila in Davila: 0 Primary Learner Assessed: Patient Learning Preferences/Education Level/Primary Language Learning Preference: Explanation, Demonstration, Printed Material Highest Education Level: College or Above Preferred Language: English Cognitive Barrier Language Barrier: No Translator Needed: No Memory Deficit: No Emotional Barrier: No Cultural/Religious Beliefs Affecting Medical Care: No Physical Barrier Impaired Vision: No Impaired Hearing: No Decreased Hand dexterity: No Knowledge/Comprehension Knowledge Level: High Comprehension Level: High Ability to understand written instructions: High Ability to understand verbal instructions: High Motivation Anxiety Level: Calm Cooperation: Cooperative Education Importance: Acknowledges Need Interest in Health Problems: Asks Questions Perception: Coherent Willingness to Engage in Self-Management High Activities: Readiness to Engage in Self-Management High Activities: Electronic Signature(s) Signed: 02/19/2021 5:19:53 PM By: Shawn Stall Entered By: Shawn Stall on 02/19/2021 13:52:29 -------------------------------------------------------------------------------- Fall Risk Assessment Details Patient Name: Date of Service: Erin Dross, [name] 02/19/2021 1:15 PM Medical Record Number: [id number] Patient Account Number: [id number] Date of Birth/Sex: Treating RN: 16-May-1961 (60 y.o. Female) Shawn Stall Primary Care Erin Davila: Erin Davila Other Clinician: Referring Erin Davila: Treating Erin Davila/Extender: Erin Davila Erin Davila: 0 Fall Risk Assessment Items Have you had 2 or more falls in the last 12 monthso 0 No Have you had any fall that resulted in injury in the last 12 monthso 0 No FALLS RISK SCREEN History of falling - immediate or within 3 months 0 No Secondary diagnosis (Do you have 2 or more medical diagnoseso) 0 No Ambulatory aid None/bed rest/wheelchair/nurse 0 No Crutches/cane/walker 15 Yes Furniture 0 No Intravenous therapy Access/Saline/Heparin Lock 0 No Gait/Transferring Normal/ bed rest/ wheelchair 0 Yes Weak (short steps with or  without shuffle, stooped but Davila to lift head while walking, may seek 0 No support from furniture) Impaired (short steps with shuffle, may have difficulty arising from chair, head down, impaired 0 No balance) Mental Status Oriented to own ability 0 Yes Electronic Signature(s) Signed: 02/19/2021 5:19:53 PM By: Shawn Stall Entered By: Shawn Stall on 02/19/2021 13:52:40 -------------------------------------------------------------------------------- Foot Assessment Details Patient Name: Date of Service: Erin Luster NDA G. 02/19/2021 1:15 PM Medical Record Number: [id number] Patient Account Number: [id number] Date of Birth/Sex: Treating RN: [birth year] (60 y.o. Female) Shawn Stall Primary Care Erin Davila: Erin Davila Other Clinician: Referring Erin Davila: Treating Erin Davila/Extender: Erin Davila Erin Davila: 0 Foot Assessment Items Site Locations + = Sensation present, - = Sensation absent, C = Callus, U = Ulcer R = Redness, W = Warmth, M = Maceration, PU = Pre-ulcerative lesion F = Fissure, S = Swelling, D = Dryness Assessment Right: Left: Other Deformity: No No Prior Foot Ulcer: No No Prior Amputation: No No Charcot Joint: No No Ambulatory Status: Ambulatory With Help Assistance Device: Cane Gait:  Steady Notes No BLE wounds. Electronic Signature(s) Signed: 02/19/2021 5:19:53 PM By: Shawn Stall Entered By: Shawn Stall on 02/19/2021 13:53:03 -------------------------------------------------------------------------------- Nutrition Risk Screening Details Patient Name: Date of Service: Erin Luster NDA G. 02/19/2021 1:15 PM Medical Record Number: [id number] Patient Account Number: [id number] Date of Birth/Sex: Treating RN: [birth year] (60 y.o. Female) Shawn Stall Primary Care Atharva Mirsky: Erin Davila Other Clinician: Referring Titilayo Hagans: Treating Kayton Dunaj/Extender: Earnie Larsson, Betti Erin Davila: 0 Height (in): 68 Weight (lbs): 370 Body Mass Index (BMI): 56.3 Nutrition Risk Screening Items Score Screening NUTRITION RISK SCREEN: I have an illness or condition that made me change the kind and/or amount of food I eat 2 Yes I eat fewer than two meals per day 0 No I eat few fruits and vegetables, or milk products 0 No I have three or more drinks of beer, liquor or wine almost every day 0 No I have tooth or mouth problems that make it hard for me to eat 0 No I don't always have enough money to buy the food I need 0 No I eat alone most of the time 0 No I take three or more different prescribed or over-the-counter drugs a day 1 Yes Without wanting to, I have lost or gained 10 pounds in the last six months 0 No I am not always physically Davila to shop, cook and/or feed myself 0 No Nutrition Protocols Good Risk Protocol Provide education on elevated blood Moderate Risk Protocol 0 sugars and impact on wound healing, as applicable High Risk Proctocol Risk Level: Moderate Risk Score: 3 Electronic Signature(s) Signed: 02/19/2021 5:19:53 PM By: Shawn Stall Entered By: Shawn Stall on 02/19/2021 13:52:51
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
## 2021-02-26 ENCOUNTER — Encounter (HOSPITAL_BASED_OUTPATIENT_CLINIC_OR_DEPARTMENT_OTHER): Payer: 59 | Admitting: Internal Medicine

## 2021-02-26 ENCOUNTER — Other Ambulatory Visit: Payer: Self-pay

## 2021-02-26 DIAGNOSIS — S31829D Unspecified open wound of left buttock, subsequent encounter: Secondary | ICD-10-CM

## 2021-02-26 DIAGNOSIS — E119 Type 2 diabetes mellitus without complications: Secondary | ICD-10-CM | POA: Diagnosis not present

## 2021-02-26 DIAGNOSIS — I10 Essential (primary) hypertension: Secondary | ICD-10-CM

## 2021-02-26 DIAGNOSIS — S31829A Unspecified open wound of left buttock, initial encounter: Secondary | ICD-10-CM | POA: Diagnosis not present
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
## 2021-03-05 ENCOUNTER — Other Ambulatory Visit: Payer: Self-pay

## 2021-03-05 ENCOUNTER — Encounter (HOSPITAL_BASED_OUTPATIENT_CLINIC_OR_DEPARTMENT_OTHER): Payer: 59 | Admitting: Internal Medicine

## 2021-03-05 DIAGNOSIS — I10 Essential (primary) hypertension: Secondary | ICD-10-CM | POA: Diagnosis not present

## 2021-03-05 DIAGNOSIS — S31829A Unspecified open wound of left buttock, initial encounter: Secondary | ICD-10-CM | POA: Diagnosis not present

## 2021-03-05 DIAGNOSIS — S31829D Unspecified open wound of left buttock, subsequent encounter: Secondary | ICD-10-CM | POA: Diagnosis not present

## 2021-03-05 DIAGNOSIS — E119 Type 2 diabetes mellitus without complications: Secondary | ICD-10-CM
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
## 2021-03-08 ENCOUNTER — Encounter: Payer: 59 | Attending: Physical Medicine and Rehabilitation | Admitting: Physical Medicine and Rehabilitation

## 2021-03-08 ENCOUNTER — Other Ambulatory Visit: Payer: Self-pay

## 2021-03-08 VITALS — BP 158/92 | HR 87 | Temp 98.3°F | Ht 68.0 in | Wt 377.0 lb

## 2021-03-08 DIAGNOSIS — I10 Essential (primary) hypertension: Secondary | ICD-10-CM | POA: Diagnosis not present

## 2021-03-08 DIAGNOSIS — R5381 Other malaise: Secondary | ICD-10-CM | POA: Diagnosis not present

## 2021-03-08 DIAGNOSIS — Z6841 Body Mass Index (BMI) 40.0 and over, adult: Secondary | ICD-10-CM | POA: Diagnosis present

## 2021-03-08 DIAGNOSIS — L0231 Cutaneous abscess of buttock: Secondary | ICD-10-CM | POA: Diagnosis not present

## 2021-03-08 NOTE — Progress Notes (Signed)
Subjective:    Patient ID: Erin Davila, female    DOB: 1960-09-11, 60 y.o.   MRN: [id number]  HPI Erin Davila is a 60 year old woman who presents for follow-up of CIR admission for debility.  She ran into issue on discharge in getting her blood pressure and diabetes medication. She has had three medication changes since she left the hospital. She was started on amlodipine and was started on Olmesartan  Just had labs- kidney function was down to 2. She has been following with nephrology.   She has been eating more foods that have magnesium and potassium.   Pain Inventory Average Pain 2 Pain Right Now 3 My pain is intermittent  In the last 24 hours, has pain interfered with the following? General activity 2 Relation with others 2 Enjoyment of life 3 What TIME of day is your pain at its worst? evening Sleep (in general) Fair  Pain is worse with: walking and standing Pain improves with: rest, heat/ice, and medication Relief from Meds:  not sure  walk without assistance use a cane how many minutes can you walk? 10-12 mins ability to climb steps?  yes do you drive?  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 Social History   Socioeconomic History   Marital status: Single    Spouse name: Not on file   Number of children: Not on file   Years of education: Not on file   Highest education level: Not on file  Occupational History   Not on file  Tobacco Use   Smoking status: Never   Smokeless tobacco: Never  Vaping Use   Vaping Use: Never used  Substance and Sexual Activity   Alcohol use: No   Drug use: No   Sexual activity: Not on file  Other Topics Concern   Not on file  Social History Narrative   Not on file   Social Determinants of Health   Financial Resource Strain: Not on file  Food Insecurity: Not on file  Transportation Needs:  Not on file  Physical Activity: Not on file  Stress: Not on file  Social Connections: Not on file   Past Surgical History:  Procedure Laterality Date   CESAREAN SECTION     INCISION AND DRAINAGE ABSCESS N/A 01/04/2021   Procedure: INCISION AND DRAINAGE LEFT BUTTOCK ABSCESS;  Surgeon: Luretha Murphy, MD;  Location: [facility];  Service: General;  Laterality: N/A;   Past Medical History:  Diagnosis Date   Allergy    Diabetes mellitus without complication (HCC)    Hyperlipidemia    Hypertension    BP (!) 158/92 (BP Location: Right Arm)   Pulse 87   Temp 98.3 F (36.8 C)   Ht 5\' 8"  (1.727 m)   Wt (!) 377 lb (171 kg)   LMP 03/26/2015   SpO2 97%   BMI 57.32 kg/m   Opioid Risk Score:   Fall Risk Score:  `1  Depression screen PHQ 2/9  Depression screen [facility] 2/9 03/27/2015 02/01/2015  Decreased Interest 0 0  Down, Depressed, Hopeless 0 0  PHQ - 2 Score 0 0       Review of Systems  Constitutional: Negative.   HENT: Negative.    Eyes: Negative.   Respiratory: Negative.    Cardiovascular: Negative.   Gastrointestinal: Negative.   Endocrine: Negative.  Genitourinary: Negative.   Musculoskeletal:        Right and left hip pain , pain in left glute  Skin: Negative.   Allergic/Immunologic: Negative.   Neurological: Negative.   Hematological: Negative.   Psychiatric/Behavioral: Negative.        Objective:   Physical Exam Gen: no distress, normal appearing, weight 377 lbs, BMI 57.32, 158/92 HEENT: oral mucosa pink and moist, NCAT Cardio: Reg rate Chest: normal effort, normal rate of breathing Abd: soft, non-distended Ext: minimal lower extremity edema Psych: pleasant, normal affect Skin: intact Neuro: Alert and oriented    Assessment & Plan:   Erin Davila is a 60 year old woman who presents or CIR follow-up for debility.  1) Debility -continue HEP  2) Wound -continue daily wound dressing, healing well  3) HTN: -BP is 158/92 today.  -Advised checking BP  daily at home and logging results to bring into follow-up appointment with her PCP and myself. -Reviewed BP meds today.  -Advised regarding healthy foods that can help lower blood pressure and provided with a list: 1) citrus foods- high in vitamins and minerals 2) salmon and other fatty fish - reduces inflammation and oxylipins 3) swiss chard (leafy green)- high level of nitrates 4) pumpkin seeds- one of the best natural sources of magnesium 5) Beans and lentils- high in fiber, magnesium, and potassium 6) Berries- high in flavonoids 7) Amaranth (whole grain, can be cooked similarly to rice and oats)- high in magnesium and fiber 8) Pistachios- even more effective at reducing BP than other nuts 9) Carrots- high in phenolic compounds that relax blood vessels and reduce inflammation 10) Celery- contain phthalides that relax tissues of arterial walls 11) Tomatoes- can also improve cholesterol and reduce risk of heart disease 12) Broccoli- good source of magnesium, calcium, and potassium 13) Greek yogurt: high in potassium and calcium 14) Herbs and spices: Celery seed, cilantro, saffron, lemongrass, black cumin, ginseng, cinnamon, cardamom, sweet basil, and ginger 15) Chia and flax seeds- also help to lower cholesterol and blood sugar 16) Beets- high levels of nitrates that relax blood vessels  17) spinach and bananas- high in potassium  -Provided lise of supplements that can help with hypertension:  1) magnesium: one high quality brand is Bioptemizers since it contains all 7 types of magnesium, otherwise over the counter magnesium gluconate 400mg  is a good option 2) B vitamins 3) vitamin D 4) potassium 5) CoQ10 6) L-arginine 7) Vitamin C 8) Beetroot -Educated that goal BP is 120/80. -Made goal to incorporate some of the above foods into diet.    4) Obesity: -Educated that current weight is 377 lbs and current BMI is 57.32 -Educated regarding health benefits of weight loss- for pain,  general health, chronic disease prevention, immune health, mental health.  -Will monitor weight every visit.  -Consider Roobois tea daily.  -Discussed the benefits of intermittent fasting. -Discussed foods that can assist in weight loss: 1) leafy greens- high in fiber and nutrients 2) dark chocolate- improves metabolism (if prefer sweetened, best to sweeten with honey instead of sugar).  3) cruciferous vegetables- high in fiber and protein 4) full fat yogurt: high in healthy fat, protein, calcium, and probiotics 5) apples- high in a variety of phytochemicals 6) nuts- high in fiber and protein that increase feelings of fullness 7) grapefruit: rich in nutrients, antioxidants, and fiber (not to be taken with anticoagulation) 8) beans- high in protein and fiber 9) salmon- has high quality protein and healthy fats 10) green tea- rich  in polyphenols 11) eggs- rich in choline and vitamin D 12) tuna- high protein, boosts metabolism 13) avocado- decreases visceral abdominal fat 14) chicken (pasture raised): high in protein and iron 15) blueberries- reduce abdominal fat and cholesterol 16) whole grains- decreases calories retained during digestion, speeds metabolism 17) chia seeds- curb appetite 18) chilies- increases fat metabolism  -Discussed supplements that can be used:  1) Metatrim 400mg  BID 30 minutes before breakfast and dinner  2) Sphaeranthus indicus and Garcinia mangostana (combinations of these and #1 can be found in capsicum and zychrome  3) green coffee bean extract 400mg  twice per day or Irvingia (african mango) 150 to 300mg  twice per day.

## 2021-03-08 NOTE — Patient Instructions (Addendum)
Foods for blood pressure: ) citrus foods- high in vitamins and minerals 2) salmon and other fatty fish - reduces inflammation and oxylipins 3) swiss chard (leafy green)- high level of nitrates 4) pumpkin seeds- one of the best natural sources of magnesium 5) Beans and lentils- high in fiber, magnesium, and potassium 6) Berries- high in flavonoids 7) Amaranth (whole grain, can be cooked similarly to rice and oats)- high in magnesium and fiber 8) Pistachios- even more effective at reducing BP than other nuts 9) Carrots- high in phenolic compounds that relax blood vessels and reduce inflammation 10) Celery- contain phthalides that relax tissues of arterial walls 11) Tomatoes- can also improve cholesterol and reduce risk of heart disease 12) Broccoli- good source of magnesium, calcium, and potassium 13) Greek yogurt: high in potassium and calcium 14) Herbs and spices: Celery seed, cilantro, saffron, lemongrass, black cumin, ginseng, cinnamon, cardamom, sweet basil, and ginger 15) Chia and flax seeds- also help to lower cholesterol and blood sugar 16) Beets- high levels of nitrates that relax blood vessels  17) spinach and bananas- high in potassium  -Provided lise of supplements that can help with hypertension:  1) magnesium: one high quality brand is Bioptemizers since it contains all 7 types of magnesium, otherwise over the counter magnesium gluconate 400mg  is a good option 2) B vitamins 3) vitamin D 4) potassium 5) CoQ10 6) L-arginine 7) Vitamin C 8) Beetroot -Educated that goal BP is 120/80. -Made goal to incorporate some of the above foods into diet.    Obesity:  -Consider Roobois tea daily.  -Discussed the benefits of intermittent fasting. -Discussed foods that can assist in weight loss: 1) leafy greens- high in fiber and nutrients 2) dark chocolate- improves metabolism (if prefer sweetened, best to sweeten with honey instead of sugar).  3) cruciferous vegetables- high in fiber  and protein 4) full fat yogurt: high in healthy fat, protein, calcium, and probiotics 5) apples- high in a variety of phytochemicals 6) nuts- high in fiber and protein that increase feelings of fullness 7) grapefruit: rich in nutrients, antioxidants, and fiber (not to be taken with anticoagulation) 8) beans- high in protein and fiber 9) salmon- has high quality protein and healthy fats 10) green tea- rich in polyphenols 11) eggs- rich in choline and vitamin D 12) tuna- high protein, boosts metabolism 13) avocado- decreases visceral abdominal fat 14) chicken (pasture raised): high in protein and iron 15) blueberries- reduce abdominal fat and cholesterol 16) whole grains- decreases calories retained during digestion, speeds metabolism 17) chia seeds- curb appetite 18) chilies- increases fat metabolism  -Discussed supplements that can be used:  1) Metatrim 400mg  BID 30 minutes before breakfast and dinner  2) Sphaeranthus indicus and Garcinia mangostana (combinations of these and #1 can be found in capsicum and zychrome

## 2021-03-19 ENCOUNTER — Encounter (HOSPITAL_BASED_OUTPATIENT_CLINIC_OR_DEPARTMENT_OTHER): Payer: 59 | Attending: Internal Medicine | Admitting: Internal Medicine

## 2021-03-19 ENCOUNTER — Other Ambulatory Visit: Payer: Self-pay

## 2021-03-19 DIAGNOSIS — I10 Essential (primary) hypertension: Secondary | ICD-10-CM | POA: Insufficient documentation

## 2021-03-19 DIAGNOSIS — X58XXXD Exposure to other specified factors, subsequent encounter: Secondary | ICD-10-CM | POA: Insufficient documentation

## 2021-03-19 DIAGNOSIS — S31829D Unspecified open wound of left buttock, subsequent encounter: Secondary | ICD-10-CM | POA: Diagnosis not present

## 2021-03-19 DIAGNOSIS — E119 Type 2 diabetes mellitus without complications: Secondary | ICD-10-CM | POA: Diagnosis not present

## 2021-03-19 DIAGNOSIS — Z833 Family history of diabetes mellitus: Secondary | ICD-10-CM | POA: Insufficient documentation

## 2021-03-19 DIAGNOSIS — Z794 Long term (current) use of insulin: Secondary | ICD-10-CM | POA: Insufficient documentation
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
## 2021-04-02 ENCOUNTER — Encounter (HOSPITAL_BASED_OUTPATIENT_CLINIC_OR_DEPARTMENT_OTHER): Payer: 59 | Admitting: Internal Medicine

## 2021-04-02 ENCOUNTER — Other Ambulatory Visit: Payer: Self-pay

## 2021-04-02 DIAGNOSIS — I10 Essential (primary) hypertension: Secondary | ICD-10-CM | POA: Diagnosis not present

## 2021-04-02 DIAGNOSIS — E119 Type 2 diabetes mellitus without complications: Secondary | ICD-10-CM | POA: Diagnosis not present

## 2021-04-02 DIAGNOSIS — S31829D Unspecified open wound of left buttock, subsequent encounter: Secondary | ICD-10-CM
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
## 2021-04-16 ENCOUNTER — Other Ambulatory Visit: Payer: Self-pay

## 2021-04-16 ENCOUNTER — Encounter (HOSPITAL_BASED_OUTPATIENT_CLINIC_OR_DEPARTMENT_OTHER): Payer: 59 | Attending: Internal Medicine | Admitting: Internal Medicine

## 2021-04-16 DIAGNOSIS — Z09 Encounter for follow-up examination after completed treatment for conditions other than malignant neoplasm: Secondary | ICD-10-CM | POA: Insufficient documentation

## 2021-04-16 DIAGNOSIS — S31829D Unspecified open wound of left buttock, subsequent encounter: Secondary | ICD-10-CM

## 2021-04-16 DIAGNOSIS — I10 Essential (primary) hypertension: Secondary | ICD-10-CM | POA: Insufficient documentation

## 2021-04-16 DIAGNOSIS — Z794 Long term (current) use of insulin: Secondary | ICD-10-CM | POA: Insufficient documentation

## 2021-04-16 DIAGNOSIS — Z872 Personal history of diseases of the skin and subcutaneous tissue: Secondary | ICD-10-CM | POA: Diagnosis not present

## 2021-04-16 DIAGNOSIS — E119 Type 2 diabetes mellitus without complications: Secondary | ICD-10-CM | POA: Insufficient documentation
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
## 2022-03-21 ENCOUNTER — Other Ambulatory Visit (HOSPITAL_COMMUNITY): Payer: Self-pay | Admitting: *Deleted

## 2022-03-24 ENCOUNTER — Encounter (HOSPITAL_COMMUNITY)
Admission: RE | Admit: 2022-03-24 | Discharge: 2022-03-24 | Disposition: A | Discharge status: Home / Self Care | Payer: 59 | Source: Ambulatory Visit | Attending: Internal Medicine | Admitting: Internal Medicine

## 2022-03-24 DIAGNOSIS — D631 Anemia in chronic kidney disease: Secondary | ICD-10-CM | POA: Diagnosis present

## 2022-03-24 DIAGNOSIS — N189 Chronic kidney disease, unspecified: Secondary | ICD-10-CM | POA: Insufficient documentation

## 2022-03-24 MED ORDER — SODIUM CHLORIDE 0.9 % IV SOLN
510.0000 mg | INTRAVENOUS | Status: DC
  Administered 2022-03-24: 510 mg via INTRAVENOUS
  Filled 2022-03-24: qty 510

## 2022-03-31 ENCOUNTER — Encounter (HOSPITAL_COMMUNITY)
Admission: RE | Admit: 2022-03-31 | Discharge: 2022-03-31 | Disposition: A | Discharge status: Home / Self Care | Payer: 59 | Source: Ambulatory Visit | Attending: Internal Medicine | Admitting: Internal Medicine

## 2022-03-31 DIAGNOSIS — N189 Chronic kidney disease, unspecified: Secondary | ICD-10-CM | POA: Diagnosis not present

## 2022-03-31 MED ORDER — SODIUM CHLORIDE 0.9 % IV SOLN
510.0000 mg | INTRAVENOUS | Status: DC
  Administered 2022-03-31: 510 mg via INTRAVENOUS
  Filled 2022-03-31: qty 510

## 2023-01-05 ENCOUNTER — Other Ambulatory Visit (HOSPITAL_COMMUNITY): Payer: Self-pay

## 2023-01-07 ENCOUNTER — Encounter (HOSPITAL_COMMUNITY)
Admission: RE | Admit: 2023-01-07 | Discharge: 2023-01-07 | Disposition: A | Discharge status: Home / Self Care | Payer: 59 | Source: Ambulatory Visit | Attending: Internal Medicine | Admitting: Internal Medicine

## 2023-01-07 DIAGNOSIS — N189 Chronic kidney disease, unspecified: Secondary | ICD-10-CM | POA: Diagnosis not present

## 2023-01-07 DIAGNOSIS — D631 Anemia in chronic kidney disease: Secondary | ICD-10-CM | POA: Diagnosis present

## 2023-01-07 MED ORDER — SODIUM CHLORIDE 0.9 % IV SOLN
510.0000 mg | INTRAVENOUS | Status: DC
  Administered 2023-01-07: 510 mg via INTRAVENOUS
  Filled 2023-01-07: qty 510

## 2023-01-14 ENCOUNTER — Encounter (HOSPITAL_COMMUNITY)
Admission: RE | Admit: 2023-01-14 | Discharge: 2023-01-14 | Disposition: A | Discharge status: Home / Self Care | Payer: 59 | Source: Ambulatory Visit | Attending: Internal Medicine | Admitting: Internal Medicine

## 2023-01-14 DIAGNOSIS — N189 Chronic kidney disease, unspecified: Secondary | ICD-10-CM | POA: Diagnosis not present

## 2023-01-14 MED ORDER — SODIUM CHLORIDE 0.9 % IV SOLN
510.0000 mg | INTRAVENOUS | Status: DC
  Administered 2023-01-14: 510 mg via INTRAVENOUS
  Filled 2023-01-14: qty 17

## 2023-03-02 ENCOUNTER — Other Ambulatory Visit: Payer: Self-pay | Admitting: *Deleted

## 2023-03-02 DIAGNOSIS — N186 End stage renal disease: Secondary | ICD-10-CM

## 2023-03-04 ENCOUNTER — Other Ambulatory Visit (HOSPITAL_COMMUNITY): Payer: Self-pay | Admitting: *Deleted

## 2023-03-04 DIAGNOSIS — D631 Anemia in chronic kidney disease: Secondary | ICD-10-CM

## 2023-03-05 ENCOUNTER — Ambulatory Visit (HOSPITAL_COMMUNITY)
Admission: RE | Admit: 2023-03-05 | Discharge: 2023-03-05 | Disposition: A | Discharge status: Home / Self Care | Payer: 59 | Source: Ambulatory Visit | Attending: Internal Medicine | Admitting: Internal Medicine

## 2023-03-05 DIAGNOSIS — D631 Anemia in chronic kidney disease: Secondary | ICD-10-CM | POA: Insufficient documentation

## 2023-03-05 DIAGNOSIS — N189 Chronic kidney disease, unspecified: Secondary | ICD-10-CM | POA: Insufficient documentation

## 2023-03-05 LAB — POCT HEMOGLOBIN-HEMACUE: Hemoglobin: 9.1 g/dL — ABNORMAL LOW (ref 12.0–15.0)

## 2023-03-05 MED ORDER — EPOETIN ALFA-EPBX 10000 UNIT/ML IJ SOLN
INTRAMUSCULAR | Status: AC
  Administered 2023-03-05: 15000 [IU] via SUBCUTANEOUS
  Filled 2023-03-05: qty 2

## 2023-03-05 MED ORDER — EPOETIN ALFA-EPBX 10000 UNIT/ML IJ SOLN: 15000.0000 [IU] | Freq: Once | INTRAMUSCULAR | Status: AC

## 2023-03-12 ENCOUNTER — Ambulatory Visit (HOSPITAL_COMMUNITY): Payer: 59

## 2023-03-12 ENCOUNTER — Encounter: Payer: 59 | Admitting: Vascular Surgery

## 2023-03-19 ENCOUNTER — Encounter (HOSPITAL_COMMUNITY)
Admission: RE | Admit: 2023-03-19 | Discharge: 2023-03-19 | Disposition: A | Discharge status: Home / Self Care | Payer: 59 | Source: Ambulatory Visit | Attending: Internal Medicine | Admitting: Internal Medicine

## 2023-03-19 DIAGNOSIS — D631 Anemia in chronic kidney disease: Secondary | ICD-10-CM | POA: Diagnosis present

## 2023-03-19 DIAGNOSIS — N189 Chronic kidney disease, unspecified: Secondary | ICD-10-CM | POA: Diagnosis present

## 2023-03-19 LAB — POCT HEMOGLOBIN-HEMACUE: Hemoglobin: 9.6 g/dL — ABNORMAL LOW (ref 12.0–15.0)

## 2023-03-19 MED ORDER — EPOETIN ALFA-EPBX 10000 UNIT/ML IJ SOLN: 15000.0000 [IU] | INTRAMUSCULAR | Status: DC

## 2023-03-19 MED ORDER — EPOETIN ALFA-EPBX 10000 UNIT/ML IJ SOLN
INTRAMUSCULAR | Status: AC
  Administered 2023-03-19: 15000 [IU] via SUBCUTANEOUS
  Filled 2023-03-19: qty 2

## 2023-04-02 ENCOUNTER — Encounter: Payer: Self-pay | Admitting: Internal Medicine

## 2023-04-02 ENCOUNTER — Encounter (HOSPITAL_COMMUNITY)
Admission: RE | Admit: 2023-04-02 | Discharge: 2023-04-02 | Disposition: A | Discharge status: Home / Self Care | Payer: 59 | Source: Ambulatory Visit | Attending: Internal Medicine | Admitting: Internal Medicine

## 2023-04-02 DIAGNOSIS — N189 Chronic kidney disease, unspecified: Secondary | ICD-10-CM | POA: Diagnosis not present

## 2023-04-02 DIAGNOSIS — D509 Iron deficiency anemia, unspecified: Secondary | ICD-10-CM | POA: Insufficient documentation

## 2023-04-02 DIAGNOSIS — D631 Anemia in chronic kidney disease: Secondary | ICD-10-CM | POA: Insufficient documentation

## 2023-04-02 LAB — IRON AND TIBC
Iron: 76 ug/dL (ref 28–170)
Saturation Ratios: 32 % — ABNORMAL HIGH (ref 10.4–31.8)
TIBC: 237 ug/dL — ABNORMAL LOW (ref 250–450)
UIBC: 161 ug/dL

## 2023-04-02 LAB — FERRITIN: Ferritin: 215 ng/mL (ref 11–307)

## 2023-04-02 LAB — POCT HEMOGLOBIN-HEMACUE: Hemoglobin: 10.3 g/dL — ABNORMAL LOW (ref 12.0–15.0)

## 2023-04-02 MED ORDER — EPOETIN ALFA-EPBX 10000 UNIT/ML IJ SOLN
15000.0000 [IU] | INTRAMUSCULAR | Status: DC
  Administered 2023-04-02: 15000 [IU] via SUBCUTANEOUS

## 2023-04-02 MED ORDER — EPOETIN ALFA-EPBX 10000 UNIT/ML IJ SOLN
INTRAMUSCULAR | Status: AC
  Filled 2023-04-02: qty 2

## 2023-04-14 IMAGING — US US RENAL
1 series · 15 of 25 positions shown · non-contrast
Comparison: None.

CLINICAL DATA: Acute renal insufficiency

EXAM:
RENAL / URINARY TRACT ULTRASOUND COMPLETE

[Series 1: us renal mc & wl · 15 of 39 slices shown]
[im 1/39]
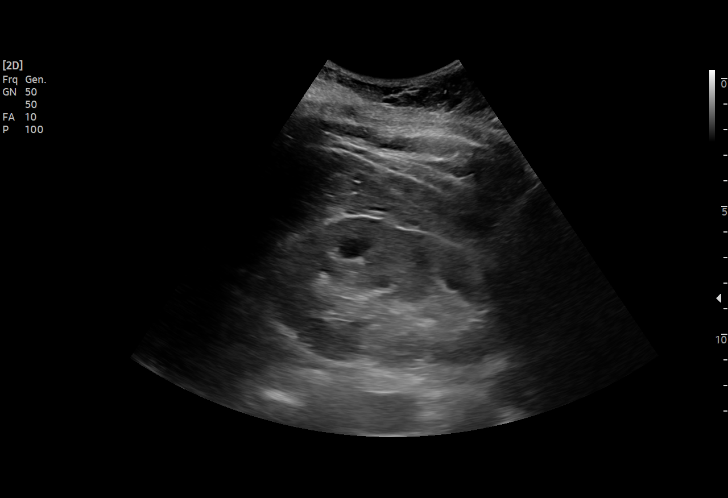
[im 4/39]
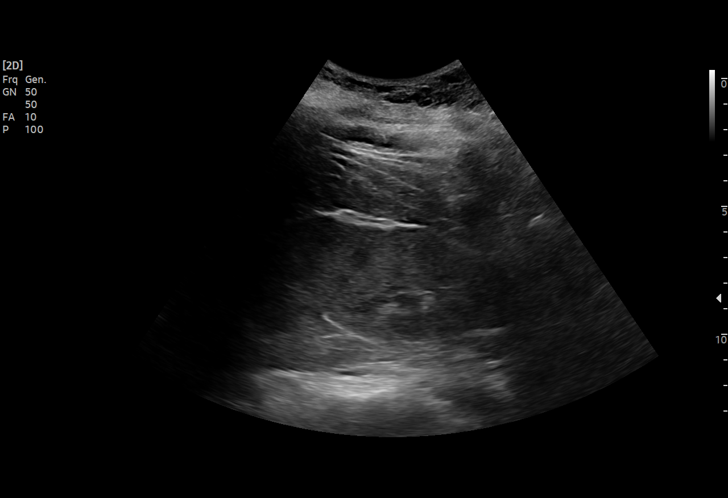
[im 7/39]
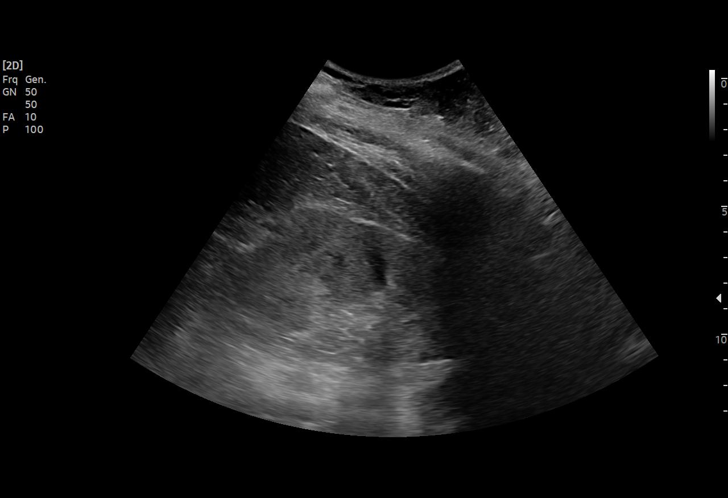
[im 8/39]
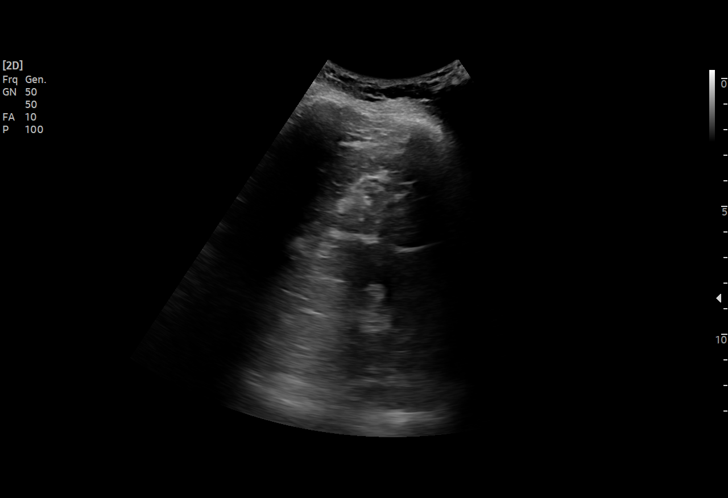
[im 12/39]
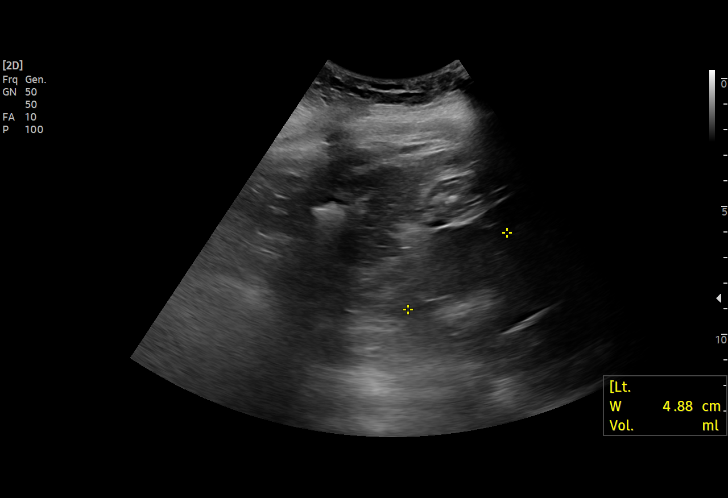
[im 15/39]
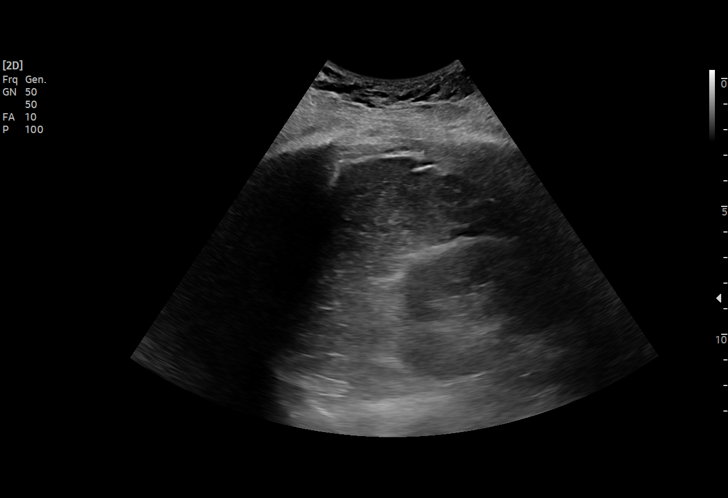
[im 16/39]
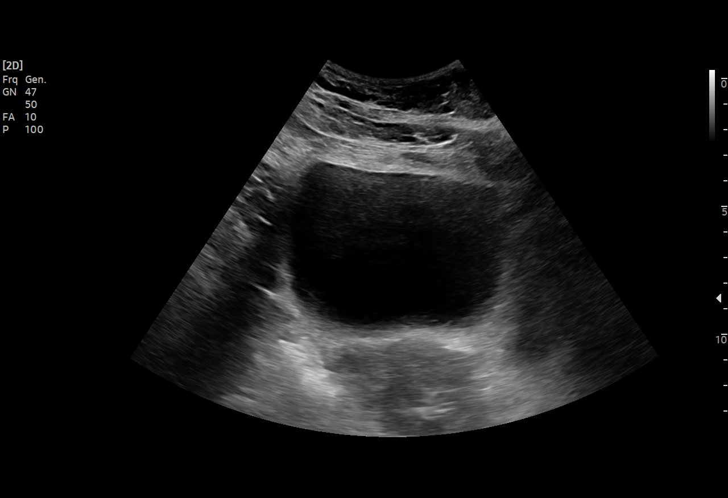
[im 20/39]
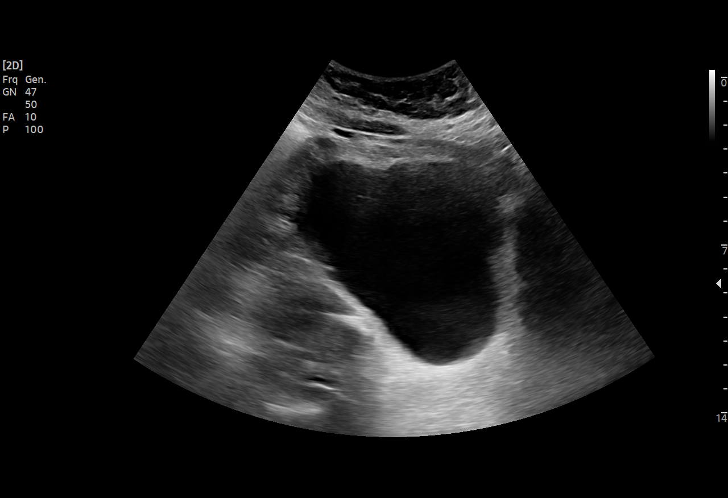
[im 23/39]
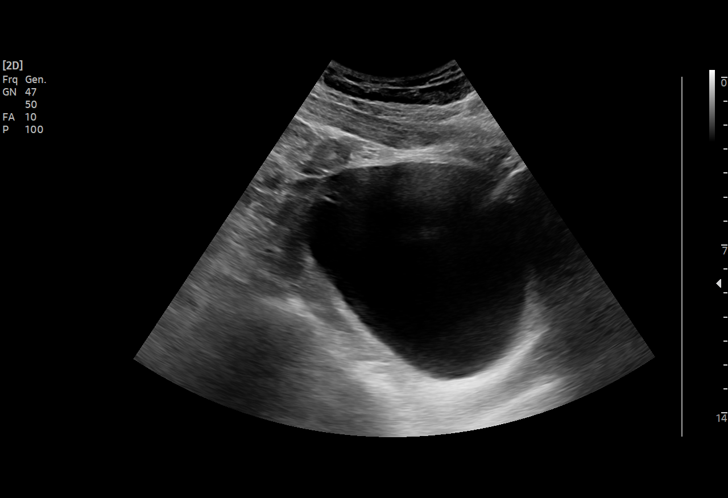
[im 24/39]
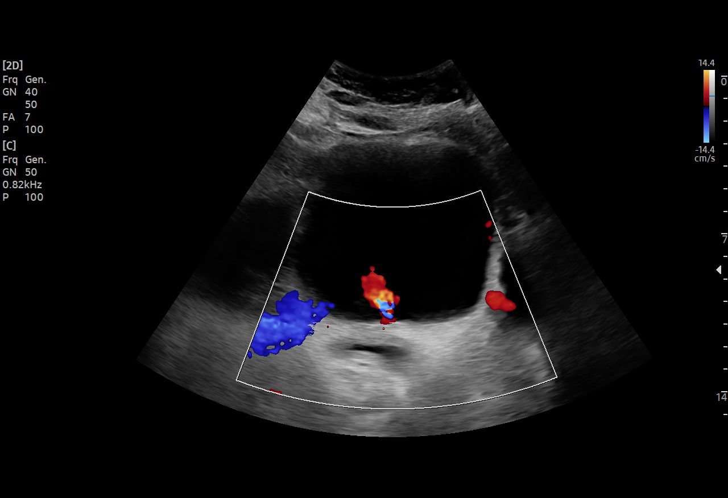
[im 27/39]
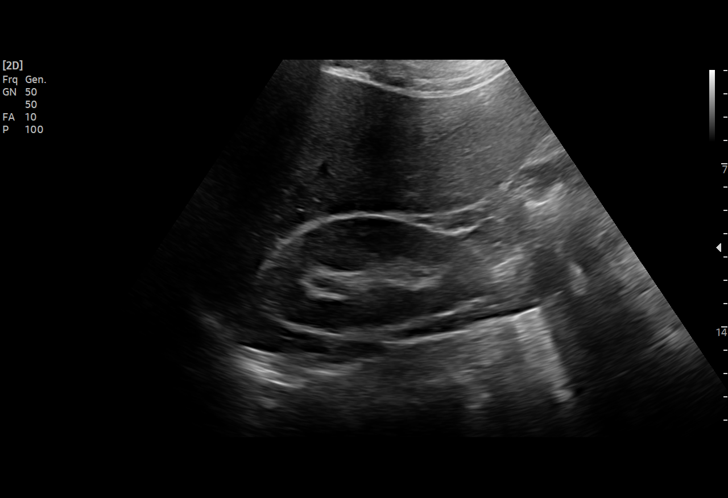
[im 31/39]
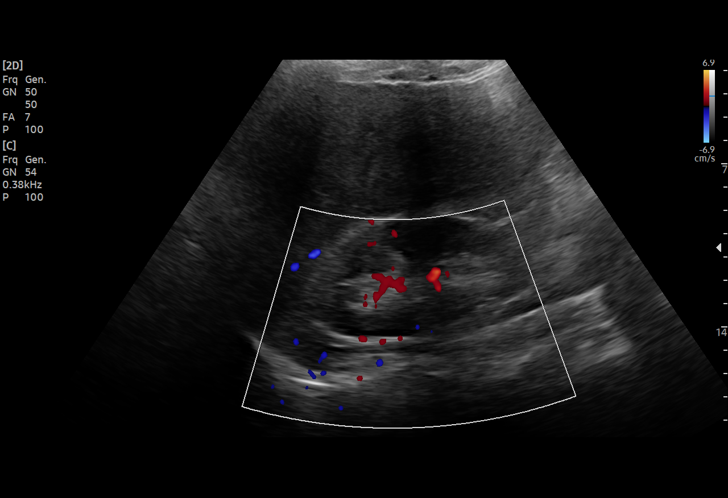
[im 32/39]
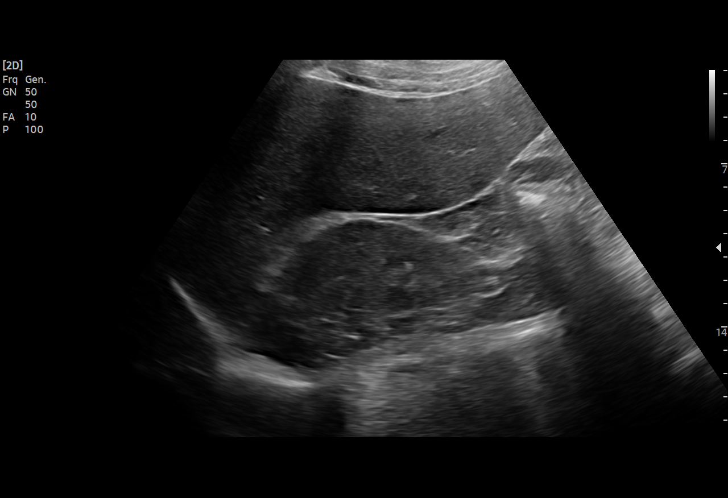
[im 35/39]
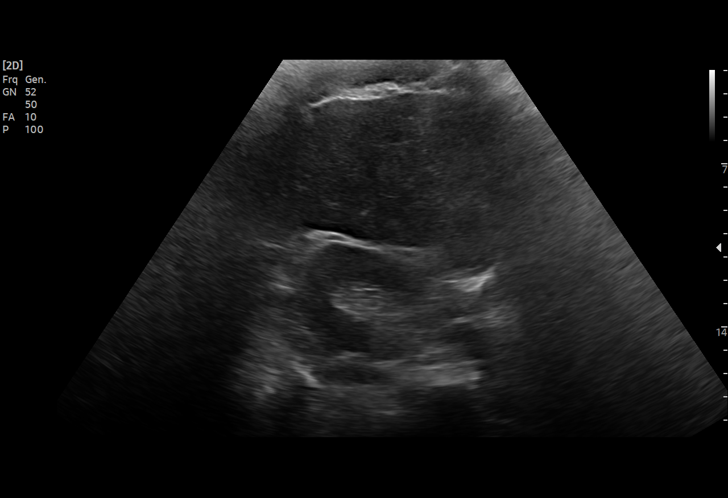
[im 39/39]
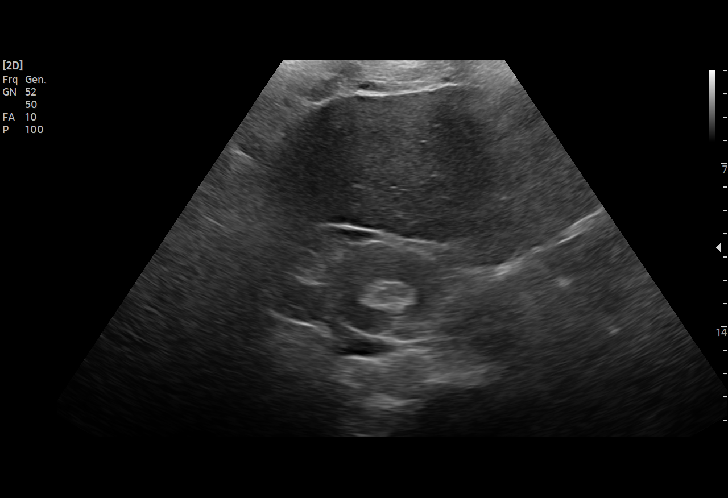

[15 of 25 positions shown; findings below may reference images not displayed]

FINDINGS: Right Kidney:

Renal measurements: 10.0 x 4.6 x 4.2 cm = volume: 101 mL. Slightly
limited evaluation of the lower pole the right kidney due to
overlying bowel gas. Echogenicity within normal limits. No mass or
hydronephrosis visualized.

Left Kidney:

Renal measurements: 10.9 x 5.7 x 4.9 cm = volume: 159 mL.
Echogenicity within normal limits. No mass or hydronephrosis
visualized.

Bladder:

Appears normal for degree of bladder distention. Bilateral ureteral
jets are identified.

Other:

None.
IMPRESSION: Normal renal sonogram

## 2023-04-16 ENCOUNTER — Encounter (HOSPITAL_COMMUNITY)
Admission: RE | Admit: 2023-04-16 | Discharge: 2023-04-16 | Disposition: A | Discharge status: Home / Self Care | Payer: 59 | Source: Ambulatory Visit | Attending: Internal Medicine | Admitting: Internal Medicine

## 2023-04-16 VITALS — BP 158/71 | HR 78 | Temp 97.4°F | Resp 18

## 2023-04-16 DIAGNOSIS — N189 Chronic kidney disease, unspecified: Secondary | ICD-10-CM | POA: Insufficient documentation

## 2023-04-16 DIAGNOSIS — D631 Anemia in chronic kidney disease: Secondary | ICD-10-CM | POA: Insufficient documentation

## 2023-04-16 LAB — POCT HEMOGLOBIN-HEMACUE: Hemoglobin: 10.1 g/dL — ABNORMAL LOW (ref 12.0–15.0)

## 2023-04-16 MED ORDER — EPOETIN ALFA-EPBX 10000 UNIT/ML IJ SOLN: 15000.0000 [IU] | INTRAMUSCULAR | Status: DC

## 2023-04-16 MED ORDER — EPOETIN ALFA-EPBX 10000 UNIT/ML IJ SOLN
INTRAMUSCULAR | Status: AC
  Administered 2023-04-16: 15000 [IU] via SUBCUTANEOUS
  Filled 2023-04-16: qty 2

## 2023-04-20 ENCOUNTER — Ambulatory Visit (HOSPITAL_COMMUNITY)
Admission: RE | Admit: 2023-04-20 | Discharge: 2023-04-20 | Disposition: A | Discharge status: Home / Self Care | Payer: 59 | Source: Ambulatory Visit | Attending: Vascular Surgery | Admitting: Vascular Surgery

## 2023-04-20 ENCOUNTER — Ambulatory Visit (INDEPENDENT_AMBULATORY_CARE_PROVIDER_SITE_OTHER)
Admission: RE | Admit: 2023-04-20 | Discharge: 2023-04-20 | Disposition: A | Discharge status: Home / Self Care | Payer: 59 | Source: Ambulatory Visit | Attending: Vascular Surgery | Admitting: Vascular Surgery

## 2023-04-20 DIAGNOSIS — N186 End stage renal disease: Secondary | ICD-10-CM | POA: Insufficient documentation

## 2023-04-20 NOTE — Progress Notes (Unsigned)
[facility] [place]  ASSESSMENT / PLAN: Erin Davila is a 62 y.o. right handed female in need of permanent dialysis access. I reviewed options for dialysis in detail with the patient, including hemodialysis and peritoneal dialysis. I counseled the patient to ask their nephrologist about their candidacy for renal transplant. I counseled the patient that dialysis access requires surveillance and periodic maintenance. Plan to proceed with left brachiocephalic arteriovenous fistula creation.  Patient counseled she may need a second surgery for superficialization.  CHIEF COMPLAINT: Worsening kidney function  HISTORY OF PRESENT ILLNESS: Erin Davila is a 62 y.o. female referred to clinic for evaluation of worsening kidney function.  The patient has never had a pacemaker report before.  She is right-handed.  She has never had dialysis access before.  We reviewed her vein mapping in detail.   Past Medical History:  Diagnosis Date   Allergy    Anemia of chronic kidney failure    Diabetes mellitus without complication (HCC)    History of iron deficiency anemia    Hyperlipidemia    Hypertension     Past Surgical History:  Procedure Laterality Date   CESAREAN SECTION     INCISION AND DRAINAGE ABSCESS N/A 01/04/2021   Procedure: INCISION AND DRAINAGE LEFT BUTTOCK ABSCESS;  Surgeon: Luretha Murphy, MD;  Location: [facility];  Service: General;  Laterality: N/A;    History reviewed. No pertinent family history.  Social History   Socioeconomic History   Marital status: Single    Spouse name: Not on file   Number of children: Not on file   Years of education: Not on file   Highest education level: Not on file  Occupational History   Not on file  Tobacco Use   Smoking status: Never   Smokeless tobacco: Never  Vaping Use   Vaping status: Never Used  Substance and Sexual Activity   Alcohol use: No   Drug use: No   Sexual activity: Not on file  Other Topics  Concern   Not on file  Social History Narrative   Not on file   Social Determinants of Health   Financial Resource Strain: Not on file  Food Insecurity: Not on file  Transportation Needs: Not on file  Physical Activity: Not on file  Stress: Not on file  Social Connections: Not on file  Intimate Partner Violence: Not on file    Allergies  Allergen Reactions   Lactose Other (See Comments)    GI complaints    Current Outpatient Medications  Medication Sig Dispense Refill   acetaminophen (TYLENOL) 325 MG tablet Take 2 tablets (650 mg total) by mouth every 6 (six) hours as needed for mild pain (or temp > 100).     amLODipine (NORVASC) 2.5 MG tablet Take 1 tablet (2.5 mg total) by mouth daily. 30 tablet 0   diphenhydrAMINE (BENADRYL) 25 mg capsule Take 1 capsule (25 mg total) by mouth every 6 (six) hours as needed for itching. 30 capsule 0   hyoscyamine (LEVSIN) 0.125 MG/5ML ELIX Take 10 mLs (0.25 mg total) by mouth every 6 (six) hours as needed for cramping (abdominal pain/discomfort). 600 mL 0   insulin glargine (LANTUS SOLOSTAR) 100 UNIT/ML Solostar Pen Inject 44 Units into the skin daily. 15 mL 11   methocarbamol (ROBAXIN) 500 MG tablet Take 1 tablet (500 mg total) by mouth every 6 (six) hours as needed for muscle spasms. 60 tablet 0   montelukast (SINGULAIR) 10 MG tablet Take 1 tablet (  10 mg total) by mouth at bedtime. 30 tablet 0   Multiple Vitamin (MULTIVITAMIN WITH MINERALS) TABS tablet Take 1 tablet by mouth daily.     Multiple Vitamin (MULTIVITAMIN) tablet Take 1 tablet by mouth daily.     olmesartan (BENICAR) 40 MG tablet Take 40 mg by mouth daily.     oxyCODONE (OXY IR/ROXICODONE) 5 MG immediate release tablet Take 1-2 tablets (5-10 mg total) by mouth every 4 (four) hours as needed for moderate pain. 30 tablet 0   pantoprazole (PROTONIX) 40 MG tablet Take 1 tablet (40 mg total) by mouth daily. 30 tablet 0   simvastatin (ZOCOR) 40 MG tablet Take 1 tablet (40 mg total) by  mouth at bedtime. 30 tablet 0   Vitamin D, Ergocalciferol, (DRISDOL) 1.25 MG (50000 UNIT) CAPS capsule Take 1 capsule (50,000 Units total) by mouth every 7 (seven) days. 5 capsule 0   No current facility-administered medications for this visit.    PHYSICAL EXAM Vitals:   04/21/23 1102  BP: (!) 178/85  Pulse: 78  Temp: 98 F (36.7 C)  SpO2: 96%  Weight: (!) 412 lb (186.9 kg)  Height: 5\' 8"  (1.727 m)    Obese elderly woman in no distress Regular rate and rhythm Unlabored breathing 2+ radial pulses bilaterally   PERTINENT LABORATORY AND RADIOLOGIC DATA  Most recent CBC    Latest Ref Rng & Units 04/16/2023    9:22 AM 04/02/2023    9:06 AM 03/19/2023    9:26 AM  CBC  Hemoglobin 12.0 - 15.0 g/dL 14.7  82.9  9.6      Most recent CMP    Latest Ref Rng & Units 01/19/2021   11:44 AM 01/18/2021    6:43 AM 01/17/2021    6:02 AM  CMP  Glucose 70 - 99 mg/dL 562  92  130   BUN 6 - 20 mg/dL 27  30  30    Creatinine 0.44 - 1.00 mg/dL 8.65  7.84  6.96   Sodium 135 - 145 mmol/L 138  135  135   Potassium 3.5 - 5.1 mmol/L 4.7  4.4  4.4   Chloride 98 - 111 mmol/L 107  103  105   CO2 22 - 32 mmol/L 23  24  22    Calcium 8.9 - 10.3 mg/dL 8.9  8.9  8.4     Renal function CrCl cannot be calculated (Patient's most recent lab result is older than the maximum 21 days allowed.).  Hgb A1c MFr Bld (%)  Date Value  01/03/2021 12.8 (H)   Vein mapping shows adequate left arm cephalic vein for fistula  Rande Brunt. Lenell Antu, MD FACS [facility] Phone Number: [phone] 04/21/2023 11:18 AM   Total time spent on preparing this encounter including chart review, data review, collecting history, examining the patient, coordinating care for this new patient, 60 minutes.  Portions of this report may have been transcribed using voice recognition software.  Every effort has been made to ensure accuracy; however, inadvertent computerized transcription errors may  still be present.

## 2023-04-20 NOTE — H&P (View-Only) (Signed)
[facility] [place]  ASSESSMENT / PLAN: Erin Davila is a 62 y.o. right handed female in need of permanent dialysis access. I reviewed options for dialysis in detail with the patient, including hemodialysis and peritoneal dialysis. I counseled the patient to ask their nephrologist about their candidacy for renal transplant. I counseled the patient that dialysis access requires surveillance and periodic maintenance. Plan to proceed with left brachiocephalic arteriovenous fistula creation.  Patient counseled she may need a second surgery for superficialization.  CHIEF COMPLAINT: Worsening kidney function  HISTORY OF PRESENT ILLNESS: Erin Davila is a 62 y.o. female referred to clinic for evaluation of worsening kidney function.  The patient has never had a pacemaker report before.  She is right-handed.  She has never had dialysis access before.  We reviewed her vein mapping in detail.   Past Medical History:  Diagnosis Date   Allergy    Anemia of chronic kidney failure    Diabetes mellitus without complication (HCC)    History of iron deficiency anemia    Hyperlipidemia    Hypertension     Past Surgical History:  Procedure Laterality Date   CESAREAN SECTION     INCISION AND DRAINAGE ABSCESS N/A 01/04/2021   Procedure: INCISION AND DRAINAGE LEFT BUTTOCK ABSCESS;  Surgeon: Luretha Murphy, MD;  Location: [facility];  Service: General;  Laterality: N/A;    History reviewed. No pertinent family history.  Social History   Socioeconomic History   Marital status: Single    Spouse name: Not on file   Number of children: Not on file   Years of education: Not on file   Highest education level: Not on file  Occupational History   Not on file  Tobacco Use   Smoking status: Never   Smokeless tobacco: Never  Vaping Use   Vaping status: Never Used  Substance and Sexual Activity   Alcohol use: No   Drug use: No   Sexual activity: Not on file  Other Topics  Concern   Not on file  Social History Narrative   Not on file   Social Determinants of Health   Financial Resource Strain: Not on file  Food Insecurity: Not on file  Transportation Needs: Not on file  Physical Activity: Not on file  Stress: Not on file  Social Connections: Not on file  Intimate Partner Violence: Not on file    Allergies  Allergen Reactions   Lactose Other (See Comments)    GI complaints    Current Outpatient Medications  Medication Sig Dispense Refill   acetaminophen (TYLENOL) 325 MG tablet Take 2 tablets (650 mg total) by mouth every 6 (six) hours as needed for mild pain (or temp > 100).     amLODipine (NORVASC) 2.5 MG tablet Take 1 tablet (2.5 mg total) by mouth daily. 30 tablet 0   diphenhydrAMINE (BENADRYL) 25 mg capsule Take 1 capsule (25 mg total) by mouth every 6 (six) hours as needed for itching. 30 capsule 0   hyoscyamine (LEVSIN) 0.125 MG/5ML ELIX Take 10 mLs (0.25 mg total) by mouth every 6 (six) hours as needed for cramping (abdominal pain/discomfort). 600 mL 0   insulin glargine (LANTUS SOLOSTAR) 100 UNIT/ML Solostar Pen Inject 44 Units into the skin daily. 15 mL 11   methocarbamol (ROBAXIN) 500 MG tablet Take 1 tablet (500 mg total) by mouth every 6 (six) hours as needed for muscle spasms. 60 tablet 0   montelukast (SINGULAIR) 10 MG tablet Take 1 tablet (  10 mg total) by mouth at bedtime. 30 tablet 0   Multiple Vitamin (MULTIVITAMIN WITH MINERALS) TABS tablet Take 1 tablet by mouth daily.     Multiple Vitamin (MULTIVITAMIN) tablet Take 1 tablet by mouth daily.     olmesartan (BENICAR) 40 MG tablet Take 40 mg by mouth daily.     oxyCODONE (OXY IR/ROXICODONE) 5 MG immediate release tablet Take 1-2 tablets (5-10 mg total) by mouth every 4 (four) hours as needed for moderate pain. 30 tablet 0   pantoprazole (PROTONIX) 40 MG tablet Take 1 tablet (40 mg total) by mouth daily. 30 tablet 0   simvastatin (ZOCOR) 40 MG tablet Take 1 tablet (40 mg total) by  mouth at bedtime. 30 tablet 0   Vitamin D, Ergocalciferol, (DRISDOL) 1.25 MG (50000 UNIT) CAPS capsule Take 1 capsule (50,000 Units total) by mouth every 7 (seven) days. 5 capsule 0   No current facility-administered medications for this visit.    PHYSICAL EXAM Vitals:   04/21/23 1102  BP: (!) 178/85  Pulse: 78  Temp: 98 F (36.7 C)  SpO2: 96%  Weight: (!) 412 lb (186.9 kg)  Height: 5\' 8"  (1.727 m)    Obese elderly woman in no distress Regular rate and rhythm Unlabored breathing 2+ radial pulses bilaterally   PERTINENT LABORATORY AND RADIOLOGIC DATA  Most recent CBC    Latest Ref Rng & Units 04/16/2023    9:22 AM 04/02/2023    9:06 AM 03/19/2023    9:26 AM  CBC  Hemoglobin 12.0 - 15.0 g/dL 16.1  09.6  9.6      Most recent CMP    Latest Ref Rng & Units 01/19/2021   11:44 AM 01/18/2021    6:43 AM 01/17/2021    6:02 AM  CMP  Glucose 70 - 99 mg/dL 045  92  409   BUN 6 - 20 mg/dL 27  30  30    Creatinine 0.44 - 1.00 mg/dL 8.11  9.14  7.82   Sodium 135 - 145 mmol/L 138  135  135   Potassium 3.5 - 5.1 mmol/L 4.7  4.4  4.4   Chloride 98 - 111 mmol/L 107  103  105   CO2 22 - 32 mmol/L 23  24  22    Calcium 8.9 - 10.3 mg/dL 8.9  8.9  8.4     Renal function CrCl cannot be calculated (Patient's most recent lab result is older than the maximum 21 days allowed.).  Hgb A1c MFr Bld (%)  Date Value  01/03/2021 12.8 (H)   Vein mapping shows adequate left arm cephalic vein for fistula  Rande Brunt. Lenell Antu, MD FACS [facility] Phone Number: [phone] 04/21/2023 11:18 AM   Total time spent on preparing this encounter including chart review, data review, collecting history, examining the patient, coordinating care for this new patient, 60 minutes.  Portions of this report may have been transcribed using voice recognition software.  Every effort has been made to ensure accuracy; however, inadvertent computerized transcription errors may  still be present.

## 2023-04-21 ENCOUNTER — Ambulatory Visit: Payer: 59 | Admitting: Vascular Surgery

## 2023-04-21 ENCOUNTER — Encounter: Payer: Self-pay | Admitting: Vascular Surgery

## 2023-04-21 ENCOUNTER — Telehealth: Payer: Self-pay

## 2023-04-21 VITALS — BP 178/85 | HR 78 | Temp 98.0°F | Ht 68.0 in | Wt >= 6400 oz

## 2023-04-21 DIAGNOSIS — N185 Chronic kidney disease, stage 5: Secondary | ICD-10-CM | POA: Diagnosis not present

## 2023-04-21 NOTE — Telephone Encounter (Signed)
Called pt to schedule her AVF. Left VM asking that she return our call.

## 2023-04-24 ENCOUNTER — Other Ambulatory Visit: Payer: Self-pay

## 2023-04-24 DIAGNOSIS — N185 Chronic kidney disease, stage 5: Secondary | ICD-10-CM

## 2023-04-24 NOTE — Telephone Encounter (Signed)
Attempted to reach patient at both contact numbers, no answer. Able to LVM on cell number, to return call.

## 2023-04-24 NOTE — Telephone Encounter (Signed)
Patient returned call. Scheduled surgery for 10/30. Instructions provided, including the last dose of Ozempic on 10/21. She voiced understanding.

## 2023-04-30 ENCOUNTER — Encounter (HOSPITAL_COMMUNITY)
Admission: RE | Admit: 2023-04-30 | Discharge: 2023-04-30 | Disposition: A | Discharge status: Home / Self Care | Payer: 59 | Source: Ambulatory Visit | Attending: Internal Medicine | Admitting: Internal Medicine

## 2023-04-30 VITALS — BP 146/63 | HR 78 | Temp 97.9°F | Resp 18

## 2023-04-30 DIAGNOSIS — D631 Anemia in chronic kidney disease: Secondary | ICD-10-CM

## 2023-04-30 DIAGNOSIS — N189 Chronic kidney disease, unspecified: Secondary | ICD-10-CM | POA: Diagnosis not present

## 2023-04-30 LAB — IRON AND TIBC
Iron: 53 ug/dL (ref 28–170)
Saturation Ratios: 23 % (ref 10.4–31.8)
TIBC: 231 ug/dL — ABNORMAL LOW (ref 250–450)
UIBC: 178 ug/dL

## 2023-04-30 LAB — FERRITIN: Ferritin: 196 ng/mL (ref 11–307)

## 2023-04-30 LAB — POCT HEMOGLOBIN-HEMACUE: Hemoglobin: 10.6 g/dL — ABNORMAL LOW (ref 12.0–15.0)

## 2023-04-30 MED ORDER — EPOETIN ALFA-EPBX 10000 UNIT/ML IJ SOLN
INTRAMUSCULAR | Status: AC
  Filled 2023-04-30: qty 2

## 2023-04-30 MED ORDER — EPOETIN ALFA-EPBX 10000 UNIT/ML IJ SOLN
15000.0000 [IU] | INTRAMUSCULAR | Status: DC
Start: 2023-04-30 — End: 2023-05-01
  Administered 2023-04-30: 15000 [IU] via SUBCUTANEOUS

## 2023-05-04 ENCOUNTER — Encounter (HOSPITAL_COMMUNITY): Payer: Self-pay | Admitting: Vascular Surgery

## 2023-05-04 ENCOUNTER — Other Ambulatory Visit: Payer: Self-pay

## 2023-05-04 NOTE — Progress Notes (Signed)
PCP - Dr Haskel Schroeder Cardiologist - none Neurology - Dr Louie Bun  Chest x-ray - n/a EKG - DOS Stress Test - n/a ECHO - n/a Cardiac Cath - n/a  ICD Pacemaker/Loop - n/a  Sleep Study -  n/a  Diabetes Type 2     THE Night prior to surgery, take 19 Units Lantus Insulin.    If your blood sugar is less than 70 mg/dL, you will need to treat for low blood sugar: Treat a low blood sugar (less than 70 mg/dL) with  cup of clear juice (cranberry or apple), 4 glucose tablets, OR glucose gel. Recheck blood sugar in 15 minutes after treatment (to make sure it is greater than 70 mg/dL). If your blood sugar is not greater than 70 mg/dL on recheck, call [phone] for further instructions.  Last dose of Ozempic was on 04/27/23 per patient.    NPO  Anesthesia review: Yes  STOP now taking any Aspirin (unless otherwise instructed by your surgeon), Aleve, Naproxen, Ibuprofen, Motrin, Advil, Goody's, BC's, all herbal medications, fish oil, and all vitamins.   Coronavirus Screening Do you have any of the following symptoms:  Cough yes/no: No Fever (>100.60F)  yes/no: No Runny nose yes/no: No Sore throat yes/no: No Difficulty breathing/shortness of breath  yes/no: No  Have you traveled in the last 14 days and where? yes/no: No  Patient verbalized understanding of instructions that were given via phone.

## 2023-05-05 NOTE — Anesthesia Preprocedure Evaluation (Signed)
Anesthesia Evaluation  Patient identified by MRN, date of birth, ID band Patient awake    Reviewed: Allergy & Precautions, NPO status , Patient's Chart, lab work & pertinent test results  Airway Mallampati: III  TM Distance: >3 FB Neck ROM: Full    Dental no notable dental hx. (+) Teeth Intact, Dental Advisory Given   Pulmonary    Pulmonary exam normal breath sounds clear to auscultation       Cardiovascular hypertension, Pt. on medications and Pt. on home beta blockers Normal cardiovascular exam Rhythm:Regular Rate:Normal     Neuro/Psych negative neurological ROS  negative psych ROS   GI/Hepatic ,GERD  ,,  Endo/Other  diabetes, Type 2, Insulin Dependent  Morbid obesityPt on GLP-1  Renal/GU CRFRenal diseaseLab Results      Component                Value               Date                      NA                       144                 05/06/2023                CL                       114 (H)             05/06/2023                K                        4.4                 05/06/2023               BUN                      40 (H)              05/06/2023                CREATININE               5.90 (H)            05/06/2023                   GLUCOSE                  97                  05/06/2023                Musculoskeletal   Abdominal   Peds  Hematology  (+) Blood dyscrasia, anemia Lab Results      Component                Value               Date                              HGB  12.6                05/06/2023                HCT                      37.0                05/06/2023                  Anesthesia Other Findings All: Lactose  Reproductive/Obstetrics                              Anesthesia Physical Anesthesia Plan  ASA: 3  Anesthesia Plan: Regional   Post-op Pain Management: Regional block* and Minimal or no pain anticipated   Induction:    PONV Risk Score and Plan: Treatment may vary due to age or medical condition and Ondansetron  Airway Management Planned: Natural Airway and Simple Face Mask  Additional Equipment: None  Intra-op Plan:   Post-operative Plan:   Informed Consent: I have reviewed the patients History and Physical, chart, labs and discussed the procedure including the risks, benefits and alternatives for the proposed anesthesia with the patient or authorized representative who has indicated his/her understanding and acceptance.     Dental advisory given  Plan Discussed with: CRNA  Anesthesia Plan Comments: (L supraclavicular block)         Anesthesia Quick Evaluation

## 2023-05-06 ENCOUNTER — Other Ambulatory Visit (HOSPITAL_COMMUNITY): Payer: Self-pay

## 2023-05-06 ENCOUNTER — Ambulatory Visit (HOSPITAL_COMMUNITY)
Admission: RE | Admit: 2023-05-06 | Discharge: 2023-05-06 | Disposition: A | Discharge status: Home / Self Care | Payer: 59 | Attending: Vascular Surgery | Admitting: Vascular Surgery

## 2023-05-06 ENCOUNTER — Ambulatory Visit (HOSPITAL_COMMUNITY): Payer: Self-pay | Admitting: Physician Assistant

## 2023-05-06 ENCOUNTER — Other Ambulatory Visit: Payer: Self-pay

## 2023-05-06 ENCOUNTER — Encounter (HOSPITAL_COMMUNITY)
Admission: RE | Disposition: A | Discharge status: Home / Self Care | Payer: Self-pay | Source: Home / Self Care | Attending: Vascular Surgery

## 2023-05-06 ENCOUNTER — Encounter (HOSPITAL_COMMUNITY): Payer: Self-pay | Admitting: Vascular Surgery

## 2023-05-06 ENCOUNTER — Encounter (HOSPITAL_COMMUNITY): Payer: Self-pay

## 2023-05-06 DIAGNOSIS — E1122 Type 2 diabetes mellitus with diabetic chronic kidney disease: Secondary | ICD-10-CM | POA: Diagnosis not present

## 2023-05-06 DIAGNOSIS — Z7985 Long-term (current) use of injectable non-insulin antidiabetic drugs: Secondary | ICD-10-CM | POA: Insufficient documentation

## 2023-05-06 DIAGNOSIS — N185 Chronic kidney disease, stage 5: Secondary | ICD-10-CM | POA: Insufficient documentation

## 2023-05-06 DIAGNOSIS — Z6841 Body Mass Index (BMI) 40.0 and over, adult: Secondary | ICD-10-CM | POA: Insufficient documentation

## 2023-05-06 DIAGNOSIS — K219 Gastro-esophageal reflux disease without esophagitis: Secondary | ICD-10-CM | POA: Diagnosis not present

## 2023-05-06 DIAGNOSIS — I129 Hypertensive chronic kidney disease with stage 1 through stage 4 chronic kidney disease, or unspecified chronic kidney disease: Secondary | ICD-10-CM

## 2023-05-06 DIAGNOSIS — Z794 Long term (current) use of insulin: Secondary | ICD-10-CM | POA: Insufficient documentation

## 2023-05-06 DIAGNOSIS — I120 Hypertensive chronic kidney disease with stage 5 chronic kidney disease or end stage renal disease: Secondary | ICD-10-CM | POA: Diagnosis present

## 2023-05-06 HISTORY — PX: AV FISTULA PLACEMENT: SHX1204

## 2023-05-06 HISTORY — DX: Gastro-esophageal reflux disease without esophagitis: K21.9

## 2023-05-06 LAB — POCT I-STAT, CHEM 8
BUN: 40 mg/dL — ABNORMAL HIGH (ref 6–20)
Calcium, Ion: 1.16 mmol/L (ref 1.15–1.40)
Chloride: 114 mmol/L — ABNORMAL HIGH (ref 98–111)
Creatinine, Ser: 5.9 mg/dL — ABNORMAL HIGH (ref 0.44–1.00)
Glucose, Bld: 97 mg/dL (ref 70–99)
HCT: 37 % (ref 36.0–46.0)
Hemoglobin: 12.6 g/dL (ref 12.0–15.0)
Potassium: 4.4 mmol/L (ref 3.5–5.1)
Sodium: 144 mmol/L (ref 135–145)
TCO2: 20 mmol/L — ABNORMAL LOW (ref 22–32)

## 2023-05-06 LAB — GLUCOSE, CAPILLARY
Glucose-Capillary: 92 mg/dL (ref 70–99)
Glucose-Capillary: 96 mg/dL (ref 70–99)

## 2023-05-06 SURGERY — ARTERIOVENOUS (AV) FISTULA CREATION
Anesthesia: Regional | Laterality: Left

## 2023-05-06 MED ORDER — HEPARIN SODIUM (PORCINE) 1000 UNIT/ML IJ SOLN
INTRAMUSCULAR | Status: DC | PRN
  Administered 2023-05-06: 10000 [IU] via INTRAVENOUS

## 2023-05-06 MED ORDER — PAPAVERINE HCL 30 MG/ML IJ SOLN
INTRAMUSCULAR | Status: AC
Start: 2023-05-06 — End: ?
  Filled 2023-05-06: qty 2

## 2023-05-06 MED ORDER — MIDAZOLAM HCL 2 MG/2ML IJ SOLN
INTRAMUSCULAR | Status: DC | PRN
  Administered 2023-05-06: 2 mg via INTRAVENOUS

## 2023-05-06 MED ORDER — HEPARIN 6000 UNIT IRRIGATION SOLUTION
Status: DC | PRN
  Administered 2023-05-06: 1

## 2023-05-06 MED ORDER — HEPARIN SODIUM (PORCINE) 1000 UNIT/ML IJ SOLN
INTRAMUSCULAR | Status: AC
  Filled 2023-05-06: qty 20

## 2023-05-06 MED ORDER — DEXAMETHASONE SODIUM PHOSPHATE 10 MG/ML IJ SOLN
INTRAMUSCULAR | Status: DC | PRN
  Administered 2023-05-06: 10 mg via INTRAVENOUS

## 2023-05-06 MED ORDER — FENTANYL CITRATE (PF) 250 MCG/5ML IJ SOLN
INTRAMUSCULAR | Status: DC | PRN
  Administered 2023-05-06: 50 ug via INTRAVENOUS

## 2023-05-06 MED ORDER — HEPARIN 6000 UNIT IRRIGATION SOLUTION
Status: AC
  Filled 2023-05-06: qty 500

## 2023-05-06 MED ORDER — 0.9 % SODIUM CHLORIDE (POUR BTL) OPTIME
TOPICAL | Status: DC | PRN
  Administered 2023-05-06: 1000 mL

## 2023-05-06 MED ORDER — CHLORHEXIDINE GLUCONATE 4 % EX SOLN: 60.0000 mL | Freq: Once | CUTANEOUS | Status: DC

## 2023-05-06 MED ORDER — FENTANYL CITRATE (PF) 250 MCG/5ML IJ SOLN
INTRAMUSCULAR | Status: AC
  Filled 2023-05-06: qty 5

## 2023-05-06 MED ORDER — HYDROCODONE-ACETAMINOPHEN 5-325 MG PO TABS
1.0000 | ORAL_TABLET | Freq: Four times a day (QID) | ORAL | 0 refills | Status: DC | PRN
  Filled 2023-05-06: qty 10, 3d supply, fill #0

## 2023-05-06 MED ORDER — LIDOCAINE-EPINEPHRINE (PF) 1 %-1:200000 IJ SOLN
INTRAMUSCULAR | Status: AC
  Filled 2023-05-06: qty 30

## 2023-05-06 MED ORDER — CHLORHEXIDINE GLUCONATE 0.12 % MT SOLN
15.0000 mL | Freq: Once | OROMUCOSAL | Status: AC
Start: 2023-05-06 — End: 2023-05-06
  Administered 2023-05-06: 15 mL via OROMUCOSAL
  Filled 2023-05-06: qty 15

## 2023-05-06 MED ORDER — MIDAZOLAM HCL 2 MG/2ML IJ SOLN
INTRAMUSCULAR | Status: AC
  Filled 2023-05-06: qty 2

## 2023-05-06 MED ORDER — BUPIVACAINE HCL (PF) 0.5 % IJ SOLN
INTRAMUSCULAR | Status: DC | PRN
  Administered 2023-05-06: 15 mL via PERINEURAL

## 2023-05-06 MED ORDER — ACETAMINOPHEN 10 MG/ML IV SOLN: 1000.0000 mg | Freq: Once | INTRAVENOUS | Status: DC | PRN

## 2023-05-06 MED ORDER — FENTANYL CITRATE (PF) 100 MCG/2ML IJ SOLN
25.0000 ug | INTRAMUSCULAR | Status: DC | PRN
Start: 2023-05-06 — End: 2023-05-06

## 2023-05-06 MED ORDER — ONDANSETRON HCL 4 MG/2ML IJ SOLN
INTRAMUSCULAR | Status: DC | PRN
  Administered 2023-05-06: 4 mg via INTRAVENOUS

## 2023-05-06 MED ORDER — DEXAMETHASONE SODIUM PHOSPHATE 10 MG/ML IJ SOLN
INTRAMUSCULAR | Status: AC
  Filled 2023-05-06: qty 1

## 2023-05-06 MED ORDER — SODIUM CHLORIDE 0.9 % IV SOLN: INTRAVENOUS | Status: DC

## 2023-05-06 MED ORDER — PHENYLEPHRINE 80 MCG/ML (10ML) SYRINGE FOR IV PUSH (FOR BLOOD PRESSURE SUPPORT)
PREFILLED_SYRINGE | INTRAVENOUS | Status: DC | PRN
  Administered 2023-05-06 (×2): 80 ug via INTRAVENOUS
  Administered 2023-05-06: 160 ug via INTRAVENOUS
  Administered 2023-05-06: 240 ug via INTRAVENOUS
  Administered 2023-05-06: 80 ug via INTRAVENOUS

## 2023-05-06 MED ORDER — CEFAZOLIN IN SODIUM CHLORIDE 3-0.9 GM/100ML-% IV SOLN
3.0000 g | INTRAVENOUS | Status: AC
  Administered 2023-05-06: 3 g via INTRAVENOUS
  Filled 2023-05-06: qty 100

## 2023-05-06 MED ORDER — PROPOFOL 10 MG/ML IV BOLUS
INTRAVENOUS | Status: AC
  Filled 2023-05-06: qty 20

## 2023-05-06 MED ORDER — PROPOFOL 10 MG/ML IV BOLUS
INTRAVENOUS | Status: DC | PRN
  Administered 2023-05-06: 100 ug/kg/min via INTRAVENOUS
  Administered 2023-05-06: 40 mg via INTRAVENOUS

## 2023-05-06 MED ORDER — ORAL CARE MOUTH RINSE: 15.0000 mL | Freq: Once | OROMUCOSAL | Status: AC

## 2023-05-06 MED ORDER — PROPOFOL 1000 MG/100ML IV EMUL
INTRAVENOUS | Status: AC
  Filled 2023-05-06: qty 100

## 2023-05-06 MED ORDER — LIDOCAINE-EPINEPHRINE (PF) 1.5 %-1:200000 IJ SOLN
INTRAMUSCULAR | Status: DC | PRN
  Administered 2023-05-06: 15 mL via PERINEURAL

## 2023-05-06 MED ORDER — ONDANSETRON HCL 4 MG/2ML IJ SOLN
INTRAMUSCULAR | Status: AC
  Filled 2023-05-06: qty 2

## 2023-05-06 MED ORDER — ONDANSETRON HCL 4 MG/2ML IJ SOLN: 4.0000 mg | Freq: Once | INTRAMUSCULAR | Status: DC | PRN

## 2023-05-06 SURGICAL SUPPLY — 39 items
APL PRP STRL LF DISP 70% ISPRP (MISCELLANEOUS) ×1
APL SKNCLS STERI-STRIP NONHPOA (GAUZE/BANDAGES/DRESSINGS) ×1
ARMBAND PINK RESTRICT EXTREMIT (MISCELLANEOUS) ×1 IMPLANT
BENZOIN TINCTURE PRP APPL 2/3 (GAUZE/BANDAGES/DRESSINGS) ×1 IMPLANT
CANISTER SUCT 3000ML PPV (MISCELLANEOUS) ×1 IMPLANT
CANNULA VESSEL 3MM 2 BLNT TIP (CANNULA) ×1 IMPLANT
CHLORAPREP W/TINT 26 (MISCELLANEOUS) ×1 IMPLANT
CLIP LIGATING EXTRA MED SLVR (CLIP) ×1 IMPLANT
CLIP LIGATING EXTRA SM BLUE (MISCELLANEOUS) ×1 IMPLANT
COVER PROBE W GEL 5X96 (DRAPES) IMPLANT
DRSG IV TEGADERM 3.5X4.5 STRL (GAUZE/BANDAGES/DRESSINGS) IMPLANT
ELECT REM PT RETURN 9FT ADLT (ELECTROSURGICAL) ×1
ELECTRODE REM PT RTRN 9FT ADLT (ELECTROSURGICAL) ×1 IMPLANT
GAUZE SPONGE 4X4 12PLY STRL (GAUZE/BANDAGES/DRESSINGS) IMPLANT
GLOVE BIO SURGEON STRL SZ8 (GLOVE) ×1 IMPLANT
GOWN STRL REUS W/ TWL LRG LVL3 (GOWN DISPOSABLE) ×2 IMPLANT
GOWN STRL REUS W/ TWL XL LVL3 (GOWN DISPOSABLE) ×1 IMPLANT
GOWN STRL REUS W/TWL LRG LVL3 (GOWN DISPOSABLE) ×2
GOWN STRL REUS W/TWL XL LVL3 (GOWN DISPOSABLE) ×1
INSERT FOGARTY SM (MISCELLANEOUS) IMPLANT
KIT BASIN OR (CUSTOM PROCEDURE TRAY) ×1 IMPLANT
KIT TURNOVER KIT B (KITS) ×1 IMPLANT
NDL 18GX1X1/2 (RX/OR ONLY) (NEEDLE) IMPLANT
NEEDLE 18GX1X1/2 (RX/OR ONLY) (NEEDLE) IMPLANT
NS IRRIG 1000ML POUR BTL (IV SOLUTION) ×1 IMPLANT
PACK CV ACCESS (CUSTOM PROCEDURE TRAY) ×1 IMPLANT
PAD ARMBOARD 7.5X6 YLW CONV (MISCELLANEOUS) ×2 IMPLANT
SLING ARM FOAM STRAP LRG (SOFTGOODS) IMPLANT
SLING ARM FOAM STRAP MED (SOFTGOODS) IMPLANT
STRIP CLOSURE SKIN 1/2X4 (GAUZE/BANDAGES/DRESSINGS) ×1 IMPLANT
SUT MNCRL AB 4-0 PS2 18 (SUTURE) ×1 IMPLANT
SUT PROLENE 6 0 BV (SUTURE) ×1 IMPLANT
SUT VIC AB 3-0 SH 27 (SUTURE) ×1
SUT VIC AB 3-0 SH 27X BRD (SUTURE) ×1 IMPLANT
SYR 3ML LL SCALE MARK (SYRINGE) IMPLANT
TOWEL GREEN STERILE (TOWEL DISPOSABLE) ×1 IMPLANT
UNDERPAD 30X36 HEAVY ABSORB (UNDERPADS AND DIAPERS) ×1 IMPLANT
VASCULAR TIE MINI RED 18IN STL (MISCELLANEOUS) IMPLANT
WATER STERILE IRR 1000ML POUR (IV SOLUTION) ×1 IMPLANT

## 2023-05-06 NOTE — Transfer of Care (Signed)
Immediate Anesthesia Transfer of Care Note  Patient: Erin Davila  Procedure(s) Performed: LEFT ARM RADIOCEPHALIC ARTERIOVENOUS (AV) FISTULA CREATION (Left)  Patient Location: PACU  Anesthesia Type:MAC and Regional  Level of Consciousness: awake, alert , and oriented  Airway & Oxygen Therapy: Patient Spontanous Breathing and Patient connected to face mask oxygen  Post-op Assessment: Report given to RN and Post -op Vital signs reviewed and stable  Post vital signs: Reviewed and stable  Last Vitals:  Vitals Value Taken Time  BP 129/61 05/06/23 0925  Temp    Pulse 78 05/06/23 0927  Resp 23 05/06/23 0927  SpO2 95 % 05/06/23 0927  Vitals shown include unfiled device data.  Last Pain:  Vitals:   05/06/23 0625  TempSrc:   PainSc: 0-No pain         Complications: No notable events documented.

## 2023-05-06 NOTE — Discharge Instructions (Signed)

## 2023-05-06 NOTE — Progress Notes (Signed)
Orthopedic Tech Progress Note Patient Details:  Erin Davila [birth year] [id number]  Ortho Devices Type of Ortho Device: Arm sling Ortho Device/Splint Location: lue Ortho Device/Splint Interventions: Ordered, Application, Adjustment  Requested by pacu rn Post Interventions Patient Tolerated: Well Instructions Provided: Care of device, Adjustment of device  Trinna Post 05/06/2023, 10:16 AM

## 2023-05-06 NOTE — Anesthesia Procedure Notes (Signed)
Anesthesia Regional Block: Supraclavicular block   Pre-Anesthetic Checklist: , timeout performed,  Correct Patient, Correct Site, Correct Laterality,  Correct Procedure, Correct Position, site marked,  Risks and benefits discussed,  Surgical consent,  Pre-op evaluation,  At surgeon's request and post-op pain management  Laterality: Upper and Left  Prep: chloraprep       Needles:  Injection technique: Single-shot  Needle Type: Echogenic Needle     Needle Length: 5cm  Needle Gauge: 21     Additional Needles:   Procedures:,,,, ultrasound used (permanent image in chart),,     Nerve Stimulator or Paresthesia:   Additional Responses:  Block tested.  Patient tolerated procedure well Narrative:  Start time: 05/06/2023 7:55 AM End time: 05/06/2023 8:04 AM Injection made incrementally with aspirations every 5 mL.  Performed by: Personally  Anesthesiologist: Trevor Iha, MD  Additional Notes: Block tested. Patient tolerated procedure well.

## 2023-05-06 NOTE — Anesthesia Postprocedure Evaluation (Signed)
Anesthesia Post Note  Patient: RIVERS KERSCHER  Procedure(s) Performed: LEFT ARM RADIOCEPHALIC ARTERIOVENOUS (AV) FISTULA CREATION (Left)     Patient location during evaluation: PACU Anesthesia Type: Regional Level of consciousness: awake and alert Pain management: pain level controlled Vital Signs Assessment: post-procedure vital signs reviewed and stable Respiratory status: spontaneous breathing, nonlabored ventilation, respiratory function stable and patient connected to nasal cannula oxygen Cardiovascular status: stable and blood pressure returned to baseline Postop Assessment: no apparent nausea or vomiting Anesthetic complications: no   No notable events documented.  Last Vitals:  Vitals:   05/06/23 0945 05/06/23 1000  BP: (!) 150/62 (!) 154/73  Pulse: 75 75  Resp: 17 17  Temp:    SpO2: 94% 92%    Last Pain:  Vitals:   05/06/23 1000  TempSrc:   PainSc: 0-No pain                 Trevor Iha

## 2023-05-06 NOTE — Op Note (Signed)
DATE OF SERVICE: 05/06/2023  PATIENT:  Erin Davila  62 y.o. female  PRE-OPERATIVE DIAGNOSIS: Stage V chronic kidney disease  POST-OPERATIVE DIAGNOSIS:  Same  PROCEDURE:   Left brachiocephalic arteriovenous fistula creation  SURGEON:  Surgeons and Role:    * [name], MD - Primary  ASSISTANT: Aggie Moats, PA-C  An experienced assistant was required given the complexity of this procedure and the standard of surgical care. My assistant helped with exposure through counter tension, suctioning, ligation and retraction to better visualize the surgical field.  My assistant expedited sewing during the case by following my sutures. Wherever I use the term "we" in the report, my assistant actively helped me with that portion of the procedure.  ANESTHESIA:   general  EBL: Minimal  BLOOD ADMINISTERED:none  DRAINS: none   LOCAL MEDICATIONS USED:  NONE  SPECIMEN: None  COUNTS: confirmed correct .  TOURNIQUET:  none   PATIENT DISPOSITION:  PACU - hemodynamically stable.   Delay start of Pharmacological VTE agent (>24hrs) due to surgical blood loss or risk of bleeding: no  INDICATION FOR PROCEDURE: LOUVENA FESS is a 62 y.o. female with stage V chronic kidney disease in need of permanent dialysis access. After careful discussion of risks, benefits, and alternatives the patient was offered left arm arteriovenous fistula creation.  The patient understood and wished to proceed.  OPERATIVE FINDINGS: Healthy cephalic vein; healthy brachial vein.  Good technical result from fistula.  Good Doppler read at completion.  Good radial Doppler flow completion.  Likely to need superficialization in the future.  DESCRIPTION OF PROCEDURE: After identification of the patient in the pre-operative holding area, the patient was transferred to the operating room. The patient was positioned supine on the operating room table. Anesthesia was induced. The left arm was prepped and draped in  standard fashion. A surgical pause was performed confirming correct patient, procedure, and operative location.  Using intraoperative ultrasound, the course of the left upper extremity superficial veins was mapped.  The cephalic vein appeared adequate for arteriovenous fistula creation.  The brachial artery was similarly mapped.  The artery appeared adequate for arterial venous creation. We ensured there was no anomalous arterial anatomy such as a high bifurcation.  A transverse incision was made in the left arm just distal to the antecubital crease.  Incision was carried down through subcutaneous tissue until the cephalic vein was identified and skeletonized.  We continued our exposure through the aponeurosis of the biceps.  The brachial artery was encountered its usual position.  The artery was circumferentially exposed and encircled with Silastic Vesseloops.  Patient was systemically heparinized.  The distal cephalic vein was transected.  The distal stump of the cephalic vein was oversewn with a 2-0 silk suture.  The proximal vein was controlled with a bulldog clamp.  The brachial artery was clamped proximally medially.  An anterior arteriotomy was made with a 11 blade.  The arteriotomy was extended with Potts scissors.  Using a parachute technique the cephalic vein was anastomosed to the brachial arteriotomy in end-to-side fashion with continuous running suture of 6-0 Prolene.  Immediately prior to completion the anastomosis was flushed and de-aired.  The anastomosis was completed.  Hemostasis was assured.  The fistula was interrogated with Doppler.  Audible bruit was heard throughout the course of the cephalic vein.  A radial artery signal was heard which augmented slightly with compression of the fistula.  Upon completion of the case instrument and sharps counts were confirmed correct. The  patient was transferred to the PACU in good condition. I was present for all portions of the  procedure.  FOLLOW UP PLAN: Assuming a normal postoperative course, VVS PA will see the patient in 6 weeks with AVF duplex.   Rande Brunt. Lenell Antu, MD [facility] Phone Number: [phone] 05/06/2023 9:25 AM

## 2023-05-06 NOTE — Interval H&P Note (Signed)
History and Physical Interval Note:  05/06/2023 7:37 AM  Erin Davila  has presented today for surgery, with the diagnosis of Chronic kidney disease, stage V.  The various methods of treatment have been discussed with the patient and family. After consideration of risks, benefits and other options for treatment, the patient has consented to  Procedure(s): LEFT ARM ARTERIOVENOUS (AV) FISTULA CREATION (Left) as a surgical intervention.  The patient's history has been reviewed, patient examined, no change in status, stable for surgery.  I have reviewed the patient's chart and labs.  Questions were answered to the patient's satisfaction.     [name]

## 2023-05-07 ENCOUNTER — Encounter (HOSPITAL_COMMUNITY): Payer: Self-pay | Admitting: Vascular Surgery

## 2023-05-07 NOTE — Addendum Note (Signed)
Addendum  created 05/07/23 0859 by Trevor Iha, MD   Clinical Note Signed, Intraprocedure Blocks edited

## 2023-05-14 ENCOUNTER — Encounter (HOSPITAL_COMMUNITY)
Admission: RE | Admit: 2023-05-14 | Discharge: 2023-05-14 | Disposition: A | Discharge status: Home / Self Care | Payer: 59 | Source: Ambulatory Visit | Attending: Internal Medicine | Admitting: Internal Medicine

## 2023-05-14 VITALS — BP 156/72 | HR 78 | Temp 97.2°F | Resp 17

## 2023-05-14 DIAGNOSIS — D631 Anemia in chronic kidney disease: Secondary | ICD-10-CM | POA: Insufficient documentation

## 2023-05-14 DIAGNOSIS — N189 Chronic kidney disease, unspecified: Secondary | ICD-10-CM | POA: Insufficient documentation

## 2023-05-14 LAB — POCT HEMOGLOBIN-HEMACUE: Hemoglobin: 10.8 g/dL — ABNORMAL LOW (ref 12.0–15.0)

## 2023-05-14 MED ORDER — EPOETIN ALFA-EPBX 10000 UNIT/ML IJ SOLN
15000.0000 [IU] | INTRAMUSCULAR | Status: DC
Start: 2023-05-14 — End: 2023-05-15

## 2023-05-14 MED ORDER — EPOETIN ALFA-EPBX 10000 UNIT/ML IJ SOLN
INTRAMUSCULAR | Status: AC
  Administered 2023-05-14: 15000 [IU] via SUBCUTANEOUS
  Filled 2023-05-14: qty 2

## 2023-05-28 ENCOUNTER — Ambulatory Visit (HOSPITAL_COMMUNITY)
Admission: RE | Admit: 2023-05-28 | Discharge: 2023-05-28 | Disposition: A | Discharge status: Home / Self Care | Payer: 59 | Source: Ambulatory Visit | Attending: Internal Medicine | Admitting: Internal Medicine

## 2023-05-28 VITALS — BP 158/67 | HR 76 | Temp 97.3°F | Resp 18

## 2023-05-28 DIAGNOSIS — N189 Chronic kidney disease, unspecified: Secondary | ICD-10-CM | POA: Diagnosis present

## 2023-05-28 DIAGNOSIS — D631 Anemia in chronic kidney disease: Secondary | ICD-10-CM | POA: Insufficient documentation

## 2023-05-28 LAB — IRON AND TIBC
Iron: 48 ug/dL (ref 28–170)
Saturation Ratios: 20 % (ref 10.4–31.8)
TIBC: 238 ug/dL — ABNORMAL LOW (ref 250–450)
UIBC: 190 ug/dL

## 2023-05-28 LAB — POCT HEMOGLOBIN-HEMACUE: Hemoglobin: 10.8 g/dL — ABNORMAL LOW (ref 12.0–15.0)

## 2023-05-28 LAB — FERRITIN: Ferritin: 190 ng/mL (ref 11–307)

## 2023-05-28 MED ORDER — EPOETIN ALFA-EPBX 10000 UNIT/ML IJ SOLN
INTRAMUSCULAR | Status: AC
  Administered 2023-05-28: 15000 [IU] via SUBCUTANEOUS
  Filled 2023-05-28: qty 2

## 2023-05-28 MED ORDER — EPOETIN ALFA-EPBX 10000 UNIT/ML IJ SOLN: 15000.0000 [IU] | INTRAMUSCULAR | Status: DC

## 2023-05-29 ENCOUNTER — Other Ambulatory Visit: Payer: Self-pay | Admitting: *Deleted

## 2023-05-29 DIAGNOSIS — N185 Chronic kidney disease, stage 5: Secondary | ICD-10-CM

## 2023-06-10 ENCOUNTER — Other Ambulatory Visit (HOSPITAL_COMMUNITY): Payer: Self-pay | Admitting: *Deleted

## 2023-06-11 ENCOUNTER — Encounter (HOSPITAL_COMMUNITY)
Admission: RE | Admit: 2023-06-11 | Discharge: 2023-06-11 | Disposition: A | Discharge status: Home / Self Care | Payer: 59 | Source: Ambulatory Visit | Attending: Internal Medicine | Admitting: Internal Medicine

## 2023-06-11 ENCOUNTER — Ambulatory Visit (INDEPENDENT_AMBULATORY_CARE_PROVIDER_SITE_OTHER)
Admission: RE | Admit: 2023-06-11 | Discharge: 2023-06-11 | Disposition: A | Discharge status: Home / Self Care | Payer: 59 | Source: Ambulatory Visit

## 2023-06-11 ENCOUNTER — Ambulatory Visit (HOSPITAL_COMMUNITY): Payer: 59 | Attending: Vascular Surgery

## 2023-06-11 ENCOUNTER — Encounter (HOSPITAL_COMMUNITY): Payer: Self-pay

## 2023-06-11 VITALS — BP 163/67 | HR 76 | Temp 97.3°F | Resp 18

## 2023-06-11 DIAGNOSIS — N189 Chronic kidney disease, unspecified: Secondary | ICD-10-CM | POA: Diagnosis present

## 2023-06-11 DIAGNOSIS — N185 Chronic kidney disease, stage 5: Secondary | ICD-10-CM | POA: Insufficient documentation

## 2023-06-11 DIAGNOSIS — D631 Anemia in chronic kidney disease: Secondary | ICD-10-CM | POA: Diagnosis present

## 2023-06-11 LAB — POCT HEMOGLOBIN-HEMACUE: Hemoglobin: 9.7 g/dL — ABNORMAL LOW (ref 12.0–15.0)

## 2023-06-11 MED ORDER — EPOETIN ALFA-EPBX 10000 UNIT/ML IJ SOLN: 10000.0000 [IU] | INTRAMUSCULAR | Status: DC

## 2023-06-11 MED ORDER — EPOETIN ALFA-EPBX 10000 UNIT/ML IJ SOLN
INTRAMUSCULAR | Status: AC
  Administered 2023-06-11: 10000 [IU] via SUBCUTANEOUS
  Filled 2023-06-11: qty 1

## 2023-06-15 NOTE — Progress Notes (Unsigned)
[facility] [place]  ASSESSMENT / PLAN: [name] is a 62 y.o. right handed female in need of permanent dialysis access. I reviewed options for dialysis in detail with the patient, including hemodialysis and peritoneal dialysis. I counseled the patient to ask their nephrologist about their candidacy for renal transplant. I counseled the patient that dialysis access requires surveillance and periodic maintenance. Plan to proceed with left brachiocephalic arteriovenous fistula creation.  Patient counseled she may need a second surgery for superficialization.  CHIEF COMPLAINT: Worsening kidney function  HISTORY OF PRESENT ILLNESS: Erin Davila is a 62 y.o. female referred to clinic for evaluation of worsening kidney function.  The patient has never had a pacemaker report before.  She is right-handed.  She has never had dialysis access before.  We reviewed her vein mapping in detail.   Past Medical History:  Diagnosis Date   Allergy    Anemia of chronic kidney failure    stage 5   Diabetes mellitus without complication (HCC)    type 2   GERD (gastroesophageal reflux disease)    History of iron deficiency anemia    Hyperlipidemia    Hypertension     Past Surgical History:  Procedure Laterality Date   AV FISTULA PLACEMENT Left 05/06/2023   Procedure: LEFT ARM RADIOCEPHALIC ARTERIOVENOUS (AV) FISTULA CREATION;  Surgeon: [name], MD;  Location: [facility];  Service: Vascular;  Laterality: Left;   CESAREAN SECTION     x 1   INCISION AND DRAINAGE ABSCESS N/A 01/04/2021   Procedure: INCISION AND DRAINAGE LEFT BUTTOCK ABSCESS;  Surgeon: Luretha Murphy, MD;  Location: [facility];  Service: General;  Laterality: N/A;    No family history on file.  Social History   Socioeconomic History   Marital status: Single    Spouse name: Not on file   Number of children: Not on file   Years of education: Not on file   Highest education level: Not on file   Occupational History   Not on file  Tobacco Use   Smoking status: Never   Smokeless tobacco: Never  Vaping Use   Vaping status: Never Used  Substance and Sexual Activity   Alcohol use: No   Drug use: No   Sexual activity: Not Currently    Birth control/protection: Post-menopausal  Other Topics Concern   Not on file  Social History Narrative   Not on file   Social Determinants of Health   Financial Resource Strain: Not on file  Food Insecurity: Not on file  Transportation Needs: Not on file  Physical Activity: Not on file  Stress: Not on file  Social Connections: Not on file  Intimate Partner Violence: Not on file    Allergies  Allergen Reactions   Lactose Other (See Comments)    GI complaints    Current Outpatient Medications  Medication Sig Dispense Refill   acetaminophen (TYLENOL) 500 MG tablet Take 500-1,000 mg by mouth every 6 (six) hours as needed for moderate pain (pain score 4-6).     amLODipine (NORVASC) 10 MG tablet Take 10 mg by mouth daily.     calcitRIOL (ROCALTROL) 0.25 MCG capsule Take 0.25 mcg by mouth daily. Take with 0.5 mcg dose to equal 0.75 mcg once daily     calcitRIOL (ROCALTROL) 0.5 MCG capsule Take 0.5 mcg by mouth daily. Take with 0.25 mcg dose to equal 0.75 mcg once daily     carvedilol (COREG) 25 MG tablet Take 25 mg by  mouth 2 (two) times daily.     chlorthalidone (HYGROTON) 25 MG tablet Take 25 mg by mouth daily.     ferrous sulfate 325 (65 FE) MG tablet Take 325 mg by mouth 3 (three) times a week.     hydrALAZINE (APRESOLINE) 50 MG tablet Take 50 mg by mouth 2 (two) times daily.     HYDROcodone-acetaminophen (NORCO) 5-325 MG tablet Take 1 tablet by mouth every 6 (six) hours as needed for moderate pain (pain score 4-6). 10 tablet 0   hyoscyamine (LEVSIN) 0.125 MG/5ML ELIX Take 10 mLs (0.25 mg total) by mouth every 6 (six) hours as needed for cramping (abdominal pain/discomfort). 600 mL 0   insulin glargine (LANTUS SOLOSTAR) 100 UNIT/ML  Solostar Pen Inject 44 Units into the skin daily. (Patient taking differently: Inject 38 Units into the skin at bedtime.) 15 mL 11   montelukast (SINGULAIR) 10 MG tablet Take 1 tablet (10 mg total) by mouth at bedtime. 30 tablet 0   Multiple Vitamin (MULTIVITAMIN WITH MINERALS) TABS tablet Take 1 tablet by mouth daily.     OZEMPIC, 2 MG/DOSE, 8 MG/3ML SOPN Inject 2 mg into the skin every Monday.     pantoprazole (PROTONIX) 40 MG tablet Take 1 tablet (40 mg total) by mouth daily. 30 tablet 0   simvastatin (ZOCOR) 40 MG tablet Take 1 tablet (40 mg total) by mouth at bedtime. 30 tablet 0   No current facility-administered medications for this visit.    PHYSICAL EXAM There were no vitals filed for this visit.   Obese elderly woman in no distress Regular rate and rhythm Unlabored breathing 2+ radial pulses bilaterally   PERTINENT LABORATORY AND RADIOLOGIC DATA  Most recent CBC    Latest Ref Rng & Units 06/11/2023    9:36 AM 05/28/2023    9:37 AM 05/14/2023    9:17 AM  CBC  Hemoglobin 12.0 - 15.0 g/dL 9.7  29.5  62.1      Most recent CMP    Latest Ref Rng & Units 05/06/2023    6:44 AM 01/19/2021   11:44 AM 01/18/2021    6:43 AM  CMP  Glucose 70 - 99 mg/dL 97  308  92   BUN 6 - 20 mg/dL 40  27  30   Creatinine 0.44 - 1.00 mg/dL 6.57  8.46  9.62   Sodium 135 - 145 mmol/L 144  138  135   Potassium 3.5 - 5.1 mmol/L 4.4  4.7  4.4   Chloride 98 - 111 mmol/L 114  107  103   CO2 22 - 32 mmol/L  23  24   Calcium 8.9 - 10.3 mg/dL  8.9  8.9     Renal function CrCl cannot be calculated (Patient's most recent lab result is older than the maximum 21 days allowed.).  Hgb A1c MFr Bld (%)  Date Value  01/03/2021 12.8 (H)   Vein mapping shows adequate left arm cephalic vein for fistula  Rande Brunt. Lenell Antu, MD FACS [facility] Phone Number: [phone] 06/15/2023 9:16 PM   Total time spent on preparing this encounter including chart review,  data review, collecting history, examining the patient, coordinating care for this new patient, 60 minutes.  Portions of this report may have been transcribed using voice recognition software.  Every effort has been made to ensure accuracy; however, inadvertent computerized transcription errors may still be present.

## 2023-06-16 ENCOUNTER — Encounter (HOSPITAL_COMMUNITY): Payer: 59

## 2023-06-16 ENCOUNTER — Encounter: Payer: Self-pay | Admitting: Vascular Surgery

## 2023-06-16 ENCOUNTER — Ambulatory Visit (INDEPENDENT_AMBULATORY_CARE_PROVIDER_SITE_OTHER): Payer: 59 | Admitting: Vascular Surgery

## 2023-06-16 VITALS — BP 182/87 | HR 75 | Temp 97.9°F | Resp 20 | Ht 68.0 in | Wt >= 6400 oz

## 2023-06-16 DIAGNOSIS — N185 Chronic kidney disease, stage 5: Secondary | ICD-10-CM

## 2023-06-25 ENCOUNTER — Ambulatory Visit (HOSPITAL_COMMUNITY)
Admission: RE | Admit: 2023-06-25 | Discharge: 2023-06-25 | Disposition: A | Discharge status: Home / Self Care | Payer: 59 | Source: Ambulatory Visit | Attending: Internal Medicine | Admitting: Internal Medicine

## 2023-06-25 VITALS — BP 155/69 | HR 70 | Temp 97.8°F | Resp 18 | Wt >= 6400 oz

## 2023-06-25 DIAGNOSIS — D631 Anemia in chronic kidney disease: Secondary | ICD-10-CM | POA: Diagnosis present

## 2023-06-25 DIAGNOSIS — N189 Chronic kidney disease, unspecified: Secondary | ICD-10-CM | POA: Insufficient documentation

## 2023-06-25 LAB — POCT HEMOGLOBIN-HEMACUE: Hemoglobin: 9.4 g/dL — ABNORMAL LOW (ref 12.0–15.0)

## 2023-06-25 MED ORDER — EPOETIN ALFA-EPBX 10000 UNIT/ML IJ SOLN
10000.0000 [IU] | INTRAMUSCULAR | Status: DC
  Administered 2023-06-25: 10000 [IU] via SUBCUTANEOUS

## 2023-06-25 MED ORDER — EPOETIN ALFA-EPBX 10000 UNIT/ML IJ SOLN
INTRAMUSCULAR | Status: AC
  Filled 2023-06-25: qty 1

## 2023-06-25 MED ORDER — SODIUM CHLORIDE 0.9 % IV SOLN
510.0000 mg | INTRAVENOUS | Status: DC
  Administered 2023-06-25: 510 mg via INTRAVENOUS
  Filled 2023-06-25: qty 510

## 2023-07-09 ENCOUNTER — Encounter (HOSPITAL_COMMUNITY)
Admission: RE | Admit: 2023-07-09 | Discharge: 2023-07-09 | Disposition: A | Discharge status: Home / Self Care | Payer: 59 | Source: Ambulatory Visit | Attending: Internal Medicine | Admitting: Internal Medicine

## 2023-07-09 VITALS — BP 164/72 | HR 71 | Temp 98.0°F | Resp 18 | Wt >= 6400 oz

## 2023-07-09 DIAGNOSIS — D631 Anemia in chronic kidney disease: Secondary | ICD-10-CM | POA: Diagnosis present

## 2023-07-09 DIAGNOSIS — N189 Chronic kidney disease, unspecified: Secondary | ICD-10-CM | POA: Diagnosis present

## 2023-07-09 LAB — POCT HEMOGLOBIN-HEMACUE: Hemoglobin: 8.5 g/dL — ABNORMAL LOW (ref 12.0–15.0)

## 2023-07-09 MED ORDER — EPOETIN ALFA-EPBX 10000 UNIT/ML IJ SOLN: 20000.0000 [IU] | INTRAMUSCULAR | Status: DC

## 2023-07-09 MED ORDER — SODIUM CHLORIDE 0.9 % IV SOLN
510.0000 mg | INTRAVENOUS | Status: DC
  Administered 2023-07-09: 510 mg via INTRAVENOUS
  Filled 2023-07-09: qty 510

## 2023-07-09 MED ORDER — EPOETIN ALFA-EPBX 10000 UNIT/ML IJ SOLN
INTRAMUSCULAR | Status: AC
  Administered 2023-07-09: 20000 [IU] via SUBCUTANEOUS
  Filled 2023-07-09: qty 2

## 2023-07-23 ENCOUNTER — Encounter (HOSPITAL_COMMUNITY)
Admission: RE | Admit: 2023-07-23 | Discharge: 2023-07-23 | Disposition: A | Discharge status: Home / Self Care | Payer: 59 | Source: Ambulatory Visit | Attending: Internal Medicine | Admitting: Internal Medicine

## 2023-07-23 VITALS — BP 166/71 | HR 73 | Temp 97.4°F | Resp 17

## 2023-07-23 DIAGNOSIS — D631 Anemia in chronic kidney disease: Secondary | ICD-10-CM

## 2023-07-23 DIAGNOSIS — N189 Chronic kidney disease, unspecified: Secondary | ICD-10-CM | POA: Diagnosis not present

## 2023-07-23 LAB — POCT HEMOGLOBIN-HEMACUE: Hemoglobin: 8.5 g/dL — ABNORMAL LOW (ref 12.0–15.0)

## 2023-07-23 LAB — FERRITIN: Ferritin: 340 ng/mL — ABNORMAL HIGH (ref 11–307)

## 2023-07-23 LAB — IRON AND TIBC
Iron: 52 ug/dL (ref 28–170)
Saturation Ratios: 22 % (ref 10.4–31.8)
TIBC: 238 ug/dL — ABNORMAL LOW (ref 250–450)
UIBC: 186 ug/dL

## 2023-07-23 MED ORDER — EPOETIN ALFA-EPBX 40000 UNIT/ML IJ SOLN
INTRAMUSCULAR | Status: AC
  Filled 2023-07-23: qty 1

## 2023-07-23 MED ORDER — EPOETIN ALFA-EPBX 40000 UNIT/ML IJ SOLN
40000.0000 [IU] | INTRAMUSCULAR | Status: DC
  Administered 2023-07-23: 40000 [IU] via SUBCUTANEOUS

## 2023-08-05 ENCOUNTER — Other Ambulatory Visit (HOSPITAL_COMMUNITY): Payer: Self-pay | Admitting: *Deleted

## 2023-08-06 ENCOUNTER — Encounter (HOSPITAL_COMMUNITY)
Admission: RE | Admit: 2023-08-06 | Discharge: 2023-08-06 | Disposition: A | Discharge status: Home / Self Care | Payer: 59 | Source: Ambulatory Visit | Attending: Internal Medicine | Admitting: Internal Medicine

## 2023-08-06 VITALS — BP 166/75 | HR 74 | Temp 97.5°F | Resp 18

## 2023-08-06 DIAGNOSIS — D631 Anemia in chronic kidney disease: Secondary | ICD-10-CM

## 2023-08-06 DIAGNOSIS — N189 Chronic kidney disease, unspecified: Secondary | ICD-10-CM | POA: Diagnosis not present

## 2023-08-06 LAB — POCT HEMOGLOBIN-HEMACUE: Hemoglobin: 8.8 g/dL — ABNORMAL LOW (ref 12.0–15.0)

## 2023-08-06 MED ORDER — EPOETIN ALFA-EPBX 40000 UNIT/ML IJ SOLN
INTRAMUSCULAR | Status: AC
  Filled 2023-08-06: qty 1

## 2023-08-06 MED ORDER — EPOETIN ALFA-EPBX 40000 UNIT/ML IJ SOLN
40000.0000 [IU] | INTRAMUSCULAR | Status: DC
Start: 2023-08-06 — End: 2023-08-07

## 2023-08-20 ENCOUNTER — Inpatient Hospital Stay (HOSPITAL_COMMUNITY): Admission: RE | Admit: 2023-08-20 | Payer: 59 | Source: Ambulatory Visit

## 2023-08-24 ENCOUNTER — Ambulatory Visit (HOSPITAL_COMMUNITY)
Admission: RE | Admit: 2023-08-24 | Discharge: 2023-08-24 | Disposition: A | Discharge status: Home / Self Care | Payer: 59 | Source: Ambulatory Visit | Attending: Internal Medicine | Admitting: Internal Medicine

## 2023-08-24 VITALS — BP 148/62 | HR 65 | Temp 97.7°F | Resp 18 | Wt >= 6400 oz

## 2023-08-24 DIAGNOSIS — D631 Anemia in chronic kidney disease: Secondary | ICD-10-CM | POA: Insufficient documentation

## 2023-08-24 DIAGNOSIS — I132 Hypertensive heart and chronic kidney disease with heart failure and with stage 5 chronic kidney disease, or end stage renal disease: Secondary | ICD-10-CM | POA: Diagnosis not present

## 2023-08-24 DIAGNOSIS — R0902 Hypoxemia: Secondary | ICD-10-CM | POA: Diagnosis not present

## 2023-08-24 DIAGNOSIS — N189 Chronic kidney disease, unspecified: Secondary | ICD-10-CM | POA: Insufficient documentation

## 2023-08-24 LAB — POCT HEMOGLOBIN-HEMACUE: Hemoglobin: 7.9 g/dL — ABNORMAL LOW (ref 12.0–15.0)

## 2023-08-24 MED ORDER — SODIUM CHLORIDE 0.9 % IV SOLN
510.0000 mg | INTRAVENOUS | Status: DC
  Administered 2023-08-24: 510 mg via INTRAVENOUS
  Filled 2023-08-24: qty 510

## 2023-08-24 MED ORDER — EPOETIN ALFA 40000 UNIT/ML IJ SOLN: 40000.0000 [IU] | INTRAMUSCULAR | Status: DC

## 2023-08-24 MED ORDER — EPOETIN ALFA 40000 UNIT/ML IJ SOLN
INTRAMUSCULAR | Status: AC
  Administered 2023-08-24: 40000 [IU] via SUBCUTANEOUS
  Filled 2023-08-24: qty 1

## 2023-08-26 ENCOUNTER — Other Ambulatory Visit: Payer: Self-pay

## 2023-08-26 ENCOUNTER — Inpatient Hospital Stay (HOSPITAL_COMMUNITY)
Admission: EM | Admit: 2023-08-26 | Discharge: 2023-09-04 | DRG: 252 | Disposition: A | Discharge status: Home / Self Care | Payer: 59 | Attending: Family Medicine | Admitting: Family Medicine

## 2023-08-26 ENCOUNTER — Encounter (HOSPITAL_COMMUNITY): Payer: Self-pay | Admitting: Student in an Organized Health Care Education/Training Program

## 2023-08-26 ENCOUNTER — Encounter (HOSPITAL_COMMUNITY): Payer: Self-pay

## 2023-08-26 ENCOUNTER — Inpatient Hospital Stay (HOSPITAL_COMMUNITY)
Admit: 2023-08-26 | Discharge: 2023-08-26 | Disposition: A | Discharge status: Home / Self Care | Payer: 59 | Attending: Student in an Organized Health Care Education/Training Program | Admitting: Student in an Organized Health Care Education/Training Program

## 2023-08-26 ENCOUNTER — Emergency Department (HOSPITAL_COMMUNITY): Payer: 59

## 2023-08-26 DIAGNOSIS — N186 End stage renal disease: Secondary | ICD-10-CM | POA: Diagnosis present

## 2023-08-26 DIAGNOSIS — I132 Hypertensive heart and chronic kidney disease with heart failure and with stage 5 chronic kidney disease, or end stage renal disease: Secondary | ICD-10-CM | POA: Diagnosis present

## 2023-08-26 DIAGNOSIS — E66813 Obesity, class 3: Secondary | ICD-10-CM | POA: Diagnosis present

## 2023-08-26 DIAGNOSIS — E11649 Type 2 diabetes mellitus with hypoglycemia without coma: Secondary | ICD-10-CM | POA: Diagnosis not present

## 2023-08-26 DIAGNOSIS — D631 Anemia in chronic kidney disease: Secondary | ICD-10-CM | POA: Diagnosis present

## 2023-08-26 DIAGNOSIS — Z79899 Other long term (current) drug therapy: Secondary | ICD-10-CM | POA: Diagnosis not present

## 2023-08-26 DIAGNOSIS — I509 Heart failure, unspecified: Secondary | ICD-10-CM | POA: Diagnosis not present

## 2023-08-26 DIAGNOSIS — N179 Acute kidney failure, unspecified: Secondary | ICD-10-CM | POA: Diagnosis not present

## 2023-08-26 DIAGNOSIS — E739 Lactose intolerance, unspecified: Secondary | ICD-10-CM | POA: Diagnosis present

## 2023-08-26 DIAGNOSIS — Z1152 Encounter for screening for COVID-19: Secondary | ICD-10-CM

## 2023-08-26 DIAGNOSIS — Z794 Long term (current) use of insulin: Secondary | ICD-10-CM | POA: Diagnosis not present

## 2023-08-26 DIAGNOSIS — I10 Essential (primary) hypertension: Secondary | ICD-10-CM

## 2023-08-26 DIAGNOSIS — R0902 Hypoxemia: Secondary | ICD-10-CM | POA: Diagnosis present

## 2023-08-26 DIAGNOSIS — R0600 Dyspnea, unspecified: Secondary | ICD-10-CM

## 2023-08-26 DIAGNOSIS — N184 Chronic kidney disease, stage 4 (severe): Secondary | ICD-10-CM

## 2023-08-26 DIAGNOSIS — J9601 Acute respiratory failure with hypoxia: Secondary | ICD-10-CM | POA: Diagnosis present

## 2023-08-26 DIAGNOSIS — Z6841 Body Mass Index (BMI) 40.0 and over, adult: Secondary | ICD-10-CM

## 2023-08-26 DIAGNOSIS — Z7985 Long-term (current) use of injectable non-insulin antidiabetic drugs: Secondary | ICD-10-CM | POA: Diagnosis not present

## 2023-08-26 DIAGNOSIS — E1122 Type 2 diabetes mellitus with diabetic chronic kidney disease: Secondary | ICD-10-CM | POA: Diagnosis present

## 2023-08-26 DIAGNOSIS — I5033 Acute on chronic diastolic (congestive) heart failure: Secondary | ICD-10-CM | POA: Diagnosis present

## 2023-08-26 DIAGNOSIS — M7989 Other specified soft tissue disorders: Secondary | ICD-10-CM | POA: Diagnosis not present

## 2023-08-26 DIAGNOSIS — R0989 Other specified symptoms and signs involving the circulatory and respiratory systems: Secondary | ICD-10-CM

## 2023-08-26 DIAGNOSIS — I130 Hypertensive heart and chronic kidney disease with heart failure and stage 1 through stage 4 chronic kidney disease, or unspecified chronic kidney disease: Secondary | ICD-10-CM | POA: Diagnosis not present

## 2023-08-26 DIAGNOSIS — N185 Chronic kidney disease, stage 5: Secondary | ICD-10-CM | POA: Diagnosis not present

## 2023-08-26 DIAGNOSIS — K219 Gastro-esophageal reflux disease without esophagitis: Secondary | ICD-10-CM | POA: Diagnosis present

## 2023-08-26 DIAGNOSIS — E785 Hyperlipidemia, unspecified: Secondary | ICD-10-CM | POA: Diagnosis present

## 2023-08-26 DIAGNOSIS — Z23 Encounter for immunization: Secondary | ICD-10-CM

## 2023-08-26 LAB — CBC
HCT: 29.8 % — ABNORMAL LOW (ref 36.0–46.0)
HCT: 28.7 % — ABNORMAL LOW (ref 36.0–46.0)
Hemoglobin: 8.6 g/dL — ABNORMAL LOW (ref 12.0–15.0)
Hemoglobin: 8.4 g/dL — ABNORMAL LOW (ref 12.0–15.0)
MCH: 31.4 pg (ref 26.0–34.0)
MCH: 32.1 pg (ref 26.0–34.0)
MCHC: 28.9 g/dL — ABNORMAL LOW (ref 30.0–36.0)
MCHC: 29.3 g/dL — ABNORMAL LOW (ref 30.0–36.0)
MCV: 108.8 fL — ABNORMAL HIGH (ref 80.0–100.0)
MCV: 109.5 fL — ABNORMAL HIGH (ref 80.0–100.0)
Platelets: 244 10*3/uL (ref 150–400)
Platelets: 243 10*3/uL (ref 150–400)
RBC: 2.74 MIL/uL — ABNORMAL LOW (ref 3.87–5.11)
RBC: 2.62 MIL/uL — ABNORMAL LOW (ref 3.87–5.11)
RDW: 15.8 % — ABNORMAL HIGH (ref 11.5–15.5)
RDW: 15.7 % — ABNORMAL HIGH (ref 11.5–15.5)
WBC: 7 10*3/uL (ref 4.0–10.5)
WBC: 6.6 10*3/uL (ref 4.0–10.5)
nRBC: 0.4 % — ABNORMAL HIGH (ref 0.0–0.2)
nRBC: 0.6 % — ABNORMAL HIGH (ref 0.0–0.2)

## 2023-08-26 LAB — HIV ANTIBODY (ROUTINE TESTING W REFLEX): HIV Screen 4th Generation wRfx: NONREACTIVE

## 2023-08-26 LAB — COMPREHENSIVE METABOLIC PANEL
ALT: 22 U/L (ref 0–44)
AST: 14 U/L — ABNORMAL LOW (ref 15–41)
Albumin: 3.4 g/dL — ABNORMAL LOW (ref 3.5–5.0)
Alkaline Phosphatase: 83 U/L (ref 38–126)
Anion gap: 9 (ref 5–15)
BUN: 57 mg/dL — ABNORMAL HIGH (ref 8–23)
CO2: 20 mmol/L — ABNORMAL LOW (ref 22–32)
Calcium: 8.7 mg/dL — ABNORMAL LOW (ref 8.9–10.3)
Chloride: 115 mmol/L — ABNORMAL HIGH (ref 98–111)
Creatinine, Ser: 5.31 mg/dL — ABNORMAL HIGH (ref 0.44–1.00)
GFR, Estimated: 9 mL/min — ABNORMAL LOW (ref >60)
Glucose, Bld: 131 mg/dL — ABNORMAL HIGH (ref 70–99)
Potassium: 4.6 mmol/L (ref 3.5–5.1)
Sodium: 144 mmol/L (ref 135–145)
Total Bilirubin: 0.8 mg/dL (ref 0.0–1.2)
Total Protein: 7.7 g/dL (ref 6.5–8.1)

## 2023-08-26 LAB — TROPONIN I (HIGH SENSITIVITY)
Troponin I (High Sensitivity): 8 ng/L (ref <18)
Troponin I (High Sensitivity): 8 ng/L (ref <18)

## 2023-08-26 LAB — RESP PANEL BY RT-PCR (RSV, FLU A&B, COVID)  RVPGX2
Influenza A by PCR: NEGATIVE
Influenza B by PCR: NEGATIVE
Resp Syncytial Virus by PCR: NEGATIVE
SARS Coronavirus 2 by RT PCR: NEGATIVE

## 2023-08-26 LAB — BRAIN NATRIURETIC PEPTIDE: B Natriuretic Peptide: 452.5 pg/mL — ABNORMAL HIGH (ref 0.0–100.0)

## 2023-08-26 LAB — D-DIMER, QUANTITATIVE: D-Dimer, Quant: 0.88 ug{FEU}/mL — ABNORMAL HIGH (ref 0.00–0.50)

## 2023-08-26 LAB — CREATININE, SERUM
Creatinine, Ser: 5.45 mg/dL — ABNORMAL HIGH (ref 0.44–1.00)
GFR, Estimated: 8 mL/min — ABNORMAL LOW (ref >60)

## 2023-08-26 MED ORDER — IPRATROPIUM-ALBUTEROL 0.5-2.5 (3) MG/3ML IN SOLN
3.0000 mL | Freq: Four times a day (QID) | RESPIRATORY_TRACT | Status: DC
  Administered 2023-08-26 (×2): 3 mL via RESPIRATORY_TRACT
  Filled 2023-08-26 (×2): qty 3

## 2023-08-26 MED ORDER — HYDRALAZINE HCL 50 MG PO TABS
50.0000 mg | ORAL_TABLET | Freq: Three times a day (TID) | ORAL | Status: DC
  Administered 2023-08-26 – 2023-08-31 (×16): 50 mg via ORAL
  Filled 2023-08-26 (×16): qty 1

## 2023-08-26 MED ORDER — CARVEDILOL 6.25 MG PO TABS
6.2500 mg | ORAL_TABLET | Freq: Two times a day (BID) | ORAL | Status: DC
  Administered 2023-08-27 – 2023-09-04 (×17): 6.25 mg via ORAL
  Filled 2023-08-26 (×17): qty 1

## 2023-08-26 MED ORDER — FUROSEMIDE 10 MG/ML IJ SOLN
60.0000 mg | Freq: Once | INTRAMUSCULAR | Status: AC
  Administered 2023-08-26: 60 mg via INTRAVENOUS
  Filled 2023-08-26: qty 8

## 2023-08-26 MED ORDER — IPRATROPIUM-ALBUTEROL 0.5-2.5 (3) MG/3ML IN SOLN
3.0000 mL | Freq: Three times a day (TID) | RESPIRATORY_TRACT | Status: AC
  Administered 2023-08-27 (×2): 3 mL via RESPIRATORY_TRACT
  Filled 2023-08-26 (×2): qty 3

## 2023-08-26 MED ORDER — ACETAMINOPHEN 650 MG RE SUPP: 650.0000 mg | Freq: Four times a day (QID) | RECTAL | Status: DC | PRN

## 2023-08-26 MED ORDER — BISACODYL 5 MG PO TBEC
5.0000 mg | DELAYED_RELEASE_TABLET | Freq: Every day | ORAL | Status: DC | PRN
  Administered 2023-09-04: 5 mg via ORAL
  Filled 2023-08-26: qty 1

## 2023-08-26 MED ORDER — INSULIN GLARGINE-YFGN 100 UNIT/ML ~~LOC~~ SOLN
38.0000 [IU] | Freq: Every day | SUBCUTANEOUS | Status: DC
  Administered 2023-08-27 – 2023-08-29 (×3): 38 [IU] via SUBCUTANEOUS
  Filled 2023-08-26 (×3): qty 0.38

## 2023-08-26 MED ORDER — AMLODIPINE BESYLATE 10 MG PO TABS
10.0000 mg | ORAL_TABLET | Freq: Every day | ORAL | Status: DC
  Administered 2023-08-26 – 2023-09-03 (×9): 10 mg via ORAL
  Filled 2023-08-26 (×9): qty 1

## 2023-08-26 MED ORDER — ATORVASTATIN CALCIUM 10 MG PO TABS
20.0000 mg | ORAL_TABLET | Freq: Every day | ORAL | Status: DC
Start: 2023-08-27 — End: 2023-09-05
  Administered 2023-08-27 – 2023-09-03 (×8): 20 mg via ORAL
  Filled 2023-08-26 (×7): qty 2
  Filled 2023-08-26: qty 1

## 2023-08-26 MED ORDER — INFLUENZA VIRUS VACC SPLIT PF (FLUZONE) 0.5 ML IM SUSY
0.5000 mL | PREFILLED_SYRINGE | INTRAMUSCULAR | Status: AC
  Administered 2023-08-30: 0.5 mL via INTRAMUSCULAR
  Filled 2023-08-26 (×2): qty 0.5

## 2023-08-26 MED ORDER — ACETAMINOPHEN 325 MG PO TABS
650.0000 mg | ORAL_TABLET | Freq: Four times a day (QID) | ORAL | Status: DC | PRN
  Administered 2023-08-30 – 2023-09-04 (×8): 650 mg via ORAL
  Filled 2023-08-26 (×8): qty 2

## 2023-08-26 MED ORDER — SIMVASTATIN 40 MG PO TABS: 40.0000 mg | ORAL_TABLET | Freq: Every day | ORAL | Status: DC

## 2023-08-26 MED ORDER — IPRATROPIUM-ALBUTEROL 0.5-2.5 (3) MG/3ML IN SOLN: 3.0000 mL | Freq: Three times a day (TID) | RESPIRATORY_TRACT | Status: DC

## 2023-08-26 MED ORDER — ALBUTEROL SULFATE HFA 108 (90 BASE) MCG/ACT IN AERS: 2.0000 | INHALATION_SPRAY | RESPIRATORY_TRACT | Status: DC | PRN

## 2023-08-26 MED ORDER — CALCITRIOL 0.5 MCG PO CAPS
0.5000 ug | ORAL_CAPSULE | Freq: Every day | ORAL | Status: DC
  Administered 2023-08-27 – 2023-09-04 (×9): 0.5 ug via ORAL
  Filled 2023-08-26 (×4): qty 1
  Filled 2023-08-26: qty 2
  Filled 2023-08-26 (×4): qty 1

## 2023-08-26 MED ORDER — FUROSEMIDE 10 MG/ML IJ SOLN
40.0000 mg | Freq: Once | INTRAMUSCULAR | Status: DC
  Filled 2023-08-26: qty 4

## 2023-08-26 MED ORDER — BUDESONIDE 0.5 MG/2ML IN SUSP
0.5000 mg | Freq: Once | RESPIRATORY_TRACT | Status: AC
  Administered 2023-08-26: 0.5 mg via RESPIRATORY_TRACT
  Filled 2023-08-26: qty 2

## 2023-08-26 MED ORDER — HEPARIN SODIUM (PORCINE) 5000 UNIT/ML IJ SOLN
5000.0000 [IU] | Freq: Three times a day (TID) | INTRAMUSCULAR | Status: DC
  Administered 2023-08-26 – 2023-09-04 (×27): 5000 [IU] via SUBCUTANEOUS
  Filled 2023-08-26 (×27): qty 1

## 2023-08-26 MED ORDER — POLYETHYLENE GLYCOL 3350 17 G PO PACK
17.0000 g | PACK | Freq: Every day | ORAL | Status: DC
  Administered 2023-08-26 – 2023-09-02 (×3): 17 g via ORAL
  Filled 2023-08-26 (×7): qty 1

## 2023-08-26 MED ORDER — IPRATROPIUM-ALBUTEROL 0.5-2.5 (3) MG/3ML IN SOLN
3.0000 mL | Freq: Once | RESPIRATORY_TRACT | Status: AC
  Administered 2023-08-26: 3 mL via RESPIRATORY_TRACT
  Filled 2023-08-26: qty 3

## 2023-08-26 NOTE — ED Triage Notes (Signed)
Pt arrived via POV. C/o SOB for 2x weeks. Hx stage 4 CKD. No chest pain.  AOx4

## 2023-08-26 NOTE — Progress Notes (Signed)
Bilateral lower extremity venous duplex has been completed. Preliminary results can be found in CV Proc through chart review.   08/26/23 2:57 PM Olen Cordial RVT

## 2023-08-26 NOTE — Progress Notes (Signed)
Pharmacy Brief Note  FDA warning states Simvastatin dose should not exceed 20mg /day in patients taking amlodipine due to increased risk of rhabdomyolysis.  Atorvastatin (Lipitor) 20mg  was substituted for simvastatin 40mg  per P&T policy.   Please consider at discharge.  Thanks, Pharmacy.   Lynann Beaver PharmD, BCPS [facility] [phone] 08/26/2023 7:55 PM

## 2023-08-26 NOTE — H&P (Addendum)
History and Physical   Erin Davila ZOX:[id number] DOB: [birth year] DOA: 08/26/2023 PCP: [name], MD  Chief Complaint: SOB Historian: patient  HPI:  Erin Davila is a 63 y.o. 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 At baseline, they are independent with ADLs.  They presented from home to the ED on 08/26/2023 with SOB x a couple months intermittently but severely worsened today. She states that since October she has been working from home and more sedentary since then. She endorses lower extremity swelling during this time and thinks its due to sitting longer periods of time. She has intermittent shortness of breath which is worse with temperature changes so thinks the cold weather is what worsened it today. Does not use oxygen at baseline and doesn't recall using it or breathing treatments in the past. Denies coughing, rhinorrhea. Denies orthopnea.  In the ED, it was found that they had new oxygen requirement of 2L to maintain sats >90%. She's hypertensive up to 170s systolics but otherwise stable.  Significant findings included: D-dimer 0.88. troponin 8>8. Respiratory panel negative. BNP 452.5. metabolic panel significant for Cr 5.31. last one in system is from 2024 and was 5.9. WBC 7.0, hgb 8.6 Chest xray: cardiomegaly with central pulmonary vascular congestion.  They were initially treated with breathing treatment.   Patient was admitted to medicine service for further workup and management of hypoxia as outlined in detail below.  Assessment/Plan Principal Problem:   Hypoxia   Acute hypoxic respiratory failure- no URVI symptoms, chest xray shows vascular congestion. BNP and d-dimer elevated. Negative DVT on LE doppler. RVP negative. Suspect CHF exacerbation despite no h/o. Emphysema unlikely. PE possibility with sedentary lifestyle increasing.  - judicial trial of IV lasix and careful monitoring of UOP and renal  function - wean O2 as tolerated - echo to evaluate for suspected HF - breathing treatments - daily weights - strict I/O - fluid restrictions  HTN- poorly controlled. Systolics 170s.  - continue home amlodipine, carvedilol, hydralazine, chlorthalidone  CKD IV- fistula in place. Appears to have roughly stable Cr from baseline on presentation.  - follow Cr closely with lasix use - nephrology consulted - continue home calcitrol  Type II DM- takes ozempic at home. home 44 units daily continued  HLD- continue home simvastatin  GERD- continue home protonix  Past Medical History:  Diagnosis Date   Allergy    Anemia of chronic kidney failure    stage 5   Diabetes mellitus without complication (HCC)    type 2   GERD (gastroesophageal reflux disease)    History of iron deficiency anemia    Hyperlipidemia    Hypertension     Past Surgical History:  Procedure Laterality Date   AV FISTULA PLACEMENT Left 05/06/2023   Procedure: LEFT ARM RADIOCEPHALIC ARTERIOVENOUS (AV) FISTULA CREATION;  Surgeon: [name], MD;  Location: [facility];  Service: Vascular;  Laterality: Left;   CESAREAN SECTION     x 1   INCISION AND DRAINAGE ABSCESS N/A 01/04/2021   Procedure: INCISION AND DRAINAGE LEFT BUTTOCK ABSCESS;  Surgeon: Luretha Murphy, MD;  Location: [facility];  Service: General;  Laterality: N/A;     reports that she has never smoked. She has never used smokeless tobacco. She reports that she does not drink alcohol and does not use drugs.  Allergies  Allergen Reactions   Lactose Other (See Comments)    GI complaints  No family history on file.  Prior to Admission medications   Medication Sig Start Date End Date Taking? Authorizing Provider  acetaminophen (TYLENOL) 500 MG tablet Take 500-1,000 mg by mouth every 6 (six) hours as needed for moderate pain (pain score 4-6).    [provider]  amLODipine (NORVASC) 10 MG tablet Take 10 mg by mouth daily.    [provider]  calcitRIOL (ROCALTROL) 0.25 MCG capsule Take 0.25 mcg by mouth daily. Take with 0.5 mcg dose to equal 0.75 mcg once daily 03/21/23   [provider]  calcitRIOL (ROCALTROL) 0.5 MCG capsule Take 0.5 mcg by mouth daily. Take with 0.25 mcg dose to equal 0.75 mcg once daily    [provider]  carvedilol (COREG) 25 MG tablet Take 25 mg by mouth 2 (two) times daily. 02/05/23   [provider]  chlorthalidone (HYGROTON) 25 MG tablet Take 25 mg by mouth daily. 03/01/23   [provider]  ferrous sulfate 325 (65 FE) MG tablet Take 325 mg by mouth 3 (three) times a week.    [provider]  hydrALAZINE (APRESOLINE) 50 MG tablet Take 50 mg by mouth 2 (two) times daily. 03/05/23   [provider]  HYDROcodone-acetaminophen (NORCO) 5-325 MG tablet Take 1 tablet by mouth every 6 (six) hours as needed for moderate pain (pain score 4-6). 05/06/23   Emilie Rutter, PA-C  hyoscyamine (LEVSIN) 0.125 MG/5ML ELIX Take 10 mLs (0.25 mg total) by mouth every 6 (six) hours as needed for cramping (abdominal pain/discomfort). 01/21/21   Angiulli, Mcarthur Rossetti, PA-C  insulin glargine (LANTUS SOLOSTAR) 100 UNIT/ML Solostar Pen Inject 44 Units into the skin daily. Patient taking differently: Inject 38 Units into the skin at bedtime. 01/21/21   Angiulli, Mcarthur Rossetti, PA-C  montelukast (SINGULAIR) 10 MG tablet Take 1 tablet (10 mg total) by mouth at bedtime. 01/21/21   Angiulli, Mcarthur Rossetti, PA-C  Multiple Vitamin (MULTIVITAMIN WITH MINERALS) TABS tablet Take 1 tablet by mouth daily.    [provider]  OZEMPIC, 2 MG/DOSE, 8 MG/3ML SOPN Inject 2 mg into the skin every Monday. 03/30/23   [provider]  pantoprazole (PROTONIX) 40 MG tablet Take 1 tablet (40 mg total) by mouth daily. 01/21/21   Angiulli, Mcarthur Rossetti, PA-C  simvastatin (ZOCOR) 40 MG tablet Take 1 tablet (40 mg total) by mouth at bedtime. 01/21/21   Angiulli, Mcarthur Rossetti, PA-C   I have personally,  briefly reviewed patient's prior medical records in  Link  Objective: Blood pressure (!) 155/65, pulse 71, temperature 97.9 F (36.6 C), temperature source Oral, resp. rate (!) 24, height 5\' 8"  (1.727 m), weight (!) 181 kg, last menstrual period 03/26/2015, SpO2 94%.   Constitutional: NAD, calm, comfortable HEENT: lids and conjunctivae normal. MMM. Posterior pharynx clear of any exudate or lesions. Normal dentition.  Neck: normal, supple, no masses, no thyromegaly Respiratory: CTAB, no wheezing, no crackles. Normal respiratory effort. No accessory muscle use.  Cardiovascular: RRR, no murmurs / rubs / gallops. Non-pitting extremity edema. 2+ pedal pulses. no clubbing / cyanosis.  Abdomen: soft, NT, ND, no masses or HSM palpated. Musculoskeletal: No joint deformity upper and lower extremities. Normal muscle tone.  Skin: dry, intact, normal color, normal temperature on exposed skin Neurologic: Alert and oriented x 3. Normal speech. Grossly non-focal exam. PERRL Psychiatric: Normal mood. Congruent affect.  Labs on Admission: I have personally reviewed admission labs and imaging studies  CBC    Component Value Date/Time   WBC  7.0 08/26/2023 1025   RBC 2.74 (L) 08/26/2023 1025   HGB 8.6 (L) 08/26/2023 1025   HCT 29.8 (L) 08/26/2023 1025   PLT 244 08/26/2023 1025   MCV 108.8 (H) 08/26/2023 1025   MCH 31.4 08/26/2023 1025   MCHC 28.9 (L) 08/26/2023 1025   RDW 15.8 (H) 08/26/2023 1025   LYMPHSABS 1.8 01/20/2021 0722   MONOABS 0.9 01/20/2021 0722   EOSABS 0.5 01/20/2021 0722   BASOSABS 0.0 01/20/2021 0722   CMP     Component Value Date/Time   NA 144 08/26/2023 1025   K 4.6 08/26/2023 1025   CL 115 (H) 08/26/2023 1025   CO2 20 (L) 08/26/2023 1025   GLUCOSE 131 (H) 08/26/2023 1025   BUN 57 (H) 08/26/2023 1025   CREATININE 5.31 (H) 08/26/2023 1025   CALCIUM 8.7 (L) 08/26/2023 1025   PROT 7.7 08/26/2023 1025   ALBUMIN 3.4 (L) 08/26/2023 1025   AST 14 (L) 08/26/2023 1025    ALT 22 08/26/2023 1025   ALKPHOS 83 08/26/2023 1025   BILITOT 0.8 08/26/2023 1025   GFRNONAA 9 (L) 08/26/2023 1025    Radiological Exams on Admission: VAS [place] LOWER EXTREMITY VENOUS (DVT) Result Date: 08/26/2023  Lower Venous DVT Study Patient Name:  Erin Davila  Date of Exam:   08/26/2023 Medical Rec #: [id number]         Accession #:    [id number] Date of Birth: [birth year]          Patient Gender: F Patient Age:   33 years Exam Location:  [facility] Procedure:      VAS [place] LOWER EXTREMITY VENOUS (DVT) Referring Phys: Jamelle Rushing --------------------------------------------------------------------------------  Indications: Swelling.  Risk Factors: None identified. Limitations: Body habitus and poor ultrasound/tissue interface. Comparison Study: No prior studies. Performing Technologist: Chanda Busing RVT  Examination Guidelines: A complete evaluation includes B-mode imaging, spectral Doppler, color Doppler, and power Doppler as needed of all accessible portions of each vessel. Bilateral testing is considered an integral part of a complete examination. Limited examinations for reoccurring indications may be performed as noted. The reflux portion of the exam is performed with the patient in reverse Trendelenburg.  +---------+---------------+---------+-----------+----------+--------------+ RIGHT    CompressibilityPhasicitySpontaneityPropertiesThrombus Aging +---------+---------------+---------+-----------+----------+--------------+ CFV      Full           Yes      Yes                                 +---------+---------------+---------+-----------+----------+--------------+ SFJ      Full                                                        +---------+---------------+---------+-----------+----------+--------------+ FV Prox  Full                                                        +---------+---------------+---------+-----------+----------+--------------+ FV  Mid   Full                                                        +---------+---------------+---------+-----------+----------+--------------+  FV DistalFull                                                        +---------+---------------+---------+-----------+----------+--------------+ PFV      Full                                                        +---------+---------------+---------+-----------+----------+--------------+ POP      Full           Yes      Yes                                 +---------+---------------+---------+-----------+----------+--------------+ PTV      Full                                                        +---------+---------------+---------+-----------+----------+--------------+ PERO     Full                                                        +---------+---------------+---------+-----------+----------+--------------+   +---------+---------------+---------+-----------+----------+--------------+ LEFT     CompressibilityPhasicitySpontaneityPropertiesThrombus Aging +---------+---------------+---------+-----------+----------+--------------+ CFV      Full           Yes      Yes                                 +---------+---------------+---------+-----------+----------+--------------+ SFJ      Full                                                        +---------+---------------+---------+-----------+----------+--------------+ FV Prox  Full                                                        +---------+---------------+---------+-----------+----------+--------------+ FV Mid   Full                                                        +---------+---------------+---------+-----------+----------+--------------+ FV DistalFull                                                        +---------+---------------+---------+-----------+----------+--------------+  PFV      Full                                                         +---------+---------------+---------+-----------+----------+--------------+ POP      Full           Yes      Yes                                 +---------+---------------+---------+-----------+----------+--------------+ PTV      Full                                                        +---------+---------------+---------+-----------+----------+--------------+ PERO     Full                                                        +---------+---------------+---------+-----------+----------+--------------+    Summary: RIGHT: - There is no evidence of deep vein thrombosis in the lower extremity. However, portions of this examination were limited- see technologist comments above.  - No cystic structure found in the popliteal fossa.  LEFT: - There is no evidence of deep vein thrombosis in the lower extremity. However, portions of this examination were limited- see technologist comments above.  - No cystic structure found in the popliteal fossa.  *See table(s) above for measurements and observations.    Preliminary    DG Chest 2 View Result Date: 08/26/2023 CLINICAL DATA:  Shortness of breath.  History of stage IV CKD. EXAM: CHEST - 2 VIEW COMPARISON:  None Available. FINDINGS: The cardiac silhouette is enlarged. Central pulmonary vascular congestion. Limited evaluation of the lateral view due to suboptimal positioning and overlying soft tissues. No appreciable focal consolidation, sizeable pleural effusion, or pneumothorax. No acute osseous abnormality identified. IMPRESSION: Cardiomegaly with central pulmonary vascular congestion. Electronically Signed   By: Hart Robinsons M.D.   On: 08/26/2023 10:38    EKG: Independently reviewed. NSR  DVT prophylaxis: heparin injection 5,000 Units Start: 08/26/23 1415   Code Status: full  Family Communication: friend at bedside  Disposition Plan: admit to med-surg  Consults called: nephrology    Leeroy Bock,  DO [facility]  08/26/2023, 3:04 PM    To contact the appropriate [facility] Attending or Consulting provider: Check [email or web address] for coverage from 7pm-7am

## 2023-08-26 NOTE — ED Notes (Signed)
Pt gone for xray

## 2023-08-26 NOTE — ED Notes (Signed)
 Patient transported to X-ray

## 2023-08-26 NOTE — Plan of Care (Addendum)
Patient is alert and oriented X4, says she walks independently at home. She is on 3L [place] and bed alarm on and functioning. MD notified of negative respiratory panel and awaiting orders.No complains of nausea or pain atm. Admission questions completed.  Problem: Education: Goal: Knowledge of General Education information will improve Description: Including pain rating scale, medication(s)/side effects and non-pharmacologic comfort measures Outcome: Progressing   Problem: Health Behavior/Discharge Planning: Goal: Ability to manage health-related needs will improve Outcome: Progressing   Problem: Clinical Measurements: Goal: Ability to maintain clinical measurements within normal limits will improve Outcome: Progressing Goal: Will remain free from infection Outcome: Progressing Goal: Diagnostic test results will improve Outcome: Progressing Goal: Respiratory complications will improve Outcome: Progressing Goal: Cardiovascular complication will be avoided Outcome: Progressing   Problem: Activity: Goal: Risk for activity intolerance will decrease Outcome: Progressing   Problem: Nutrition: Goal: Adequate nutrition will be maintained Outcome: Progressing   Problem: Coping: Goal: Level of anxiety will decrease Outcome: Progressing   Problem: Elimination: Goal: Will not experience complications related to bowel motility Outcome: Progressing Goal: Will not experience complications related to urinary retention Outcome: Progressing   Problem: Pain Managment: Goal: General experience of comfort will improve and/or be controlled Outcome: Progressing   Problem: Safety: Goal: Ability to remain free from injury will improve Outcome: Progressing   Problem: Skin Integrity: Goal: Risk for impaired skin integrity will decrease Outcome: Progressing

## 2023-08-26 NOTE — ED Provider Notes (Signed)
[place] EMERGENCY DEPARTMENT AT [facility] Provider Note   CSN: [id number] Arrival date & time: 08/26/23  4098     History  Chief Complaint  Patient presents with   Shortness of Breath    Erin Davila is a 63 y.o. female.   Shortness of Breath  Patient is a 63 year old female with past medical history significant for HTN, obesity, hyperglycemia/diabetes, anemia  Patient presents emergency room today with complaints of shortness of breath for the past 2 or 3 weeks.  She denies any chest pain or hemoptysis.  She states her dyspnea is worse with walking.  Still present at rest and is also making difficult for her to breathe at night.  No fevers lightheadedness or dizziness.  No recent surgeries, hospitalization, long travel, hemoptysis, estrogen containing OCP, cancer history.  No unilateral leg swelling.  No history of PE or VTE.      Home Medications Prior to Admission medications   Medication Sig Start Date End Date Taking? Authorizing Provider  acetaminophen (TYLENOL) 500 MG tablet Take 500-1,000 mg by mouth every 6 (six) hours as needed for moderate pain (pain score 4-6).    [provider]  amLODipine (NORVASC) 10 MG tablet Take 10 mg by mouth daily.    [provider]  calcitRIOL (ROCALTROL) 0.25 MCG capsule Take 0.25 mcg by mouth daily. Take with 0.5 mcg dose to equal 0.75 mcg once daily 03/21/23   [provider]  calcitRIOL (ROCALTROL) 0.5 MCG capsule Take 0.5 mcg by mouth daily. Take with 0.25 mcg dose to equal 0.75 mcg once daily    [provider]  carvedilol (COREG) 25 MG tablet Take 25 mg by mouth 2 (two) times daily. 02/05/23   [provider]  chlorthalidone (HYGROTON) 25 MG tablet Take 25 mg by mouth daily. 03/01/23   [provider]  ferrous sulfate 325 (65 FE) MG tablet Take 325 mg by mouth 3 (three) times a week.    [provider]  hydrALAZINE (APRESOLINE) 50 MG tablet Take 50 mg by  mouth 2 (two) times daily. 03/05/23   [provider]  HYDROcodone-acetaminophen (NORCO) 5-325 MG tablet Take 1 tablet by mouth every 6 (six) hours as needed for moderate pain (pain score 4-6). 05/06/23   Emilie Rutter, PA-C  hyoscyamine (LEVSIN) 0.125 MG/5ML ELIX Take 10 mLs (0.25 mg total) by mouth every 6 (six) hours as needed for cramping (abdominal pain/discomfort). 01/21/21   Angiulli, Mcarthur Rossetti, PA-C  insulin glargine (LANTUS SOLOSTAR) 100 UNIT/ML Solostar Pen Inject 44 Units into the skin daily. Patient taking differently: Inject 38 Units into the skin at bedtime. 01/21/21   Angiulli, Mcarthur Rossetti, PA-C  montelukast (SINGULAIR) 10 MG tablet Take 1 tablet (10 mg total) by mouth at bedtime. 01/21/21   Angiulli, Mcarthur Rossetti, PA-C  Multiple Vitamin (MULTIVITAMIN WITH MINERALS) TABS tablet Take 1 tablet by mouth daily.    [provider]  OZEMPIC, 2 MG/DOSE, 8 MG/3ML SOPN Inject 2 mg into the skin every Monday. 03/30/23   [provider]  pantoprazole (PROTONIX) 40 MG tablet Take 1 tablet (40 mg total) by mouth daily. 01/21/21   Angiulli, Mcarthur Rossetti, PA-C  simvastatin (ZOCOR) 40 MG tablet Take 1 tablet (40 mg total) by mouth at bedtime. 01/21/21   Angiulli, Mcarthur Rossetti, PA-C      Allergies    Lactose    Review of Systems   Review of Systems  Respiratory:  Positive for shortness of breath.  Physical Exam Updated Vital Signs BP (!) 155/65   Pulse 71   Temp 97.9 F (36.6 C) (Oral)   Resp (!) 24   Ht 5\' 8"  (1.727 m)   Wt (!) 181 kg   LMP 03/26/2015   SpO2 94%   BMI 60.67 kg/m  Physical Exam Vitals and nursing note reviewed.  Constitutional:      General: She is not in acute distress.    Appearance: She is obese.     Comments: Morbidly obese 63 year old female able to answer questions appropriately and follow commands dyspnea evident however  HENT:     Head: Normocephalic and atraumatic.     Nose: Nose normal.     Mouth/Throat:     Mouth: Mucous membranes are  moist.  Eyes:     General: No scleral icterus. Cardiovascular:     Rate and Rhythm: Normal rate and regular rhythm.     Pulses: Normal pulses.     Heart sounds: Normal heart sounds.  Pulmonary:     Effort: Pulmonary effort is normal. No respiratory distress.     Breath sounds: No wheezing.     Comments: Mildly tachypneic with mildly increased work of breathing speaking in full sentences but is evidently dyspneic Abdominal:     Palpations: Abdomen is soft.     Tenderness: There is no abdominal tenderness. There is no guarding or rebound.  Musculoskeletal:     Cervical back: Normal range of motion.     Right lower leg: Edema present.     Left lower leg: Edema present.     Comments: Symmetric 1+ lower extremity edema  Skin:    General: Skin is warm and dry.     Capillary Refill: Capillary refill takes less than 2 seconds.  Neurological:     Mental Status: She is alert. Mental status is at baseline.  Psychiatric:        Mood and Affect: Mood normal.        Behavior: Behavior normal.     ED Results / Procedures / Treatments   Labs (all labs ordered are listed, but only abnormal results are displayed) Labs Reviewed  D-DIMER, QUANTITATIVE - Abnormal; Notable for the following components:      Result Value   D-Dimer, Quant 0.88 (*)    All other components within normal limits  CBC - Abnormal; Notable for the following components:   RBC 2.74 (*)    Hemoglobin 8.6 (*)    HCT 29.8 (*)    MCV 108.8 (*)    MCHC 28.9 (*)    RDW 15.8 (*)    nRBC 0.4 (*)    All other components within normal limits  COMPREHENSIVE METABOLIC PANEL - Abnormal; Notable for the following components:   Chloride 115 (*)    CO2 20 (*)    Glucose, Bld 131 (*)    BUN 57 (*)    Creatinine, Ser 5.31 (*)    Calcium 8.7 (*)    Albumin 3.4 (*)    AST 14 (*)    GFR, Estimated 9 (*)    All other components within normal limits  BRAIN NATRIURETIC PEPTIDE - Abnormal; Notable for the following components:   B  Natriuretic Peptide 452.5 (*)    All other components within normal limits  RESP PANEL BY RT-PCR (RSV, FLU A&B, COVID)  RVPGX2  HIV ANTIBODY (ROUTINE TESTING W REFLEX)  CBC  CREATININE, SERUM  TROPONIN I (HIGH SENSITIVITY)  TROPONIN I (HIGH SENSITIVITY)  EKG EKG Interpretation Date/Time:  Wednesday August 26 2023 09:02:31 EST Ventricular Rate:  74 PR Interval:  165 QRS Duration:  98 QT Interval:  398 QTC Calculation: 442 R Axis:   22  Text Interpretation: Sinus rhythm Low voltage, extremity and precordial leads No significant change since last tracing Confirmed by Elayne Snare ([id number]) on 08/26/2023 9:18:19 AM  Radiology VAS [place] LOWER EXTREMITY VENOUS (DVT) Result Date: 08/26/2023  Lower Venous DVT Study Patient Name:  Erin Davila  Date of Exam:   08/26/2023 Medical Rec #: [id number]         Accession #:    [id number] Date of Birth: [birth year]          Patient Gender: F Patient Age:   60 years Exam Location:  [facility] Procedure:      VAS [place] LOWER EXTREMITY VENOUS (DVT) Referring Phys: Jamelle Rushing --------------------------------------------------------------------------------  Indications: Swelling.  Risk Factors: None identified. Limitations: Body habitus and poor ultrasound/tissue interface. Comparison Study: No prior studies. Performing Technologist: Chanda Busing RVT  Examination Guidelines: A complete evaluation includes B-mode imaging, spectral Doppler, color Doppler, and power Doppler as needed of all accessible portions of each vessel. Bilateral testing is considered an integral part of a complete examination. Limited examinations for reoccurring indications may be performed as noted. The reflux portion of the exam is performed with the patient in reverse Trendelenburg.  +---------+---------------+---------+-----------+----------+--------------+ RIGHT    CompressibilityPhasicitySpontaneityPropertiesThrombus Aging  +---------+---------------+---------+-----------+----------+--------------+ CFV      Full           Yes      Yes                                 +---------+---------------+---------+-----------+----------+--------------+ SFJ      Full                                                        +---------+---------------+---------+-----------+----------+--------------+ FV Prox  Full                                                        +---------+---------------+---------+-----------+----------+--------------+ FV Mid   Full                                                        +---------+---------------+---------+-----------+----------+--------------+ FV DistalFull                                                        +---------+---------------+---------+-----------+----------+--------------+ PFV      Full                                                        +---------+---------------+---------+-----------+----------+--------------+  POP      Full           Yes      Yes                                 +---------+---------------+---------+-----------+----------+--------------+ PTV      Full                                                        +---------+---------------+---------+-----------+----------+--------------+ PERO     Full                                                        +---------+---------------+---------+-----------+----------+--------------+   +---------+---------------+---------+-----------+----------+--------------+ LEFT     CompressibilityPhasicitySpontaneityPropertiesThrombus Aging +---------+---------------+---------+-----------+----------+--------------+ CFV      Full           Yes      Yes                                 +---------+---------------+---------+-----------+----------+--------------+ SFJ      Full                                                         +---------+---------------+---------+-----------+----------+--------------+ FV Prox  Full                                                        +---------+---------------+---------+-----------+----------+--------------+ FV Mid   Full                                                        +---------+---------------+---------+-----------+----------+--------------+ FV DistalFull                                                        +---------+---------------+---------+-----------+----------+--------------+ PFV      Full                                                        +---------+---------------+---------+-----------+----------+--------------+ POP      Full           Yes      Yes                                 +---------+---------------+---------+-----------+----------+--------------+  PTV      Full                                                        +---------+---------------+---------+-----------+----------+--------------+ PERO     Full                                                        +---------+---------------+---------+-----------+----------+--------------+    Summary: RIGHT: - There is no evidence of deep vein thrombosis in the lower extremity. However, portions of this examination were limited- see technologist comments above.  - No cystic structure found in the popliteal fossa.  LEFT: - There is no evidence of deep vein thrombosis in the lower extremity. However, portions of this examination were limited- see technologist comments above.  - No cystic structure found in the popliteal fossa.  *See table(s) above for measurements and observations.    Preliminary    DG Chest 2 View Result Date: 08/26/2023 CLINICAL DATA:  Shortness of breath.  History of stage IV CKD. EXAM: CHEST - 2 VIEW COMPARISON:  None Available. FINDINGS: The cardiac silhouette is enlarged. Central pulmonary vascular congestion. Limited evaluation of the lateral view due to  suboptimal positioning and overlying soft tissues. No appreciable focal consolidation, sizeable pleural effusion, or pneumothorax. No acute osseous abnormality identified. IMPRESSION: Cardiomegaly with central pulmonary vascular congestion. Electronically Signed   By: Hart Robinsons M.D.   On: 08/26/2023 10:38    Procedures .Critical Care  Performed by: Gailen Shelter, PA Authorized by: Gailen Shelter, PA   Critical care provider statement:    Critical care time (minutes):  35   Critical care time was exclusive of:  Separately billable procedures and treating other patients and teaching time   Critical care was necessary to treat or prevent imminent or life-threatening deterioration of the following conditions:  Respiratory failure   Critical care was time spent personally by me on the following activities:  Development of treatment plan with patient or surrogate, review of old charts, re-evaluation of patient's condition, pulse oximetry, ordering and review of radiographic studies, ordering and review of laboratory studies, ordering and performing treatments and interventions, obtaining history from patient or surrogate, examination of patient and evaluation of patient's response to treatment   Care discussed with: admitting provider       Medications Ordered in ED Medications  acetaminophen (TYLENOL) tablet 650 mg (has no administration in time range)    Or  acetaminophen (TYLENOL) suppository 650 mg (has no administration in time range)  polyethylene glycol (MIRALAX / GLYCOLAX) packet 17 g (17 g Oral Given 08/26/23 1443)  bisacodyl (DULCOLAX) EC tablet 5 mg (has no administration in time range)  heparin injection 5,000 Units (5,000 Units Subcutaneous Given 08/26/23 1442)  ipratropium-albuterol (DUONEB) 0.5-2.5 (3) MG/3ML nebulizer solution 3 mL (3 mLs Nebulization Given 08/26/23 1443)  ipratropium-albuterol (DUONEB) 0.5-2.5 (3) MG/3ML nebulizer solution 3 mL (3 mLs Nebulization Given  08/26/23 1016)  budesonide (PULMICORT) nebulizer solution 0.5 mg (0.5 mg Nebulization Given 08/26/23 1443)  furosemide (LASIX) injection 60 mg (60 mg Intravenous Given 08/26/23 1442)    ED Course/ Medical Decision Making/ A&P Clinical Course as of  08/26/23 1506  Wed Aug 26, 2023  0925 Oct - fistula placed. Nov  [WF]    Clinical Course User Index [WF] Gailen Shelter, [place]                                 Medical Decision Making Amount and/or Complexity of Data Reviewed Labs: ordered. Radiology: ordered.  Risk Prescription drug management. Decision regarding hospitalization.   This patient presents to the ED for concern of SOB, this involves a number of treatment options, and is a complaint that carries with it a moderate to high risk of complications and morbidity. A differential diagnosis was considered for the patient's symptoms which is discussed below:   The causes for shortness of breath include but are not limited to Cardiac (AHF, pericardial effusion and tamponade, arrhythmias, ischemia, etc) Respiratory (COPD, asthma, pneumonia, pneumothorax, primary pulmonary hypertension, PE/VQ mismatch) Hematological (anemia) Neuromuscular (ALS, Guillain-Barr, etc)    Co morbidities: Discussed in HPI   Brief History:  Patient is a 63 year old female with past medical history significant for HTN, obesity, hyperglycemia/diabetes, anemia  Patient presents emergency room today with complaints of shortness of breath for the past 2 or 3 weeks.  She denies any chest pain or hemoptysis.  She states her dyspnea is worse with walking.  Still present at rest and is also making difficult for her to breathe at night.  No fevers lightheadedness or dizziness.  No recent surgeries, hospitalization, long travel, hemoptysis, estrogen containing OCP, cancer history.  No unilateral leg swelling.  No history of PE or VTE.     EMR reviewed including pt PMHx, past surgical history and past visits to  ER.   See HPI for more details   Lab Tests:  Patient with anemia which appears consistent.    Imaging Studies:  Chest x-ray personally viewed by me.  Radiology read pulmonary vascular congestion    Cardiac Monitoring:  The patient was maintained on a cardiac monitor.  I personally viewed and interpreted the cardiac monitored which showed an underlying rhythm of: NSR EKG non-ischemic   Medicines ordered:  I ordered medication including Lasix, DuoNeb for dyspnea Reevaluation of the patient after these medicines showed that the patient improved I have reviewed the patients home medicines and have made adjustments as needed   Critical Interventions:     Consults/Attending Physician   I discussed this case with my attending physician who cosigned this note including patient's presenting symptoms, physical exam, and planned diagnostics and interventions. Attending physician stated agreement with plan or made changes to plan which were implemented.   Reevaluation:  After the interventions noted above I re-evaluated patient and found that they have :improved   Social Determinants of Health:      Problem List / ED Course:  Hypoxemia and dyspnea in setting of likely new onset heart failure.  I have a lower concern for pulmonary embolism and will admit patient to the hospital for dyspnea increased work of breathing she feels improved symptomatically but did desaturate to 86% on room air when ambulated.   Dispostion:  After consideration of the diagnostic results and the patients response to treatment, I feel that the patent would benefit from admission  Final Clinical Impression(s) / ED Diagnoses Final diagnoses:  Hypoxia  Dyspnea, unspecified type    Rx / DC Orders ED Discharge Orders     None         Solon Augusta  S, PA 08/26/23 1506    Elayne Snare K, DO 08/26/23 1511

## 2023-08-26 NOTE — ED Notes (Signed)
Pt ambulated to bathroom, noted to be dyspneic with labored breathing and difficulty talking. Upon arrival back to room, oxygen saturation 86%, pt slow to recover breath.

## 2023-08-27 ENCOUNTER — Inpatient Hospital Stay (HOSPITAL_COMMUNITY): Payer: 59

## 2023-08-27 DIAGNOSIS — R0902 Hypoxemia: Secondary | ICD-10-CM | POA: Diagnosis not present

## 2023-08-27 DIAGNOSIS — N185 Chronic kidney disease, stage 5: Secondary | ICD-10-CM

## 2023-08-27 DIAGNOSIS — I509 Heart failure, unspecified: Secondary | ICD-10-CM

## 2023-08-27 DIAGNOSIS — I5033 Acute on chronic diastolic (congestive) heart failure: Secondary | ICD-10-CM | POA: Diagnosis not present

## 2023-08-27 LAB — COMPREHENSIVE METABOLIC PANEL
ALT: 22 U/L (ref 0–44)
AST: 19 U/L (ref 15–41)
Albumin: 3.5 g/dL (ref 3.5–5.0)
Alkaline Phosphatase: 80 U/L (ref 38–126)
Anion gap: 11 (ref 5–15)
BUN: 57 mg/dL — ABNORMAL HIGH (ref 8–23)
CO2: 19 mmol/L — ABNORMAL LOW (ref 22–32)
Calcium: 8.8 mg/dL — ABNORMAL LOW (ref 8.9–10.3)
Chloride: 112 mmol/L — ABNORMAL HIGH (ref 98–111)
Creatinine, Ser: 6.11 mg/dL — ABNORMAL HIGH (ref 0.44–1.00)
GFR, Estimated: 7 mL/min — ABNORMAL LOW (ref >60)
Glucose, Bld: 113 mg/dL — ABNORMAL HIGH (ref 70–99)
Potassium: 5.1 mmol/L (ref 3.5–5.1)
Sodium: 142 mmol/L (ref 135–145)
Total Bilirubin: 0.5 mg/dL (ref 0.0–1.2)
Total Protein: 7.8 g/dL (ref 6.5–8.1)

## 2023-08-27 LAB — ECHOCARDIOGRAM COMPLETE
AR max vel: 1.84 cm2
AV Area VTI: 1.64 cm2
AV Area mean vel: 1.67 cm2
AV Mean grad: 11.2 mm[Hg]
AV Peak grad: 19.6 mm[Hg]
Ao pk vel: 2.21 m/s
Area-P 1/2: 3.15 cm2
Calc EF: 63.5 %
Height: 68 in
S' Lateral: 3.7 cm
Single Plane A2C EF: 65.3 %
Single Plane A4C EF: 61.5 %
Weight: 6400 [oz_av]

## 2023-08-27 LAB — CBC
HCT: 32.3 % — ABNORMAL LOW (ref 36.0–46.0)
Hemoglobin: 8.9 g/dL — ABNORMAL LOW (ref 12.0–15.0)
MCH: 32 pg (ref 26.0–34.0)
MCHC: 27.6 g/dL — ABNORMAL LOW (ref 30.0–36.0)
MCV: 116.2 fL — ABNORMAL HIGH (ref 80.0–100.0)
Platelets: 299 10*3/uL (ref 150–400)
RBC: 2.78 MIL/uL — ABNORMAL LOW (ref 3.87–5.11)
RDW: 15.8 % — ABNORMAL HIGH (ref 11.5–15.5)
WBC: 7.8 10*3/uL (ref 4.0–10.5)
nRBC: 1.4 % — ABNORMAL HIGH (ref 0.0–0.2)

## 2023-08-27 MED ORDER — FUROSEMIDE 10 MG/ML IJ SOLN
100.0000 mg | Freq: Three times a day (TID) | INTRAVENOUS | Status: DC
  Administered 2023-08-27 – 2023-08-28 (×3): 100 mg via INTRAVENOUS
  Filled 2023-08-27 (×5): qty 10

## 2023-08-27 MED ORDER — MONTELUKAST SODIUM 10 MG PO TABS
10.0000 mg | ORAL_TABLET | Freq: Every day | ORAL | Status: DC
  Administered 2023-08-27 – 2023-09-03 (×9): 10 mg via ORAL
  Filled 2023-08-27 (×8): qty 1

## 2023-08-27 MED ORDER — CHLORTHALIDONE 25 MG PO TABS
25.0000 mg | ORAL_TABLET | Freq: Every day | ORAL | Status: DC
  Administered 2023-08-27: 25 mg via ORAL
  Filled 2023-08-27 (×2): qty 1

## 2023-08-27 NOTE — Consult Note (Signed)
Renal Service Consult Note [facility]  Erin Davila 08/27/2023 Erin Krabbe, MD Requesting Physician: Dr. Dareen Davila  Reason for Consult: Renal failure  HPI: The patient is a 63 y.o. year-old w/ PMH as below who presented to ED c/o SOB for 2 wks. +DOE. Having some orthopnea as well at night. Has CKD stage IV. No chest pain. In ED pt desat'd w/ ambulation down to 86%.  CXR showed vasc congestion and CM. B{ 145 75, HR 79, RR 18, afebrile. Creat 5.5- 6.1 here (last 5.9 in oct 2024). Pt rec'd norvasc, pulmicort, IV lasix 60mg , hydralazine 50mg , duoneb, miralax and was admitted.    Pt seen in room. She is f/b Dr Erin Davila at [facility] and is seeing him every 3 months. They have talked about dialysis and she had LUA AVF created oct 2024. Needs superficialization by [facility] "2 months before starting HD" per their last note in December. She says she is not on lasix or torsemide, and has never had a problem w/ fluid build up like this before. She denies any N/V, loss of appetite, confusion, jerking at home.    PMH HL HTN DM2 Anemia    ROS - denies CP, no joint pain, no HA, no blurry vision, no rash, no diarrhea, no nausea/ vomiting, no dysuria, no difficulty voiding   Past Medical History  Past Medical History:  Diagnosis Date   Allergy    Anemia of chronic kidney failure    stage 5   Diabetes mellitus without complication (HCC)    type 2   GERD (gastroesophageal reflux disease)    History of iron deficiency anemia    Hyperlipidemia    Hypertension    Past Surgical History  Past Surgical History:  Procedure Laterality Date   AV FISTULA PLACEMENT Left 05/06/2023   Procedure: LEFT ARM RADIOCEPHALIC ARTERIOVENOUS (AV) FISTULA CREATION;  Surgeon: [name], MD;  Location: [facility] OR;  Service: Vascular;  Laterality: Left;   CESAREAN SECTION     x 1   INCISION AND DRAINAGE ABSCESS N/A 01/04/2021   Procedure: INCISION AND DRAINAGE LEFT BUTTOCK ABSCESS;  Surgeon: Luretha Murphy, MD;  Location: [facility] ORS;  Service: General;  Laterality: N/A;   Family History History reviewed. No pertinent family history. Social History  reports that she has never smoked. She has never used smokeless tobacco. She reports that she does not drink alcohol and does not use drugs. Allergies  Allergies  Allergen Reactions   Lactose Other (See Comments)    GI complaints   Home medications Prior to Admission medications   Medication Sig Start Date End Date Taking? Authorizing Provider  acetaminophen (TYLENOL) 500 MG tablet Take 500-1,000 mg by mouth every 6 (six) hours as needed for mild pain (pain score 1-3), moderate pain (pain score 4-6) or headache.   Yes [provider]  amLODipine (NORVASC) 10 MG tablet Take 10 mg by mouth at bedtime.   Yes [provider]  calcitRIOL (ROCALTROL) 0.25 MCG capsule Take 0.25 mcg by mouth daily. 03/21/23  Yes [provider]  calcitRIOL (ROCALTROL) 0.5 MCG capsule Take 0.5 mcg by mouth daily.   Yes [provider]  carvedilol (COREG) 6.25 MG tablet Take 6.25 mg by mouth 2 (two) times daily with a meal.   Yes [provider]  chlorthalidone (HYGROTON) 25 MG tablet Take 25 mg by mouth daily. 03/01/23  Yes [provider]  ferrous sulfate 325 (65 FE) MG tablet Take 325 mg by mouth 3 (  three) times a week.   Yes [provider]  hydrALAZINE (APRESOLINE) 50 MG tablet Take 50 mg by mouth in the morning and at bedtime. 03/05/23  Yes [provider]  hyoscyamine (LEVSIN) 0.125 MG/5ML ELIX Take 10 mLs (0.25 mg total) by mouth every 6 (six) hours as needed for cramping (abdominal pain/discomfort). 01/21/21  Yes Angiulli, Mcarthur Rossetti, PA-C  insulin glargine (LANTUS SOLOSTAR) 100 UNIT/ML Solostar Pen Inject 44 Units into the skin daily. Patient taking differently: Inject 38 Units into the skin at bedtime. 01/21/21  Yes Angiulli, Mcarthur Rossetti, PA-C  montelukast (SINGULAIR) 10 MG tablet Take 1 tablet (10 mg  total) by mouth at bedtime. 01/21/21  Yes Angiulli, Mcarthur Rossetti, PA-C  Multiple Vitamin (MULTIVITAMIN WITH MINERALS) TABS tablet Take 1 tablet by mouth daily.   Yes [provider]  OZEMPIC, 2 MG/DOSE, 8 MG/3ML SOPN Inject 2 mg into the skin every Monday. 03/30/23  Yes [provider]  pantoprazole (PROTONIX) 40 MG tablet Take 1 tablet (40 mg total) by mouth daily. Patient taking differently: Take 40 mg by mouth daily after lunch. 01/21/21  Yes Angiulli, Mcarthur Rossetti, PA-C  simvastatin (ZOCOR) 40 MG tablet Take 1 tablet (40 mg total) by mouth at bedtime. 01/21/21  Yes Angiulli, Mcarthur Rossetti, PA-C  HYDROcodone-acetaminophen (NORCO) 5-325 MG tablet Take 1 tablet by mouth every 6 (six) hours as needed for moderate pain (pain score 4-6). Patient not taking: Reported on 08/26/2023 05/06/23   Emilie Rutter, PA-C     Vitals:   08/27/23 4098 08/27/23 0905 08/27/23 1313 08/27/23 1400  BP: (!) 144/72  (!) 145/73   Pulse: 79  80   Resp: 18  18   Temp: 98.1 F (36.7 C)  98.5 F (36.9 C)   TempSrc: Oral  Oral   SpO2: 99% 99% 94% 94%  Weight:      Height:       Exam Gen alert, no distress No rash, cyanosis or gangrene Sclera anicteric, throat clear  +bilat JVD Chest basilar crackels bilaterally RRR no MRG Abd soft ntnd no mass or ascites +bs GU defer MS no joint effusions or deformity Ext 2+ pitting pretib edema and 1+ bilat hip edema Neuro is alert, Ox 3 , nf, no asterixis  LUE AVF+bruit      Renal-related home meds: - rocaltrol 0.25 mcg every day - hygroton 25 every day - norvasc 10 hs - coreg 6.25 bid - hydralazine 50 tid - others: insulins, statin, norco, PPI, ozempic, singulair   Date   Creat  eGFR (ml/min) 2022   4.91 >> 3.21 10 >> 16 ml/min   05/06/23  5.90  7 ml/min 08/26/23  5.31, 5.45 9, 8 ml/min 08/27/23  6.11  7 ml/min  UA pending UNa, UCr pending Renal US pending CXR 2/19 - CM w/ vasc congestion    Na 142  K 5.1  CO2 19  bun 57  creat 6.11   Ca 8.8  alb  3.5  Hb 8.9   Assessment/ Plan: Volume overload/ acute hypoxic resp failure - 1st time w/ this condition, most likely her progressive CKD is causing fluid retention. CXR shows vasc congestion and exam shows volume overload. This is not on any loop diuretics at home. ECHO has been ordered also. Will start high-dose IV lasix 100 tid for now. Will follow.  CKD 5 - b/l creatinine is 5.9 from oct 2024. Will get levels from [facility] as well. Pt is not uremic so RRT is not indicated at this  time. Creatinine appears to be stable/ close to baseline. Pt had an AVF placed in October, needs superficialization per last note in 06/2023 (Dr Lenell Antu). Get UA, urine lytes. Will follow.  HTN - poorly cont'd, cont meds and diuresis to get volume down.  DM2       Vinson Moselle  MD [facility] 08/27/2023, 2:17 PM  Recent Labs  Lab 08/26/23 1025 08/26/23 1502 08/27/23 0601  HGB 8.6* 8.4* 8.9*  ALBUMIN 3.4*  --  3.5  CALCIUM 8.7*  --  8.8*  CREATININE 5.31* 5.45* 6.11*  K 4.6  --  5.1   Inpatient medications:  amLODipine  10 mg Oral QHS   atorvastatin  20 mg Oral QHS   calcitRIOL  0.5 mcg Oral Daily   carvedilol  6.25 mg Oral BID WC   chlorthalidone  25 mg Oral Daily   heparin  5,000 Units Subcutaneous Q8H   hydrALAZINE  50 mg Oral Q8H   influenza vac split trivalent PF  0.5 mL Intramuscular Tomorrow-1000   insulin glargine-yfgn  38 Units Subcutaneous Daily   montelukast  10 mg Oral QHS   polyethylene glycol  17 g Oral Daily    acetaminophen **OR** acetaminophen, bisacodyl

## 2023-08-27 NOTE — Progress Notes (Signed)
PROGRESS NOTE  JOSPHINE Davila    DOB: [birth year], 63 y.o.  AOZ:[id number]    Code Status: Full Code   DOA: 08/26/2023   LOS: 1   Brief hospital course  Erin Davila is a 63 y.o. 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 At baseline, they are independent with ADLs.   They presented from home to the ED on 08/26/2023 with SOB x a couple months intermittently but severely worsened today. She states that since October she has been working from home and more sedentary since then. She endorses lower extremity swelling during this time and thinks its due to sitting longer periods of time. She has intermittent shortness of breath which is worse with temperature changes so thinks the cold weather is what worsened it today. Does not use oxygen at baseline and doesn't recall using it or breathing treatments in the past. Denies coughing, rhinorrhea. Denies orthopnea.   In the ED, it was found that they had new oxygen requirement of 2L to maintain sats >90%. She's hypertensive up to 170s systolics but otherwise stable.  Significant findings included: D-dimer 0.88. troponin 8>8. Respiratory panel negative. BNP 452.5. metabolic panel significant for Cr 5.31. last one in system is from 2024 and was 5.9. WBC 7.0, hgb 8.6 Chest xray: cardiomegaly with central pulmonary vascular congestion.   They were initially treated with breathing treatment.    Patient was admitted to medicine service for further workup and management of hypoxia as outlined in detail below.  08/27/23 -echo completed. Nephrology seen. Stable on 4L  Assessment & Plan  Principal Problem:   Hypoxia  Acute hypoxic respiratory failure- no URVI symptoms, chest xray shows vascular congestion. BNP and d-dimer elevated. Negative DVT on LE doppler. RVP negative. Suspect CHF exacerbation despite no h/o. Emphysema unlikely, no smoking hx. PE possibility with sedentary lifestyle  increasing.  - judicial trial of IV lasix and careful monitoring of UOP and renal function - wean O2 as tolerated - breathing treatments - daily weights - strict I/O - fluid restrictions  Diastolic heart failure- EF 60-65%.  Left ventricular internal cavity moderately to severely dilated with mild left ventricular hypertrophy.  Diastolic parameters are indeterminate based off the echo read. -Continue IV Lasix and closely monitor renal function, weight, clinical volume status, UOP   HTN- better controlled. Systolics 140s.  - continue home amlodipine, carvedilol, hydralazine, chlorthalidone   CKD IV- fistula in place. Appears to have roughly stable Cr from baseline on presentation.  - follow Cr closely with lasix use - nephrology consulted - continue home calcitrol   Type II DM- takes ozempic at home. home 44 units daily continued   HLD- continue home simvastatin   GERD- continue home protonix  Body mass index is 60.82 kg/m.  VTE ppx: heparin injection 5,000 Units Start: 08/26/23 1415   Diet:     Diet   Diet renal with fluid restriction Fluid restriction: 1200 mL Fluid; Room service appropriate? Yes; Fluid consistency: Thin   Consultants: Nephrology   Subjective 08/27/23    Pt reports feeling well today   Objective   Vitals:   08/26/23 1840 08/26/23 2020 08/27/23 0202 08/27/23 0616  BP: (!) 161/73  (!) 135/55 (!) 144/72  Pulse: 70  74 79  Resp: 20  18 18   Temp: 97.6 F (36.4 C)  98.4 F (36.9 C) 98.1 F (36.7 C)  TempSrc: Oral  Oral Oral  SpO2: 98% 94%  94% 99%  Weight:      Height:       No intake or output data in the 24 hours ending 08/27/23 0838 Filed Weights   08/26/23 0902 08/26/23 1750  Weight: (!) 181 kg (!) 181.4 kg    Constitutional: NAD, calm, comfortable HEENT: lids and conjunctivae normal. MMM. Posterior pharynx clear of any exudate or lesions. Normal dentition.  Neck: normal, supple, no masses, no thyromegaly Respiratory: CTAB, no  wheezing, no crackles. Normal respiratory effort. No accessory muscle use.  Cardiovascular: RRR, no murmurs / rubs / gallops. Non-pitting extremity edema. 2+ pedal pulses. no clubbing / cyanosis.  Abdomen: soft, NT, ND, no masses or HSM palpated. Musculoskeletal: No joint deformity upper and lower extremities. Normal muscle tone.  Skin: dry, intact, normal color, normal temperature on exposed skin Neurologic: Alert and oriented x 3. Normal speech. Grossly non-focal exam. PERRL Psychiatric: Normal mood. Congruent affect.  Labs   I have personally reviewed the following labs and imaging studies CBC    Component Value Date/Time   WBC 7.8 08/27/2023 0601   RBC 2.78 (L) 08/27/2023 0601   HGB 8.9 (L) 08/27/2023 0601   HCT 32.3 (L) 08/27/2023 0601   PLT 299 08/27/2023 0601   MCV 116.2 (H) 08/27/2023 0601   MCH 32.0 08/27/2023 0601   MCHC 27.6 (L) 08/27/2023 0601   RDW 15.8 (H) 08/27/2023 0601   LYMPHSABS 1.8 01/20/2021 0722   MONOABS 0.9 01/20/2021 0722   EOSABS 0.5 01/20/2021 0722   BASOSABS 0.0 01/20/2021 0722      Latest Ref Rng & Units 08/27/2023    6:01 AM 08/26/2023    3:02 PM 08/26/2023   10:25 AM  BMP  Glucose 70 - 99 mg/dL 272   536   BUN 8 - 23 mg/dL 57   57   Creatinine 6.44 - 1.00 mg/dL 0.34  7.42  5.95   Sodium 135 - 145 mmol/L 142   144   Potassium 3.5 - 5.1 mmol/L 5.1   4.6   Chloride 98 - 111 mmol/L 112   115   CO2 22 - 32 mmol/L 19   20   Calcium 8.9 - 10.3 mg/dL 8.8   8.7     VAS [place] LOWER EXTREMITY VENOUS (DVT) Result Date: 08/26/2023  Lower Venous DVT Study Patient Name:  Erin Davila  Date of Exam:   08/26/2023 Medical Rec #: [id number]         Accession #:    [id number] Date of Birth: [birth year]          Patient Gender: F Patient Age:   56 years Exam Location:  [facility] Procedure:      VAS [place] LOWER EXTREMITY VENOUS (DVT) Referring Phys: Jamelle Rushing --------------------------------------------------------------------------------  Indications:  Swelling.  Risk Factors: None identified. Limitations: Body habitus and poor ultrasound/tissue interface. Comparison Study: No prior studies. Performing Technologist: Chanda Busing RVT  Examination Guidelines: A complete evaluation includes B-mode imaging, spectral Doppler, color Doppler, and power Doppler as needed of all accessible portions of each vessel. Bilateral testing is considered an integral part of a complete examination. Limited examinations for reoccurring indications may be performed as noted. The reflux portion of the exam is performed with the patient in reverse Trendelenburg.  +---------+---------------+---------+-----------+----------+--------------+ RIGHT    CompressibilityPhasicitySpontaneityPropertiesThrombus Aging +---------+---------------+---------+-----------+----------+--------------+ CFV      Full           Yes      Yes                                 +---------+---------------+---------+-----------+----------+--------------+  SFJ      Full                                                        +---------+---------------+---------+-----------+----------+--------------+ FV Prox  Full                                                        +---------+---------------+---------+-----------+----------+--------------+ FV Mid   Full                                                        +---------+---------------+---------+-----------+----------+--------------+ FV DistalFull                                                        +---------+---------------+---------+-----------+----------+--------------+ PFV      Full                                                        +---------+---------------+---------+-----------+----------+--------------+ POP      Full           Yes      Yes                                 +---------+---------------+---------+-----------+----------+--------------+ PTV      Full                                                         +---------+---------------+---------+-----------+----------+--------------+ PERO     Full                                                        +---------+---------------+---------+-----------+----------+--------------+   +---------+---------------+---------+-----------+----------+--------------+ LEFT     CompressibilityPhasicitySpontaneityPropertiesThrombus Aging +---------+---------------+---------+-----------+----------+--------------+ CFV      Full           Yes      Yes                                 +---------+---------------+---------+-----------+----------+--------------+ SFJ      Full                                                        +---------+---------------+---------+-----------+----------+--------------+  FV Prox  Full                                                        +---------+---------------+---------+-----------+----------+--------------+ FV Mid   Full                                                        +---------+---------------+---------+-----------+----------+--------------+ FV DistalFull                                                        +---------+---------------+---------+-----------+----------+--------------+ PFV      Full                                                        +---------+---------------+---------+-----------+----------+--------------+ POP      Full           Yes      Yes                                 +---------+---------------+---------+-----------+----------+--------------+ PTV      Full                                                        +---------+---------------+---------+-----------+----------+--------------+ PERO     Full                                                        +---------+---------------+---------+-----------+----------+--------------+     Summary: RIGHT: - There is no evidence of deep vein thrombosis in the lower extremity. However,  portions of this examination were limited- see technologist comments above.  - No cystic structure found in the popliteal fossa.  LEFT: - There is no evidence of deep vein thrombosis in the lower extremity. However, portions of this examination were limited- see technologist comments above.  - No cystic structure found in the popliteal fossa.  *See table(s) above for measurements and observations. Electronically signed by Coral Else MD on 08/26/2023 at 8:14:26 PM.    Final    DG Chest 2 View Result Date: 08/26/2023 CLINICAL DATA:  Shortness of breath.  History of stage IV CKD. EXAM: CHEST - 2 VIEW COMPARISON:  None Available. FINDINGS: The cardiac silhouette is enlarged. Central pulmonary vascular congestion. Limited evaluation of the lateral view due to suboptimal positioning and overlying soft tissues. No appreciable focal consolidation, sizeable pleural effusion, or pneumothorax. No acute osseous abnormality identified. IMPRESSION: Cardiomegaly with central pulmonary vascular congestion.  Electronically Signed   By: Hart Robinsons M.D.   On: 08/26/2023 10:38    Disposition Plan & Communication  Patient status: Inpatient  Admitted From: Home Planned disposition location: Home Anticipated discharge date: 2/22 pending oxygen wean  Family Communication: none at bedside    Author: Leeroy Bock, DO [facility] 08/27/2023, 8:38 AM   Available by Epic secure chat 7AM-7PM. If 7PM-7AM, please contact night-coverage.  [facility] contact information found on [email or web address].

## 2023-08-27 NOTE — Progress Notes (Signed)
  Echocardiogram 2D Echocardiogram has been performed.  Erin Davila 08/27/2023, 12:20 PM

## 2023-08-27 NOTE — Progress Notes (Signed)
Pt had Carvedilol and Lasix scheduled at 1600 and 1700. BP 169/78. Informed Dr Romie Jumper and she confirmed administration.

## 2023-08-27 NOTE — Plan of Care (Signed)

## 2023-08-28 ENCOUNTER — Inpatient Hospital Stay (HOSPITAL_COMMUNITY): Payer: 59

## 2023-08-28 DIAGNOSIS — I5033 Acute on chronic diastolic (congestive) heart failure: Secondary | ICD-10-CM | POA: Diagnosis not present

## 2023-08-28 DIAGNOSIS — N185 Chronic kidney disease, stage 5: Secondary | ICD-10-CM | POA: Diagnosis not present

## 2023-08-28 DIAGNOSIS — R0902 Hypoxemia: Secondary | ICD-10-CM | POA: Diagnosis not present

## 2023-08-28 LAB — URINALYSIS, ROUTINE W REFLEX MICROSCOPIC
Bilirubin Urine: NEGATIVE
Glucose, UA: NEGATIVE mg/dL
Ketones, ur: NEGATIVE mg/dL
Nitrite: NEGATIVE
Protein, ur: 30 mg/dL — AB
Specific Gravity, Urine: 1.01 (ref 1.005–1.030)
pH: 5 (ref 5.0–8.0)

## 2023-08-28 LAB — GLUCOSE, CAPILLARY
Glucose-Capillary: 89 mg/dL (ref 70–99)
Glucose-Capillary: 90 mg/dL (ref 70–99)

## 2023-08-28 LAB — CREATININE, URINE, RANDOM: Creatinine, Urine: 133 mg/dL

## 2023-08-28 LAB — RENAL FUNCTION PANEL
Albumin: 3.4 g/dL — ABNORMAL LOW (ref 3.5–5.0)
Anion gap: 11 (ref 5–15)
BUN: 66 mg/dL — ABNORMAL HIGH (ref 8–23)
CO2: 19 mmol/L — ABNORMAL LOW (ref 22–32)
Calcium: 8.5 mg/dL — ABNORMAL LOW (ref 8.9–10.3)
Chloride: 108 mmol/L (ref 98–111)
Creatinine, Ser: 6.58 mg/dL — ABNORMAL HIGH (ref 0.44–1.00)
GFR, Estimated: 7 mL/min — ABNORMAL LOW (ref >60)
Glucose, Bld: 98 mg/dL (ref 70–99)
Phosphorus: 5.7 mg/dL — ABNORMAL HIGH (ref 2.5–4.6)
Potassium: 5.1 mmol/L (ref 3.5–5.1)
Sodium: 138 mmol/L (ref 135–145)

## 2023-08-28 LAB — SODIUM, URINE, RANDOM: Sodium, Ur: 55 mmol/L

## 2023-08-28 MED ORDER — FUROSEMIDE 10 MG/ML IJ SOLN
160.0000 mg | Freq: Three times a day (TID) | INTRAVENOUS | Status: DC
  Administered 2023-08-28 – 2023-09-03 (×17): 160 mg via INTRAVENOUS
  Filled 2023-08-28 (×4): qty 10
  Filled 2023-08-28: qty 16
  Filled 2023-08-28: qty 10
  Filled 2023-08-28 (×5): qty 16
  Filled 2023-08-28 (×2): qty 10
  Filled 2023-08-28: qty 2
  Filled 2023-08-28 (×2): qty 16
  Filled 2023-08-28: qty 2
  Filled 2023-08-28 (×2): qty 16
  Filled 2023-08-28 (×2): qty 10
  Filled 2023-08-28: qty 2

## 2023-08-28 MED ORDER — METOLAZONE 5 MG PO TABS
5.0000 mg | ORAL_TABLET | Freq: Two times a day (BID) | ORAL | Status: DC
  Administered 2023-08-28 – 2023-09-01 (×8): 5 mg via ORAL
  Filled 2023-08-28 (×9): qty 1

## 2023-08-28 MED ORDER — METOLAZONE 2.5 MG PO TABS
2.5000 mg | ORAL_TABLET | Freq: Once | ORAL | Status: AC
Start: 2023-08-28 — End: 2023-08-28
  Administered 2023-08-28: 2.5 mg via ORAL
  Filled 2023-08-28: qty 1

## 2023-08-28 MED ORDER — CHLORTHALIDONE 25 MG PO TABS
50.0000 mg | ORAL_TABLET | Freq: Every day | ORAL | Status: DC
  Administered 2023-08-28 – 2023-08-29 (×2): 50 mg via ORAL
  Filled 2023-08-28 (×2): qty 2

## 2023-08-28 NOTE — Progress Notes (Addendum)
[facility] Progress Note  Subjective:  Seen in room BP's good, but only 400 cc UOP yest and 200 cc this am per purewick Will check bladder scan  Vitals:   08/27/23 1620 08/27/23 2051 08/28/23 0547 08/28/23 0858  BP: (!) 169/78 (!) 156/67 (!) 163/63 (!) 169/66  Pulse: 82 85 83 80  Resp:  18 18   Temp:  99 F (37.2 C) 99 F (37.2 C)   TempSrc:  Oral Oral   SpO2: 100% 98% 98% 97%  Weight:      Height:        Exam: Gen alert, no distress No rash, cyanosis or gangrene Sclera anicteric, throat clear  +bilat JVD Chest basilar crackles bilaterally RRR no MRG Abd soft ntnd no mass or ascites +bs GU defer MS no joint effusions or deformity Ext 2+ pitting pretib edema and 1+ bilat hip edema Neuro is alert, Ox 3 , nf, no asterixis  LUE AVF+bruit         Renal-related home meds: - rocaltrol 0.25 mcg every day - hygroton 25 every day - norvasc 10 hs - coreg 6.25 bid - hydralazine 50 tid - others: insulins, statin, norco, PPI, ozempic, singulair     Date                             Creat               eGFR (ml/min) 2022                            4.91 >> 3.21    10 >> 16 ml/min            05/06/23                      5.90                 7 ml/min 08/26/23                        5.31, 5.45        9, 8 ml/min 08/27/23                        6.11                 7 ml/min   UA - 30 prot, 11-20 rbc, 21-50 wbc, 0-5 epi UNa 55, UCr 133 CXR 2/19 - CM w/ vasc congestion   Echo - LVEF 60-65%, mild LVH, mod-severely dilated internal LV size. RV mild enlarged, RV fxn mildly reduced. No valve issues. Possible high output state given mitral and aortic valve velocities.     Today's lab --> creat 6.58 K+ 5.1  alb 3.4    Assessment/ Plan: Volume overload/ acute hypoxic resp failure - 1st time w/ volume overload like this. . CXR shows vasc congestion and exam shows volume overload. She is not on any loop diuretics at home. ECHO showed nl LV, mild RV failure, possible  high-output state. Not responding to IV lasix 100 tid, will ^ to 160 tid and add zaroxolyn 5 bid. Would recommend cardiology eval for ECHO findings of poss high-outflow state.  AKI on CKD 5 - b/l creatinine is 5.9 from oct 2024. Pt is not uremic so RRT is not indicated at this time. Creatinine mid  6's today and rising. UA w/ mild hematuria/ pyuria. Will get renal US and bladder scan. AKI possibly due to CHF. Increasing diuretics as above. Also recommend transfer to [facility] and consult CHF team for possible high-output. She may require dialysis as well if does not respond to diuretics.  HTN - poorly cont'd, cont meds and diuresis to get volume down.  HD access - AVF surgery done in October 2024, still needs superficialization per last note in 06/2023 (Dr Lenell Antu).           Vinson Moselle MD  [facility] 08/28/2023, 12:20 PM  Recent Labs  Lab 08/26/23 1502 08/27/23 0601 08/28/23 0555  HGB 8.4* 8.9*  --   ALBUMIN  --  3.5 3.4*  CALCIUM  --  8.8* 8.5*  PHOS  --   --  5.7*  CREATININE 5.45* 6.11* 6.58*  K  --  5.1 5.1   No results for input(s): "IRON", "TIBC", "FERRITIN" in the last 168 hours. Inpatient medications:  amLODipine  10 mg Oral QHS   atorvastatin  20 mg Oral QHS   calcitRIOL  0.5 mcg Oral Daily   carvedilol  6.25 mg Oral BID WC   chlorthalidone  50 mg Oral Daily   heparin  5,000 Units Subcutaneous Q8H   hydrALAZINE  50 mg Oral Q8H   influenza vac split trivalent PF  0.5 mL Intramuscular Tomorrow-1000   insulin glargine-yfgn  38 Units Subcutaneous Daily   metolazone  5 mg Oral BID   montelukast  10 mg Oral QHS   polyethylene glycol  17 g Oral Daily    furosemide     acetaminophen **OR** acetaminophen, bisacodyl

## 2023-08-28 NOTE — Plan of Care (Signed)

## 2023-08-28 NOTE — Progress Notes (Signed)
PROGRESS NOTE  Erin Davila    DOB: 07-23-1960, 63 y.o.  WUX:[id number]    Code Status: Full Code   DOA: 08/26/2023   LOS: 2   Brief hospital course  Erin Davila is a 63 y.o. 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 At baseline, they are independent with ADLs.   They presented from home to the ED on 08/26/2023 with SOB x a couple months intermittently but severely worsened today. She states that since October she has been working from home and more sedentary since then. She endorses lower extremity swelling during this time and thinks its due to sitting longer periods of time. She has intermittent shortness of breath which is worse with temperature changes so thinks the cold weather is what worsened it today. Does not use oxygen at baseline and doesn't recall using it or breathing treatments in the past. Denies coughing, rhinorrhea. Denies orthopnea.   In the ED, it was found that they had new oxygen requirement of 2L to maintain sats >90%. She's hypertensive up to 170s systolics but otherwise stable.  Significant findings included: D-dimer 0.88. troponin 8>8. Respiratory panel negative. BNP 452.5. metabolic panel significant for Cr 5.31. last one in system is from 2024 and was 5.9. WBC 7.0, hgb 8.6 Chest xray: cardiomegaly with central pulmonary vascular congestion.   They were initially treated with breathing treatment.    Patient was admitted to medicine service for further workup and management of hypoxia as outlined in detail below.  08/28/23 -echo completed showing normal EF with possible diastolic dysfunction and hypertropia. Nephrology seen- recommending transfer to [facility]. Stable on 4L  Assessment & Plan  Principal Problem:   Hypoxia  Acute hypoxic respiratory failure- no URVI symptoms, chest xray shows vascular congestion. BNP and d-dimer elevated. Negative DVT on LE doppler. RVP negative. Suspect CHF  exacerbation despite no h/o. Emphysema unlikely, no smoking hx. PE possibility with sedentary lifestyle increasing but less likely and imaging difficulty with poor renal function.  - judicial trial of IV lasix and careful monitoring of UOP and renal function - wean O2 as tolerated - breathing treatments - daily weights - strict I/O - fluid restrictions  Diastolic heart failure- EF 60-65%.  Left ventricular internal cavity moderately to severely dilated with mild left ventricular hypertrophy.  Diastolic parameters are indeterminate based off the echo read. -Continue IV Lasix and closely monitor renal function, weight, clinical volume status, UOP - trial with metolazone as not having significant UOP. 400cc yesterday recorded but endorses some unmeasured output.  - nephrology recommends CHF team consult. Transferring to [facility]   HTN- better controlled. Systolics 140s.  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 Appears to have roughly stable Cr from baseline on presentation.  - follow Cr closely with lasix use - nephrology consulted - continue home calcitrol   Type II DM- takes ozempic at home.  - home 44 units daily continued at reduced dose of 38 - CBGs   HLD- continue home simvastatin   GERD- continue home protonix  Body mass index is 60.82 kg/m.  VTE ppx: heparin injection 5,000 Units Start: 08/26/23 1415  Diet:     Diet   Diet renal with fluid restriction Fluid restriction: 1200 mL Fluid; Room service appropriate? Yes; Fluid consistency: Thin   Consultants: Nephrology   Subjective 08/28/23    Pt reports feeling well today but  appears tired. She endorses at least one unmeasured void   Objective   Vitals:   08/27/23 1400 08/27/23 1620 08/27/23 2051 08/28/23 0547  BP:  (!) 169/78 (!) 156/67 (!) 163/63  Pulse:  82 85 83  Resp:   18 18  Temp:   99 F (37.2 C) 99 F (37.2 C)  TempSrc:   Oral Oral   SpO2: 94% 100% 98% 98%  Weight:      Height:        Intake/Output Summary (Last 24 hours) at 08/28/2023 0814 Last data filed at 08/27/2023 2245 Gross per 24 hour  Intake 840 ml  Output 400 ml  Net 440 ml   Filed Weights   08/26/23 0902 08/26/23 1750  Weight: (!) 181 kg (!) 181.4 kg    Constitutional: NAD, calm, comfortable. lethargic HEENT: lids and conjunctivae normal. MMM. Posterior pharynx clear of any exudate or lesions. Normal dentition.  Neck: normal, supple, no masses, no thyromegaly Respiratory: CTAB, no wheezing, no crackles. Normal respiratory effort. No accessory muscle use.  Cardiovascular: RRR, no murmurs / rubs / gallops. Non-pitting extremity edema. 2+ pedal pulses. no clubbing / cyanosis.  Abdomen: soft, NT, ND, no masses or HSM palpated. Musculoskeletal: No joint deformity upper and lower extremities. Normal muscle tone.  Skin: dry, intact, normal color, normal temperature on exposed skin Neurologic: Alert and oriented x 3. Normal speech. Grossly non-focal exam. PERRL Psychiatric: Normal mood. Congruent affect.  Labs   I have personally reviewed the following labs and imaging studies CBC    Component Value Date/Time   WBC 7.8 08/27/2023 0601   RBC 2.78 (L) 08/27/2023 0601   HGB 8.9 (L) 08/27/2023 0601   HCT 32.3 (L) 08/27/2023 0601   PLT 299 08/27/2023 0601   MCV 116.2 (H) 08/27/2023 0601   MCH 32.0 08/27/2023 0601   MCHC 27.6 (L) 08/27/2023 0601   RDW 15.8 (H) 08/27/2023 0601   LYMPHSABS 1.8 01/20/2021 0722   MONOABS 0.9 01/20/2021 0722   EOSABS 0.5 01/20/2021 0722   BASOSABS 0.0 01/20/2021 0722      Latest Ref Rng & Units 08/28/2023    5:55 AM 08/27/2023    6:01 AM 08/26/2023    3:02 PM  BMP  Glucose 70 - 99 mg/dL 98  161    BUN 8 - 23 mg/dL 66  57    Creatinine 0.96 - 1.00 mg/dL 0.45  4.09  8.11   Sodium 135 - 145 mmol/L 138  142    Potassium 3.5 - 5.1 mmol/L 5.1  5.1    Chloride 98 - 111 mmol/L 108  112    CO2 22 - 32 mmol/L 19  19     Calcium 8.9 - 10.3 mg/dL 8.5  8.8      ECHOCARDIOGRAM COMPLETE Result Date: 08/27/2023    ECHOCARDIOGRAM REPORT   Patient Name:   Erin Davila Date of Exam: 08/27/2023 Medical Rec #:  [id number]        Height:       68.0 in Accession #:    [id number]       Weight:       400.0 lb Date of Birth:  15-Sep-1960         BSA:          2.744 m Patient Age:    63 years         BP:           144/72 mmHg Patient Gender: F  HR:           80 bpm. Exam Location:  Inpatient Procedure: 2D Echo, Cardiac Doppler and Color Doppler (Both Spectral and Color            Flow Doppler were utilized during procedure). Indications:    I50.40* Unspecified combined systolic (congestive) and diastolic                 (congestive) heart failure  History:        Patient has no prior history of Echocardiogram examinations.                 Signs/Symptoms:Shortness of Breath and Dyspnea; Risk                 Factors:Diabetes and Hypertension.  Sonographer:    Sheralyn Boatman RDCS Referring Phys: [id number] Leeroy Bock  Sonographer Comments: Technically difficult study due to poor echo windows, suboptimal parasternal window, suboptimal apical window and suboptimal subcostal window. Image acquisition challenging due to patient body habitus. No IV access per patient, stated she only has port. IMPRESSIONS  1. Left ventricular ejection fraction, by estimation, is 60 to 65%. The left ventricle has normal function. Left ventricular endocardial border not optimally defined to evaluate regional wall motion. The left ventricular internal cavity size was moderately to severely dilated. There is mild left ventricular hypertrophy. Left ventricular diastolic parameters are indeterminate.  2. Right ventricular systolic function is mildly reduced. The right ventricular size is mildly enlarged. There is normal pulmonary artery systolic pressure. The estimated right ventricular systolic pressure is 34.6 mmHg.  3. The mitral valve is degenerative.  Trivial mitral valve regurgitation. No evidence of mitral stenosis.  4. The aortic valve is tricuspid. There is mild thickening of the aortic valve. Aortic valve regurgitation is trivial. Aortic valve sclerosis is present, with no evidence of aortic valve stenosis.  5. The inferior vena cava is dilated in size with >50% respiratory variability, suggesting right atrial pressure of 8 mmHg.  6. Increased flow velocities may be secondary to anemia, thyrotoxicosis, hyperdynamic or high flow state, suspect high output state given elevated mitral and aortic valve velocities. FINDINGS  Left Ventricle: Left ventricular ejection fraction, by estimation, is 60 to 65%. The left ventricle has normal function. Left ventricular endocardial border not optimally defined to evaluate regional wall motion. The left ventricular internal cavity size was moderately to severely dilated. There is mild left ventricular hypertrophy. Left ventricular diastolic parameters are indeterminate. Right Ventricle: The right ventricular size is mildly enlarged. Right vetricular wall thickness was not well visualized. Right ventricular systolic function is mildly reduced. There is normal pulmonary artery systolic pressure. The tricuspid regurgitant velocity is 2.58 m/s, and with an assumed right atrial pressure of 8 mmHg, the estimated right ventricular systolic pressure is 34.6 mmHg. Left Atrium: Left atrial size was normal in size. Right Atrium: Right atrial size was not well visualized. Pericardium: There is no evidence of pericardial effusion. Mitral Valve: The mitral valve is degenerative in appearance. Mild to moderate mitral annular calcification. Trivial mitral valve regurgitation. No evidence of mitral valve stenosis. The mean mitral valve gradient is 4.7 mmHg with average heart rate of 0  bpm. Tricuspid Valve: The tricuspid valve is grossly normal. Tricuspid valve regurgitation is mild . No evidence of tricuspid stenosis. Aortic Valve: The  aortic valve is tricuspid. There is mild thickening of the aortic valve. Aortic valve regurgitation is trivial. Aortic valve sclerosis is present, with no evidence of aortic valve  stenosis. Aortic valve mean gradient measures 11.2 mmHg.  Aortic valve peak gradient measures 19.6 mmHg. Aortic valve area, by VTI measures 1.64 cm. Pulmonic Valve: The pulmonic valve was grossly normal. Pulmonic valve regurgitation is trivial. Aorta: The aortic root and ascending aorta are structurally normal, with no evidence of dilitation. Venous: The inferior vena cava is dilated in size with greater than 50% respiratory variability, suggesting right atrial pressure of 8 mmHg. IAS/Shunts: No atrial level shunt detected by color flow Doppler. Additional Comments: 3D imaging was not performed. There is pleural effusion in the left lateral region.  LEFT VENTRICLE PLAX 2D LVIDd:         5.00 cm      Diastology LVIDs:         3.70 cm      LV e' medial:    8.59 cm/s LV PW:         1.20 cm      LV E/e' medial:  14.4 LV IVS:        1.10 cm      LV e' lateral:   4.35 cm/s LVOT diam:     2.20 cm      LV E/e' lateral: 28.4 LV SV:         70 LV SV Index:   25 LVOT Area:     3.80 cm  LV Volumes (MOD) LV vol d, MOD A2C: 146.0 ml LV vol d, MOD A4C: 134.5 ml LV vol s, MOD A2C: 50.6 ml LV vol s, MOD A4C: 51.8 ml LV SV MOD A2C:     95.4 ml LV SV MOD A4C:     134.5 ml LV SV MOD BP:      89.8 ml RIGHT VENTRICLE            IVC RV S prime:     8.81 cm/s  IVC diam: 2.20 cm TAPSE (M-mode): 2.4 cm LEFT ATRIUM            Index LA diam:      5.30 cm  1.93 cm/m LA Vol (A4C): 107.0 ml 39.00 ml/m  AORTIC VALVE AV Area (Vmax):    1.84 cm AV Area (Vmean):   1.67 cm AV Area (VTI):     1.64 cm AV Vmax:           221.42 cm/s AV Vmean:          154.814 cm/s AV VTI:            0.424 m AV Peak Grad:      19.6 mmHg AV Mean Grad:      11.2 mmHg LVOT Vmax:         107.00 cm/s LVOT Vmean:        67.900 cm/s LVOT VTI:          0.183 m LVOT/AV VTI ratio: 0.43  AORTA Ao  Root diam: 3.00 cm Ao Asc diam:  3.30 cm MITRAL VALVE                TRICUSPID VALVE MV Area (PHT): 3.15 cm     TR Peak grad:   26.6 mmHg MV Mean grad:  4.7 mmHg     TR Vmax:        258.00 cm/s MV Decel Time: 241 msec MV E velocity: 123.50 cm/s  SHUNTS MV A velocity: 115.00 cm/s  Systemic VTI:  0.18 m MV E/A ratio:  1.07         Systemic Diam: 2.20 cm  Weston Brass MD Electronically signed by Weston Brass MD Signature Date/Time: 08/27/2023/1:41:29 PM    Final    VAS [place] LOWER EXTREMITY VENOUS (DVT) Result Date: 08/26/2023  Lower Venous DVT Study Patient Name:  Erin Davila  Date of Exam:   08/26/2023 Medical Rec #: [id number]         Accession #:    [id number] Date of Birth: 1960/11/25          Patient Gender: F Patient Age:   48 years Exam Location:  [facility] Procedure:      VAS [place] LOWER EXTREMITY VENOUS (DVT) Referring Phys: Jamelle Rushing --------------------------------------------------------------------------------  Indications: Swelling.  Risk Factors: None identified. Limitations: Body habitus and poor ultrasound/tissue interface. Comparison Study: No prior studies. Performing Technologist: Chanda Busing RVT  Examination Guidelines: A complete evaluation includes B-mode imaging, spectral Doppler, color Doppler, and power Doppler as needed of all accessible portions of each vessel. Bilateral testing is considered an integral part of a complete examination. Limited examinations for reoccurring indications may be performed as noted. The reflux portion of the exam is performed with the patient in reverse Trendelenburg.  +---------+---------------+---------+-----------+----------+--------------+ RIGHT    CompressibilityPhasicitySpontaneityPropertiesThrombus Aging +---------+---------------+---------+-----------+----------+--------------+ CFV      Full           Yes      Yes                                  +---------+---------------+---------+-----------+----------+--------------+ SFJ      Full                                                        +---------+---------------+---------+-----------+----------+--------------+ FV Prox  Full                                                        +---------+---------------+---------+-----------+----------+--------------+ FV Mid   Full                                                        +---------+---------------+---------+-----------+----------+--------------+ FV DistalFull                                                        +---------+---------------+---------+-----------+----------+--------------+ PFV      Full                                                        +---------+---------------+---------+-----------+----------+--------------+ POP      Full           Yes      Yes                                 +---------+---------------+---------+-----------+----------+--------------+  PTV      Full                                                        +---------+---------------+---------+-----------+----------+--------------+ PERO     Full                                                        +---------+---------------+---------+-----------+----------+--------------+   +---------+---------------+---------+-----------+----------+--------------+ LEFT     CompressibilityPhasicitySpontaneityPropertiesThrombus Aging +---------+---------------+---------+-----------+----------+--------------+ CFV      Full           Yes      Yes                                 +---------+---------------+---------+-----------+----------+--------------+ SFJ      Full                                                        +---------+---------------+---------+-----------+----------+--------------+ FV Prox  Full                                                         +---------+---------------+---------+-----------+----------+--------------+ FV Mid   Full                                                        +---------+---------------+---------+-----------+----------+--------------+ FV DistalFull                                                        +---------+---------------+---------+-----------+----------+--------------+ PFV      Full                                                        +---------+---------------+---------+-----------+----------+--------------+ POP      Full           Yes      Yes                                 +---------+---------------+---------+-----------+----------+--------------+ PTV      Full                                                        +---------+---------------+---------+-----------+----------+--------------+  PERO     Full                                                        +---------+---------------+---------+-----------+----------+--------------+     Summary: RIGHT: - There is no evidence of deep vein thrombosis in the lower extremity. However, portions of this examination were limited- see technologist comments above.  - No cystic structure found in the popliteal fossa.  LEFT: - There is no evidence of deep vein thrombosis in the lower extremity. However, portions of this examination were limited- see technologist comments above.  - No cystic structure found in the popliteal fossa.  *See table(s) above for measurements and observations. Electronically signed by Coral Else MD on 08/26/2023 at 8:14:26 PM.    Final    DG Chest 2 View Result Date: 08/26/2023 CLINICAL DATA:  Shortness of breath.  History of stage IV CKD. EXAM: CHEST - 2 VIEW COMPARISON:  None Available. FINDINGS: The cardiac silhouette is enlarged. Central pulmonary vascular congestion. Limited evaluation of the lateral view due to suboptimal positioning and overlying soft tissues. No appreciable focal consolidation,  sizeable pleural effusion, or pneumothorax. No acute osseous abnormality identified. IMPRESSION: Cardiomegaly with central pulmonary vascular congestion. Electronically Signed   By: Hart Robinsons M.D.   On: 08/26/2023 10:38    Disposition Plan & Communication  Patient status: Inpatient  Admitted From: Home Planned disposition location: Home Anticipated discharge date: 2/22 pending oxygen wean  Family Communication: none at bedside    Author: Leeroy Bock, DO [facility] 08/28/2023, 8:14 AM   Available by Epic secure chat 7AM-7PM. If 7PM-7AM, please contact night-coverage.  [facility] contact information found on [email or web address].

## 2023-08-28 NOTE — Progress Notes (Signed)
Mobility Specialist - Progress Note   08/28/23 1453  Mobility  Activity Dangled on edge of bed  Activity Response Tolerated well  Mobility Referral Yes  Mobility visit 1 Mobility  Mobility Specialist Start Time (ACUTE ONLY) 1414  Mobility Specialist Stop Time (ACUTE ONLY) 1451  Mobility Specialist Time Calculation (min) (ACUTE ONLY) 37 min   Pt received in bed declining mobility but agreeable to sit EOB. Once sitting EOB, pt requested assistance with ADLs. Pt tolerated sitting EOB for ~47min. No complaints during session. Pt to bed after session with all needs met.    [facility]

## 2023-08-28 NOTE — Plan of Care (Signed)

## 2023-08-28 NOTE — Progress Notes (Addendum)
Bladder scan done with 122cc  of urine Dr Molly Maduro made aware. Pt A+Ox4, Transferred to [facility]. Calynn RN, called , report given.

## 2023-08-28 NOTE — Plan of Care (Signed)

## 2023-08-29 DIAGNOSIS — R0902 Hypoxemia: Secondary | ICD-10-CM | POA: Diagnosis not present

## 2023-08-29 LAB — RENAL FUNCTION PANEL
Albumin: 3 g/dL — ABNORMAL LOW (ref 3.5–5.0)
Anion gap: 17 — ABNORMAL HIGH (ref 5–15)
BUN: 64 mg/dL — ABNORMAL HIGH (ref 8–23)
CO2: 19 mmol/L — ABNORMAL LOW (ref 22–32)
Calcium: 9.5 mg/dL (ref 8.9–10.3)
Chloride: 106 mmol/L (ref 98–111)
Creatinine, Ser: 6.86 mg/dL — ABNORMAL HIGH (ref 0.44–1.00)
GFR, Estimated: 6 mL/min — ABNORMAL LOW (ref >60)
Glucose, Bld: 78 mg/dL (ref 70–99)
Phosphorus: 5.9 mg/dL — ABNORMAL HIGH (ref 2.5–4.6)
Potassium: 5 mmol/L (ref 3.5–5.1)
Sodium: 142 mmol/L (ref 135–145)

## 2023-08-29 LAB — GLUCOSE, CAPILLARY
Glucose-Capillary: 63 mg/dL — ABNORMAL LOW (ref 70–99)
Glucose-Capillary: 68 mg/dL — ABNORMAL LOW (ref 70–99)
Glucose-Capillary: 90 mg/dL (ref 70–99)
Glucose-Capillary: 75 mg/dL (ref 70–99)
Glucose-Capillary: 89 mg/dL (ref 70–99)
Glucose-Capillary: 114 mg/dL — ABNORMAL HIGH (ref 70–99)

## 2023-08-29 LAB — HEMOGLOBIN A1C
Hgb A1c MFr Bld: 4.6 % — ABNORMAL LOW (ref 4.8–5.6)
Mean Plasma Glucose: 85.32 mg/dL

## 2023-08-29 LAB — TSH: TSH: 1.469 u[IU]/mL (ref 0.350–4.500)

## 2023-08-29 MED ORDER — INSULIN GLARGINE-YFGN 100 UNIT/ML ~~LOC~~ SOLN
30.0000 [IU] | Freq: Every day | SUBCUTANEOUS | Status: DC
  Administered 2023-08-30: 30 [IU] via SUBCUTANEOUS
  Filled 2023-08-29: qty 0.3

## 2023-08-29 MED ORDER — GLUCOSE 40 % PO GEL
1.0000 | ORAL | Status: AC
  Administered 2023-08-29: 31 g via ORAL
  Filled 2023-08-29: qty 1.21

## 2023-08-29 MED ORDER — INSULIN ASPART 100 UNIT/ML IJ SOLN
0.0000 [IU] | Freq: Three times a day (TID) | INTRAMUSCULAR | Status: DC
  Administered 2023-08-30: 3 [IU] via SUBCUTANEOUS

## 2023-08-29 MED ORDER — INSULIN ASPART 100 UNIT/ML IJ SOLN: 0.0000 [IU] | Freq: Every day | INTRAMUSCULAR | Status: DC

## 2023-08-29 NOTE — Consult Note (Addendum)
 Cardiology Consultation   Patient ID: Erin Davila MRN: [id number]; DOB: 1960-09-12  Admit date: 08/26/2023 Date of Consult: 08/29/2023  PCP:  [name], MD   [facility] Providers Cardiologist:  None     Patient Profile:   Erin Davila is a 63 y.o. 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 Benjamine Mola.  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 She presented to [facility] ED on 08/26/2023 complaining of shortness of breath.   Per chart review, patient reported the shortness of breath to be occurring over the past couple of months but became worse 2/19. Stating it was worse with walking, present at rest and making it difficult for her to sleep at night. She denied any chest pain, fevers, lightheadedness, dizziness. She reports working from home/being more sedentary since October 2024. She admits she's been experiencing some LE edema since becoming more sedentary. Does not use oxygen at baseline.   She has no recent surgeries, hospitalizations, long travel, hemoptysis, estrogen supplementation, cancer history, unilateral LE swelling, or history of DVT/PE. Patient did become hypoxic, dropping to 86% while ambulating on room air. Required 2 L oxygen to maintain > 90%.   Relevant ED workup includes: d-dimer 0.88, negative troponin x 2, negative respiratory panel, BNP 452, creatinine 5.31, WBC 7, hemoglobin 8.6, CXR showed cardiomegaly and central pulmonary vascular congestion. Negative DVT on LE doppler.   Patient was admitted to medicine service for further workup and management of hypoxia.  Patient was given trial of IV  lasix, ordered an updated echocardiogram, strict I/Os and fluid resistrictions. She was re-started on her home antihypertensives; amlodipine, carvedilol, hydralazine, chlorthalidone. Nephrology was consulted in the setting of CKD stage 4. Statin for HLD was continued. PPI for GERD was continued and placed on SSI for diabetes.   Updated echocardiogram on 2/21 showed LVEF 60-65%, mild LVH, mildly reduced RV function, mildly enlarged RV, degenerative mtiral valve, trivial MR, trivial AR, dilated IVC, increased flow velocities possibly secondary to anemia, thyrotoxicosis, hyperdynamic or high-flow state. Suspicious for high output state.   Patient did not respond to IV Lasix 100 mg TID, nephrology increased to 160 mg TID and added zaroxolyn 5 mg BID. Requested patient be moved to [facility] for further care and seen by cardiology in regards to echocardiogram findings and possible high-output state. Nephrology noted that patient may likely require dialysis if unresponsive to diuretics. Patient had AVF surgery in October 2024 but still needed superficialization and sidebranch ligation per last note in 06/2023 (Dr Lenell Antu).    Patient was transferred to [facility] 08/28/2023 afternoon.  After speaking with patient, she agrees with the history as stated above. She denies any current symptoms including shortness of breath. She states that she normally is able to produce urine at home but believes she is not able to while in the hospital because they have been restricting her fluids.  Past Medical History:  Diagnosis Date   Allergy    Anemia of chronic kidney failure    stage 5   Diabetes mellitus without complication (HCC)    type 2   GERD (gastroesophageal reflux disease)    History of  iron deficiency anemia    Hyperlipidemia    Hypertension    Past Surgical History:  Procedure Laterality Date   AV FISTULA PLACEMENT Left 05/06/2023   Procedure: LEFT ARM RADIOCEPHALIC ARTERIOVENOUS (AV) FISTULA  CREATION;  Surgeon: [name], MD;  Location: [facility] OR;  Service: Vascular;  Laterality: Left;   CESAREAN SECTION     x 1   INCISION AND DRAINAGE ABSCESS N/A 01/04/2021   Procedure: INCISION AND DRAINAGE LEFT BUTTOCK ABSCESS;  Surgeon: Luretha Murphy, MD;  Location: [facility] ORS;  Service: General;  Laterality: N/A;    Home Medications:  Prior to Admission medications   Medication Sig Start Date End Date Taking? Authorizing Provider  acetaminophen (TYLENOL) 500 MG tablet Take 500-1,000 mg by mouth every 6 (six) hours as needed for mild pain (pain score 1-3), moderate pain (pain score 4-6) or headache.   Yes [provider]  amLODipine (NORVASC) 10 MG tablet Take 10 mg by mouth at bedtime.   Yes [provider]  calcitRIOL (ROCALTROL) 0.25 MCG capsule Take 0.25 mcg by mouth daily. 03/21/23  Yes [provider]  calcitRIOL (ROCALTROL) 0.5 MCG capsule Take 0.5 mcg by mouth daily.   Yes [provider]  carvedilol (COREG) 6.25 MG tablet Take 6.25 mg by mouth 2 (two) times daily with a meal.   Yes [provider]  chlorthalidone (HYGROTON) 25 MG tablet Take 25 mg by mouth daily. 03/01/23  Yes [provider]  ferrous sulfate 325 (65 FE) MG tablet Take 325 mg by mouth 3 (three) times a week.   Yes [provider]  hydrALAZINE (APRESOLINE) 50 MG tablet Take 50 mg by mouth in the morning and at bedtime. 03/05/23  Yes [provider]  hyoscyamine (LEVSIN) 0.125 MG/5ML ELIX Take 10 mLs (0.25 mg total) by mouth every 6 (six) hours as needed for cramping (abdominal pain/discomfort). 01/21/21  Yes Angiulli, Mcarthur Rossetti, PA-C  insulin glargine (LANTUS SOLOSTAR) 100 UNIT/ML Solostar Pen Inject 44 Units into the skin daily. Patient taking differently: Inject 38 Units into the skin at bedtime. 01/21/21  Yes Angiulli, Mcarthur Rossetti, PA-C  montelukast (SINGULAIR) 10 MG tablet Take 1 tablet (10 mg total) by mouth at bedtime. 01/21/21  Yes Angiulli, Mcarthur Rossetti,  PA-C  Multiple Vitamin (MULTIVITAMIN WITH MINERALS) TABS tablet Take 1 tablet by mouth daily.   Yes [provider]  OZEMPIC, 2 MG/DOSE, 8 MG/3ML SOPN Inject 2 mg into the skin every Monday. 03/30/23  Yes [provider]  pantoprazole (PROTONIX) 40 MG tablet Take 1 tablet (40 mg total) by mouth daily. Patient taking differently: Take 40 mg by mouth daily after lunch. 01/21/21  Yes Angiulli, Mcarthur Rossetti, PA-C  simvastatin (ZOCOR) 40 MG tablet Take 1 tablet (40 mg total) by mouth at bedtime. 01/21/21  Yes Angiulli, Mcarthur Rossetti, PA-C  HYDROcodone-acetaminophen (NORCO) 5-325 MG tablet Take 1 tablet by mouth every 6 (six) hours as needed for moderate pain (pain score 4-6). Patient not taking: Reported on 08/26/2023 05/06/23   Emilie Rutter, PA-C   Inpatient Medications: Scheduled Meds:  amLODipine  10 mg Oral QHS   atorvastatin  20 mg Oral QHS   calcitRIOL  0.5 mcg Oral Daily   carvedilol  6.25 mg Oral BID WC   chlorthalidone  50 mg Oral Daily   heparin  5,000 Units Subcutaneous Q8H   hydrALAZINE  50 mg Oral Q8H   influenza vac split trivalent PF  0.5 mL Intramuscular Tomorrow-1000   insulin glargine-yfgn  38 Units  Subcutaneous Daily   metolazone  5 mg Oral BID   montelukast  10 mg Oral QHS   polyethylene glycol  17 g Oral Daily   Continuous Infusions:  furosemide 160 mg (08/29/23 0609)   PRN Meds: acetaminophen **OR** acetaminophen, bisacodyl  Allergies:    Allergies  Allergen Reactions   Lactose Other (See Comments)    GI complaints   Social History:   Social History   Socioeconomic History   Marital status: Single    Spouse name: Not on file   Number of children: Not on file   Years of education: Not on file   Highest education level: Not on file  Occupational History   Not on file  Tobacco Use   Smoking status: Never   Smokeless tobacco: Never  Vaping Use   Vaping status: Never Used  Substance and Sexual Activity   Alcohol use: No   Drug use: No    Sexual activity: Not Currently    Birth control/protection: Post-menopausal  Other Topics Concern   Not on file  Social History Narrative   Not on file   Social Drivers of Health   Financial Resource Strain: Not on file  Food Insecurity: No Food Insecurity (08/26/2023)   Hunger Vital Sign    Worried About Running Out of Food in the Last Year: Never true    Ran Out of Food in the Last Year: Never true  Transportation Needs: No Transportation Needs (08/26/2023)   PRAPARE - Administrator, Civil Service (Medical): No    Lack of Transportation (Non-Medical): No  Physical Activity: Not on file  Stress: Not on file  Social Connections: Not on file  Intimate Partner Violence: Not At Risk (08/26/2023)   Humiliation, Afraid, Rape, and Kick questionnaire    Fear of Current or Ex-Partner: No    Emotionally Abused: No    Physically Abused: No    Sexually Abused: No    Family History:   History reviewed. No pertinent family history.   ROS:  Please see the history of present illness.  All other ROS reviewed and negative.     Physical Exam/Data:   Vitals:   08/29/23 0311 08/29/23 0621 08/29/23 0750 08/29/23 0800  BP: (!) 141/53  (!) 161/60   Pulse: 78  78 78  Resp: 18  20   Temp: 98.5 F (36.9 C)  98.4 F (36.9 C)   TempSrc: Oral  Oral   SpO2: 97%  94% 97%  Weight:  (!) 195.3 kg    Height:        Intake/Output Summary (Last 24 hours) at 08/29/2023 1002 Last data filed at 08/29/2023 0745 Gross per 24 hour  Intake 569.69 ml  Output 1400 ml  Net -830.31 ml      08/29/2023    6:21 AM 08/28/2023    4:26 PM 08/26/2023    5:50 PM  Last 3 Weights  Weight (lbs) 430 lb 8 oz 431 lb 6.4 oz 400 lb  Weight (kg) 195.274 kg 195.682 kg 181.439 kg     Body mass index is 65.46 kg/m.  General:  Obese female, in no acute distress, om 3 L oxygen via [place] HEENT: normal Neck: unable to assess JVD due to body habitus Vascular: Distal pulses 2+ bilaterally Cardiac:  normal S1, S2;  RRR; no murmur  Lungs:  clear to auscultation bilaterally Abd: soft, nontender Ext: 2+ bilateral LE pitting edema, non-pitting edema of UE Musculoskeletal:  No deformities Skin: warm and dry  Neuro:  no focal abnormalities noted Psych:  Normal affect   EKG:  The EKG was personally reviewed and demonstrates:  sinus rhythm, HR 74 bpm, low voltage  Telemetry:  Telemetry was personally reviewed and demonstrates:  sinus rhythm, HR 70s  Relevant CV Studies: Echocardiogram 08/27/2023 IMPRESSIONS   1. Left ventricular ejection fraction, by estimation, is 60 to 65%. The  left ventricle has normal function. Left ventricular endocardial border  not optimally defined to evaluate regional wall motion. The left  ventricular internal cavity size was  moderately to severely dilated. There is mild left ventricular  hypertrophy. Left ventricular diastolic parameters are indeterminate.   2. Right ventricular systolic function is mildly reduced. The right  ventricular size is mildly enlarged. There is normal pulmonary artery  systolic pressure. The estimated right ventricular systolic pressure is  34.6 mmHg.   3. The mitral valve is degenerative. Trivial mitral valve regurgitation.  No evidence of mitral stenosis.   4. The aortic valve is tricuspid. There is mild thickening of the aortic  valve. Aortic valve regurgitation is trivial. Aortic valve sclerosis is  present, with no evidence of aortic valve stenosis.   5. The inferior vena cava is dilated in size with >50% respiratory  variability, suggesting right atrial pressure of 8 mmHg.   6. Increased flow velocities may be secondary to anemia, thyrotoxicosis,  hyperdynamic or high flow state, suspect high output state given elevated  mitral and aortic valve velocities.   Laboratory Data:  High Sensitivity Troponin:   Recent Labs  Lab 08/26/23 1025 08/26/23 1236  TROPONINIHS 8 8     Chemistry Recent Labs  Lab 08/27/23 0601 08/28/23 0555  08/29/23 0248  NA 142 138 142  K 5.1 5.1 5.0  CL 112* 108 106  CO2 19* 19* 19*  GLUCOSE 113* 98 78  BUN 57* 66* 64*  CREATININE 6.11* 6.58* 6.86*  CALCIUM 8.8* 8.5* 9.5  GFRNONAA 7* 7* 6*  ANIONGAP 11 11 17*    Recent Labs  Lab 08/26/23 1025 08/27/23 0601 08/28/23 0555 08/29/23 0248  PROT 7.7 7.8  --   --   ALBUMIN 3.4* 3.5 3.4* 3.0*  AST 14* 19  --   --   ALT 22 22  --   --   ALKPHOS 83 80  --   --   BILITOT 0.8 0.5  --   --    Lipids No results for input(s): "CHOL", "TRIG", "HDL", "LABVLDL", "LDLCALC", "CHOLHDL" in the last 168 hours.  Hematology Recent Labs  Lab 08/26/23 1025 08/26/23 1502 08/27/23 0601  WBC 7.0 6.6 7.8  RBC 2.74* 2.62* 2.78*  HGB 8.6* 8.4* 8.9*  HCT 29.8* 28.7* 32.3*  MCV 108.8* 109.5* 116.2*  MCH 31.4 32.1 32.0  MCHC 28.9* 29.3* 27.6*  RDW 15.8* 15.7* 15.8*  PLT 244 243 299   Thyroid No results for input(s): "TSH", "FREET4" in the last 168 hours.  BNP Recent Labs  Lab 08/26/23 1026  BNP 452.5*    DDimer  Recent Labs  Lab 08/26/23 1025  DDIMER 0.88*   Radiology/Studies:  US RENAL Result Date: 08/28/2023 CLINICAL DATA:  Acute renal failure superimposed on chronic kidney disease. EXAM: RENAL / URINARY TRACT ULTRASOUND COMPLETE COMPARISON:  Renal ultrasound 01/04/2021 FINDINGS: Right Kidney: Renal measurements: 10.1 x 5.1 x 4.6 cm = volume: 121 mL. Echogenicity within normal limits. No mass or hydronephrosis visualized. Left Kidney: Renal measurements: 10.6 x 4.6 x 5.7 cm = volume: 147 mL. Echogenicity within normal limits. No mass  or hydronephrosis visualized. Bladder: Appears normal for degree of bladder distention. Other: None. IMPRESSION: Normal renal ultrasound. Electronically Signed   By: Darliss Cheney M.D.   On: 08/28/2023 19:32   ECHOCARDIOGRAM COMPLETE Result Date: 08/27/2023    ECHOCARDIOGRAM REPORT   Patient Name:   Erin Davila Date of Exam: 08/27/2023 Medical Rec #:  [id number]        Height:       68.0 in Accession #:     [id number]       Weight:       400.0 lb Date of Birth:  1961/03/14         BSA:          2.744 m Patient Age:    63 years         BP:           144/72 mmHg Patient Gender: F                HR:           80 bpm. Exam Location:  Inpatient Procedure: 2D Echo, Cardiac Doppler and Color Doppler (Both Spectral and Color            Flow Doppler were utilized during procedure). Indications:    I50.40* Unspecified combined systolic (congestive) and diastolic                 (congestive) heart failure  History:        Patient has no prior history of Echocardiogram examinations.                 Signs/Symptoms:Shortness of Breath and Dyspnea; Risk                 Factors:Diabetes and Hypertension.  Sonographer:    Sheralyn Boatman RDCS Referring Phys: [id number] Leeroy Bock  Sonographer Comments: Technically difficult study due to poor echo windows, suboptimal parasternal window, suboptimal apical window and suboptimal subcostal window. Image acquisition challenging due to patient body habitus. No IV access per patient, stated she only has port. IMPRESSIONS  1. Left ventricular ejection fraction, by estimation, is 60 to 65%. The left ventricle has normal function. Left ventricular endocardial border not optimally defined to evaluate regional wall motion. The left ventricular internal cavity size was moderately to severely dilated. There is mild left ventricular hypertrophy. Left ventricular diastolic parameters are indeterminate.  2. Right ventricular systolic function is mildly reduced. The right ventricular size is mildly enlarged. There is normal pulmonary artery systolic pressure. The estimated right ventricular systolic pressure is 34.6 mmHg.  3. The mitral valve is degenerative. Trivial mitral valve regurgitation. No evidence of mitral stenosis.  4. The aortic valve is tricuspid. There is mild thickening of the aortic valve. Aortic valve regurgitation is trivial. Aortic valve sclerosis is present, with no evidence of aortic  valve stenosis.  5. The inferior vena cava is dilated in size with >50% respiratory variability, suggesting right atrial pressure of 8 mmHg.  6. Increased flow velocities may be secondary to anemia, thyrotoxicosis, hyperdynamic or high flow state, suspect high output state given elevated mitral and aortic valve velocities. FINDINGS  Left Ventricle: Left ventricular ejection fraction, by estimation, is 60 to 65%. The left ventricle has normal function. Left ventricular endocardial border not optimally defined to evaluate regional wall motion. The left ventricular internal cavity size was moderately to severely dilated. There is mild left ventricular hypertrophy. Left ventricular diastolic parameters are indeterminate. Right Ventricle: The right ventricular  size is mildly enlarged. Right vetricular wall thickness was not well visualized. Right ventricular systolic function is mildly reduced. There is normal pulmonary artery systolic pressure. The tricuspid regurgitant velocity is 2.58 m/s, and with an assumed right atrial pressure of 8 mmHg, the estimated right ventricular systolic pressure is 34.6 mmHg. Left Atrium: Left atrial size was normal in size. Right Atrium: Right atrial size was not well visualized. Pericardium: There is no evidence of pericardial effusion. Mitral Valve: The mitral valve is degenerative in appearance. Mild to moderate mitral annular calcification. Trivial mitral valve regurgitation. No evidence of mitral valve stenosis. The mean mitral valve gradient is 4.7 mmHg with average heart rate of 0  bpm. Tricuspid Valve: The tricuspid valve is grossly normal. Tricuspid valve regurgitation is mild . No evidence of tricuspid stenosis. Aortic Valve: The aortic valve is tricuspid. There is mild thickening of the aortic valve. Aortic valve regurgitation is trivial. Aortic valve sclerosis is present, with no evidence of aortic valve stenosis. Aortic valve mean gradient measures 11.2 mmHg.  Aortic valve  peak gradient measures 19.6 mmHg. Aortic valve area, by VTI measures 1.64 cm. Pulmonic Valve: The pulmonic valve was grossly normal. Pulmonic valve regurgitation is trivial. Aorta: The aortic root and ascending aorta are structurally normal, with no evidence of dilitation. Venous: The inferior vena cava is dilated in size with greater than 50% respiratory variability, suggesting right atrial pressure of 8 mmHg. IAS/Shunts: No atrial level shunt detected by color flow Doppler. Additional Comments: 3D imaging was not performed. There is pleural effusion in the left lateral region.  LEFT VENTRICLE PLAX 2D LVIDd:         5.00 cm      Diastology LVIDs:         3.70 cm      LV e' medial:    8.59 cm/s LV PW:         1.20 cm      LV E/e' medial:  14.4 LV IVS:        1.10 cm      LV e' lateral:   4.35 cm/s LVOT diam:     2.20 cm      LV E/e' lateral: 28.4 LV SV:         70 LV SV Index:   25 LVOT Area:     3.80 cm  LV Volumes (MOD) LV vol d, MOD A2C: 146.0 ml LV vol d, MOD A4C: 134.5 ml LV vol s, MOD A2C: 50.6 ml LV vol s, MOD A4C: 51.8 ml LV SV MOD A2C:     95.4 ml LV SV MOD A4C:     134.5 ml LV SV MOD BP:      89.8 ml RIGHT VENTRICLE            IVC RV S prime:     8.81 cm/s  IVC diam: 2.20 cm TAPSE (M-mode): 2.4 cm LEFT ATRIUM            Index LA diam:      5.30 cm  1.93 cm/m LA Vol (A4C): 107.0 ml 39.00 ml/m  AORTIC VALVE AV Area (Vmax):    1.84 cm AV Area (Vmean):   1.67 cm AV Area (VTI):     1.64 cm AV Vmax:           221.42 cm/s AV Vmean:          154.814 cm/s AV VTI:            0.424 m  AV Peak Grad:      19.6 mmHg AV Mean Grad:      11.2 mmHg LVOT Vmax:         107.00 cm/s LVOT Vmean:        67.900 cm/s LVOT VTI:          0.183 m LVOT/AV VTI ratio: 0.43  AORTA Ao Root diam: 3.00 cm Ao Asc diam:  3.30 cm MITRAL VALVE                TRICUSPID VALVE MV Area (PHT): 3.15 cm     TR Peak grad:   26.6 mmHg MV Mean grad:  4.7 mmHg     TR Vmax:        258.00 cm/s MV Decel Time: 241 msec MV E velocity: 123.50 cm/s  SHUNTS  MV A velocity: 115.00 cm/s  Systemic VTI:  0.18 m MV E/A ratio:  1.07         Systemic Diam: 2.20 cm Weston Brass MD Electronically signed by Weston Brass MD Signature Date/Time: 08/27/2023/1:41:29 PM    Final    VAS [place] LOWER EXTREMITY VENOUS (DVT) Result Date: 08/26/2023  Lower Venous DVT Study Patient Name:  SUELLYN MEENAN  Date of Exam:   08/26/2023 Medical Rec #: [id number]         Accession #:    [id number] Date of Birth: 05-31-1961          Patient Gender: F Patient Age:   63 years Exam Location:  [facility] Procedure:      VAS [place] LOWER EXTREMITY VENOUS (DVT) Referring Phys: Jamelle Rushing --------------------------------------------------------------------------------  Indications: Swelling.  Risk Factors: None identified. Limitations: Body habitus and poor ultrasound/tissue interface. Comparison Study: No prior studies. Performing Technologist: Chanda Busing RVT  Examination Guidelines: A complete evaluation includes B-mode imaging, spectral Doppler, color Doppler, and power Doppler as needed of all accessible portions of each vessel. Bilateral testing is considered an integral part of a complete examination. Limited examinations for reoccurring indications may be performed as noted. The reflux portion of the exam is performed with the patient in reverse Trendelenburg.  +---------+---------------+---------+-----------+----------+--------------+ RIGHT    CompressibilityPhasicitySpontaneityPropertiesThrombus Aging +---------+---------------+---------+-----------+----------+--------------+ CFV      Full           Yes      Yes                                 +---------+---------------+---------+-----------+----------+--------------+ SFJ      Full                                                        +---------+---------------+---------+-----------+----------+--------------+ FV Prox  Full                                                         +---------+---------------+---------+-----------+----------+--------------+ FV Mid   Full                                                        +---------+---------------+---------+-----------+----------+--------------+  FV DistalFull                                                        +---------+---------------+---------+-----------+----------+--------------+ PFV      Full                                                        +---------+---------------+---------+-----------+----------+--------------+ POP      Full           Yes      Yes                                 +---------+---------------+---------+-----------+----------+--------------+ PTV      Full                                                        +---------+---------------+---------+-----------+----------+--------------+ PERO     Full                                                        +---------+---------------+---------+-----------+----------+--------------+   +---------+---------------+---------+-----------+----------+--------------+ LEFT     CompressibilityPhasicitySpontaneityPropertiesThrombus Aging +---------+---------------+---------+-----------+----------+--------------+ CFV      Full           Yes      Yes                                 +---------+---------------+---------+-----------+----------+--------------+ SFJ      Full                                                        +---------+---------------+---------+-----------+----------+--------------+ FV Prox  Full                                                        +---------+---------------+---------+-----------+----------+--------------+ FV Mid   Full                                                        +---------+---------------+---------+-----------+----------+--------------+ FV DistalFull                                                         +---------+---------------+---------+-----------+----------+--------------+  PFV      Full                                                        +---------+---------------+---------+-----------+----------+--------------+ POP      Full           Yes      Yes                                 +---------+---------------+---------+-----------+----------+--------------+ PTV      Full                                                        +---------+---------------+---------+-----------+----------+--------------+ PERO     Full                                                        +---------+---------------+---------+-----------+----------+--------------+     Summary: RIGHT: - There is no evidence of deep vein thrombosis in the lower extremity. However, portions of this examination were limited- see technologist comments above.  - No cystic structure found in the popliteal fossa.  LEFT: - There is no evidence of deep vein thrombosis in the lower extremity. However, portions of this examination were limited- see technologist comments above.  - No cystic structure found in the popliteal fossa.  *See table(s) above for measurements and observations. Electronically signed by Coral Else MD on 08/26/2023 at 8:14:26 PM.    Final    DG Chest 2 View Result Date: 08/26/2023 CLINICAL DATA:  Shortness of breath.  History of stage IV CKD. EXAM: CHEST - 2 VIEW COMPARISON:  None Available. FINDINGS: The cardiac silhouette is enlarged. Central pulmonary vascular congestion. Limited evaluation of the lateral view due to suboptimal positioning and overlying soft tissues. No appreciable focal consolidation, sizeable pleural effusion, or pneumothorax. No acute osseous abnormality identified. IMPRESSION: Cardiomegaly with central pulmonary vascular congestion. Electronically Signed   By: Hart Robinsons M.D.   On: 08/26/2023 10:38   Assessment and Plan:   Volume overload AKI on CKD stage 4 Possible high  output state  Patient presented significantly volume overloaded on exam to ED 2/19 CXR showed cardiomegaly with central pulmonary vascular congestion  Echo showed EF 60%, mild RV failure, possible high-output state  Being seen by nephrology who is guiding diuresis, patient was non-responsive to IV Lasix 100 mg TID so they increased to 160 mg TID and added metolazone 5 mg BID Net -590 L this admission  BNP 452 If charted accurately, patient has gained 30 lbs from 2/19 to 2/22 Creatinine increased from 5.31 on admission to 6.86 today Currently on 3 L of oxygen via [place] Denies any current symptoms, no chest pain, shortness of breath Ordered TSH to rule out thyrotoxicosis  Difficult to truly assess volume status due to body habitus but patient seems to remain significantly fluid overloaded in both upper and lower extremities She reports improvement in her breathing, lungs sound  clear on exam Seems likely that patients high outflow state could be renally driven and patient will more than likely have be started on dialysis, if continuing to not respond to diuresis   Hypertension  Most recent BP 161/60 Continue amlodipine 10 mg daily Continue coreg 6.25 mg daily Continue hydralazine 50 mg TID Patient likely going to need dialysis for further management   Hyperlipidemia  Lipid panel from 08/2021 showed total 137, HDL 56, LDL 67, trigs 71  Continue Lipitor 20 mg daily   Elevated d-dimer D-dimer minimally elevated at 0.88  Patient did present with shortness of breath and did become hypoxic with ambulation in the ED, SpO2 dropping to 86% on room air. She denies any use of oxygen at baseline  Currently on 3 L of oxygen via [place] Patient was never tachycardic  Doppler LE U/S was negative for DVTs Defer to primary for further evaluation if necessary   Per primary Acute hypoxic respiratory failure CKD stage 4 Diabetes  Anemia   Risk Assessment/Risk Scores:     New York Heart Association (NYHA)  Functional Class NYHA Class III    For questions or updates, please contact [facility] Please consult [email or web address] for contact info under    Signed, Olena Leatherwood, PA-C  08/29/2023 10:02 AM  Cardiology Attending  Patient seen and examined. 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 Her creatinine has increased from 5 to almost 7. She has been treated with high dose IV lasix and metolazone. Her dyspnea is improved. She denies syncope. On exam she is massively obese, NAD. Lungs with scattered rales but no increased work of breathing. Cv with a  RRR and Ext with 1+ edema. Neuro is non-focal. Tele with NSR. Labs reviewed. A/P High output state by echo - this is an expected finding and is of no clinical significance at this point and is due to her fistula and other medical problems. Treat her volume overload as appropriate. She will very soon need HD.   Sharlot Gowda Norvin Ohlin,MD

## 2023-08-29 NOTE — Plan of Care (Signed)
  Problem: Education: Goal: Knowledge of General Education information will improve Description: Including pain rating scale, medication(s)/side effects and non-pharmacologic comfort measures Outcome: Progressing   Problem: Health Behavior/Discharge Planning: Goal: Ability to manage health-related needs will improve Outcome: Progressing   Problem: Clinical Measurements: Goal: Ability to maintain clinical measurements within normal limits will improve Outcome: Progressing Goal: Will remain free from infection Outcome: Progressing Goal: Diagnostic test results will improve Outcome: Progressing Goal: Respiratory complications will improve Outcome: Progressing Goal: Cardiovascular complication will be avoided Outcome: Progressing   Problem: Activity: Goal: Risk for activity intolerance will decrease Outcome: Progressing   Problem: Nutrition: Goal: Adequate nutrition will be maintained Outcome: Progressing   Problem: Coping: Goal: Level of anxiety will decrease Outcome: Progressing   Problem: Elimination: Goal: Will not experience complications related to bowel motility Outcome: Progressing Goal: Will not experience complications related to urinary retention Outcome: Progressing   Problem: Pain Managment: Goal: General experience of comfort will improve and/or be controlled Outcome: Progressing   Problem: Safety: Goal: Ability to remain free from injury will improve Outcome: Progressing   Problem: Skin Integrity: Goal: Risk for impaired skin integrity will decrease Outcome: Progressing   Problem: Education: Goal: Ability to describe self-care measures that may prevent or decrease complications (Diabetes Survival Skills Education) will improve Outcome: Progressing Goal: Individualized Educational Video(s) Outcome: Progressing   Problem: Fluid Volume: Goal: Ability to maintain a balanced intake and output will improve Outcome: Progressing   Problem:  Coping: Goal: Ability to adjust to condition or change in health will improve Outcome: Progressing   Problem: Health Behavior/Discharge Planning: Goal: Ability to identify and utilize available resources and services will improve Outcome: Progressing Goal: Ability to manage health-related needs will improve Outcome: Progressing   Problem: Skin Integrity: Goal: Risk for impaired skin integrity will decrease Outcome: Progressing   Problem: Tissue Perfusion: Goal: Adequacy of tissue perfusion will improve Outcome: Progressing

## 2023-08-29 NOTE — Progress Notes (Signed)
 [facility] Progress Note  Subjective:  Seen in room BP's good, better UOP 1 L yesterday Still swollen in the legs > arms  Vitals:   08/29/23 0311 08/29/23 0621 08/29/23 0750 08/29/23 0800  BP: (!) 141/53  (!) 161/60   Pulse: 78  78 78  Resp: 18  20   Temp: 98.5 F (36.9 C)  98.4 F (36.9 C)   TempSrc: Oral  Oral   SpO2: 97%  94% 97%  Weight:  (!) 195.3 kg    Height:        Exam: Gen alert, no distress +bilat JVD Chest basilar crackles bilaterally RRR no MRG Abd soft ntnd no mass or ascites +bs Ext 2+ pretib edema / 1+ bilat hip edema Neuro is alert, Ox 3 , nf, no asterixis  LUE AVF+bruit         Renal-related home meds: - rocaltrol 0.25 mcg every day - hygroton 25 every day - norvasc 10 hs - coreg 6.25 bid - hydralazine 50 tid - others: insulins, statin, norco, PPI, ozempic, singulair     Date                             Creat               eGFR (ml/min) 2022                            4.91 >> 3.21    10 >> 16 ml/min            05/06/23                      5.90                 7 ml/min 08/26/23                        5.31, 5.45        9, 8 ml/min 08/27/23                        6.11                 7 ml/min   UA - 30 prot, 11-20 rbc, 21-50 wbc, 0-5 epi UNa 55, UCr 133 CXR 2/19 - CM w/ vasc congestion   Echo - LVEF 60-65%, mild LVH, mod-severely dilated internal LV size. RV mild enlarged, RV fxn mildly reduced. No valve issues. Possible high output state given mitral and aortic valve velocities.     Today's lab --> creat 6.58 K+ 5.1  alb 3.4    Assessment/ Plan: AKI on CKD 5 - b/l creatinine is 5.9 from oct 2024. Creat here 5.4 on admission in the setting of new-onset vol overload w/ pulm edema. UA w/ mild hematuria/ pyuria, [place] w/o obstruction. AKI due to CHF vs progression of CKD. Did not respond to 100 lasix IV tid, now getting 160mg  tid IV and po metolazone 5 bid. Creat rising w/ diuretics. Cardiology consulting since ECHO showed she may have  "high-output" and she had her AV fistula placed last October 2024- question if the two are related? Not uremic, cont max po/ IV diuretics. Will follow.  Volume overload/ acute hypoxic resp failure - 1st time w/ volume overload like this. . CXR shows vasc congestion and exam shows  bilat UE/ LE edema. She was not on any loop diuretics at home. ECHO showed nl LV, mild RV failure, possible high-output state. Getting max IV lasix now and po metolazone, UOP improving slowly. Recommend cardiology consult for ECHO findings of poss high-outflow state.  HTN - poorly cont'd, cont meds and diuresis to get volume down.  HD access - AVF surgery done in October 2024, still needs superficialization per last note in 06/2023 (Dr Lenell Antu).           Vinson Moselle MD  [facility] 08/29/2023, 10:11 AM  Recent Labs  Lab 08/26/23 1502 08/27/23 0601 08/28/23 0555 08/29/23 0248  HGB 8.4* 8.9*  --   --   ALBUMIN  --  3.5 3.4* 3.0*  CALCIUM  --  8.8* 8.5* 9.5  PHOS  --   --  5.7* 5.9*  CREATININE 5.45* 6.11* 6.58* 6.86*  K  --  5.1 5.1 5.0   No results for input(s): "IRON", "TIBC", "FERRITIN" in the last 168 hours. Inpatient medications:  amLODipine  10 mg Oral QHS   atorvastatin  20 mg Oral QHS   calcitRIOL  0.5 mcg Oral Daily   carvedilol  6.25 mg Oral BID WC   chlorthalidone  50 mg Oral Daily   heparin  5,000 Units Subcutaneous Q8H   hydrALAZINE  50 mg Oral Q8H   influenza vac split trivalent PF  0.5 mL Intramuscular Tomorrow-1000   insulin glargine-yfgn  38 Units Subcutaneous Daily   metolazone  5 mg Oral BID   montelukast  10 mg Oral QHS   polyethylene glycol  17 g Oral Daily    furosemide 160 mg (08/29/23 0609)   acetaminophen **OR** acetaminophen, bisacodyl

## 2023-08-29 NOTE — Progress Notes (Signed)
 PROGRESS NOTE  Erin Davila    DOB: 1961/03/18, 63 y.o.  NFA:[id number]    Code Status: Full Code   DOA: 08/26/2023   LOS: 3   Brief hospital course  Erin Davila is a 63 y.o. 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 At baseline, independent with ADLs.   Presented from home to the ED on 08/26/2023 with SOB x a couple months intermittently but severely worsened today. She states that since October she has been working from home and more sedentary since then. She endorses lower extremity swelling during this time and thinks its due to sitting longer periods of time. She has intermittent shortness of breath which is worse with temperature changes so thinks the cold weather is what worsened it today.     Admitted to [facility] and transferred to [facility] for cardiology evaluation.    Assessment & Plan  Principal Problem:   Hypoxia  Acute hypoxic respiratory failure-  -wean to RA  Diastolic heart failure- EF 60-65%.  Left ventricular internal cavity moderately to severely dilated with mild left ventricular hypertrophy.  Diastolic parameters are indeterminate based off the echo read. - cards consulted- defer meds to them -renal consulted   HTN-  -adjust meds   CKD IV- fistula in place. Appears to have roughly stable Cr from baseline on presentation.  - follow Cr closely with lasix use - nephrology consulted-- may need HD   Type II DM- takes ozempic at home.  -no SSI ordered -reduce lantus   HLD- continue home simvastatin   GERD- continue home protonix  obesity Body mass index is 65.46 kg/m.  VTE ppx: heparin injection 5,000 Units Start: 08/26/23 1415    Consultants: Nephrology   Subjective 08/29/23    Does not wear O2 at home   Objective   Vitals:   08/29/23 0311 08/29/23 0621 08/29/23 0750 08/29/23 0800  BP: (!) 141/53  (!) 161/60   Pulse: 78  78 78  Resp: 18  20   Temp: 98.5 F (36.9 C)  98.4 F  (36.9 C)   TempSrc: Oral  Oral   SpO2: 97%  94% 97%  Weight:  (!) 195.3 kg    Height:        Intake/Output Summary (Last 24 hours) at 08/29/2023 1131 Last data filed at 08/29/2023 0745 Gross per 24 hour  Intake 569.69 ml  Output 1400 ml  Net -830.31 ml   Filed Weights   08/26/23 1750 08/28/23 1626 08/29/23 0621  Weight: (!) 181.4 kg (!) 195.7 kg (!) 195.3 kg     General: Appearance:    Severely obese female in no acute distress     Lungs:    diminished, respirations unlabored  Heart:    Normal heart rate.    MS:   All extremities are intact.   Neurologic:   Awake, alert     Labs   I have personally reviewed the following labs and imaging studies CBC    Component Value Date/Time   WBC 7.8 08/27/2023 0601   RBC 2.78 (L) 08/27/2023 0601   HGB 8.9 (L) 08/27/2023 0601   HCT 32.3 (L) 08/27/2023 0601   PLT 299 08/27/2023 0601   MCV 116.2 (H) 08/27/2023 0601   MCH 32.0 08/27/2023 0601   MCHC 27.6 (L) 08/27/2023 0601   RDW 15.8 (H) 08/27/2023 0601   LYMPHSABS 1.8 01/20/2021 0722   MONOABS 0.9 01/20/2021 0722   EOSABS  0.5 01/20/2021 0722   BASOSABS 0.0 01/20/2021 0722      Latest Ref Rng & Units 08/29/2023    2:48 AM 08/28/2023    5:55 AM 08/27/2023    6:01 AM  BMP  Glucose 70 - 99 mg/dL 78  98  409   BUN 8 - 23 mg/dL 64  66  57   Creatinine 0.44 - 1.00 mg/dL 8.11  9.14  7.82   Sodium 135 - 145 mmol/L 142  138  142   Potassium 3.5 - 5.1 mmol/L 5.0  5.1  5.1   Chloride 98 - 111 mmol/L 106  108  112   CO2 22 - 32 mmol/L 19  19  19    Calcium 8.9 - 10.3 mg/dL 9.5  8.5  8.8     US RENAL Result Date: 08/28/2023 CLINICAL DATA:  Acute renal failure superimposed on chronic kidney disease. EXAM: RENAL / URINARY TRACT ULTRASOUND COMPLETE COMPARISON:  Renal ultrasound 01/04/2021 FINDINGS: Right Kidney: Renal measurements: 10.1 x 5.1 x 4.6 cm = volume: 121 mL. Echogenicity within normal limits. No mass or hydronephrosis visualized. Left Kidney: Renal measurements: 10.6 x 4.6 x 5.7  cm = volume: 147 mL. Echogenicity within normal limits. No mass or hydronephrosis visualized. Bladder: Appears normal for degree of bladder distention. Other: None. IMPRESSION: Normal renal ultrasound. Electronically Signed   By: Darliss Cheney M.D.   On: 08/28/2023 19:32   ECHOCARDIOGRAM COMPLETE Result Date: 08/27/2023    ECHOCARDIOGRAM REPORT   Patient Name:   Erin Davila Date of Exam: 08/27/2023 Medical Rec #:  [id number]        Height:       68.0 in Accession #:    [id number]       Weight:       400.0 lb Date of Birth:  10-02-1960         BSA:          2.744 m Patient Age:    63 years         BP:           144/72 mmHg Patient Gender: F                HR:           80 bpm. Exam Location:  Inpatient Procedure: 2D Echo, Cardiac Doppler and Color Doppler (Both Spectral and Color            Flow Doppler were utilized during procedure). Indications:    I50.40* Unspecified combined systolic (congestive) and diastolic                 (congestive) heart failure  History:        Patient has no prior history of Echocardiogram examinations.                 Signs/Symptoms:Shortness of Breath and Dyspnea; Risk                 Factors:Diabetes and Hypertension.  Sonographer:    Sheralyn Boatman RDCS Referring Phys: [id number] Leeroy Bock  Sonographer Comments: Technically difficult study due to poor echo windows, suboptimal parasternal window, suboptimal apical window and suboptimal subcostal window. Image acquisition challenging due to patient body habitus. No IV access per patient, stated she only has port. IMPRESSIONS  1. Left ventricular ejection fraction, by estimation, is 60 to 65%. The left ventricle has normal function. Left ventricular endocardial border not optimally defined to evaluate regional wall motion. The left  ventricular internal cavity size was moderately to severely dilated. There is mild left ventricular hypertrophy. Left ventricular diastolic parameters are indeterminate.  2. Right ventricular systolic  function is mildly reduced. The right ventricular size is mildly enlarged. There is normal pulmonary artery systolic pressure. The estimated right ventricular systolic pressure is 34.6 mmHg.  3. The mitral valve is degenerative. Trivial mitral valve regurgitation. No evidence of mitral stenosis.  4. The aortic valve is tricuspid. There is mild thickening of the aortic valve. Aortic valve regurgitation is trivial. Aortic valve sclerosis is present, with no evidence of aortic valve stenosis.  5. The inferior vena cava is dilated in size with >50% respiratory variability, suggesting right atrial pressure of 8 mmHg.  6. Increased flow velocities may be secondary to anemia, thyrotoxicosis, hyperdynamic or high flow state, suspect high output state given elevated mitral and aortic valve velocities. FINDINGS  Left Ventricle: Left ventricular ejection fraction, by estimation, is 60 to 65%. The left ventricle has normal function. Left ventricular endocardial border not optimally defined to evaluate regional wall motion. The left ventricular internal cavity size was moderately to severely dilated. There is mild left ventricular hypertrophy. Left ventricular diastolic parameters are indeterminate. Right Ventricle: The right ventricular size is mildly enlarged. Right vetricular wall thickness was not well visualized. Right ventricular systolic function is mildly reduced. There is normal pulmonary artery systolic pressure. The tricuspid regurgitant velocity is 2.58 m/s, and with an assumed right atrial pressure of 8 mmHg, the estimated right ventricular systolic pressure is 34.6 mmHg. Left Atrium: Left atrial size was normal in size. Right Atrium: Right atrial size was not well visualized. Pericardium: There is no evidence of pericardial effusion. Mitral Valve: The mitral valve is degenerative in appearance. Mild to moderate mitral annular calcification. Trivial mitral valve regurgitation. No evidence of mitral valve stenosis.  The mean mitral valve gradient is 4.7 mmHg with average heart rate of 0  bpm. Tricuspid Valve: The tricuspid valve is grossly normal. Tricuspid valve regurgitation is mild . No evidence of tricuspid stenosis. Aortic Valve: The aortic valve is tricuspid. There is mild thickening of the aortic valve. Aortic valve regurgitation is trivial. Aortic valve sclerosis is present, with no evidence of aortic valve stenosis. Aortic valve mean gradient measures 11.2 mmHg.  Aortic valve peak gradient measures 19.6 mmHg. Aortic valve area, by VTI measures 1.64 cm. Pulmonic Valve: The pulmonic valve was grossly normal. Pulmonic valve regurgitation is trivial. Aorta: The aortic root and ascending aorta are structurally normal, with no evidence of dilitation. Venous: The inferior vena cava is dilated in size with greater than 50% respiratory variability, suggesting right atrial pressure of 8 mmHg. IAS/Shunts: No atrial level shunt detected by color flow Doppler. Additional Comments: 3D imaging was not performed. There is pleural effusion in the left lateral region.  LEFT VENTRICLE PLAX 2D LVIDd:         5.00 cm      Diastology LVIDs:         3.70 cm      LV e' medial:    8.59 cm/s LV PW:         1.20 cm      LV E/e' medial:  14.4 LV IVS:        1.10 cm      LV e' lateral:   4.35 cm/s LVOT diam:     2.20 cm      LV E/e' lateral: 28.4 LV SV:         70 LV  SV Index:   25 LVOT Area:     3.80 cm  LV Volumes (MOD) LV vol d, MOD A2C: 146.0 ml LV vol d, MOD A4C: 134.5 ml LV vol s, MOD A2C: 50.6 ml LV vol s, MOD A4C: 51.8 ml LV SV MOD A2C:     95.4 ml LV SV MOD A4C:     134.5 ml LV SV MOD BP:      89.8 ml RIGHT VENTRICLE            IVC RV S prime:     8.81 cm/s  IVC diam: 2.20 cm TAPSE (M-mode): 2.4 cm LEFT ATRIUM            Index LA diam:      5.30 cm  1.93 cm/m LA Vol (A4C): 107.0 ml 39.00 ml/m  AORTIC VALVE AV Area (Vmax):    1.84 cm AV Area (Vmean):   1.67 cm AV Area (VTI):     1.64 cm AV Vmax:           221.42 cm/s AV Vmean:           154.814 cm/s AV VTI:            0.424 m AV Peak Grad:      19.6 mmHg AV Mean Grad:      11.2 mmHg LVOT Vmax:         107.00 cm/s LVOT Vmean:        67.900 cm/s LVOT VTI:          0.183 m LVOT/AV VTI ratio: 0.43  AORTA Ao Root diam: 3.00 cm Ao Asc diam:  3.30 cm MITRAL VALVE                TRICUSPID VALVE MV Area (PHT): 3.15 cm     TR Peak grad:   26.6 mmHg MV Mean grad:  4.7 mmHg     TR Vmax:        258.00 cm/s MV Decel Time: 241 msec MV E velocity: 123.50 cm/s  SHUNTS MV A velocity: 115.00 cm/s  Systemic VTI:  0.18 m MV E/A ratio:  1.07         Systemic Diam: 2.20 cm Weston Brass MD Electronically signed by Weston Brass MD Signature Date/Time: 08/27/2023/1:41:29 PM    Final     Disposition Plan & Communication  Patient status: Inpatient  Admitted From: Home Planned disposition location: Home   Family Communication: none at bedside    Author: Joseph Art, DO [facility] 08/29/2023, 11:31 AM   Available by Epic secure chat 7AM-7PM. If 7PM-7AM, please contact night-coverage.  [facility] contact information found on [email or web address].

## 2023-08-30 ENCOUNTER — Encounter (HOSPITAL_COMMUNITY): Payer: Self-pay | Admitting: Student in an Organized Health Care Education/Training Program

## 2023-08-30 DIAGNOSIS — R0902 Hypoxemia: Secondary | ICD-10-CM | POA: Diagnosis not present

## 2023-08-30 LAB — GLUCOSE, CAPILLARY
Glucose-Capillary: 71 mg/dL (ref 70–99)
Glucose-Capillary: 79 mg/dL (ref 70–99)
Glucose-Capillary: 132 mg/dL — ABNORMAL HIGH (ref 70–99)
Glucose-Capillary: 92 mg/dL (ref 70–99)

## 2023-08-30 LAB — RENAL FUNCTION PANEL
Albumin: 3 g/dL — ABNORMAL LOW (ref 3.5–5.0)
Anion gap: 13 (ref 5–15)
BUN: 70 mg/dL — ABNORMAL HIGH (ref 8–23)
CO2: 18 mmol/L — ABNORMAL LOW (ref 22–32)
Calcium: 8.8 mg/dL — ABNORMAL LOW (ref 8.9–10.3)
Chloride: 106 mmol/L (ref 98–111)
Creatinine, Ser: 7.23 mg/dL — ABNORMAL HIGH (ref 0.44–1.00)
GFR, Estimated: 6 mL/min — ABNORMAL LOW (ref >60)
Glucose, Bld: 88 mg/dL (ref 70–99)
Phosphorus: 5.8 mg/dL — ABNORMAL HIGH (ref 2.5–4.6)
Potassium: 4.8 mmol/L (ref 3.5–5.1)
Sodium: 137 mmol/L (ref 135–145)

## 2023-08-30 LAB — CBC
HCT: 26.9 % — ABNORMAL LOW (ref 36.0–46.0)
Hemoglobin: 7.9 g/dL — ABNORMAL LOW (ref 12.0–15.0)
MCH: 31.9 pg (ref 26.0–34.0)
MCHC: 29.4 g/dL — ABNORMAL LOW (ref 30.0–36.0)
MCV: 108.5 fL — ABNORMAL HIGH (ref 80.0–100.0)
Platelets: 230 10*3/uL (ref 150–400)
RBC: 2.48 MIL/uL — ABNORMAL LOW (ref 3.87–5.11)
RDW: 15.4 % (ref 11.5–15.5)
WBC: 8.7 10*3/uL (ref 4.0–10.5)
nRBC: 0.6 % — ABNORMAL HIGH (ref 0.0–0.2)

## 2023-08-30 LAB — HEPATITIS B SURFACE ANTIGEN: Hepatitis B Surface Ag: NONREACTIVE

## 2023-08-30 MED ORDER — CHLORHEXIDINE GLUCONATE CLOTH 2 % EX PADS
6.0000 | MEDICATED_PAD | Freq: Every day | CUTANEOUS | Status: DC
  Administered 2023-08-30 – 2023-09-04 (×6): 6 via TOPICAL

## 2023-08-30 MED ORDER — INSULIN GLARGINE-YFGN 100 UNIT/ML ~~LOC~~ SOLN
20.0000 [IU] | Freq: Every day | SUBCUTANEOUS | Status: DC
  Filled 2023-08-30 (×2): qty 0.2

## 2023-08-30 NOTE — Progress Notes (Signed)
 PROGRESS NOTE  Erin Davila    DOB: [birth year], 63 y.o.  BJY:[id number]    Code Status: Full Code   DOA: 08/26/2023   LOS: 4   Brief hospital course  Erin Davila is a 63 y.o. 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 At baseline, independent with ADLs.   Presented from home to the ED on 08/26/2023 with SOB x a couple months intermittently but severely worsened today. She states that since October she has been working from home and more sedentary since then. She endorses lower extremity swelling during this time and thinks its due to sitting longer periods of time. She has intermittent shortness of breath which is worse with temperature changes so thinks the cold weather is what worsened it today.     Admitted to [facility] and transferred to [facility] for cardiology evaluation.    Assessment & Plan  Principal Problem:   Hypoxia   Acute hypoxic respiratory failure-  -wean to RA as able  Diastolic heart failure- EF 60-65%.  Left ventricular internal cavity moderately to severely dilated with mild left ventricular hypertrophy.  Diastolic parameters are indeterminate based off the echo read. 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 Appears to have roughly stable Cr from baseline on presentation.  - follow Cr closely with lasix use - nephrology consulted-- plan to start HD 2/24   Type II DM- takes ozempic at home.  -no SSI ordered -reduce lantus again   HLD- continue home simvastatin   GERD- continue home protonix  obesity Body mass index is 65.03 kg/m.  VTE ppx: heparin injection 5,000 Units Start: 08/26/23 1415    Consultants: Nephrology   Subjective 08/30/23    Agreeable to start HD in AM   Objective   Vitals:   08/30/23 0409 08/30/23 0537 08/30/23 0734 08/30/23 1113  BP: (!) 156/66  (!) 159/68 (!) 162/64  Pulse: 76   72 74  Resp: 17  18 20   Temp: 98.5 F (36.9 C)  98.8 F (37.1 C) 99 F (37.2 C)  TempSrc: Oral  Oral Oral  SpO2: 99%  94% 97%  Weight:  (!) 194 kg    Height:        Intake/Output Summary (Last 24 hours) at 08/30/2023 1222 Last data filed at 08/30/2023 0551 Gross per 24 hour  Intake 240 ml  Output 800 ml  Net -560 ml   Filed Weights   08/28/23 1626 08/29/23 0621 08/30/23 0537  Weight: (!) 195.7 kg (!) 195.3 kg (!) 194 kg      General: Appearance:    Severely obese female in no acute distress     Lungs:      respirations unlabored  Heart:    Normal heart rate.  MS:   All extremities are intact.   Neurologic:   Awake, alert     Labs   I have personally reviewed the following labs and imaging studies CBC    Component Value Date/Time   WBC 8.7 08/30/2023 0252   RBC 2.48 (L) 08/30/2023 0252   HGB 7.9 (L) 08/30/2023 0252   HCT 26.9 (L) 08/30/2023 0252   PLT 230 08/30/2023 0252   MCV 108.5 (H) 08/30/2023 0252   MCH 31.9 08/30/2023 0252   MCHC 29.4 (L) 08/30/2023 0252   RDW 15.4 08/30/2023 0252   LYMPHSABS 1.8  01/20/2021 0722   MONOABS 0.9 01/20/2021 0722   EOSABS 0.5 01/20/2021 0722   BASOSABS 0.0 01/20/2021 0722      Latest Ref Rng & Units 08/30/2023    2:52 AM 08/29/2023    2:48 AM 08/28/2023    5:55 AM  BMP  Glucose 70 - 99 mg/dL 88  78  98   BUN 8 - 23 mg/dL 70  64  66   Creatinine 0.44 - 1.00 mg/dL 3.29  5.18  8.41   Sodium 135 - 145 mmol/L 137  142  138   Potassium 3.5 - 5.1 mmol/L 4.8  5.0  5.1   Chloride 98 - 111 mmol/L 106  106  108   CO2 22 - 32 mmol/L 18  19  19    Calcium 8.9 - 10.3 mg/dL 8.8  9.5  8.5     US RENAL Result Date: 08/28/2023 CLINICAL DATA:  Acute renal failure superimposed on chronic kidney disease. EXAM: RENAL / URINARY TRACT ULTRASOUND COMPLETE COMPARISON:  Renal ultrasound 01/04/2021 FINDINGS: Right Kidney: Renal measurements: 10.1 x 5.1 x 4.6 cm = volume: 121 mL. Echogenicity within normal limits. No mass or hydronephrosis  visualized. Left Kidney: Renal measurements: 10.6 x 4.6 x 5.7 cm = volume: 147 mL. Echogenicity within normal limits. No mass or hydronephrosis visualized. Bladder: Appears normal for degree of bladder distention. Other: None. IMPRESSION: Normal renal ultrasound. Electronically Signed   By: Darliss Cheney M.D.   On: 08/28/2023 19:32    Disposition Plan & Communication  Patient status: Inpatient  Admitted From: Home Planned disposition location: Home   Family Communication: none at bedside    Author: Joseph Art, DO [facility] 08/30/2023, 12:22 PM   Available by Epic secure chat 7AM-7PM. If 7PM-7AM, please contact night-coverage.  [facility] contact information found on [email or web address].

## 2023-08-30 NOTE — Plan of Care (Signed)
   Problem: Clinical Measurements: Goal: Ability to maintain clinical measurements within normal limits will improve Outcome: Progressing   Problem: Clinical Measurements: Goal: Will remain free from infection Outcome: Progressing

## 2023-08-30 NOTE — Plan of Care (Signed)

## 2023-08-30 NOTE — Consult Note (Signed)
 Chief Complaint: Patient was seen in consultation today for AKI on CKD Chief Complaint  Patient presents with   Shortness of Breath   at the request of Delano Metz  Referring Physician(s): Delano Metz  Supervising Physician: Oley Balm  Patient Status: [facility] - In-pt  History of Present Illness: Erin Davila is a 63 y.o. female with PMHs of HTN, DM, CKD 5 who presents with AKI on CKD, IR was consulted for tunneled HD cath placement.   Patient has been followed by nephrology as outpatient and underwent LUE AVF creation in October 2024, the AVF needs superficialization per vascular surgery.   Patient came to ED on 2/19 with SOB, admitted due to acute resp failure, diastolic HF, and AKI on CKD. Nephrology has been following, patient did not response to diuretics, initiation of dialysis was recommended to the patient which she decided to proceed.   Patient laying in bed, not in acute distress.  Denise headache, fever, chills, shortness of breath, cough, chest pain, abdominal pain, nausea ,vomiting, and bleeding.  Patient is little upset that her fistula cannot be used for the dialysis but she understands the need of tunneled HD cath.    Past Medical History:  Diagnosis Date   Allergy    Anemia of chronic kidney failure    stage 5   Diabetes mellitus without complication (HCC)    type 2   GERD (gastroesophageal reflux disease)    History of iron deficiency anemia    Hyperlipidemia    Hypertension     Past Surgical History:  Procedure Laterality Date   AV FISTULA PLACEMENT Left 05/06/2023   Procedure: LEFT ARM RADIOCEPHALIC ARTERIOVENOUS (AV) FISTULA CREATION;  Surgeon: [name], MD;  Location: [facility] OR;  Service: Vascular;  Laterality: Left;   CESAREAN SECTION     x 1   INCISION AND DRAINAGE ABSCESS N/A 01/04/2021   Procedure: INCISION AND DRAINAGE LEFT BUTTOCK ABSCESS;  Surgeon: Luretha Murphy, MD;  Location: [facility] ORS;  Service: General;  Laterality:  N/A;    Allergies: Lactose  Medications: Prior to Admission medications   Medication Sig Start Date End Date Taking? Authorizing Provider  acetaminophen (TYLENOL) 500 MG tablet Take 500-1,000 mg by mouth every 6 (six) hours as needed for mild pain (pain score 1-3), moderate pain (pain score 4-6) or headache.   Yes [provider]  amLODipine (NORVASC) 10 MG tablet Take 10 mg by mouth at bedtime.   Yes [provider]  calcitRIOL (ROCALTROL) 0.25 MCG capsule Take 0.25 mcg by mouth daily. 03/21/23  Yes [provider]  calcitRIOL (ROCALTROL) 0.5 MCG capsule Take 0.5 mcg by mouth daily.   Yes [provider]  carvedilol (COREG) 6.25 MG tablet Take 6.25 mg by mouth 2 (two) times daily with a meal.   Yes [provider]  chlorthalidone (HYGROTON) 25 MG tablet Take 25 mg by mouth daily. 03/01/23  Yes [provider]  ferrous sulfate 325 (65 FE) MG tablet Take 325 mg by mouth 3 (three) times a week.   Yes [provider]  hydrALAZINE (APRESOLINE) 50 MG tablet Take 50 mg by mouth in the morning and at bedtime. 03/05/23  Yes [provider]  hyoscyamine (LEVSIN) 0.125 MG/5ML ELIX Take 10 mLs (0.25 mg total) by mouth every 6 (six) hours as needed for cramping (abdominal pain/discomfort). 01/21/21  Yes Angiulli, Mcarthur Rossetti, PA-C  insulin glargine (LANTUS SOLOSTAR) 100 UNIT/ML Solostar Pen Inject 44 Units into the skin daily. Patient taking differently:  Inject 38 Units into the skin at bedtime. 01/21/21  Yes Angiulli, Mcarthur Rossetti, PA-C  montelukast (SINGULAIR) 10 MG tablet Take 1 tablet (10 mg total) by mouth at bedtime. 01/21/21  Yes Angiulli, Mcarthur Rossetti, PA-C  Multiple Vitamin (MULTIVITAMIN WITH MINERALS) TABS tablet Take 1 tablet by mouth daily.   Yes [provider]  OZEMPIC, 2 MG/DOSE, 8 MG/3ML SOPN Inject 2 mg into the skin every Monday. 03/30/23  Yes [provider]  pantoprazole (PROTONIX) 40 MG tablet Take 1 tablet (40 mg  total) by mouth daily. Patient taking differently: Take 40 mg by mouth daily after lunch. 01/21/21  Yes Angiulli, Mcarthur Rossetti, PA-C  simvastatin (ZOCOR) 40 MG tablet Take 1 tablet (40 mg total) by mouth at bedtime. 01/21/21  Yes Angiulli, Mcarthur Rossetti, PA-C  HYDROcodone-acetaminophen (NORCO) 5-325 MG tablet Take 1 tablet by mouth every 6 (six) hours as needed for moderate pain (pain score 4-6). Patient not taking: Reported on 08/26/2023 05/06/23   Emilie Rutter, PA-C     History reviewed. No pertinent family history.  Social History   Socioeconomic History   Marital status: Single    Spouse name: Not on file   Number of children: Not on file   Years of education: Not on file   Highest education level: Not on file  Occupational History   Not on file  Tobacco Use   Smoking status: Never   Smokeless tobacco: Never  Vaping Use   Vaping status: Never Used  Substance and Sexual Activity   Alcohol use: No   Drug use: No   Sexual activity: Not Currently    Birth control/protection: Post-menopausal  Other Topics Concern   Not on file  Social History Narrative   Not on file   Social Drivers of Health   Financial Resource Davila: Not on file  Food Insecurity: No Food Insecurity (08/26/2023)   Hunger Vital Sign    Worried About Running Out of Food in the Last Year: Never true    Ran Out of Food in the Last Year: Never true  Transportation Needs: No Transportation Needs (08/26/2023)   PRAPARE - Administrator, Civil Service (Medical): No    Lack of Transportation (Non-Medical): No  Physical Activity: Not on file  Stress: Not on file  Social Connections: Not on file     Review of Systems: A 12 point ROS discussed and pertinent positives are indicated in the HPI above.  All other systems are negative.  Vital Signs: BP (!) 162/64 (BP Location: Right Wrist)   Pulse 74   Temp 99 F (37.2 C) (Oral)   Resp 20   Ht 5\' 8"  (1.727 m)   Wt (!) 427 lb 11.1 oz (194 kg)   LMP  03/26/2015   SpO2 97%   BMI 65.03 kg/m    Physical Exam Vitals reviewed.  Constitutional:      General: She is not in acute distress.    Appearance: She is obese. She is not ill-appearing.  HENT:     Head: Normocephalic and atraumatic.     Mouth/Throat:     Mouth: Mucous membranes are moist.     Pharynx: Oropharynx is clear.  Cardiovascular:     Rate and Rhythm: Normal rate and regular rhythm.  Pulmonary:     Effort: Pulmonary effort is normal.     Breath sounds: Normal breath sounds.  Musculoskeletal:     Cervical back: Neck supple.  Skin:    General: Skin is warm  and dry.     Coloration: Skin is not cyanotic or pale.  Neurological:     Mental Status: She is alert and oriented to person, place, and time.  Psychiatric:        Mood and Affect: Mood normal.        Behavior: Behavior normal.     MD Evaluation Airway: WNL Heart: WNL Abdomen: WNL Chest/ Lungs: WNL ASA  Classification: 3 Mallampati/Airway Score: Two  Imaging: US RENAL Result Date: 08/28/2023 CLINICAL DATA:  Acute renal failure superimposed on chronic kidney disease. EXAM: RENAL / URINARY TRACT ULTRASOUND COMPLETE COMPARISON:  Renal ultrasound 01/04/2021 FINDINGS: Right Kidney: Renal measurements: 10.1 x 5.1 x 4.6 cm = volume: 121 mL. Echogenicity within normal limits. No mass or hydronephrosis visualized. Left Kidney: Renal measurements: 10.6 x 4.6 x 5.7 cm = volume: 147 mL. Echogenicity within normal limits. No mass or hydronephrosis visualized. Bladder: Appears normal for degree of bladder distention. Other: None. IMPRESSION: Normal renal ultrasound. Electronically Signed   By: Darliss Cheney M.D.   On: 08/28/2023 19:32   ECHOCARDIOGRAM COMPLETE Result Date: 08/27/2023    ECHOCARDIOGRAM REPORT   Patient Name:   Erin Davila Date of Exam: 08/27/2023 Medical Rec #:  [id number]        Height:       68.0 in Accession #:    [id number]       Weight:       400.0 lb Date of Birth:  Oct 14, 1960         BSA:           2.744 m Patient Age:    63 years         BP:           144/72 mmHg Patient Gender: F                HR:           80 bpm. Exam Location:  Inpatient Procedure: 2D Echo, Cardiac Doppler and Color Doppler (Both Spectral and Color            Flow Doppler were utilized during procedure). Indications:    I50.40* Unspecified combined systolic (congestive) and diastolic                 (congestive) heart failure  History:        Patient has no prior history of Echocardiogram examinations.                 Signs/Symptoms:Shortness of Breath and Dyspnea; Risk                 Factors:Diabetes and Hypertension.  Sonographer:    Sheralyn Boatman RDCS Referring Phys: [id number] Leeroy Bock  Sonographer Comments: Technically difficult study due to poor echo windows, suboptimal parasternal window, suboptimal apical window and suboptimal subcostal window. Image acquisition challenging due to patient body habitus. No IV access per patient, stated she only has port. IMPRESSIONS  1. Left ventricular ejection fraction, by estimation, is 60 to 65%. The left ventricle has normal function. Left ventricular endocardial border not optimally defined to evaluate regional wall motion. The left ventricular internal cavity size was moderately to severely dilated. There is mild left ventricular hypertrophy. Left ventricular diastolic parameters are indeterminate.  2. Right ventricular systolic function is mildly reduced. The right ventricular size is mildly enlarged. There is normal pulmonary artery systolic pressure. The estimated right ventricular systolic pressure is 34.6 mmHg.  3. The mitral valve is  degenerative. Trivial mitral valve regurgitation. No evidence of mitral stenosis.  4. The aortic valve is tricuspid. There is mild thickening of the aortic valve. Aortic valve regurgitation is trivial. Aortic valve sclerosis is present, with no evidence of aortic valve stenosis.  5. The inferior vena cava is dilated in size with >50% respiratory  variability, suggesting right atrial pressure of 8 mmHg.  6. Increased flow velocities may be secondary to anemia, thyrotoxicosis, hyperdynamic or high flow state, suspect high output state given elevated mitral and aortic valve velocities. FINDINGS  Left Ventricle: Left ventricular ejection fraction, by estimation, is 60 to 65%. The left ventricle has normal function. Left ventricular endocardial border not optimally defined to evaluate regional wall motion. The left ventricular internal cavity size was moderately to severely dilated. There is mild left ventricular hypertrophy. Left ventricular diastolic parameters are indeterminate. Right Ventricle: The right ventricular size is mildly enlarged. Right vetricular wall thickness was not well visualized. Right ventricular systolic function is mildly reduced. There is normal pulmonary artery systolic pressure. The tricuspid regurgitant velocity is 2.58 m/s, and with an assumed right atrial pressure of 8 mmHg, the estimated right ventricular systolic pressure is 34.6 mmHg. Left Atrium: Left atrial size was normal in size. Right Atrium: Right atrial size was not well visualized. Pericardium: There is no evidence of pericardial effusion. Mitral Valve: The mitral valve is degenerative in appearance. Mild to moderate mitral annular calcification. Trivial mitral valve regurgitation. No evidence of mitral valve stenosis. The mean mitral valve gradient is 4.7 mmHg with average heart rate of 0  bpm. Tricuspid Valve: The tricuspid valve is grossly normal. Tricuspid valve regurgitation is mild . No evidence of tricuspid stenosis. Aortic Valve: The aortic valve is tricuspid. There is mild thickening of the aortic valve. Aortic valve regurgitation is trivial. Aortic valve sclerosis is present, with no evidence of aortic valve stenosis. Aortic valve mean gradient measures 11.2 mmHg.  Aortic valve peak gradient measures 19.6 mmHg. Aortic valve area, by VTI measures 1.64 cm.  Pulmonic Valve: The pulmonic valve was grossly normal. Pulmonic valve regurgitation is trivial. Aorta: The aortic root and ascending aorta are structurally normal, with no evidence of dilitation. Venous: The inferior vena cava is dilated in size with greater than 50% respiratory variability, suggesting right atrial pressure of 8 mmHg. IAS/Shunts: No atrial level shunt detected by color flow Doppler. Additional Comments: 3D imaging was not performed. There is pleural effusion in the left lateral region.  LEFT VENTRICLE PLAX 2D LVIDd:         5.00 cm      Diastology LVIDs:         3.70 cm      LV e' medial:    8.59 cm/s LV PW:         1.20 cm      LV E/e' medial:  14.4 LV IVS:        1.10 cm      LV e' lateral:   4.35 cm/s LVOT diam:     2.20 cm      LV E/e' lateral: 28.4 LV SV:         70 LV SV Index:   25 LVOT Area:     3.80 cm  LV Volumes (MOD) LV vol d, MOD A2C: 146.0 ml LV vol d, MOD A4C: 134.5 ml LV vol s, MOD A2C: 50.6 ml LV vol s, MOD A4C: 51.8 ml LV SV MOD A2C:     95.4 ml LV SV MOD A4C:  134.5 ml LV SV MOD BP:      89.8 ml RIGHT VENTRICLE            IVC RV S prime:     8.81 cm/s  IVC diam: 2.20 cm TAPSE (M-mode): 2.4 cm LEFT ATRIUM            Index LA diam:      5.30 cm  1.93 cm/m LA Vol (A4C): 107.0 ml 39.00 ml/m  AORTIC VALVE AV Area (Vmax):    1.84 cm AV Area (Vmean):   1.67 cm AV Area (VTI):     1.64 cm AV Vmax:           221.42 cm/s AV Vmean:          154.814 cm/s AV VTI:            0.424 m AV Peak Grad:      19.6 mmHg AV Mean Grad:      11.2 mmHg LVOT Vmax:         107.00 cm/s LVOT Vmean:        67.900 cm/s LVOT VTI:          0.183 m LVOT/AV VTI ratio: 0.43  AORTA Ao Root diam: 3.00 cm Ao Asc diam:  3.30 cm MITRAL VALVE                TRICUSPID VALVE MV Area (PHT): 3.15 cm     TR Peak grad:   26.6 mmHg MV Mean grad:  4.7 mmHg     TR Vmax:        258.00 cm/s MV Decel Time: 241 msec MV E velocity: 123.50 cm/s  SHUNTS MV A velocity: 115.00 cm/s  Systemic VTI:  0.18 m MV E/A ratio:  1.07          Systemic Diam: 2.20 cm Weston Brass MD Electronically signed by Weston Brass MD Signature Date/Time: 08/27/2023/1:41:29 PM    Final    VAS [place] LOWER EXTREMITY VENOUS (DVT) Result Date: 08/26/2023  Lower Venous DVT Study Patient Name:  Erin Davila  Date of Exam:   08/26/2023 Medical Rec #: [id number]         Accession #:    [id number] Date of Birth: 1961/06/10          Patient Gender: F Patient Age:   66 years Exam Location:  [facility] Procedure:      VAS [place] LOWER EXTREMITY VENOUS (DVT) Referring Phys: Jamelle Rushing --------------------------------------------------------------------------------  Indications: Swelling.  Risk Factors: None identified. Limitations: Body habitus and poor ultrasound/tissue interface. Comparison Study: No prior studies. Performing Technologist: Chanda Busing RVT  Examination Guidelines: A complete evaluation includes B-mode imaging, spectral Doppler, color Doppler, and power Doppler as needed of all accessible portions of each vessel. Bilateral testing is considered an integral part of a complete examination. Limited examinations for reoccurring indications may be performed as noted. The reflux portion of the exam is performed with the patient in reverse Trendelenburg.  +---------+---------------+---------+-----------+----------+--------------+ RIGHT    CompressibilityPhasicitySpontaneityPropertiesThrombus Aging +---------+---------------+---------+-----------+----------+--------------+ CFV      Full           Yes      Yes                                 +---------+---------------+---------+-----------+----------+--------------+ SFJ      Full                                                        +---------+---------------+---------+-----------+----------+--------------+  FV Prox  Full                                                        +---------+---------------+---------+-----------+----------+--------------+ FV Mid   Full                                                         +---------+---------------+---------+-----------+----------+--------------+ FV DistalFull                                                        +---------+---------------+---------+-----------+----------+--------------+ PFV      Full                                                        +---------+---------------+---------+-----------+----------+--------------+ POP      Full           Yes      Yes                                 +---------+---------------+---------+-----------+----------+--------------+ PTV      Full                                                        +---------+---------------+---------+-----------+----------+--------------+ PERO     Full                                                        +---------+---------------+---------+-----------+----------+--------------+   +---------+---------------+---------+-----------+----------+--------------+ LEFT     CompressibilityPhasicitySpontaneityPropertiesThrombus Aging +---------+---------------+---------+-----------+----------+--------------+ CFV      Full           Yes      Yes                                 +---------+---------------+---------+-----------+----------+--------------+ SFJ      Full                                                        +---------+---------------+---------+-----------+----------+--------------+ FV Prox  Full                                                        +---------+---------------+---------+-----------+----------+--------------+  FV Mid   Full                                                        +---------+---------------+---------+-----------+----------+--------------+ FV DistalFull                                                        +---------+---------------+---------+-----------+----------+--------------+ PFV      Full                                                         +---------+---------------+---------+-----------+----------+--------------+ POP      Full           Yes      Yes                                 +---------+---------------+---------+-----------+----------+--------------+ PTV      Full                                                        +---------+---------------+---------+-----------+----------+--------------+ PERO     Full                                                        +---------+---------------+---------+-----------+----------+--------------+     Summary: RIGHT: - There is no evidence of deep vein thrombosis in the lower extremity. However, portions of this examination were limited- see technologist comments above.  - No cystic structure found in the popliteal fossa.  LEFT: - There is no evidence of deep vein thrombosis in the lower extremity. However, portions of this examination were limited- see technologist comments above.  - No cystic structure found in the popliteal fossa.  *See table(s) above for measurements and observations. Electronically signed by Coral Else MD on 08/26/2023 at 8:14:26 PM.    Final    DG Chest 2 View Result Date: 08/26/2023 CLINICAL DATA:  Shortness of breath.  History of stage IV CKD. EXAM: CHEST - 2 VIEW COMPARISON:  None Available. FINDINGS: The cardiac silhouette is enlarged. Central pulmonary vascular congestion. Limited evaluation of the lateral view due to suboptimal positioning and overlying soft tissues. No appreciable focal consolidation, sizeable pleural effusion, or pneumothorax. No acute osseous abnormality identified. IMPRESSION: Cardiomegaly with central pulmonary vascular congestion. Electronically Signed   By: Hart Robinsons M.D.   On: 08/26/2023 10:38    Labs:  CBC: Recent Labs    08/26/23 1025 08/26/23 1502 08/27/23 0601 08/30/23 0252  WBC 7.0 6.6 7.8 8.7  HGB 8.6* 8.4* 8.9* 7.9*  HCT 29.8* 28.7* 32.3* 26.9*  PLT 244 243 299 230  COAGS: No results for  input(s): "INR", "APTT" in the last 8760 hours.  BMP: Recent Labs    08/27/23 0601 08/28/23 0555 08/29/23 0248 08/30/23 0252  NA 142 138 142 137  K 5.1 5.1 5.0 4.8  CL 112* 108 106 106  CO2 19* 19* 19* 18*  GLUCOSE 113* 98 78 88  BUN 57* 66* 64* 70*  CALCIUM 8.8* 8.5* 9.5 8.8*  CREATININE 6.11* 6.58* 6.86* 7.23*  GFRNONAA 7* 7* 6* 6*    LIVER FUNCTION TESTS: Recent Labs    08/26/23 1025 08/27/23 0601 08/28/23 0555 08/29/23 0248 08/30/23 0252  BILITOT 0.8 0.5  --   --   --   AST 14* 19  --   --   --   ALT 22 22  --   --   --   ALKPHOS 83 80  --   --   --   PROT 7.7 7.8  --   --   --   ALBUMIN 3.4* 3.5 3.4* 3.0* 3.0*    TUMOR MARKERS: No results for input(s): "AFPTM", "CEA", "CA199", "CHROMGRNA" in the last 8760 hours.  Assessment and Plan: 63 y.o. female with AKI on CKD who is in need of HD access.   VSS CBC stable  On sq heparin q 8 h, no need for d/c   Allergies reviewed  3 g ancef during the procedure   Risks and benefits discussed with the patient including, but not limited to bleeding, infection, vascular injury, pneumothorax which may require chest tube placement, air embolism or even death  All of the patient's questions were answered, patient is agreeable to proceed. Consent signed and in IR.   The procedure is tentatively scheduled for tomorrow pending IR schedule.   PLAN - NPO except meds at [facility] - 3 g Ancef to e given in IR, signed and held    Thank you for this interesting consult.  I greatly enjoyed meeting Erin Davila and look forward to participating in their care.  A copy of this report was sent to the requesting provider on this date.  Electronically Signed: Willette Brace, PA-C 08/30/2023, 12:53 PM   I spent a total of 20 Minutes    in face to face in clinical consultation, greater than 50% of which was counseling/coordinating care for tunneled HD cath placement.   This chart was dictated using voice recognition software.  Despite  best efforts to proofread,  errors can occur which can change the documentation meaning.

## 2023-08-30 NOTE — Progress Notes (Signed)
 [facility] Progress Note  Subjective:  Seen by cardiology, appreciate assistance 1000 cc UOP yesterday again  Vitals:   08/29/23 2356 08/30/23 0409 08/30/23 0537 08/30/23 0734  BP: (!) 153/58 (!) 156/66  (!) 159/68  Pulse: 73 76  72  Resp: 17 17  18   Temp: 98.8 F (37.1 C) 98.5 F (36.9 C)  98.8 F (37.1 C)  TempSrc: Oral Oral  Oral  SpO2: 98% 99%  94%  Weight:   (!) 194 kg   Height:        Exam: Gen alert, no distress, 3 L Star O2 +bilat JVD Chest basilar crackles bilaterally RRR no MRG Abd soft ntnd no mass or ascites +bs Ext 2+ pretib edema / 1+ bilat hip edema Neuro is alert, Ox 3 , nf, no asterixis  LUE AVF+bruit         Renal-related home meds: - rocaltrol 0.25 mcg every day - hygroton 25 every day - norvasc 10 hs - coreg 6.25 bid - hydralazine 50 tid - others: insulins, statin, norco, PPI, ozempic, singulair     Date                             Creat               eGFR (ml/min) 2022                            4.91 >> 3.21    10 >> 16 ml/min            05/06/23                      5.90                 7 ml/min 08/26/23                        5.31, 5.45        9, 8 ml/min 08/27/23                        6.11                 7 ml/min   UA - 30 prot, 11-20 rbc, 21-50 wbc, 0-5 epi UNa 55, UCr 133 CXR 2/19 - CM w/ vasc congestion   Echo - LVEF 60-65%, mild LVH, mod-severely dilated internal LV size. RV mild enlarged, RV fxn mildly reduced. No valve issues. Possible high output state given mitral and aortic valve velocities.     Assessment/ Plan: AKI on CKD 5 - b/l creatinine is 5.9 from oct 2024. Creat here 5.4 on admission in the setting of new-onset vol overload w/ pulm edema. UA w/ mild hematuria/ pyuria, [place] w/o obstruction. AKI due to CHF vs progression of CKD. Did not respond to 100 lasix IV tid and is making urine on Lasix 160mg  but still not enough. Creat rising.  Not responding to highest dose diuretics, will need dialysis. Pt will be  ESRD.  Will consult IR for [facility] Monday and HD orders in for Monday. Have d/w pt who is in agreement.  Volume overload/ acute hypoxic resp failure - 1st time w/ volume overload like this. . CXR showed vasc congestion and sig LE edema by exam. ECHO showed nl LV, mild RV failure. Seen by cardiology,  appreciate assistance.  Will plan for dialysis to manage volume. Cont diuretics for now.  HTN - poorly cont'd, cont meds and diuresis to get volume down.  HD access - AVF surgery done in October 2024, still needs superficialization per last note in 06/2023 (Dr Lenell Antu).           Vinson Moselle MD  [facility] 08/30/2023, 8:18 AM  Recent Labs  Lab 08/27/23 0601 08/28/23 0555 08/29/23 0248 08/30/23 0252  HGB 8.9*  --   --  7.9*  ALBUMIN 3.5   < > 3.0* 3.0*  CALCIUM 8.8*   < > 9.5 8.8*  PHOS  --    < > 5.9* 5.8*  CREATININE 6.11*   < > 6.86* 7.23*  K 5.1   < > 5.0 4.8   < > = values in this interval not displayed.   No results for input(s): "IRON", "TIBC", "FERRITIN" in the last 168 hours. Inpatient medications:  amLODipine  10 mg Oral QHS   atorvastatin  20 mg Oral QHS   calcitRIOL  0.5 mcg Oral Daily   carvedilol  6.25 mg Oral BID WC   heparin  5,000 Units Subcutaneous Q8H   hydrALAZINE  50 mg Oral Q8H   influenza vac split trivalent PF  0.5 mL Intramuscular Tomorrow-1000   insulin aspart  0-15 Units Subcutaneous TID WC   insulin aspart  0-5 Units Subcutaneous QHS   insulin glargine-yfgn  30 Units Subcutaneous Daily   metolazone  5 mg Oral BID   montelukast  10 mg Oral QHS   polyethylene glycol  17 g Oral Daily    furosemide 160 mg (08/30/23 0615)   acetaminophen **OR** acetaminophen, bisacodyl

## 2023-08-31 ENCOUNTER — Inpatient Hospital Stay (HOSPITAL_COMMUNITY): Payer: 59

## 2023-08-31 DIAGNOSIS — R0902 Hypoxemia: Secondary | ICD-10-CM | POA: Diagnosis not present

## 2023-08-31 HISTORY — PX: IR FLUORO GUIDE CV LINE RIGHT: IMG2283

## 2023-08-31 HISTORY — PX: IR US GUIDE VASC ACCESS RIGHT: IMG2390

## 2023-08-31 LAB — GLUCOSE, CAPILLARY
Glucose-Capillary: 71 mg/dL (ref 70–99)
Glucose-Capillary: 76 mg/dL (ref 70–99)
Glucose-Capillary: 93 mg/dL (ref 70–99)
Glucose-Capillary: 79 mg/dL (ref 70–99)

## 2023-08-31 MED ORDER — LIDOCAINE-EPINEPHRINE 1 %-1:100000 IJ SOLN
INTRAMUSCULAR | Status: AC
  Filled 2023-08-31: qty 1

## 2023-08-31 MED ORDER — MIDAZOLAM HCL 2 MG/2ML IJ SOLN
INTRAMUSCULAR | Status: AC | PRN
  Administered 2023-08-31 (×2): .5 mg via INTRAVENOUS

## 2023-08-31 MED ORDER — INSULIN GLARGINE 100 UNIT/ML ~~LOC~~ SOLN
20.0000 [IU] | Freq: Every day | SUBCUTANEOUS | Status: DC
  Administered 2023-09-01: 20 [IU] via SUBCUTANEOUS
  Filled 2023-08-31 (×3): qty 0.2

## 2023-08-31 MED ORDER — MIDAZOLAM HCL 2 MG/2ML IJ SOLN
INTRAMUSCULAR | Status: AC
  Filled 2023-08-31: qty 2

## 2023-08-31 MED ORDER — DEXTROSE 50 % IV SOLN: 1.0000 | Freq: Every day | INTRAVENOUS | Status: DC | PRN

## 2023-08-31 MED ORDER — FENTANYL CITRATE (PF) 100 MCG/2ML IJ SOLN
INTRAMUSCULAR | Status: AC | PRN
  Administered 2023-08-31: 50 ug via INTRAVENOUS

## 2023-08-31 MED ORDER — FENTANYL CITRATE (PF) 100 MCG/2ML IJ SOLN
INTRAMUSCULAR | Status: AC
  Filled 2023-08-31: qty 2

## 2023-08-31 MED ORDER — CEFAZOLIN SODIUM-DEXTROSE 3-4 GM/150ML-% IV SOLN
3.0000 g | INTRAVENOUS | Status: AC
  Administered 2023-08-31: 3 g via INTRAVENOUS
  Filled 2023-08-31: qty 150

## 2023-08-31 NOTE — TOC CM/SW Note (Signed)
 Transition of Care [facility]) - Inpatient Brief Assessment   Patient Details  Name: Erin Davila MRN: [id number] Date of Birth: 1961-05-14  Transition of Care [facility]) CM/SW Contact:    Leone Haven, RN Phone Number: 08/31/2023, 5:13 PM   Clinical Narrative: Chauncey Mann from [facility]  From home with room mate, has PCP and insurance on file, states has no HH services in place at this time or DME at home.  States sister will transport them home at [employer] and sister is support system, states gets medications from [facility] on [address]  Pta self ambulatory .   Transition of Care Asessment: Insurance and Status: Insurance coverage has been reviewed Patient has primary care physician: Yes Haskel Schroeder) Home environment has been reviewed: lives with room mate Prior level of function:: indep Prior/Current Home Services: No current home services Social Drivers of Health Review: SDOH reviewed no interventions necessary Readmission risk has been reviewed: Yes Transition of care needs: no transition of care needs at this time

## 2023-08-31 NOTE — Progress Notes (Signed)
  [facility] Progress Note    Assessment/ Plan:   AKI on CKD 5, now ESRD - b/l creatinine is 5.9 from oct 2024. Creat here 5.4 on admission in the setting of new-onset vol overload w/ pulm edema. UA w/ mild hematuria/ pyuria, [place] w/o obstruction. AKI due to CHF vs progression of CKD. Did not respond to 100 lasix IV tid and is making urine on Lasix 160mg  but still not enough. Creat rising.   -did not robustly respond to aggressive/high dose diuretics therefore IR consulted for [facility] placement (AVF not ready to use). Slow start protocol--Plan for HD#1 today once catheter in place. #2 planned for tomorrow  -will get CLIP on board Volume overload/ acute hypoxic resp failure - 1st time w/ volume overload like this. . CXR showed vasc congestion and sig LE edema by exam. ECHO showed nl LV, mild RV failure. Seen by cardiology, appreciate assistance.  Will plan for dialysis to manage volume. Cont diuretics for now until stable on HD/with UF---long term plan is diuretics on off-HD days once a HD is more clear HTN - poorly cont'd, cont meds and diuresis to get volume down. Will UF as tolerated with HD HD access - AVF surgery done in October 2024, still needs superficialization per last note in 06/2023 (Dr Lenell Antu).  Will engage with [facility] once her volume status has been optimized to see if they can do her 2nd stage while she's here     Subjective:   Patient seen and examined bedside. No acute events. Breathing is about the same. She reports that she is urinating still. Called her sister Alvis Lemmings per patient request. Explained the plan to her as well.   Objective:   BP (!) 166/67 (BP Location: Right Wrist)   Pulse 76   Temp 97.8 F (36.6 C) (Oral)   Resp 17   Ht 5\' 8"  (1.727 m)   Wt (!) 191.4 kg   LMP 03/26/2015   SpO2 97%   BMI 64.16 kg/m   Intake/Output Summary (Last 24 hours) at 08/31/2023 9563 Last data filed at 08/31/2023 0725 Gross per 24 hour  Intake 100 ml  Output 1450 ml  Net -1350  ml   Weight change: -2.582 kg  Physical Exam: Gen: NAD CVS: RRR Resp: bibasilar crackles, not speaking in full sentences intermittently Abd: obese, soft, nt Ext: +pretib edema Neuro: awake, alert Dialysis access: LUE AVF +b/t  Imaging: No results found.  Labs: BMET Recent Labs  Lab 08/26/23 1025 08/26/23 1502 08/27/23 0601 08/28/23 0555 08/29/23 0248 08/30/23 0252  NA 144  --  142 138 142 137  K 4.6  --  5.1 5.1 5.0 4.8  CL 115*  --  112* 108 106 106  CO2 20*  --  19* 19* 19* 18*  GLUCOSE 131*  --  113* 98 78 88  BUN 57*  --  57* 66* 64* 70*  CREATININE 5.31* 5.45* 6.11* 6.58* 6.86* 7.23*  CALCIUM 8.7*  --  8.8* 8.5* 9.5 8.8*  PHOS  --   --   --  5.7* 5.9* 5.8*   CBC Recent Labs  Lab 08/26/23 1025 08/26/23 1502 08/27/23 0601 08/30/23 0252  WBC 7.0 6.6 7.8 8.7  HGB 8.6* 8.4* 8.9* 7.9*  HCT 29.8* 28.7* 32.3* 26.9*  MCV 108.8* 109.5* 116.2* 108.5*  PLT 244 243 299 230    Medications:     amLODipine  10 mg Oral QHS   atorvastatin  20 mg Oral QHS   calcitRIOL  0.5  mcg Oral Daily   carvedilol  6.25 mg Oral BID WC   Chlorhexidine Gluconate Cloth  6 each Topical Q0600   heparin  5,000 Units Subcutaneous Q8H   hydrALAZINE  50 mg Oral Q8H   insulin aspart  0-15 Units Subcutaneous TID WC   insulin aspart  0-5 Units Subcutaneous QHS   insulin glargine-yfgn  20 Units Subcutaneous Daily   metolazone  5 mg Oral BID   montelukast  10 mg Oral QHS   polyethylene glycol  17 g Oral Daily      Anthony Sar, MD [facility] 08/31/2023, 9:03 AM

## 2023-08-31 NOTE — Plan of Care (Signed)

## 2023-08-31 NOTE — Progress Notes (Signed)
 Requested to see pt for out-pt HD needs at d/c. Met with pt at bedside. Introduced self and explained role. Discussed out-pt HD options- in-center vs TCU. Pt works F/T from home. Pt states she is unable to make decision today regarding best option at d/c. Will f/u with pt tomorrow and assist as needed.   Olivia Canter Renal Navigator [phone]

## 2023-08-31 NOTE — Procedures (Signed)
 Interventional Radiology Procedure Note  Procedure: rt internal jugular HD TUNNELED CATH  Complications: None  Estimated Blood Loss: min  Findings: TIP SVCRA

## 2023-08-31 NOTE — Progress Notes (Signed)
 PROGRESS NOTE  Erin Davila    DOB: 02/13/1961, 63 y.o.  ZOX:[id number]    Code Status: Full Code   DOA: 08/26/2023   LOS: 5   Brief hospital course  Erin Davila is a 63 y.o. 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 At baseline, independent with ADLs.   Presented from home to the ED on 08/26/2023 with SOB x a couple months intermittently but severely worsened today. She states that since October she has been working from home and more sedentary since then. She endorses lower extremity swelling during this time and thinks its due to sitting longer periods of time. She has intermittent shortness of breath which is worse with temperature changes so thinks the cold weather is what worsened it today.     Admitted to [facility] and transferred to [facility] for cardiology evaluation. Now starting HD.    Assessment & Plan  Principal Problem:   Hypoxia   Acute hypoxic respiratory failure-  -wean to RA as able  Anemia -most likely chronic disease and volume dilution-- check after HD in AM  Diastolic heart failure- EF 60-65%.  Left ventricular internal cavity moderately to severely dilated with mild left ventricular hypertrophy.  Diastolic parameters are indeterminate based off the echo read. 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 Appears to have roughly stable Cr from baseline on presentation.  - follow Cr closely with lasix use - nephrology consulted-- plan to start HD 2/24 after temp catheter placed   Type II DM- takes ozempic at home.  -SSI -reduce lantus again   HLD- continue home simvastatin   GERD- continue home protonix  obesity Body mass index is 64.16 kg/m.  VTE ppx: heparin injection 5,000 Units Start: 08/26/23 1415    Consultants: Nephrology   Subjective 08/31/23    Tired    Objective   Vitals:   08/31/23  1105 08/31/23 1145 08/31/23 1150 08/31/23 1204  BP: (!) 158/67 135/68 (!) 142/61 (!) 145/54  Pulse: 74 72 72 74  Resp:  16 18   Temp:      TempSrc:      SpO2: 94% 90% 94%   Weight:      Height:        Intake/Output Summary (Last 24 hours) at 08/31/2023 1207 Last data filed at 08/31/2023 0725 Gross per 24 hour  Intake 100 ml  Output 1450 ml  Net -1350 ml   Filed Weights   08/29/23 0621 08/30/23 0537 08/31/23 0457  Weight: (!) 195.3 kg (!) 194 kg (!) 191.4 kg      General: Appearance:    Severely obese female in no acute distress     Lungs:      respirations unlabored  Heart:    Normal heart rate.  MS:   All extremities are intact.   Neurologic:   Awake, alert     Labs   I have personally reviewed the following labs and imaging studies CBC    Component Value Date/Time   WBC 8.7 08/30/2023 0252   RBC 2.48 (L) 08/30/2023 0252   HGB 7.9 (L) 08/30/2023 0252   HCT 26.9 (L) 08/30/2023 0252   PLT 230 08/30/2023 0252   MCV 108.5 (H) 08/30/2023 0252   MCH 31.9 08/30/2023 0252   MCHC 29.4 (L) 08/30/2023 0252   RDW 15.4 08/30/2023 0252  LYMPHSABS 1.8 01/20/2021 0722   MONOABS 0.9 01/20/2021 0722   EOSABS 0.5 01/20/2021 0722   BASOSABS 0.0 01/20/2021 0722      Latest Ref Rng & Units 08/30/2023    2:52 AM 08/29/2023    2:48 AM 08/28/2023    5:55 AM  BMP  Glucose 70 - 99 mg/dL 88  78  98   BUN 8 - 23 mg/dL 70  64  66   Creatinine 0.44 - 1.00 mg/dL 8.65  7.84  6.96   Sodium 135 - 145 mmol/L 137  142  138   Potassium 3.5 - 5.1 mmol/L 4.8  5.0  5.1   Chloride 98 - 111 mmol/L 106  106  108   CO2 22 - 32 mmol/L 18  19  19    Calcium 8.9 - 10.3 mg/dL 8.8  9.5  8.5     IR Fluoro Guide CV Line Right Result Date: 08/31/2023 INDICATION: End-stage renal disease, no current access for dialysis EXAM: ULTRASOUND GUIDANCE FOR VASCULAR ACCESS RIGHT INTERNAL JUGULAR PERMANENT HEMODIALYSIS CATHETER Date:  08/31/2023 08/31/2023 11:53 am Radiologist:  Judie Petit. Ruel Favors, MD Guidance:   Ultrasound and fluoroscopic FLUOROSCOPY: Fluoroscopy Time: 1 minutes 18 seconds (80 mGy). MEDICATIONS: Ancef 2 g within 1 hour of the procedure ANESTHESIA/SEDATION: Versed 0.5 mg IV; Fentanyl 25 mcg IV; Moderate Sedation Time:  15 minute The patient was continuously monitored during the procedure by the interventional radiology nurse under my direct supervision. CONTRAST:  None. COMPLICATIONS: None immediate. PROCEDURE: Informed consent was obtained from the patient following explanation of the procedure, risks, benefits and alternatives. The patient understands, agrees and consents for the procedure. All questions were addressed. A time out was performed. Maximal barrier sterile technique utilized including caps, mask, sterile gowns, sterile gloves, large sterile drape, hand hygiene, and 2% chlorhexidine scrub. Under sterile conditions and local anesthesia, right internal jugular micropuncture venous access was performed with ultrasound. Images were obtained for documentation of the patent right internal jugular vein. A guide wire was inserted followed by a transitional dilator. Next, a 0.035 guidewire was advanced into the IVC with a 5-French catheter. Measurements were obtained from the right venotomy site to the proximal right atrium. In the right infraclavicular chest, a subcutaneous tunnel was created under sterile conditions and local anesthesia. 1% lidocaine with epinephrine was utilized for this. The 23 cm tip to cuff palindrome catheter was tunneled subcutaneously to the venotomy site and inserted into the SVC/RA junction through a valved peel-away sheath. Position was confirmed with fluoroscopy. Images were obtained for documentation. Blood was aspirated from the catheter followed by saline and heparin flushes. The appropriate volume and strength of heparin was instilled in each lumen. Caps were applied. The catheter was secured at the tunnel site with Gelfoam and a pursestring suture. The venotomy site  was closed with subcuticular Vicryl suture. Dermabond was applied to the small right neck incision. A dry sterile dressing was applied. The catheter is ready for use. No immediate complications. IMPRESSION: Ultrasound and fluoroscopically guided right internal jugular tunneled hemodialysis catheter (23 cm tip to cuff palindrome catheter). Electronically Signed   By: Judie Petit.  Shick M.D.   On: 08/31/2023 12:00   IR US Guide Vasc Access Right Result Date: 08/31/2023 INDICATION: End-stage renal disease, no current access for dialysis EXAM: ULTRASOUND GUIDANCE FOR VASCULAR ACCESS RIGHT INTERNAL JUGULAR PERMANENT HEMODIALYSIS CATHETER Date:  08/31/2023 08/31/2023 11:53 am Radiologist:  M. Ruel Favors, MD Guidance:  Ultrasound and fluoroscopic FLUOROSCOPY: Fluoroscopy Time: 1 minutes 18 seconds (80  mGy). MEDICATIONS: Ancef 2 g within 1 hour of the procedure ANESTHESIA/SEDATION: Versed 0.5 mg IV; Fentanyl 25 mcg IV; Moderate Sedation Time:  15 minute The patient was continuously monitored during the procedure by the interventional radiology nurse under my direct supervision. CONTRAST:  None. COMPLICATIONS: None immediate. PROCEDURE: Informed consent was obtained from the patient following explanation of the procedure, risks, benefits and alternatives. The patient understands, agrees and consents for the procedure. All questions were addressed. A time out was performed. Maximal barrier sterile technique utilized including caps, mask, sterile gowns, sterile gloves, large sterile drape, hand hygiene, and 2% chlorhexidine scrub. Under sterile conditions and local anesthesia, right internal jugular micropuncture venous access was performed with ultrasound. Images were obtained for documentation of the patent right internal jugular vein. A guide wire was inserted followed by a transitional dilator. Next, a 0.035 guidewire was advanced into the IVC with a 5-French catheter. Measurements were obtained from the right venotomy site to  the proximal right atrium. In the right infraclavicular chest, a subcutaneous tunnel was created under sterile conditions and local anesthesia. 1% lidocaine with epinephrine was utilized for this. The 23 cm tip to cuff palindrome catheter was tunneled subcutaneously to the venotomy site and inserted into the SVC/RA junction through a valved peel-away sheath. Position was confirmed with fluoroscopy. Images were obtained for documentation. Blood was aspirated from the catheter followed by saline and heparin flushes. The appropriate volume and strength of heparin was instilled in each lumen. Caps were applied. The catheter was secured at the tunnel site with Gelfoam and a pursestring suture. The venotomy site was closed with subcuticular Vicryl suture. Dermabond was applied to the small right neck incision. A dry sterile dressing was applied. The catheter is ready for use. No immediate complications. IMPRESSION: Ultrasound and fluoroscopically guided right internal jugular tunneled hemodialysis catheter (23 cm tip to cuff palindrome catheter). Electronically Signed   By: Judie Petit.  Shick M.D.   On: 08/31/2023 12:00    Disposition Plan & Communication  Patient status: Inpatient  Admitted From: Home Planned disposition location: Home   Family Communication: none at bedside    Author: Joseph Art, DO [facility] 08/31/2023, 12:07 PM   Available by Epic secure chat 7AM-7PM. If 7PM-7AM, please contact night-coverage.  [facility] contact information found on [email or web address].

## 2023-09-01 ENCOUNTER — Other Ambulatory Visit: Payer: Self-pay

## 2023-09-01 DIAGNOSIS — R0902 Hypoxemia: Secondary | ICD-10-CM | POA: Diagnosis not present

## 2023-09-01 DIAGNOSIS — N185 Chronic kidney disease, stage 5: Secondary | ICD-10-CM | POA: Diagnosis not present

## 2023-09-01 LAB — BASIC METABOLIC PANEL
Anion gap: 15 (ref 5–15)
BUN: 44 mg/dL — ABNORMAL HIGH (ref 8–23)
CO2: 22 mmol/L (ref 22–32)
Calcium: 8.7 mg/dL — ABNORMAL LOW (ref 8.9–10.3)
Chloride: 102 mmol/L (ref 98–111)
Creatinine, Ser: 5.27 mg/dL — ABNORMAL HIGH (ref 0.44–1.00)
GFR, Estimated: 9 mL/min — ABNORMAL LOW (ref >60)
Glucose, Bld: 73 mg/dL (ref 70–99)
Potassium: 3.8 mmol/L (ref 3.5–5.1)
Sodium: 139 mmol/L (ref 135–145)

## 2023-09-01 LAB — GLUCOSE, CAPILLARY
Glucose-Capillary: 73 mg/dL (ref 70–99)
Glucose-Capillary: 79 mg/dL (ref 70–99)
Glucose-Capillary: 89 mg/dL (ref 70–99)
Glucose-Capillary: 82 mg/dL (ref 70–99)

## 2023-09-01 LAB — CBC
HCT: 29.2 % — ABNORMAL LOW (ref 36.0–46.0)
Hemoglobin: 9.1 g/dL — ABNORMAL LOW (ref 12.0–15.0)
MCH: 32.3 pg (ref 26.0–34.0)
MCHC: 31.2 g/dL (ref 30.0–36.0)
MCV: 103.5 fL — ABNORMAL HIGH (ref 80.0–100.0)
Platelets: 263 10*3/uL (ref 150–400)
RBC: 2.82 MIL/uL — ABNORMAL LOW (ref 3.87–5.11)
RDW: 15.6 % — ABNORMAL HIGH (ref 11.5–15.5)
WBC: 6.3 10*3/uL (ref 4.0–10.5)
nRBC: 0.3 % — ABNORMAL HIGH (ref 0.0–0.2)

## 2023-09-01 LAB — HEPATITIS B SURFACE ANTIBODY, QUANTITATIVE: Hep B S AB Quant (Post): <3.5 m[IU]/mL — ABNORMAL LOW

## 2023-09-01 MED ORDER — HYDRALAZINE HCL 50 MG PO TABS
50.0000 mg | ORAL_TABLET | Freq: Three times a day (TID) | ORAL | Status: DC
  Administered 2023-09-01 – 2023-09-04 (×10): 50 mg via ORAL
  Filled 2023-09-01 (×11): qty 1

## 2023-09-01 MED ORDER — METOPROLOL TARTRATE 5 MG/5ML IV SOLN
5.0000 mg | INTRAVENOUS | Status: DC | PRN
  Administered 2023-09-02: 5 mg via INTRAVENOUS
  Filled 2023-09-01: qty 5

## 2023-09-01 NOTE — Consult Note (Addendum)
 CONSULT NOTE   MRN : [id number]  Reason for Consult: Access needs Referring Physician: Dr. Thedore Mins Nephrology  History of Present Illness: Erin Davila is a 63 y.o. female status post left brachiocephalic arteriovenous fistula creation 05/06/23. She was last seen in our office by Dr. Lenell Antu.  The duplex shows a deep fistula that has not yet matured from a flow standpoint. The diameter is sufficient. There is a competing branch.  He recommended   superficialization and sidebranch ligation.  At this visit she was not on HD.    She presented to the ED with SOB.  AKI on CKD IR placed a TDC and she has now been started on HD.  We have been consulted to revise the fistula so that her permanent access can be used for HD.     Current Facility-Administered Medications  Medication Dose Route Frequency Provider Last Rate Last Admin   acetaminophen (TYLENOL) tablet 650 mg  650 mg Oral Q6H PRN Leeroy Bock, MD   650 mg at 08/31/23 2117   Or   acetaminophen (TYLENOL) suppository 650 mg  650 mg Rectal Q6H PRN Leeroy Bock, MD       amLODipine (NORVASC) tablet 10 mg  10 mg Oral QHS Leeroy Bock, MD   10 mg at 08/31/23 2114   atorvastatin (LIPITOR) tablet 20 mg  20 mg Oral QHS Winfield Rast, RPH   20 mg at 08/31/23 2115   bisacodyl (DULCOLAX) EC tablet 5 mg  5 mg Oral Daily PRN Leeroy Bock, MD       calcitRIOL (ROCALTROL) capsule 0.5 mcg  0.5 mcg Oral Daily Leeroy Bock, MD   0.5 mcg at 09/01/23 0831   carvedilol (COREG) tablet 6.25 mg  6.25 mg Oral BID WC Leeroy Bock, MD   6.25 mg at 09/01/23 0831   Chlorhexidine Gluconate Cloth 2 % PADS 6 each  6 each Topical Q0600 Delano Metz, MD   6 each at 09/01/23 0531   dextrose 50 % solution 50 mL  1 ampule Intravenous Daily PRN Marlin Canary U, DO       furosemide (LASIX) 160 mg in dextrose 5 % 50 mL IVPB  160 mg Intravenous Q8H Delano Metz, MD 66 mL/hr at 09/01/23 0523 160 mg at 09/01/23 0523    heparin injection 5,000 Units  5,000 Units Subcutaneous Q8H Leeroy Bock, MD   5,000 Units at 09/01/23 7829   hydrALAZINE (APRESOLINE) tablet 50 mg  50 mg Oral Q8H Crosley, Debby, MD   50 mg at 09/01/23 0347   insulin aspart (novoLOG) injection 0-15 Units  0-15 Units Subcutaneous TID WC Marlin Canary U, DO   3 Units at 08/30/23 1623   insulin aspart (novoLOG) injection 0-5 Units  0-5 Units Subcutaneous QHS Vann, Jessica U, DO       insulin glargine (LANTUS) injection 20 Units  20 Units Subcutaneous Daily Vann, Jessica U, DO   20 Units at 09/01/23 0830   metoprolol tartrate (LOPRESSOR) injection 5 mg  5 mg Intravenous Q4H PRN Crosley, Debby, MD       montelukast (SINGULAIR) tablet 10 mg  10 mg Oral QHS Anthoney Harada, NP   10 mg at 08/31/23 2114   polyethylene glycol (MIRALAX / GLYCOLAX) packet 17 g  17 g Oral Daily Leeroy Bock, MD   17 g at 08/28/23 1036    Pt meds include: Statin :Yes Betablocker: Yes ASA: No Other anticoagulants/antiplatelets: Heparin SQ  Past  Medical History:  Diagnosis Date   Allergy    Anemia of chronic kidney failure    stage 5   Diabetes mellitus without complication (HCC)    type 2   GERD (gastroesophageal reflux disease)    History of iron deficiency anemia    Hyperlipidemia    Hypertension     Past Surgical History:  Procedure Laterality Date   AV FISTULA PLACEMENT Left 05/06/2023   Procedure: LEFT ARM RADIOCEPHALIC ARTERIOVENOUS (AV) FISTULA CREATION;  Surgeon: [name], MD;  Location: [facility] OR;  Service: Vascular;  Laterality: Left;   CESAREAN SECTION     x 1   INCISION AND DRAINAGE ABSCESS N/A 01/04/2021   Procedure: INCISION AND DRAINAGE LEFT BUTTOCK ABSCESS;  Surgeon: Luretha Murphy, MD;  Location: [facility];  Service: General;  Laterality: N/A;   IR FLUORO GUIDE CV LINE RIGHT  08/31/2023   IR US GUIDE VASC ACCESS RIGHT  08/31/2023    Social History Social History   Tobacco Use   Smoking status: Never   Smokeless tobacco:  Never  Vaping Use   Vaping status: Never Used  Substance Use Topics   Alcohol use: No   Drug use: No    Family History History reviewed. No pertinent family history.  Allergies  Allergen Reactions   Lactose Other (See Comments)    GI complaints     REVIEW OF SYSTEMS  General: [ ]  Weight loss, [ ]  Fever, [ ]  chills Neurologic: [ ]  Dizziness, [ ]  Blackouts, [ ]  Seizure [ ]  Stroke, [ ]  "Mini stroke", [ ]  Slurred speech, [ ]  Temporary blindness; [ ]  weakness in arms or legs, [ ]  Hoarseness [ ]  Dysphagia Cardiac: [ ]  Chest pain/pressure, [ ]  Shortness of breath at rest [ ]  Shortness of breath with exertion, [ ]  Atrial fibrillation or irregular heartbeat  Vascular: [ ]  Pain in legs with walking, [ ]  Pain in legs at rest, [ ]  Pain in legs at night,  [ ]  Non-healing ulcer, [ ]  Blood clot in vein/DVT,   Pulmonary: [ ]  Home oxygen, [ ]  Productive cough, [ ]  Coughing up blood, [ ]  Asthma,  [ ]  Wheezing [ ]  COPD Musculoskeletal:  [ ]  Arthritis, [ ]  Low back pain, [ ]  Joint pain Hematologic: [ ]  Easy Bruising, [ ]  Anemia; [ ]  Hepatitis Gastrointestinal: [ ]  Blood in stool, [ ]  Gastroesophageal Reflux/heartburn, Urinary: [ ]  chronic Kidney disease, [x ] on HD - [ ]  MWF or [ ]  TTHS, [ ]  Burning with urination, [ ]  Difficulty urinating Skin: [ ]  Rashes, [ ]  Wounds Psychological: [ ]  Anxiety, [ ]  Depression  Physical Examination Vitals:   09/01/23 0343 09/01/23 0531 09/01/23 0800 09/01/23 0804  BP: (!) 167/66  (!) 183/65 (!) 165/56  Pulse: 77  84 83  Resp:   20 20  Temp: 98.4 F (36.9 C)  99 F (37.2 C) 99 F (37.2 C)  TempSrc: Oral   Oral  SpO2: 98%  92% 95%  Weight:  (!) 184.7 kg    Height:       Body mass index is 61.91 kg/m.  General:  WDWN in NAD  HENT: WNL Eyes: Pupils equal Pulmonary: normal non-labored breathing , without Rales, rhonchi,  wheezing Cardiac: RRR, without  Murmurs, rubs or gallops; No carotid bruits Abdomen: soft, NT, no masses Skin: no rashes, ulcers  noted;  no Gangrene , no cellulitis; no open wounds;   Vascular Exam/Pulses:palpable radial pulse, palpable distal thrill left UE  Musculoskeletal: no muscle wasting or atrophy; no edema  Neurologic: A&O X 3; Appropriate Affect ;  SENSATION: normal; MOTOR FUNCTION: 5/5 Symmetric Speech is fluent/normal   Significant Diagnostic Studies: CBC Lab Results  Component Value Date   WBC 6.3 09/01/2023   HGB 9.1 (L) 09/01/2023   HCT 29.2 (L) 09/01/2023   MCV 103.5 (H) 09/01/2023   PLT 263 09/01/2023    BMET    Component Value Date/Time   NA 139 09/01/2023 0840   K 3.8 09/01/2023 0840   CL 102 09/01/2023 0840   CO2 22 09/01/2023 0840   GLUCOSE 73 09/01/2023 0840   BUN 44 (H) 09/01/2023 0840   CREATININE 5.27 (H) 09/01/2023 0840   CALCIUM 8.7 (L) 09/01/2023 0840   GFRNONAA 9 (L) 09/01/2023 0840   Estimated Creatinine Clearance: 19.9 mL/min (A) (by C-G formula based on SCr of 5.27 mg/dL (H)).  COAG No results found for: "INR", "PROTIME"   Non-Invasive Vascular Imaging:  Performed on 06/10/24  Findings:  +--------------------+----------+-----------------+--------+  AVF                PSV (cm/s)Flow Vol (mL/min)Comments  +--------------------+----------+-----------------+--------+  Native artery inflow   205           460                 +--------------------+----------+-----------------+--------+  AVF Anastomosis        829                               +--------------------+----------+-----------------+--------+     +------------+----------+-------------+----------+-------------------+  OUTFLOW VEINPSV (cm/s)Diameter (cm)Depth (cm)     Describe        +------------+----------+-------------+----------+-------------------+  Prox UA         79        0.59        2.81                        +------------+----------+-------------+----------+-------------------+  Mid UA          81        0.72        1.87    competing branch     +------------+----------+-------------+----------+-------------------+  Dist UA        161        1.09        0.41                        +------------+----------+-------------+----------+-------------------+  AC Fossa       494        0.92        0.34   partially-occlusive  +------------+----------+-------------+----------+-------------------+    ASSESSMENT/PLAN:  Left UE AV fistula with depth that is too deep to use with low flow volumes.   Plan for superficialization and branch ligation of the left UE AV fistula.  Plan OR tomorrow with Dr. Lenell Antu.   Mosetta Pigeon 09/01/2023 10:26 AM  I have independently interviewed and examined patient and agree with PA assessment and plan above.  There is a very strong thrill in the fistula most recent duplex demonstrated somewhat low flows that should resolve with ligation of side branches and superficialization.  This will be planned in the OR tomorrow with Dr. Lenell Antu and patient notified.  Anie Juniel C. Randie Heinz, MD [facility] Office: [phone] Pager: [phone]

## 2023-09-01 NOTE — Progress Notes (Signed)
 [facility] Progress Note    Assessment/ Plan:   AKI on CKD 5, now ESRD - b/l creatinine is 5.9 from oct 2024. Creat here 5.4 on admission in the setting of new-onset vol overload w/ pulm edema. UA w/ mild hematuria/ pyuria, [place] w/o obstruction. AKI due to CHF vs progression of CKD. Did not respond to 100 lasix IV tid and is making urine on Lasix 160mg  but still not enough. Creat rising.   -did not robustly respond to aggressive/high dose diuretics therefore IR consulted for [facility] placement-placed 2/24 (AVF not ready to use). Slow start protocol--Plan for HD#1 2/24-2/25. Plan for HD#2 tomorrow  -renal navigator informed for outpatient HD placement Volume overload/ acute hypoxic resp failure - 1st time w/ volume overload like this. . CXR showed vasc congestion and sig LE edema by exam. ECHO showed nl LV, mild RV failure. Seen by cardiology, appreciate assistance.  Will plan for dialysis to manage volume. Cont diuretics for now until stable on HD/with UF---long term plan is diuretics on off-HD days once a HD is more clear. Will d/c her metolazone for now HTN - poorly cont'd, cont meds and diuresis to get volume down. Will UF as tolerated with HD HD access - AVF surgery done in October 2024, still needs superficialization per last note in 06/2023 (Dr Lenell Antu).  [facility] informed to see if this can happen prior to discharge    Subjective:   Patient seen and examined bedside. Finished HD overnight, net UF 3L. She reports that her breathing and swelling are much better. Still has some degree of dysgeusia. Uop ~3.1L Weight 192.6->184.7 today   Objective:   BP (!) 165/56 (BP Location: Right Arm)   Pulse 83   Temp 99 F (37.2 C) (Oral)   Resp 20   Ht 5\' 8"  (1.727 m)   Wt (!) 184.7 kg   LMP 03/26/2015   SpO2 95%   BMI 61.91 kg/m   Intake/Output Summary (Last 24 hours) at 09/01/2023 1010 Last data filed at 09/01/2023 4132 Gross per 24 hour  Intake 850 ml  Output 6325 ml  Net -5475 ml    Weight change: 1.182 kg  Physical Exam: Gen: NAD CVS: RRR Resp: decreased breath sounds bibasilar, on RA now Abd: obese, soft, nt Ext: 2+ edema b/l LEs Neuro: awake, alert Dialysis access: LUE AVF +b/t, +TDC  Imaging: IR Fluoro Guide CV Line Right Result Date: 08/31/2023 INDICATION: End-stage renal disease, no current access for dialysis EXAM: ULTRASOUND GUIDANCE FOR VASCULAR ACCESS RIGHT INTERNAL JUGULAR PERMANENT HEMODIALYSIS CATHETER Date:  08/31/2023 08/31/2023 11:53 am Radiologist:  Judie Petit. Ruel Favors, MD Guidance:  Ultrasound and fluoroscopic FLUOROSCOPY: Fluoroscopy Time: 1 minutes 18 seconds (80 mGy). MEDICATIONS: Ancef 2 g within 1 hour of the procedure ANESTHESIA/SEDATION: Versed 0.5 mg IV; Fentanyl 25 mcg IV; Moderate Sedation Time:  15 minute The patient was continuously monitored during the procedure by the interventional radiology nurse under my direct supervision. CONTRAST:  None. COMPLICATIONS: None immediate. PROCEDURE: Informed consent was obtained from the patient following explanation of the procedure, risks, benefits and alternatives. The patient understands, agrees and consents for the procedure. All questions were addressed. A time out was performed. Maximal barrier sterile technique utilized including caps, mask, sterile gowns, sterile gloves, large sterile drape, hand hygiene, and 2% chlorhexidine scrub. Under sterile conditions and local anesthesia, right internal jugular micropuncture venous access was performed with ultrasound. Images were obtained for documentation of the patent right internal jugular vein. A guide wire was inserted followed  by a transitional dilator. Next, a 0.035 guidewire was advanced into the IVC with a 5-French catheter. Measurements were obtained from the right venotomy site to the proximal right atrium. In the right infraclavicular chest, a subcutaneous tunnel was created under sterile conditions and local anesthesia. 1% lidocaine with epinephrine was  utilized for this. The 23 cm tip to cuff palindrome catheter was tunneled subcutaneously to the venotomy site and inserted into the SVC/RA junction through a valved peel-away sheath. Position was confirmed with fluoroscopy. Images were obtained for documentation. Blood was aspirated from the catheter followed by saline and heparin flushes. The appropriate volume and strength of heparin was instilled in each lumen. Caps were applied. The catheter was secured at the tunnel site with Gelfoam and a pursestring suture. The venotomy site was closed with subcuticular Vicryl suture. Dermabond was applied to the small right neck incision. A dry sterile dressing was applied. The catheter is ready for use. No immediate complications. IMPRESSION: Ultrasound and fluoroscopically guided right internal jugular tunneled hemodialysis catheter (23 cm tip to cuff palindrome catheter). Electronically Signed   By: Judie Petit.  Shick M.D.   On: 08/31/2023 12:00   IR US Guide Vasc Access Right Result Date: 08/31/2023 INDICATION: End-stage renal disease, no current access for dialysis EXAM: ULTRASOUND GUIDANCE FOR VASCULAR ACCESS RIGHT INTERNAL JUGULAR PERMANENT HEMODIALYSIS CATHETER Date:  08/31/2023 08/31/2023 11:53 am Radiologist:  M. Ruel Favors, MD Guidance:  Ultrasound and fluoroscopic FLUOROSCOPY: Fluoroscopy Time: 1 minutes 18 seconds (80 mGy). MEDICATIONS: Ancef 2 g within 1 hour of the procedure ANESTHESIA/SEDATION: Versed 0.5 mg IV; Fentanyl 25 mcg IV; Moderate Sedation Time:  15 minute The patient was continuously monitored during the procedure by the interventional radiology nurse under my direct supervision. CONTRAST:  None. COMPLICATIONS: None immediate. PROCEDURE: Informed consent was obtained from the patient following explanation of the procedure, risks, benefits and alternatives. The patient understands, agrees and consents for the procedure. All questions were addressed. A time out was performed. Maximal barrier sterile  technique utilized including caps, mask, sterile gowns, sterile gloves, large sterile drape, hand hygiene, and 2% chlorhexidine scrub. Under sterile conditions and local anesthesia, right internal jugular micropuncture venous access was performed with ultrasound. Images were obtained for documentation of the patent right internal jugular vein. A guide wire was inserted followed by a transitional dilator. Next, a 0.035 guidewire was advanced into the IVC with a 5-French catheter. Measurements were obtained from the right venotomy site to the proximal right atrium. In the right infraclavicular chest, a subcutaneous tunnel was created under sterile conditions and local anesthesia. 1% lidocaine with epinephrine was utilized for this. The 23 cm tip to cuff palindrome catheter was tunneled subcutaneously to the venotomy site and inserted into the SVC/RA junction through a valved peel-away sheath. Position was confirmed with fluoroscopy. Images were obtained for documentation. Blood was aspirated from the catheter followed by saline and heparin flushes. The appropriate volume and strength of heparin was instilled in each lumen. Caps were applied. The catheter was secured at the tunnel site with Gelfoam and a pursestring suture. The venotomy site was closed with subcuticular Vicryl suture. Dermabond was applied to the small right neck incision. A dry sterile dressing was applied. The catheter is ready for use. No immediate complications. IMPRESSION: Ultrasound and fluoroscopically guided right internal jugular tunneled hemodialysis catheter (23 cm tip to cuff palindrome catheter). Electronically Signed   By: Judie Petit.  Shick M.D.   On: 08/31/2023 12:00    Labs: [employer] Recent Labs  Lab 08/26/23  1025 08/26/23 1502 08/27/23 0601 08/28/23 0555 08/29/23 0248 08/30/23 0252  NA 144  --  142 138 142 137  K 4.6  --  5.1 5.1 5.0 4.8  CL 115*  --  112* 108 106 106  CO2 20*  --  19* 19* 19* 18*  GLUCOSE 131*  --  113* 98 78 88   BUN 57*  --  57* 66* 64* 70*  CREATININE 5.31* 5.45* 6.11* 6.58* 6.86* 7.23*  CALCIUM 8.7*  --  8.8* 8.5* 9.5 8.8*  PHOS  --   --   --  5.7* 5.9* 5.8*   CBC Recent Labs  Lab 08/26/23 1502 08/27/23 0601 08/30/23 0252 09/01/23 0840  WBC 6.6 7.8 8.7 6.3  HGB 8.4* 8.9* 7.9* 9.1*  HCT 28.7* 32.3* 26.9* 29.2*  MCV 109.5* 116.2* 108.5* 103.5*  PLT 243 299 230 263    Medications:     amLODipine  10 mg Oral QHS   atorvastatin  20 mg Oral QHS   calcitRIOL  0.5 mcg Oral Daily   carvedilol  6.25 mg Oral BID WC   Chlorhexidine Gluconate Cloth  6 each Topical Q0600   heparin  5,000 Units Subcutaneous Q8H   hydrALAZINE  50 mg Oral Q8H   insulin aspart  0-15 Units Subcutaneous TID WC   insulin aspart  0-5 Units Subcutaneous QHS   insulin glargine  20 Units Subcutaneous Daily   metolazone  5 mg Oral BID   montelukast  10 mg Oral QHS   polyethylene glycol  17 g Oral Daily      Anthony Sar, MD  [facility] 09/01/2023, 10:10 AM

## 2023-09-01 NOTE — Progress Notes (Signed)
   09/01/23 0205  Vitals  BP (!) 167/67  MAP (mmHg) 95  BP Location Right Arm  BP Method Automatic  Patient Position (if appropriate) Lying  Pulse Rate 77  Pulse Rate Source Monitor  ECG Heart Rate 77  Resp 15  Oxygen Therapy  SpO2 97 %  O2 Device Nasal Cannula  O2 Flow Rate (L/min) 2 L/min  During Treatment Monitoring  Blood Flow Rate (mL/min) 0 mL/min  Arterial Pressure (mmHg) -39.59 mmHg  Venous Pressure (mmHg) 55.15 mmHg  TMP (mmHg) 28.28 mmHg  Ultrafiltration Rate (mL/min) 1062 mL/min  Dialysate Flow Rate (mL/min) 299 ml/min  Dialysate Potassium Concentration 2  Dialysate Calcium Concentration 2.5  Duration of HD Treatment -hour(s) 3 hour(s)  Cumulative Fluid Removed (mL) per Treatment  3000.16  HD Safety Checks Performed Yes  Intra-Hemodialysis Comments Tx completed  Post Treatment  Dialyzer Clearance Lightly streaked  Liters Processed 45  Fluid Removed (mL) 3000 mL  Tolerated HD Treatment Yes  Post-Hemodialysis Comments pt stable  Fistula / Graft Left Upper arm Arteriovenous fistula  Placement Date/Time: 05/06/23 8119   Orientation: Left  Access Location: Upper arm  Access Type: Arteriovenous fistula  Site Condition No complications  Hemodialysis Catheter Right Internal jugular Double lumen Permanent (Tunneled)  Placement Date/Time: 08/31/23 1149   Placed prior to admission: No  Serial / Lot #: [id number]  Expiration Date: 12/05/27  Time Out: Correct patient;Correct procedure;Correct site  Maximum sterile barrier precautions: Hand hygiene;Cap;Mask;Sterile gown...  Site Condition No complications  Blue Lumen Status Flushed;Heparin locked  Red Lumen Status Flushed;Heparin locked  Catheter fill solution Heparin 1000 units/ml  Catheter fill volume (Arterial) 1.8 cc  Catheter fill volume (Venous) 1.8  Dressing Type Transparent  Dressing Status Antimicrobial disc/dressing in place  Interventions New dressing  Drainage Description None  Dressing Change Due 08/10/23   Post treatment catheter status Capped and Clamped   Zpt tolerated treatment well.

## 2023-09-01 NOTE — Plan of Care (Signed)
   Problem: Clinical Measurements: Goal: Ability to maintain clinical measurements within normal limits will improve Outcome: Progressing   Problem: Clinical Measurements: Goal: Will remain free from infection Outcome: Progressing   Problem: Clinical Measurements: Goal: Diagnostic test results will improve Outcome: Progressing

## 2023-09-01 NOTE — Progress Notes (Signed)
 Spoke to pt via phone. Reviewed out-pt HD options- in-center vs TCU. Pt voices interest in TCU. Referral submitted to [facility] admissions for review. Will await determination. Will assist as needed.   Olivia Canter Renal Navigator [phone]

## 2023-09-01 NOTE — Plan of Care (Signed)
 ?  Problem: Coping: ?Goal: Level of anxiety will decrease ?Outcome: Progressing ?  ?Problem: Safety: ?Goal: Ability to remain free from injury will improve ?Outcome: Progressing ?  ?

## 2023-09-01 NOTE — Progress Notes (Signed)
 PROGRESS NOTE  Erin Davila    DOB: [birth year], 63 y.o.  UJW:[id number]    Code Status: Full Code   DOA: 08/26/2023   LOS: 6    Brief hospital course  Erin Davila is a 63 y.o. 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 At baseline, independent with ADLs.   Presented from home to the ED on 08/26/2023 with SOB x a couple months intermittently but severely worsened today. She states that since October she has been working from home and more sedentary since then. She endorses lower extremity swelling during this time and thinks its due to sitting longer periods of time. She has intermittent shortness of breath which is worse with temperature changes so thinks the cold weather is what worsened it today.     Admitted to [facility] and transferred to [facility] for cardiology evaluation. Now starting HD.    Assessment & Plan  Principal Problem:   Hypoxia   Acute hypoxic respiratory failure-  -wean to RA as able  Anemia -most likely chronic disease and volume dilution -improved after HD  Acute on chronic Diastolic heart failure- EF 60-65%.  Left ventricular internal cavity moderately to severely dilated with mild left ventricular hypertrophy.  Diastolic parameters are indeterminate based off the echo read. 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 Appears to have roughly stable Cr from baseline on presentation.  - follow Cr closely with lasix use - nephrology consulted-- started HD 2/24 after temp catheter placed   Type II DM- takes ozempic at home.  -SSI -reduce lantus again   HLD- continue home simvastatin   GERD- continue home protonix  obesity Body mass index is 61.91 kg/m.  VTE ppx: heparin injection 5,000 Units Start: 08/26/23 1415    Consultants: Nephrology  cardiology  Subjective 09/01/23    Had HD overnight  and is tired   Objective   Vitals:   09/01/23 0531 09/01/23 0800 09/01/23 0804 09/01/23 1111  BP:  (!) 183/65 (!) 165/56 (!) 158/55  Pulse:  84 83 83  Resp:  20 20 16   Temp:  99 F (37.2 C) 99 F (37.2 C) 98.9 F (37.2 C)  TempSrc:   Oral Oral  SpO2:  92% 95% 95%  Weight: (!) 184.7 kg     Height:        Intake/Output Summary (Last 24 hours) at 09/01/2023 1313 Last data filed at 09/01/2023 1150 Gross per 24 hour  Intake 880 ml  Output 6725 ml  Net -5845 ml   Filed Weights   08/31/23 0457 08/31/23 2240 09/01/23 0531  Weight: (!) 191.4 kg (!) 192.6 kg (!) 184.7 kg      General: Appearance:    Severely obese female in no acute distress     Lungs:      respirations unlabored, diminished  Heart:    Normal heart rate.  MS:   All extremities are intact.   Neurologic:   Awake, alert     Labs   I have personally reviewed the following labs and imaging studies CBC    Component Value Date/Time   WBC 6.3 09/01/2023 0840   RBC 2.82 (L) 09/01/2023 0840   HGB 9.1 (L) 09/01/2023 0840   HCT 29.2 (L) 09/01/2023 0840   PLT 263 09/01/2023 0840   MCV 103.5 (H) 09/01/2023 0840  MCH 32.3 09/01/2023 0840   MCHC 31.2 09/01/2023 0840   RDW 15.6 (H) 09/01/2023 0840   LYMPHSABS 1.8 01/20/2021 0722   MONOABS 0.9 01/20/2021 0722   EOSABS 0.5 01/20/2021 0722   BASOSABS 0.0 01/20/2021 0722      Latest Ref Rng & Units 09/01/2023    8:40 AM 08/30/2023    2:52 AM 08/29/2023    2:48 AM  BMP  Glucose 70 - 99 mg/dL 73  88  78   BUN 8 - 23 mg/dL 44  70  64   Creatinine 0.44 - 1.00 mg/dL 2.13  0.86  5.78   Sodium 135 - 145 mmol/L 139  137  142   Potassium 3.5 - 5.1 mmol/L 3.8  4.8  5.0   Chloride 98 - 111 mmol/L 102  106  106   CO2 22 - 32 mmol/L 22  18  19    Calcium 8.9 - 10.3 mg/dL 8.7  8.8  9.5     IR Fluoro Guide CV Line Right Result Date: 08/31/2023 INDICATION: End-stage renal disease, no current access for dialysis EXAM: ULTRASOUND GUIDANCE FOR VASCULAR ACCESS RIGHT INTERNAL  JUGULAR PERMANENT HEMODIALYSIS CATHETER Date:  08/31/2023 08/31/2023 11:53 am Radiologist:  Judie Petit. Ruel Favors, MD Guidance:  Ultrasound and fluoroscopic FLUOROSCOPY: Fluoroscopy Time: 1 minutes 18 seconds (80 mGy). MEDICATIONS: Ancef 2 g within 1 hour of the procedure ANESTHESIA/SEDATION: Versed 0.5 mg IV; Fentanyl 25 mcg IV; Moderate Sedation Time:  15 minute The patient was continuously monitored during the procedure by the interventional radiology nurse under my direct supervision. CONTRAST:  None. COMPLICATIONS: None immediate. PROCEDURE: Informed consent was obtained from the patient following explanation of the procedure, risks, benefits and alternatives. The patient understands, agrees and consents for the procedure. All questions were addressed. A time out was performed. Maximal barrier sterile technique utilized including caps, mask, sterile gowns, sterile gloves, large sterile drape, hand hygiene, and 2% chlorhexidine scrub. Under sterile conditions and local anesthesia, right internal jugular micropuncture venous access was performed with ultrasound. Images were obtained for documentation of the patent right internal jugular vein. A guide wire was inserted followed by a transitional dilator. Next, a 0.035 guidewire was advanced into the IVC with a 5-French catheter. Measurements were obtained from the right venotomy site to the proximal right atrium. In the right infraclavicular chest, a subcutaneous tunnel was created under sterile conditions and local anesthesia. 1% lidocaine with epinephrine was utilized for this. The 23 cm tip to cuff palindrome catheter was tunneled subcutaneously to the venotomy site and inserted into the SVC/RA junction through a valved peel-away sheath. Position was confirmed with fluoroscopy. Images were obtained for documentation. Blood was aspirated from the catheter followed by saline and heparin flushes. The appropriate volume and strength of heparin was instilled in each lumen.  Caps were applied. The catheter was secured at the tunnel site with Gelfoam and a pursestring suture. The venotomy site was closed with subcuticular Vicryl suture. Dermabond was applied to the small right neck incision. A dry sterile dressing was applied. The catheter is ready for use. No immediate complications. IMPRESSION: Ultrasound and fluoroscopically guided right internal jugular tunneled hemodialysis catheter (23 cm tip to cuff palindrome catheter). Electronically Signed   By: Judie Petit.  Shick M.D.   On: 08/31/2023 12:00   IR US Guide Vasc Access Right Result Date: 08/31/2023 INDICATION: End-stage renal disease, no current access for dialysis EXAM: ULTRASOUND GUIDANCE FOR VASCULAR ACCESS RIGHT INTERNAL JUGULAR PERMANENT HEMODIALYSIS CATHETER Date:  08/31/2023 08/31/2023 11:53 am  Radiologist:  Judie Petit. Ruel Favors, MD Guidance:  Ultrasound and fluoroscopic FLUOROSCOPY: Fluoroscopy Time: 1 minutes 18 seconds (80 mGy). MEDICATIONS: Ancef 2 g within 1 hour of the procedure ANESTHESIA/SEDATION: Versed 0.5 mg IV; Fentanyl 25 mcg IV; Moderate Sedation Time:  15 minute The patient was continuously monitored during the procedure by the interventional radiology nurse under my direct supervision. CONTRAST:  None. COMPLICATIONS: None immediate. PROCEDURE: Informed consent was obtained from the patient following explanation of the procedure, risks, benefits and alternatives. The patient understands, agrees and consents for the procedure. All questions were addressed. A time out was performed. Maximal barrier sterile technique utilized including caps, mask, sterile gowns, sterile gloves, large sterile drape, hand hygiene, and 2% chlorhexidine scrub. Under sterile conditions and local anesthesia, right internal jugular micropuncture venous access was performed with ultrasound. Images were obtained for documentation of the patent right internal jugular vein. A guide wire was inserted followed by a transitional dilator. Next, a 0.035  guidewire was advanced into the IVC with a 5-French catheter. Measurements were obtained from the right venotomy site to the proximal right atrium. In the right infraclavicular chest, a subcutaneous tunnel was created under sterile conditions and local anesthesia. 1% lidocaine with epinephrine was utilized for this. The 23 cm tip to cuff palindrome catheter was tunneled subcutaneously to the venotomy site and inserted into the SVC/RA junction through a valved peel-away sheath. Position was confirmed with fluoroscopy. Images were obtained for documentation. Blood was aspirated from the catheter followed by saline and heparin flushes. The appropriate volume and strength of heparin was instilled in each lumen. Caps were applied. The catheter was secured at the tunnel site with Gelfoam and a pursestring suture. The venotomy site was closed with subcuticular Vicryl suture. Dermabond was applied to the small right neck incision. A dry sterile dressing was applied. The catheter is ready for use. No immediate complications. IMPRESSION: Ultrasound and fluoroscopically guided right internal jugular tunneled hemodialysis catheter (23 cm tip to cuff palindrome catheter). Electronically Signed   By: Judie Petit.  Shick M.D.   On: 08/31/2023 12:00    Disposition Plan & Communication  Patient status: Inpatient  Admitted From: Home Planned disposition location: Home   Family Communication: none at bedside     Joseph Art, DO [facility] 09/01/2023, 1:13 PM   Available by Epic secure chat 7AM-7PM. If 7PM-7AM, please contact night-coverage.  [facility] contact information found on [email or web address].

## 2023-09-02 ENCOUNTER — Other Ambulatory Visit: Payer: Self-pay

## 2023-09-02 ENCOUNTER — Encounter (HOSPITAL_COMMUNITY)
Admission: EM | Disposition: A | Discharge status: Home / Self Care | Payer: Self-pay | Source: Home / Self Care | Attending: Internal Medicine

## 2023-09-02 ENCOUNTER — Inpatient Hospital Stay (HOSPITAL_COMMUNITY): Payer: 59

## 2023-09-02 DIAGNOSIS — N186 End stage renal disease: Secondary | ICD-10-CM | POA: Diagnosis not present

## 2023-09-02 DIAGNOSIS — I130 Hypertensive heart and chronic kidney disease with heart failure and stage 1 through stage 4 chronic kidney disease, or unspecified chronic kidney disease: Secondary | ICD-10-CM | POA: Diagnosis not present

## 2023-09-02 DIAGNOSIS — I509 Heart failure, unspecified: Secondary | ICD-10-CM

## 2023-09-02 DIAGNOSIS — E1122 Type 2 diabetes mellitus with diabetic chronic kidney disease: Secondary | ICD-10-CM

## 2023-09-02 DIAGNOSIS — N185 Chronic kidney disease, stage 5: Secondary | ICD-10-CM | POA: Diagnosis not present

## 2023-09-02 DIAGNOSIS — R0902 Hypoxemia: Secondary | ICD-10-CM | POA: Diagnosis not present

## 2023-09-02 HISTORY — PX: REVISON OF ARTERIOVENOUS FISTULA: SHX6074

## 2023-09-02 LAB — RENAL FUNCTION PANEL
Albumin: 3 g/dL — ABNORMAL LOW (ref 3.5–5.0)
Anion gap: 15 (ref 5–15)
BUN: 47 mg/dL — ABNORMAL HIGH (ref 8–23)
CO2: 23 mmol/L (ref 22–32)
Calcium: 8.7 mg/dL — ABNORMAL LOW (ref 8.9–10.3)
Chloride: 99 mmol/L (ref 98–111)
Creatinine, Ser: 6.04 mg/dL — ABNORMAL HIGH (ref 0.44–1.00)
GFR, Estimated: 7 mL/min — ABNORMAL LOW (ref >60)
Glucose, Bld: 71 mg/dL (ref 70–99)
Phosphorus: 5 mg/dL — ABNORMAL HIGH (ref 2.5–4.6)
Potassium: 3.4 mmol/L — ABNORMAL LOW (ref 3.5–5.1)
Sodium: 137 mmol/L (ref 135–145)

## 2023-09-02 LAB — GLUCOSE, CAPILLARY
Glucose-Capillary: 82 mg/dL (ref 70–99)
Glucose-Capillary: 71 mg/dL (ref 70–99)
Glucose-Capillary: 77 mg/dL (ref 70–99)
Glucose-Capillary: 79 mg/dL (ref 70–99)
Glucose-Capillary: 105 mg/dL — ABNORMAL HIGH (ref 70–99)

## 2023-09-02 SURGERY — REVISON OF ARTERIOVENOUS FISTULA
Anesthesia: General | Laterality: Left

## 2023-09-02 MED ORDER — CHLORHEXIDINE GLUCONATE 0.12 % MT SOLN
15.0000 mL | Freq: Once | OROMUCOSAL | Status: DC
Start: 2023-09-02 — End: 2023-09-02

## 2023-09-02 MED ORDER — ROCURONIUM BROMIDE 10 MG/ML (PF) SYRINGE
PREFILLED_SYRINGE | INTRAVENOUS | Status: DC | PRN
  Administered 2023-09-02: 30 mg via INTRAVENOUS

## 2023-09-02 MED ORDER — FENTANYL CITRATE (PF) 100 MCG/2ML IJ SOLN
INTRAMUSCULAR | Status: AC
  Filled 2023-09-02: qty 2

## 2023-09-02 MED ORDER — FENTANYL CITRATE (PF) 100 MCG/2ML IJ SOLN
INTRAMUSCULAR | Status: AC
  Administered 2023-09-02: 50 ug via INTRAVENOUS
  Filled 2023-09-02: qty 2

## 2023-09-02 MED ORDER — OXYCODONE HCL 5 MG PO TABS: 5.0000 mg | ORAL_TABLET | Freq: Once | ORAL | Status: DC | PRN

## 2023-09-02 MED ORDER — FENTANYL CITRATE (PF) 100 MCG/2ML IJ SOLN
INTRAMUSCULAR | Status: DC | PRN
  Administered 2023-09-02: 100 ug via INTRAVENOUS

## 2023-09-02 MED ORDER — PAPAVERINE HCL 30 MG/ML IJ SOLN
INTRAMUSCULAR | Status: AC
  Filled 2023-09-02: qty 2

## 2023-09-02 MED ORDER — DEXAMETHASONE SODIUM PHOSPHATE 10 MG/ML IJ SOLN
INTRAMUSCULAR | Status: DC | PRN
  Administered 2023-09-02: 4 mg via INTRAVENOUS

## 2023-09-02 MED ORDER — SUCCINYLCHOLINE CHLORIDE 200 MG/10ML IV SOSY
PREFILLED_SYRINGE | INTRAVENOUS | Status: DC | PRN
  Administered 2023-09-02: 200 mg via INTRAVENOUS

## 2023-09-02 MED ORDER — LIDOCAINE 2% (20 MG/ML) 5 ML SYRINGE
INTRAMUSCULAR | Status: DC | PRN
Start: 2023-09-02 — End: 2023-09-02
  Administered 2023-09-02: 100 mg via INTRAVENOUS

## 2023-09-02 MED ORDER — ACETAMINOPHEN 10 MG/ML IV SOLN: 1000.0000 mg | Freq: Once | INTRAVENOUS | Status: DC | PRN

## 2023-09-02 MED ORDER — OXYCODONE-ACETAMINOPHEN 5-325 MG PO TABS
1.0000 | ORAL_TABLET | Freq: Four times a day (QID) | ORAL | Status: DC | PRN
  Administered 2023-09-03 (×3): 1 via ORAL
  Filled 2023-09-02 (×4): qty 1

## 2023-09-02 MED ORDER — LIDOCAINE-EPINEPHRINE (PF) 1 %-1:200000 IJ SOLN
INTRAMUSCULAR | Status: AC
Start: 2023-09-02 — End: ?
  Filled 2023-09-02: qty 30

## 2023-09-02 MED ORDER — HEPARIN SODIUM (PORCINE) 1000 UNIT/ML IJ SOLN
4000.0000 [IU] | INTRAMUSCULAR | Status: DC | PRN
  Administered 2023-09-02 – 2023-09-04 (×2): 4000 [IU] via INTRAVENOUS
  Filled 2023-09-02 (×2): qty 4

## 2023-09-02 MED ORDER — HEPARIN 6000 UNIT IRRIGATION SOLUTION
Status: AC
  Filled 2023-09-02: qty 500

## 2023-09-02 MED ORDER — PHENYLEPHRINE 80 MCG/ML (10ML) SYRINGE FOR IV PUSH (FOR BLOOD PRESSURE SUPPORT)
PREFILLED_SYRINGE | INTRAVENOUS | Status: AC
  Filled 2023-09-02: qty 10

## 2023-09-02 MED ORDER — ORAL CARE MOUTH RINSE: 15.0000 mL | Freq: Once | OROMUCOSAL | Status: DC

## 2023-09-02 MED ORDER — ONDANSETRON HCL 4 MG/2ML IJ SOLN
INTRAMUSCULAR | Status: AC
  Filled 2023-09-02: qty 2

## 2023-09-02 MED ORDER — 0.9 % SODIUM CHLORIDE (POUR BTL) OPTIME
TOPICAL | Status: DC | PRN
  Administered 2023-09-02: 1000 mL

## 2023-09-02 MED ORDER — MIDAZOLAM HCL 2 MG/2ML IJ SOLN
INTRAMUSCULAR | Status: AC
  Filled 2023-09-02: qty 2

## 2023-09-02 MED ORDER — PROPOFOL 10 MG/ML IV BOLUS
INTRAVENOUS | Status: DC | PRN
  Administered 2023-09-02: 150 mg via INTRAVENOUS

## 2023-09-02 MED ORDER — SODIUM CHLORIDE 0.9 % IV SOLN: INTRAVENOUS | Status: DC

## 2023-09-02 MED ORDER — BOOST / RESOURCE BREEZE PO LIQD CUSTOM
1.0000 | Freq: Two times a day (BID) | ORAL | Status: DC
  Administered 2023-09-02: 1 via ORAL

## 2023-09-02 MED ORDER — FENTANYL CITRATE (PF) 100 MCG/2ML IJ SOLN
25.0000 ug | INTRAMUSCULAR | Status: DC | PRN
  Administered 2023-09-02: 50 ug via INTRAVENOUS

## 2023-09-02 MED ORDER — FENTANYL CITRATE (PF) 250 MCG/5ML IJ SOLN
INTRAMUSCULAR | Status: AC
  Filled 2023-09-02: qty 5

## 2023-09-02 MED ORDER — ONDANSETRON HCL 4 MG/2ML IJ SOLN: 4.0000 mg | Freq: Once | INTRAMUSCULAR | Status: DC | PRN

## 2023-09-02 MED ORDER — RENA-VITE PO TABS
1.0000 | ORAL_TABLET | Freq: Every day | ORAL | Status: DC
  Administered 2023-09-03 (×2): 1 via ORAL
  Filled 2023-09-02 (×2): qty 1

## 2023-09-02 MED ORDER — HEPARIN 6000 UNIT IRRIGATION SOLUTION
Status: DC | PRN
  Administered 2023-09-02: 1

## 2023-09-02 MED ORDER — SUGAMMADEX SODIUM 200 MG/2ML IV SOLN
INTRAVENOUS | Status: DC | PRN
  Administered 2023-09-02: 100 mg via INTRAVENOUS
  Administered 2023-09-02: 300 mg via INTRAVENOUS

## 2023-09-02 MED ORDER — CEFAZOLIN SODIUM-DEXTROSE 2-3 GM-%(50ML) IV SOLR
INTRAVENOUS | Status: DC | PRN
  Administered 2023-09-02: 3 g via INTRAVENOUS

## 2023-09-02 MED ORDER — LIDOCAINE 2% (20 MG/ML) 5 ML SYRINGE
INTRAMUSCULAR | Status: AC
  Filled 2023-09-02: qty 5

## 2023-09-02 MED ORDER — FENTANYL CITRATE (PF) 100 MCG/2ML IJ SOLN: 50.0000 ug | Freq: Once | INTRAMUSCULAR | Status: AC

## 2023-09-02 MED ORDER — HEPARIN SODIUM (PORCINE) 1000 UNIT/ML IJ SOLN
INTRAMUSCULAR | Status: DC | PRN
  Administered 2023-09-02: 10000 [IU] via INTRAVENOUS

## 2023-09-02 MED ORDER — ONDANSETRON HCL 4 MG/2ML IJ SOLN
INTRAMUSCULAR | Status: DC | PRN
  Administered 2023-09-02: 4 mg via INTRAVENOUS

## 2023-09-02 MED ORDER — CHLORHEXIDINE GLUCONATE 0.12 % MT SOLN
OROMUCOSAL | Status: AC
Start: 2023-09-02 — End: 2023-09-03
  Filled 2023-09-02: qty 15

## 2023-09-02 MED ORDER — MEPIVACAINE HCL (PF) 2 % IJ SOLN
INTRAMUSCULAR | Status: DC | PRN
  Administered 2023-09-02: 20 mL via EPIDURAL

## 2023-09-02 MED ORDER — OXYCODONE HCL 5 MG/5ML PO SOLN: 5.0000 mg | Freq: Once | ORAL | Status: DC | PRN

## 2023-09-02 SURGICAL SUPPLY — 36 items
ARMBAND PINK RESTRICT EXTREMIT (MISCELLANEOUS) ×1 IMPLANT
BAG COUNTER SPONGE SURGICOUNT (BAG) ×1 IMPLANT
BENZOIN TINCTURE PRP APPL 2/3 (GAUZE/BANDAGES/DRESSINGS) ×1 IMPLANT
CANISTER SUCT 3000ML PPV (MISCELLANEOUS) ×1 IMPLANT
CANNULA VESSEL 3MM 2 BLNT TIP (CANNULA) ×1 IMPLANT
CHLORAPREP W/TINT 26 (MISCELLANEOUS) ×1 IMPLANT
CLIP LIGATING EXTRA MED SLVR (CLIP) IMPLANT
CLIP LIGATING EXTRA SM BLUE (MISCELLANEOUS) IMPLANT
COVER PROBE W GEL 5X96 (DRAPES) IMPLANT
DRSG TEGADERM 4X4.75 (GAUZE/BANDAGES/DRESSINGS) IMPLANT
ELECT REM PT RETURN 9FT ADLT (ELECTROSURGICAL) ×1 IMPLANT
ELECTRODE REM PT RTRN 9FT ADLT (ELECTROSURGICAL) ×1 IMPLANT
GAUZE SPONGE 4X4 12PLY STRL (GAUZE/BANDAGES/DRESSINGS) IMPLANT
GLOVE BIO SURGEON STRL SZ8 (GLOVE) ×1 IMPLANT
GLOVE BIOGEL PI IND STRL 7.0 (GLOVE) IMPLANT
GLOVE INDICATOR 7.0 STRL GRN (GLOVE) IMPLANT
GOWN STRL REUS W/ TWL LRG LVL3 (GOWN DISPOSABLE) ×2 IMPLANT
GOWN STRL REUS W/ TWL XL LVL3 (GOWN DISPOSABLE) ×1 IMPLANT
INSERT FOGARTY SM (MISCELLANEOUS) IMPLANT
KIT BASIN OR (CUSTOM PROCEDURE TRAY) ×1 IMPLANT
KIT TURNOVER KIT B (KITS) ×1 IMPLANT
NDL 18GX1X1/2 (RX/OR ONLY) (NEEDLE) ×1 IMPLANT
NEEDLE 18GX1X1/2 (RX/OR ONLY) (NEEDLE) ×1 IMPLANT
NS IRRIG 1000ML POUR BTL (IV SOLUTION) ×1 IMPLANT
PACK CV ACCESS (CUSTOM PROCEDURE TRAY) ×1 IMPLANT
PAD ARMBOARD 7.5X6 YLW CONV (MISCELLANEOUS) ×2 IMPLANT
SLING ARM FOAM STRAP LRG (SOFTGOODS) IMPLANT
SLING ARM FOAM STRAP MED (SOFTGOODS) IMPLANT
STRIP CLOSURE SKIN 1/2X4 (GAUZE/BANDAGES/DRESSINGS) ×1 IMPLANT
SUT MNCRL AB 4-0 PS2 18 (SUTURE) ×1 IMPLANT
SUT PROLENE 6 0 BV (SUTURE) ×1 IMPLANT
SUT VIC AB 3-0 SH 27X BRD (SUTURE) ×1 IMPLANT
SYR 3ML LL SCALE MARK (SYRINGE) ×1 IMPLANT
TOWEL GREEN STERILE (TOWEL DISPOSABLE) ×1 IMPLANT
UNDERPAD 30X36 HEAVY ABSORB (UNDERPADS AND DIAPERS) ×1 IMPLANT
WATER STERILE IRR 1000ML POUR (IV SOLUTION) ×1 IMPLANT

## 2023-09-02 NOTE — Progress Notes (Signed)
 Initial Nutrition Assessment  DOCUMENTATION CODES:  Morbid obesity  INTERVENTION:  Once diet resumes, recommend: 2 gram sodium diet Snacks TID between meals (cheerios with 1 packet sugar, silk milk, fruit cup, sliced apples with peanut butter) Boost Breeze po BID, each supplement provides 250 kcal and 9 grams of protein Renal MVI with minerals daily "Nutrition for People on Dialysis" handout and "Making Sense of Phosphorus" handout provided and discussed with pt  NUTRITION DIAGNOSIS:  Inadequate oral intake related to poor appetite, acute illness as evidenced by per patient/family report.  GOAL:  Patient will meet greater than or equal to 90% of their needs  MONITOR:  PO intake, Supplement acceptance, Labs, Weight trends, I & O's  REASON FOR ASSESSMENT:  Consult Diet education (new HD education)  ASSESSMENT:  Pt admitted with worsening SOB. PMH significant for T2DM, HTN, morbid obesity, anemia, CKD.  TDC placed during admission as AV fistula not ready for use.  Pt is NPO for AV fistula superficialization and branch ligation.   Spoke with pt at bedside. She reports that she was eating at her baseline up until admission. During admission she reports that foods have been unappetizing and unappealing to her. She recalls sending back multiple trays.  She endorses lactose intolerance and will occasionally take a lactaid medication if she eats ice cream but this is rare.  At home her typical intake includes activia yogurt and an apple with peanut butter for breakfast. Lunch may be similar but depends on her work schedule. Dinner varies but usually includes chicken with rice and a vegetable.  She tries to avoid fried foods and adding salt to meals. Primarily drinks water but on occasion may consume a ginger ale.   Pt has been on ozempic about 10 months to 1 year. She denies noticeable changes such as decreased appetite. She does not consume protein supplements and is concerned about  trying any as she is worried this will cause her to have increased bowel movements. She is agreeable to trialing a [employer].   Meal completions: 2/20: 100% breakfast, 100% lunch, 50% dinner 2/24: 100% dinner 2/25: 30% lunch, 60% dinner  First iHD 2/24 Net UF 3L Next HD session planned for today  UOP: x24 hours  Admit weight: 181 kg Current weight: 181.8 kg Highest weight during admission noted to be 195.3 kg  Medications: calcitriol, SSI 0-15 units TID, SSI 0-5 units at bedtime, lantus 20 units daily, miralax daily, IV lasix 160mg  q8h  Labs:  BUN 44 Cr 5.27 GFR 9 CBG's 73-89 x 24 hours HgbA1c 4.6%  NUTRITION - FOCUSED PHYSICAL EXAM: No noted depletions. Mild generalized edema. Body habitus impeding ability to adequately assess for muscle deficits.   Diet Order:   Diet Order             Diet NPO time specified  Diet effective midnight                   EDUCATION NEEDS:  Education needs have been addressed  Skin:  Skin Assessment: Reviewed RN Assessment  Last BM:  2/24  Height:  Ht Readings from Last 1 Encounters:  09/02/23 5\' 8"  (1.727 m)    Weight:  Wt Readings from Last 1 Encounters:  09/02/23 (!) 181.4 kg    Ideal Body Weight:  63.6 kg  BMI:  Body mass index is 60.82 kg/m.  Estimated Nutritional Needs:   Kcal:  1700-1900  Protein:  95-110g  Fluid:  1L + UOP  Drusilla Kanner,  RDN, LDN Clinical Nutrition

## 2023-09-02 NOTE — Progress Notes (Signed)
 [facility] Progress Note    Assessment/ Plan:   AKI on CKD 5, now ESRD - b/l creatinine is 5.9 from oct 2024. Creat here 5.4 on admission in the setting of new-onset vol overload w/ pulm edema. UA w/ mild hematuria/ pyuria, [place] w/o obstruction. AKI due to CHF vs progression of CKD. Did not respond to 100 lasix IV tid and is making urine on Lasix 160mg  but still not enough. Creat rising.   -did not robustly respond to aggressive/high dose diuretics therefore IR consulted for [facility] placement-placed 2/24 (AVF not ready to use). Slow start protocol--Plan for HD#1 2/24-2/25. Plan for HD#2 today peri-OP.  -renal navigator informed for outpatient HD placement--patient prefers TCU Volume overload/ acute hypoxic resp failure - 1st time w/ volume overload like this. . CXR showed vasc congestion and sig LE edema by exam. ECHO showed nl LV, mild RV failure. Seen by cardiology, appreciate assistance.  Will plan for dialysis to manage volume. Cont diuretics for now until stable on HD/with UF---long term plan is diuretics on off-HD days once a HD is more clear (likely will do torsemide 60mg  BID on off-hd days). d/c'ed her metolazone for now HTN - poorly cont'd, cont meds and diuresis to get volume down. Will UF as tolerated with HD HD access - AVF surgery done in October 2024, still needs superficialization per last note in 06/2023 (Dr Lenell Antu).  VVS on board, OR planned for 2/26    Subjective:   Patient seen and examined bedside. No complaints, just feels thirst, currently NPO for OR today.   Objective:   BP (!) 164/71 (BP Location: Right Wrist)   Pulse 82   Temp 99 F (37.2 C) (Oral)   Resp (!) 24   Ht 5\' 8"  (1.727 m)   Wt (!) 181.8 kg   LMP 03/26/2015   SpO2 91%   BMI 60.94 kg/m   Intake/Output Summary (Last 24 hours) at 09/02/2023 0931 Last data filed at 09/02/2023 0319 Gross per 24 hour  Intake 90 ml  Output 3350 ml  Net -3260 ml   Weight change: -10.8 kg  Physical Exam: Gen:  NAD CVS: RRR Resp: decreased breath sounds bibasilar, normal wob Abd: obese, soft, nt Ext: 2+ edema b/l LEs Neuro: awake, alert Dialysis access: LUE AVF +b/t, +TDC  Imaging: IR Fluoro Guide CV Line Right Result Date: 08/31/2023 INDICATION: End-stage renal disease, no current access for dialysis EXAM: ULTRASOUND GUIDANCE FOR VASCULAR ACCESS RIGHT INTERNAL JUGULAR PERMANENT HEMODIALYSIS CATHETER Date:  08/31/2023 08/31/2023 11:53 am Radiologist:  Judie Petit. Ruel Favors, MD Guidance:  Ultrasound and fluoroscopic FLUOROSCOPY: Fluoroscopy Time: 1 minutes 18 seconds (80 mGy). MEDICATIONS: Ancef 2 g within 1 hour of the procedure ANESTHESIA/SEDATION: Versed 0.5 mg IV; Fentanyl 25 mcg IV; Moderate Sedation Time:  15 minute The patient was continuously monitored during the procedure by the interventional radiology nurse under my direct supervision. CONTRAST:  None. COMPLICATIONS: None immediate. PROCEDURE: Informed consent was obtained from the patient following explanation of the procedure, risks, benefits and alternatives. The patient understands, agrees and consents for the procedure. All questions were addressed. A time out was performed. Maximal barrier sterile technique utilized including caps, mask, sterile gowns, sterile gloves, large sterile drape, hand hygiene, and 2% chlorhexidine scrub. Under sterile conditions and local anesthesia, right internal jugular micropuncture venous access was performed with ultrasound. Images were obtained for documentation of the patent right internal jugular vein. A guide wire was inserted followed by a transitional dilator. Next, a 0.035 guidewire was advanced  into the IVC with a 5-French catheter. Measurements were obtained from the right venotomy site to the proximal right atrium. In the right infraclavicular chest, a subcutaneous tunnel was created under sterile conditions and local anesthesia. 1% lidocaine with epinephrine was utilized for this. The 23 cm tip to cuff palindrome  catheter was tunneled subcutaneously to the venotomy site and inserted into the SVC/RA junction through a valved peel-away sheath. Position was confirmed with fluoroscopy. Images were obtained for documentation. Blood was aspirated from the catheter followed by saline and heparin flushes. The appropriate volume and strength of heparin was instilled in each lumen. Caps were applied. The catheter was secured at the tunnel site with Gelfoam and a pursestring suture. The venotomy site was closed with subcuticular Vicryl suture. Dermabond was applied to the small right neck incision. A dry sterile dressing was applied. The catheter is ready for use. No immediate complications. IMPRESSION: Ultrasound and fluoroscopically guided right internal jugular tunneled hemodialysis catheter (23 cm tip to cuff palindrome catheter). Electronically Signed   By: Judie Petit.  Shick M.D.   On: 08/31/2023 12:00   IR US Guide Vasc Access Right Result Date: 08/31/2023 INDICATION: End-stage renal disease, no current access for dialysis EXAM: ULTRASOUND GUIDANCE FOR VASCULAR ACCESS RIGHT INTERNAL JUGULAR PERMANENT HEMODIALYSIS CATHETER Date:  08/31/2023 08/31/2023 11:53 am Radiologist:  M. Ruel Favors, MD Guidance:  Ultrasound and fluoroscopic FLUOROSCOPY: Fluoroscopy Time: 1 minutes 18 seconds (80 mGy). MEDICATIONS: Ancef 2 g within 1 hour of the procedure ANESTHESIA/SEDATION: Versed 0.5 mg IV; Fentanyl 25 mcg IV; Moderate Sedation Time:  15 minute The patient was continuously monitored during the procedure by the interventional radiology nurse under my direct supervision. CONTRAST:  None. COMPLICATIONS: None immediate. PROCEDURE: Informed consent was obtained from the patient following explanation of the procedure, risks, benefits and alternatives. The patient understands, agrees and consents for the procedure. All questions were addressed. A time out was performed. Maximal barrier sterile technique utilized including caps, mask, sterile gowns,  sterile gloves, large sterile drape, hand hygiene, and 2% chlorhexidine scrub. Under sterile conditions and local anesthesia, right internal jugular micropuncture venous access was performed with ultrasound. Images were obtained for documentation of the patent right internal jugular vein. A guide wire was inserted followed by a transitional dilator. Next, a 0.035 guidewire was advanced into the IVC with a 5-French catheter. Measurements were obtained from the right venotomy site to the proximal right atrium. In the right infraclavicular chest, a subcutaneous tunnel was created under sterile conditions and local anesthesia. 1% lidocaine with epinephrine was utilized for this. The 23 cm tip to cuff palindrome catheter was tunneled subcutaneously to the venotomy site and inserted into the SVC/RA junction through a valved peel-away sheath. Position was confirmed with fluoroscopy. Images were obtained for documentation. Blood was aspirated from the catheter followed by saline and heparin flushes. The appropriate volume and strength of heparin was instilled in each lumen. Caps were applied. The catheter was secured at the tunnel site with Gelfoam and a pursestring suture. The venotomy site was closed with subcuticular Vicryl suture. Dermabond was applied to the small right neck incision. A dry sterile dressing was applied. The catheter is ready for use. No immediate complications. IMPRESSION: Ultrasound and fluoroscopically guided right internal jugular tunneled hemodialysis catheter (23 cm tip to cuff palindrome catheter). Electronically Signed   By: Judie Petit.  Shick M.D.   On: 08/31/2023 12:00    Labs: BMET Recent Labs  Lab 08/26/23 1025 08/26/23 1502 08/27/23 0601 08/28/23 0555 08/29/23 0248 08/30/23  0252 09/01/23 0840  NA 144  --  142 138 142 137 139  K 4.6  --  5.1 5.1 5.0 4.8 3.8  CL 115*  --  112* 108 106 106 102  CO2 20*  --  19* 19* 19* 18* 22  GLUCOSE 131*  --  113* 98 78 88 73  BUN 57*  --  57* 66*  64* 70* 44*  CREATININE 5.31* 5.45* 6.11* 6.58* 6.86* 7.23* 5.27*  CALCIUM 8.7*  --  8.8* 8.5* 9.5 8.8* 8.7*  PHOS  --   --   --  5.7* 5.9* 5.8*  --    CBC Recent Labs  Lab 08/26/23 1502 08/27/23 0601 08/30/23 0252 09/01/23 0840  WBC 6.6 7.8 8.7 6.3  HGB 8.4* 8.9* 7.9* 9.1*  HCT 28.7* 32.3* 26.9* 29.2*  MCV 109.5* 116.2* 108.5* 103.5*  PLT 243 299 230 263    Medications:     amLODipine  10 mg Oral QHS   atorvastatin  20 mg Oral QHS   calcitRIOL  0.5 mcg Oral Daily   carvedilol  6.25 mg Oral BID WC   Chlorhexidine Gluconate Cloth  6 each Topical Q0600   heparin  5,000 Units Subcutaneous Q8H   hydrALAZINE  50 mg Oral Q8H   insulin aspart  0-15 Units Subcutaneous TID WC   insulin aspart  0-5 Units Subcutaneous QHS   insulin glargine  20 Units Subcutaneous Daily   montelukast  10 mg Oral QHS   polyethylene glycol  17 g Oral Daily      Anthony Sar, MD [facility] 09/02/2023, 9:31 AM

## 2023-09-02 NOTE — Anesthesia Postprocedure Evaluation (Signed)
 Anesthesia Post Note  Patient: Erin Davila  Procedure(s) Performed: REVISON OF ARTERIOVENOUS FISTULA LEFT (Left)     Patient location during evaluation: PACU Anesthesia Type: General Level of consciousness: awake and alert Pain management: pain level controlled Vital Signs Assessment: post-procedure vital signs reviewed and stable Respiratory status: spontaneous breathing, nonlabored ventilation, respiratory function stable and patient connected to nasal cannula oxygen Cardiovascular status: blood pressure returned to baseline and stable Postop Assessment: no apparent nausea or vomiting Anesthetic complications: no   No notable events documented.  Last Vitals:  Vitals:   09/02/23 1600 09/02/23 1610  BP: (!) 148/77 (!) 148/78  Pulse: (!) 112 (!) 112  Resp: 17 (!) 21  Temp:    SpO2: 92% 92%    Last Pain:  Vitals:   09/02/23 1610  TempSrc:   PainSc: Asleep                 Mariann Barter

## 2023-09-02 NOTE — Progress Notes (Addendum)
       NPO past MN Plan for superficialization and branch ligation of the left UE AV fistula.  Patient agrees to proceed   Mosetta Pigeon PA-C  VASCULAR STAFF ADDENDUM: I have independently interviewed and examined the patient. I agree with the above.   Rande Brunt. Lenell Antu, MD [facility] Phone Number: [phone] 09/02/2023 12:54 PM

## 2023-09-02 NOTE — Anesthesia Preprocedure Evaluation (Signed)
 Anesthesia Evaluation  Patient identified by MRN, date of birth, ID band Patient awake    Reviewed: Allergy & Precautions, NPO status , Patient's Chart, lab work & pertinent test results, reviewed documented beta blocker date and time   History of Anesthesia Complications Negative for: history of anesthetic complications  Airway Mallampati: II  TM Distance: >3 FB     Dental no notable dental hx.    Pulmonary neg COPD, neg PE   breath sounds clear to auscultation       Cardiovascular hypertension, +CHF  (-) CAD and (-) Past MI  Rhythm:Regular Rate:Normal     Neuro/Psych neg Seizures    GI/Hepatic ,GERD  Medicated and Controlled,,(+) neg Cirrhosis        Endo/Other  diabetes, Type 2    Renal/GU ESRFRenal diseaseAdmitted for volume overload, now improved, no O2 requirement     Musculoskeletal   Abdominal   Peds  Hematology  (+) Blood dyscrasia, anemia   Anesthesia Other Findings   Reproductive/Obstetrics                              Anesthesia Physical Anesthesia Plan  ASA: 3  Anesthesia Plan: General   Post-op Pain Management: Regional block*   Induction: Intravenous  PONV Risk Score and Plan: 2 and Ondansetron and Dexamethasone  Airway Management Planned: LMA  Additional Equipment:   Intra-op Plan:   Post-operative Plan: Extubation in OR  Informed Consent: I have reviewed the patients History and Physical, chart, labs and discussed the procedure including the risks, benefits and alternatives for the proposed anesthesia with the patient or authorized representative who has indicated his/her understanding and acceptance.     Dental advisory given  Plan Discussed with: CRNA  Anesthesia Plan Comments:          Anesthesia Quick Evaluation

## 2023-09-02 NOTE — Progress Notes (Signed)
 Attempted to speak with pt regarding appt times for out-pt HD but pt currently off the floor. Will f/u tomorrow.   Olivia Canter Renal Navigator [phone]

## 2023-09-02 NOTE — Anesthesia Procedure Notes (Signed)
 Anesthesia Regional Block: Supraclavicular block   Pre-Anesthetic Checklist: , timeout performed,  Correct Patient, Correct Site, Correct Laterality,  Correct Procedure, Correct Position, site marked,  Risks and benefits discussed,  Surgical consent,  Pre-op evaluation,  At surgeon's request and post-op pain management  Laterality: Left  Prep: Maximum Sterile Barrier Precautions used, chloraprep       Needles:  Injection technique: Single-shot  Needle Type: Echogenic Needle      Needle Gauge: 20     Additional Needles:   Procedures:,,,, ultrasound used (permanent image in chart),,    Narrative:  Start time: 09/02/2023 1:05 PM End time: 09/02/2023 1:10 PM Injection made incrementally with aspirations every 5 mL.  Performed by: Personally  Anesthesiologist: Mariann Barter, MD

## 2023-09-02 NOTE — Discharge Instructions (Signed)
 [facility]  Discharge Instructions  AV Fistula or Graft Surgery for Dialysis Access  Please refer to the following instructions for your post-procedure care. Your surgeon or physician assistant will discuss any changes with you.  Activity  You may drive the day following your surgery, if you are comfortable and no longer taking prescription pain medication. Resume full activity as the soreness in your incision resolves.  Bathing/Showering  You may shower after you go home. Keep your incision dry for 48 hours. Do not soak in a bathtub, hot tub, or swim until the incision heals completely. You may not shower if you have a hemodialysis catheter.  Incision Care  Clean your incision with mild soap and water after 48 hours. Pat the area dry with a clean towel. You do not need a bandage unless otherwise instructed. Do not apply any ointments or creams to your incision. You may have skin glue on your incision. Do not peel it off. It will come off on its own in about one week. Your arm may swell a bit after surgery. To reduce swelling use pillows to elevate your arm so it is above your heart. Your doctor will tell you if you need to lightly wrap your arm with an ACE bandage.  Diet  Resume your normal diet. There are not special food restrictions following this procedure. In order to heal from your surgery, it is CRITICAL to get adequate nutrition. Your body requires vitamins, minerals, and protein. Vegetables are the best source of vitamins and minerals. Vegetables also provide the perfect balance of protein. Processed food has little nutritional value, so try to avoid this.  Medications  Resume taking all of your medications. If your incision is causing pain, you may take over-the counter pain relievers such as acetaminophen (Tylenol). If you were prescribed a stronger pain medication, please be aware these medications can cause nausea and constipation. Prevent  nausea by taking the medication with a snack or meal. Avoid constipation by drinking plenty of fluids and eating foods with high amount of fiber, such as fruits, vegetables, and grains.  Do not take Tylenol if you are taking prescription pain medications.  Follow up Your surgeon may want to see you in the office following your access surgery. If so, this will be arranged at the time of your surgery.  Please call us immediately for any of the following conditions:  Increased pain, redness, drainage (pus) from your incision site Fever of 101 degrees or higher Severe or worsening pain at your incision site Hand pain or numbness.  Reduce your risk of vascular disease:  Stop smoking. If you would like help, call [facility] at 1-800-QUIT-NOW ((505)658-7639) or [place] at [phone]  Manage your cholesterol Maintain a desired weight Control your diabetes Keep your blood pressure down  Dialysis  It will take several weeks to several months for your new dialysis access to be ready for use. Your surgeon will determine when it is okay to use it. Your nephrologist will continue to direct your dialysis. You can continue to use your Permcath until your new access is ready for use.   09/02/2023 Erin Davila [id number] 09/28/1960  Surgeon(s): [name], MD  Procedure(s): REVISON OF ARTERIOVENOUS FISTULA LEFT   May stick graft immediately   May stick graft on designated area only:   X Do not stick left upper arm AV fistula for 6 weeks    If you have any questions, please call the  office at [phone].

## 2023-09-02 NOTE — Anesthesia Procedure Notes (Signed)
 Procedure Name: Intubation Date/Time: 09/02/2023 1:29 PM  Performed by: Darlina Guys, CRNAPre-anesthesia Checklist: Patient identified, Emergency Drugs available, Suction available and Patient being monitored Patient Re-evaluated:Patient Re-evaluated prior to induction Oxygen Delivery Method: Circle System Utilized Preoxygenation: Pre-oxygenation with 100% oxygen Induction Type: IV induction Laryngoscope Size: Glidescope and 3 Grade View: Grade I Tube type: Oral Tube size: 7.0 mm Number of attempts: 1 Airway Equipment and Method: Stylet Placement Confirmation: ETT inserted through vocal cords under direct vision, positive ETCO2 and breath sounds checked- equal and bilateral Secured at: 21 cm Tube secured with: Tape Dental Injury: Teeth and Oropharynx as per pre-operative assessment  Comments: Atraumatic induction/intubation. Dentition and oral mucosa as per preop.

## 2023-09-02 NOTE — Progress Notes (Signed)
 [facility] PROGRESS NOTE  Erin Davila (DOB: [birth year]) [id number] PCP: [name], MD  Brief Narrative: Erin Davila is a 63 y.o. 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 At baseline, independent with ADLs.   Presented from home to the ED on 08/26/2023 with SOB x a couple months intermittently but severely worsened today. She states that since October she has been working from home and more sedentary since then. She endorses lower extremity swelling during this time and thinks its due to sitting longer periods of time. She has intermittent shortness of breath which is worse with temperature changes so thinks the cold weather is what worsened it today.     Admitted to [facility] and transferred to [facility] for cardiology evaluation. Now starting HD.  Subjective: Seen after procedure this evening, pain in left arm is controlled, no other complaints. She's drowsy but rousable and oriented.   Objective: BP (!) 155/86 (BP Location: Right Leg)   Pulse (!) 113   Temp 97.9 F (36.6 C) (Axillary)   Resp 20   Ht 5\' 8"  (1.727 m)   Wt (!) 181.4 kg   LMP 03/26/2015   SpO2 91%   BMI 60.82 kg/m   Gen: Obese tired female in no distress Pulm: Diminished but no wheezes or crackles. Tachypneic with normal effort.   CV: Regular borderline tachycardia, distant without MRG. + LE edema symmetrically.  GI: Soft, NT, ND, +BS  Neuro: Rousable with no noted focal deficits on limited exam Ext: Warm, no deformities. LUE AVF dressing c/d/I, distal radial pulse intact, sensation intact, moves digits, warm.  Skin: TDC c/d/i with no other rashes, lesions or ulcers on visualized skin   Assessment & Plan: Acute hypoxic respiratory failure:  - Weaned but placed back on perioperatively. Will encourage IS/FV and continue volume removal with HD.    Anemia -most likely chronic disease and volume dilution -improved after HD   Acute on chronic  Diastolic heart failure- EF 60-65%.  Left ventricular internal cavity moderately to severely dilated with mild left ventricular hypertrophy.  Diastolic parameters are indeterminate based off the echo read. 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  - Continue lasix per nephrology.  - Continue slow start HD thru [facility] placed 2/24.  - s/p LUE AVF revision 2/26.     Type II DM- takes ozempic at home.  -SSI -reduced lantus    HLD- continue home simvastatin   GERD- continue home protonix   Class III obesity: Body mass index is 60.82 kg/m.   Tyrone Nine, MD [facility] [email or web address] 09/02/2023, 5:40 PM

## 2023-09-02 NOTE — Transfer of Care (Signed)
 Immediate Anesthesia Transfer of Care Note  Patient: SHADELL BRENN  Procedure(s) Performed: REVISON OF ARTERIOVENOUS FISTULA LEFT (Left)  Patient Location: PACU  Anesthesia Type:General  Level of Consciousness: drowsy  Airway & Oxygen Therapy: Patient Spontanous Breathing and Patient connected to face mask oxygen  Post-op Assessment: Report given to RN and Post -op Vital signs reviewed and stable  Post vital signs: Reviewed and stable  Last Vitals:  Vitals Value Taken Time  BP 164/72 09/02/23 1522  Temp    Pulse 77 09/02/23 1529  Resp 22 09/02/23 1529  SpO2 93 % 09/02/23 1529  Vitals shown include unfiled device data.  Last Pain:  Vitals:   09/02/23 1235  TempSrc: Oral  PainSc:          Complications: No notable events documented.

## 2023-09-02 NOTE — Op Note (Signed)
 DATE OF SERVICE: 09/02/2023  PATIENT:  Erin Davila  63 y.o. female  PRE-OPERATIVE DIAGNOSIS:  ESRD  POST-OPERATIVE DIAGNOSIS:  Same  PROCEDURE:   Revision of left arm arteriovenous fistula  SURGEON:  Surgeons and Role:    * [name], MD - Primary  ASSISTANT: Nathanial Rancher, PA-C  An experienced assistant was required given the complexity of this procedure and the standard of surgical care. My assistant helped with exposure through counter tension, suctioning, ligation and retraction to better visualize the surgical field.  My assistant expedited sewing during the case by following my sutures. Wherever I use the term "we" in the report, my assistant actively helped me with that portion of the procedure.  ANESTHESIA:   general  EBL: minimal  BLOOD ADMINISTERED:none  DRAINS: none   LOCAL MEDICATIONS USED:  NONE  SPECIMEN:  none  COUNTS: confirmed correct.  TOURNIQUET:  none  PATIENT DISPOSITION:  PACU - hemodynamically stable.   Delay start of Pharmacological VTE agent (>24hrs) due to surgical blood loss or risk of bleeding: no  INDICATION FOR PROCEDURE: Erin Davila is a 63 y.o. female with end-stage renal disease.  I previously created a brachiocephalic arteriovenous fistula for her.  This has not yet matured.  The fistula is too deep to cannulate.. After careful discussion of risks, benefits, and alternatives the patient was offered fistula revision. The patient understood and wished to proceed.  OPERATIVE FINDINGS: The fistula appeared healthy throughout its course.  Several small sidebranches were ligated.  Fistula was transposed into the immediate subcutaneous skin in the upper arm using a Gore tunneler.  Good technical result was achieved.  Brisk Doppler bruit heard at completion.  Good radial artery flow at completion.  DESCRIPTION OF PROCEDURE: After identification of the patient in the pre-operative holding area, the patient was transferred to the  operating room. The patient was positioned supine on the operating room table. Anesthesia was induced. The left arm was prepped and draped in standard fashion. A surgical pause was performed confirming correct patient, procedure, and operative location.  The course of the brachiocephalic fistula was mapped on the upper arm using intraoperative duplex.  Skip incisions were made over the course of the fistula using a 10 blade.  These incisions were carried down through subcutaneous tissue to the fistula using Bovie electrocautery.  The fistula was skeletonized throughout the length of the upper arm using a mix of blood, sharp, and Bovie dissection.  After isolating the fistula completely a subcutaneous tunnel was created with a Gore tunneler.  The fistula was clamped proximally and distally and transected.  The fistula was delivered through the new subcutaneous tunnel and reanastomosed to itself using continuous running suture of 6-0 Prolene.  After completion the anastomosis was flushed and de-aired.  The anastomosis was completed.  Doppler machine was used to evaluate the repair.  Good Doppler bruit was heard throughout the fistula.  Good Doppler signal was heard at the radial artery.  The wounds were closed in layers after confirming hemostasis using 3-0 Vicryl, 4-0 Monocryl.  Benzoin and Steri-Strips were applied.  Clean bandages were applied.  Upon completion of the case instrument and sharps counts were confirmed correct. The patient was transferred to the PACU in good condition. I was present for all portions of the procedure.  FOLLOW UP PLAN: Assuming a normal postoperative course, VVS PA will see the patient in 6 weeks with AVF duplex.   Rande Brunt. Lenell Antu, MD FACS [facility] [employer] Number: [phone] 09/02/2023 2:58 PM

## 2023-09-03 ENCOUNTER — Other Ambulatory Visit (HOSPITAL_COMMUNITY): Payer: Self-pay | Admitting: *Deleted

## 2023-09-03 ENCOUNTER — Encounter (HOSPITAL_COMMUNITY): Payer: 59

## 2023-09-03 ENCOUNTER — Encounter (HOSPITAL_COMMUNITY): Payer: Self-pay | Admitting: Vascular Surgery

## 2023-09-03 DIAGNOSIS — R0902 Hypoxemia: Secondary | ICD-10-CM | POA: Diagnosis not present

## 2023-09-03 LAB — CBC
HCT: 31.7 % — ABNORMAL LOW (ref 36.0–46.0)
Hemoglobin: 9.7 g/dL — ABNORMAL LOW (ref 12.0–15.0)
MCH: 31.9 pg (ref 26.0–34.0)
MCHC: 30.6 g/dL (ref 30.0–36.0)
MCV: 104.3 fL — ABNORMAL HIGH (ref 80.0–100.0)
Platelets: 234 10*3/uL (ref 150–400)
RBC: 3.04 MIL/uL — ABNORMAL LOW (ref 3.87–5.11)
RDW: 15.4 % (ref 11.5–15.5)
WBC: 6 10*3/uL (ref 4.0–10.5)
nRBC: 0 % (ref 0.0–0.2)

## 2023-09-03 LAB — RENAL FUNCTION PANEL
Albumin: 3 g/dL — ABNORMAL LOW (ref 3.5–5.0)
Anion gap: 16 — ABNORMAL HIGH (ref 5–15)
BUN: 30 mg/dL — ABNORMAL HIGH (ref 8–23)
CO2: 23 mmol/L (ref 22–32)
Calcium: 8.5 mg/dL — ABNORMAL LOW (ref 8.9–10.3)
Chloride: 96 mmol/L — ABNORMAL LOW (ref 98–111)
Creatinine, Ser: 4.8 mg/dL — ABNORMAL HIGH (ref 0.44–1.00)
GFR, Estimated: 10 mL/min — ABNORMAL LOW (ref >60)
Glucose, Bld: 110 mg/dL — ABNORMAL HIGH (ref 70–99)
Phosphorus: 4.9 mg/dL — ABNORMAL HIGH (ref 2.5–4.6)
Potassium: 3.6 mmol/L (ref 3.5–5.1)
Sodium: 135 mmol/L (ref 135–145)

## 2023-09-03 LAB — GLUCOSE, CAPILLARY
Glucose-Capillary: 100 mg/dL — ABNORMAL HIGH (ref 70–99)
Glucose-Capillary: 78 mg/dL (ref 70–99)
Glucose-Capillary: 92 mg/dL (ref 70–99)
Glucose-Capillary: 94 mg/dL (ref 70–99)

## 2023-09-03 MED ORDER — INSULIN GLARGINE 100 UNIT/ML ~~LOC~~ SOLN
15.0000 [IU] | Freq: Every day | SUBCUTANEOUS | Status: DC
  Administered 2023-09-03 – 2023-09-04 (×2): 15 [IU] via SUBCUTANEOUS
  Filled 2023-09-03 (×2): qty 0.15

## 2023-09-03 MED ORDER — TORSEMIDE 20 MG PO TABS
40.0000 mg | ORAL_TABLET | Freq: Two times a day (BID) | ORAL | Status: DC
  Administered 2023-09-03 – 2023-09-04 (×2): 40 mg via ORAL
  Filled 2023-09-03 (×2): qty 2

## 2023-09-03 MED ORDER — DIPHENHYDRAMINE HCL 25 MG PO CAPS
25.0000 mg | ORAL_CAPSULE | Freq: Four times a day (QID) | ORAL | Status: DC | PRN
  Administered 2023-09-03: 50 mg via ORAL
  Filled 2023-09-03: qty 2
  Filled 2023-09-03: qty 1

## 2023-09-03 NOTE — Plan of Care (Signed)
   Problem: Health Behavior/Discharge Planning: Goal: Ability to manage health-related needs will improve Outcome: Progressing   Problem: Clinical Measurements: Goal: Ability to maintain clinical measurements within normal limits will improve Outcome: Progressing   Problem: Clinical Measurements: Goal: Will remain free from infection Outcome: Progressing

## 2023-09-03 NOTE — Progress Notes (Signed)
 [facility] PROGRESS NOTE  Erin Davila (DOB: 08-07-1960) [id number] PCP: [name], MD  Brief Narrative: Erin Davila is a 63 y.o. 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 At baseline, independent with ADLs.   Presented from home to the ED on 08/26/2023 with SOB x a couple months intermittently but severely worsened today. She states that since October she has been working from home and more sedentary since then. She endorses lower extremity swelling during this time and thinks its due to sitting longer periods of time. She has intermittent shortness of breath which is worse with temperature changes so thinks the cold weather is what worsened it today.     Admitted to [facility] and transferred to [facility] for cardiology evaluation. Now starting HD.  Subjective: No complaints this morning, when asked she confirms her left upper arm does hurt. Denies dyspnea or chest pain or bleeding. She feels very cold, has a couple blankets on I rearranged. She answered a call from friend during encounter.   Objective: BP (!) 120/55 (BP Location: Right Arm)   Pulse 80   Temp 99.3 F (37.4 C) (Oral)   Resp (!) 21   Ht 5\' 8"  (1.727 m)   Wt (!) 181.4 kg   LMP 03/26/2015   SpO2 95%   BMI 60.81 kg/m   Gen: Obese female in no distress Pulm: Clear, distant, nonlabored  CV: RRR, +LE edema GI: Soft, NT, ND, +BS  Neuro: Alert and oriented. No new focal deficits. Ext: Warm, no deformities. LUE AVF dressing c/d/I, distal radial pulse intact, sensation intact, moves digits, warm.   Skin: TDC c/d/i in right chest with no other rashes, lesions or ulcers on visualized skin   Assessment & Plan: Acute hypoxic respiratory failure:  - Weaned from oxygen during the day, appears to desaturate when sleeping, strongly recommend outpatient sleep study. May check nocturnal O2 prior to discharge, though anticipate improvement with volume removal with  HD and aggressive IV diuresis. - Continue to encourage IS/FV   Anemia of CKD: Monitor intermittently, no bleeding - ESA per nephrology.    Acute on chronic HFpEF: EF 60-65%.  Left ventricular internal cavity moderately to severely dilated with mild left ventricular hypertrophy.  Diastolic parameters are indeterminate based off the echo read. - Cardiology has signed off since starting HD for volume management. Also giving lasix 160mg  IV TID on off-HD days. Taken 3.5L UF off last night, tolerating well.    HTN-  -adjust meds as needed   CKD IV: Now ESRD. - Continue lasix per nephrology.  - Continue slow start HD thru [facility] placed 2/24. Has transitional care unit hemodialysis spot on Mon, Tues, Thurs, Fri at 8:30am chair time. Can accept pt as early as 2/28. - s/p LUE AVF revision 2/26. Vascular surgery planning to take down dressing 2/28.      T2DM:  - Hold home ozempic - Continue SSI. Home basal insulin said to be 38u qHS, though hypoglycemia here causing downward titration. Will further decrease to 15u dosing today.    HLD:  - Continue statin   GERD:  - Continue PPI   Class III obesity: Body mass index is 60.81 kg/m.   Tyrone Nine, MD [facility] [email or web address] 09/03/2023, 12:01 PM

## 2023-09-03 NOTE — Progress Notes (Signed)
 Completed 3 hours of dialysis. Tolerated net fluid removal of 3.5L. VS stable. No significant event. RIJ TDC hep-locked and de accessed using aseptic technique. Replaced new caps securely. Patient is alert and conversant. No signs of distress. Equal chest rise and fall on cardiac monitor. Patient sent back to her room via transporter. Handoff report given to [facility].

## 2023-09-03 NOTE — Progress Notes (Signed)
 Pt has been accepted at Transitional Care Unit at [facility] on Mon, Tues, Thurs, Fri with 8:30 am chair time. Pt can start tomorrow and will need to arrive at 7:45 am to complete paperwork prior to treatment. Met with pt at bedside to discuss arrangements. Pt agreeable to plans and schedule letter provided. Update provided to attending, nephrologist, pt's RN, and RN CM. Will assist as needed.   Olivia Canter Renal Navigator [phone]

## 2023-09-03 NOTE — Progress Notes (Signed)
 [facility]  Subjective  - pain full at incisions   Objective (!) 123/59 82 99.1 F (37.3 C) (Oral) 17 94%  Intake/Output Summary (Last 24 hours) at 09/03/2023 0717 Last data filed at 09/03/2023 0101 Gross per 24 hour  Intake 630 ml  Output 3805 ml  Net -3175 ml    Left UE dressing in place Palpable radial pulse with intact motor  Lungs non labored breathing  Assessment/Planning: POD # 1  Revision of left arm arteriovenous fistula  Plans to remove dressing tomorrow once her pain has settled down. Intact radial pulse with motor and sensation in the hand no sign of steal.  Mosetta Pigeon 09/03/2023 7:17 AM --  Laboratory Lab Results: Recent Labs    09/01/23 0840 09/03/23 0302  WBC 6.3 6.0  HGB 9.1* 9.7*  HCT 29.2* 31.7*  PLT 263 234   BMET Recent Labs    09/02/23 0839 09/03/23 0302  NA 137 135  K 3.4* 3.6  CL 99 96*  CO2 23 23  GLUCOSE 71 110*  BUN 47* 30*  CREATININE 6.04* 4.80*  CALCIUM 8.7* 8.5*    COAG No results found for: "INR", "PROTIME" No results found for: "PTT"

## 2023-09-03 NOTE — Progress Notes (Signed)
 [facility] Progress Note    Assessment/ Plan:   AKI on CKD 5, now ESRD - b/l creatinine is 5.9 from oct 2024. Creat here 5.4 on admission in the setting of new-onset vol overload w/ pulm edema. UA w/ mild hematuria/ pyuria, [place] w/o obstruction. AKI due to CHF vs progression of CKD. Did not respond to 100 lasix IV tid and is making urine on Lasix 160mg  but still not enough. Creat rising.   -did not robustly respond to aggressive/high dose diuretics therefore IR consulted for [facility] placement-placed 2/24 (AVF not ready to use). Slow start protocol--HD#1 2/24-2/25. HD#2 2/26. HD#3 2/28  -clip: [facility] Monday, Tuesday, Thursday, Friday.  8:30 AM. Can start 2/28 Volume overload/ acute hypoxic resp failure - 1st time w/ volume overload like this. . CXR showed vasc congestion and sig LE edema by exam. ECHO showed nl LV, mild RV failure. Seen by cardiology, appreciate assistance.  Will plan for dialysis to manage volume. Cont diuretics, switching to PO.  Torsemide 40 mg twice daily.  On discharge recommend discharging her on torsemide 40 mg twice daily on off dialysis days HTN - Will UF as tolerated with HD HD access - AVF surgery done in October 2024, status post superficialization and branch ligation on 2/26 with Dr. Lenell Antu.    Subjective:   Patient seen and examined bedside. No complaints, incisional pain is better with meds. Has some itchiness in her back, wants benadryl.    Objective:   BP (!) 120/55 (BP Location: Right Arm)   Pulse 80   Temp 99.3 F (37.4 C) (Oral)   Resp (!) 21   Ht 5\' 8"  (1.727 m)   Wt (!) 181.4 kg   LMP 03/26/2015   SpO2 95%   BMI 60.81 kg/m   Intake/Output Summary (Last 24 hours) at 09/03/2023 1345 Last data filed at 09/03/2023 1000 Gross per 24 hour  Intake 750 ml  Output 3805 ml  Net -3055 ml   Weight change: -0.361 kg  Physical Exam: Gen: NAD CVS: RRR Resp: decreased breath sounds bibasilar, normal wob, no crackles/rales Abd: obese, soft,  nt Ext: 1+ edema b/l LEs Neuro: awake, alert Dialysis access: LUE AVF +b/t, +TDC  Imaging: No results found.   Labs: BMET Recent Labs  Lab 08/28/23 0555 08/29/23 0248 08/30/23 0252 09/01/23 0840 09/02/23 0839 09/03/23 0302  NA 138 142 137 139 137 135  K 5.1 5.0 4.8 3.8 3.4* 3.6  CL 108 106 106 102 99 96*  CO2 19* 19* 18* 22 23 23   GLUCOSE 98 78 88 73 71 110*  BUN 66* 64* 70* 44* 47* 30*  CREATININE 6.58* 6.86* 7.23* 5.27* 6.04* 4.80*  CALCIUM 8.5* 9.5 8.8* 8.7* 8.7* 8.5*  PHOS 5.7* 5.9* 5.8*  --  5.0* 4.9*   CBC Recent Labs  Lab 08/30/23 0252 09/01/23 0840 09/03/23 0302  WBC 8.7 6.3 6.0  HGB 7.9* 9.1* 9.7*  HCT 26.9* 29.2* 31.7*  MCV 108.5* 103.5* 104.3*  PLT 230 263 234    Medications:     amLODipine  10 mg Oral QHS   atorvastatin  20 mg Oral QHS   calcitRIOL  0.5 mcg Oral Daily   carvedilol  6.25 mg Oral BID WC   Chlorhexidine Gluconate Cloth  6 each Topical Q0600   feeding supplement  1 Container Oral BID BM   heparin  5,000 Units Subcutaneous Q8H   hydrALAZINE  50 mg Oral Q8H   insulin aspart  0-15 Units Subcutaneous TID WC  insulin aspart  0-5 Units Subcutaneous QHS   insulin glargine  15 Units Subcutaneous Daily   montelukast  10 mg Oral QHS   multivitamin  1 tablet Oral QHS   polyethylene glycol  17 g Oral Daily      Anthony Sar, MD [facility] 09/03/2023, 1:45 PM

## 2023-09-04 DIAGNOSIS — R0902 Hypoxemia: Secondary | ICD-10-CM | POA: Diagnosis not present

## 2023-09-04 LAB — RENAL FUNCTION PANEL
Albumin: 2.8 g/dL — ABNORMAL LOW (ref 3.5–5.0)
Anion gap: 12 (ref 5–15)
BUN: 42 mg/dL — ABNORMAL HIGH (ref 8–23)
CO2: 25 mmol/L (ref 22–32)
Calcium: 8.3 mg/dL — ABNORMAL LOW (ref 8.9–10.3)
Chloride: 97 mmol/L — ABNORMAL LOW (ref 98–111)
Creatinine, Ser: 6.71 mg/dL — ABNORMAL HIGH (ref 0.44–1.00)
GFR, Estimated: 7 mL/min — ABNORMAL LOW (ref >60)
Glucose, Bld: 78 mg/dL (ref 70–99)
Phosphorus: 6.1 mg/dL — ABNORMAL HIGH (ref 2.5–4.6)
Potassium: 3.8 mmol/L (ref 3.5–5.1)
Sodium: 134 mmol/L — ABNORMAL LOW (ref 135–145)

## 2023-09-04 LAB — GLUCOSE, CAPILLARY
Glucose-Capillary: 80 mg/dL (ref 70–99)
Glucose-Capillary: 95 mg/dL (ref 70–99)
Glucose-Capillary: 92 mg/dL (ref 70–99)

## 2023-09-04 MED ORDER — OXYCODONE-ACETAMINOPHEN 5-325 MG PO TABS: 1.0000 | ORAL_TABLET | Freq: Three times a day (TID) | ORAL | 0 refills | Status: AC | PRN

## 2023-09-04 MED ORDER — RENA-VITE PO TABS: 1.0000 | ORAL_TABLET | Freq: Every day | ORAL | 0 refills | Status: AC

## 2023-09-04 MED ORDER — CALCITRIOL 0.5 MCG PO CAPS: 0.5000 ug | ORAL_CAPSULE | Freq: Every day | ORAL | 0 refills | Status: AC

## 2023-09-04 MED ORDER — TORSEMIDE 40 MG PO TABS: 40.0000 mg | ORAL_TABLET | Freq: Two times a day (BID) | ORAL | 0 refills | Status: AC

## 2023-09-04 MED ORDER — LANTUS SOLOSTAR 100 UNIT/ML ~~LOC~~ SOPN: 38.0000 [IU] | PEN_INJECTOR | Freq: Every day | SUBCUTANEOUS | 0 refills | Status: AC

## 2023-09-04 MED ORDER — HEPARIN SODIUM (PORCINE) 1000 UNIT/ML IJ SOLN
INTRAMUSCULAR | Status: AC
  Filled 2023-09-04: qty 4

## 2023-09-04 NOTE — Evaluation (Signed)
 Physical Therapy Evaluation Patient Details Name: Erin Davila MRN: [id number] DOB: 1961-07-02 Today's Date: 09/04/2023  History of Present Illness  Pt is a a 63 y.o. female presenting 08/26/23 with a couple months hx of SOB that severely worsened today. Pt is s/p Revision of left arm arteriovenous fistula and new diagnosis of ESRD and acute on chronic heart failure. PMHx DM2, obesity, anemia, CKD V, HTN, GERD, HLD  Clinical Impression  Pt is a 63 y.o. female presenting on 08/26/23 with above conditions and deficits below, see PT Problem List. PTA pt lived with a roommate in an one story home with 3 STE, intermittently used a SPC when fatigued, worked as a Retail banker and in a bank, and drove. Currently pt is supervision-CGA for bed mobility and transfers and CGA for ambulation. Pt displays deficits in LE strength & AROM, activity tolerance, dynamic balance, and functional mobility and would benefit from continued acute PT care before d/c. After d/c pt would benefit from HHPT to continue to address these impairments. Will continue to follow acutely.          If plan is discharge home, recommend the following: A little help with walking and/or transfers;Assistance with cooking/housework;A little help with bathing/dressing/bathroom;Assist for transportation;Help with stairs or ramp for entrance   Can travel by private vehicle        Equipment Recommendations Rollator (4 wheels) (Bariatric)  Recommendations for Other Services       Functional Status Assessment Patient has had a recent decline in their functional status and demonstrates the ability to make significant improvements in function in a reasonable and predictable amount of time.     Precautions / Restrictions Precautions Precautions: Fall Recall of Precautions/Restrictions: Intact Restrictions Weight Bearing Restrictions Per Provider Order: No      Mobility  Bed Mobility Overal bed mobility: Needs Assistance Bed  Mobility: Supine to Sit     Supine to sit: Supervision, HOB elevated, Used rails, Min assist     General bed mobility comments: Pt required min A for trunk elevation s/t pain in her L arm d/t fistula implantation. Supervision once able to clear trunk from the back of the bed    Transfers Overall transfer level: Needs assistance Equipment used: Rolling walker (2 wheels) Transfers: Sit to/from Stand Sit to Stand: Supervision, Contact guard assist           General transfer comment: CGA for initial STS, supervision for 2nd STS. Pt required extra time and increased trunk flexion to stand.    Ambulation/Gait Ambulation/Gait assistance: Contact guard assist Gait Distance (Feet): 30 Feet Assistive device: Rolling walker (2 wheels) Gait Pattern/deviations: Step-through pattern, Decreased stride length, Shuffle, Trunk flexed Gait velocity: reduced Gait velocity interpretation: <1.8 ft/sec, indicate of risk for recurrent falls   General Gait Details: Pt walks with slow, shuffling and short steps. As her arms begin to fatigue, pt walks with increased trunk flexion and moderately reduces her speed.  Stairs            Wheelchair Mobility     Tilt Bed    Modified Rankin (Stroke Patients Only)       Balance Overall balance assessment: Needs assistance Sitting-balance support: No upper extremity supported, Feet supported Sitting balance-Leahy Scale: Good Sitting balance - Comments: Pt sits EOB no LOB   Standing balance support: Bilateral upper extremity supported, During functional activity, Reliant on assistive device for balance Standing balance-Leahy Scale: Poor Standing balance comment: Pt able to statically stand for around 2 mins  while chair is changed without LOB. Pt stands with increased trunk flexion and intermittently relies on UE support.                             Pertinent Vitals/Pain Pain Assessment Pain Assessment: Faces Faces Pain Scale:  Hurts little more Pain Location: L bicep (s/t fistula implant 2/27) Pain Descriptors / Indicators: Grimacing, Guarding Pain Intervention(s): Monitored during session    Home Living Family/patient expects to be discharged to:: Private residence Living Arrangements: Non-relatives/Friends (lives with roommate) Available Help at Discharge: Friend(s);Family;Available PRN/intermittently Type of Home: House Home Access: Stairs to enter Entrance Stairs-Rails: Left Entrance Stairs-Number of Steps: 3   Home Layout: One level Home Equipment: Cane - single point Additional Comments: Pt reports her church members and other friends/famiy live within 10 minutes of her and can provide assistance PRN    Prior Function Prior Level of Function : Independent/Modified Independent;Working/employed;Driving             Mobility Comments: independent with intermittent SPC use when fatigued ADLs Comments: independent works at a [employer] Extremity Assessment Upper Extremity Assessment: Overall WFL for tasks assessed    Lower Extremity Assessment Lower Extremity Assessment: Overall WFL for tasks assessed    Cervical / Trunk Assessment Cervical / Trunk Assessment: Normal  Communication   Communication Communication: No apparent difficulties    Cognition Arousal: Alert Behavior During Therapy: WFL for tasks assessed/performed   PT - Cognitive impairments: No apparent impairments                         Following commands: Intact       Cueing Cueing Techniques: Verbal cues, Visual cues     General Comments General comments (skin integrity, edema, etc.): VSS throughout session    Exercises     Assessment/Plan    PT Assessment Patient needs continued PT services  PT Problem List Decreased strength;Decreased range of motion;Decreased activity tolerance;Decreased balance;Decreased mobility;Decreased coordination;Decreased  knowledge of use of DME;Cardiopulmonary status limiting activity;Pain       PT Treatment Interventions DME instruction;Gait training;Stair training;Functional mobility training;Therapeutic activities;Therapeutic exercise;Balance training;Neuromuscular re-education;Patient/family education    PT Goals (Current goals can be found in the Care Plan section)  Acute Rehab PT Goals Patient Stated Goal: return home PT Goal Formulation: With patient Time For Goal Achievement: 09/18/23 Potential to Achieve Goals: Good    Frequency Min 1X/week     Co-evaluation               AM-PAC PT "6 Clicks" Mobility  Outcome Measure Help needed turning from your back to your side while in a flat bed without using bedrails?: A Little Help needed moving from lying on your back to sitting on the side of a flat bed without using bedrails?: A Little Help needed moving to and from a bed to a chair (including a wheelchair)?: A Little Help needed standing up from a chair using your arms (e.g., wheelchair or bedside chair)?: A Little Help needed to walk in hospital room?: A Little Help needed climbing 3-5 steps with a railing? : A Lot 6 Click Score: 17    End of Session Equipment Utilized During Treatment: Gait belt;Oxygen Activity Tolerance: Patient limited by fatigue Patient left: in chair;with call bell/phone within reach;with chair alarm set Nurse Communication: Mobility status PT Visit Diagnosis: Unsteadiness on feet (R26.81);Other  abnormalities of gait and mobility (R26.89);Muscle weakness (generalized) (M62.81);Difficulty in walking, not elsewhere classified (R26.2);Pain Pain - Right/Left: Left Pain - part of body: Arm    Time: 1610-9604 PT Time Calculation (min) (ACUTE ONLY): 33 min   Charges:   PT Evaluation $PT Eval Moderate Complexity: 1 Mod PT Treatments $Therapeutic Activity: 8-22 mins PT General Charges $$ ACUTE PT VISIT: 1 Visit        [address], SPT   [place] 09/04/2023, 12:58 PM

## 2023-09-04 NOTE — Discharge Summary (Addendum)
 Physician Discharge Summary   Patient: Erin Davila MRN: [id number] DOB: 1960/09/16  Admit date:     08/26/2023  Discharge date: 09/04/23  Discharge Physician: Tyrone Nine   PCP: [name], MD   Recommendations at discharge:  Follow up with vascular surgery on 3/18 for duplex and in office thereafter.  Continue hemodialysis and nephrology evaluations per routine.  Follow up with PCP in 1-2 weeks.  Recommend outpatient sleep study.  Patient will pursue outpatient physical therapy, has been arranged.  Discharge Diagnoses: Principal Problem:   Hypoxia   Hospital Course: Erin Davila is a 63 y.o. 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 At baseline, independent with ADLs.   Presented from home to the ED on 08/26/2023 with SOB x a couple months intermittently but severely worsened today. She states that since October she has been working from home and more sedentary since then. She endorses lower extremity swelling during this time and thinks its due to sitting longer periods of time. She has intermittent shortness of breath which is worse with temperature changes so thinks the cold weather is what worsened it today.     Admitted to [facility] and transferred to [facility] for cardiology evaluation. Subsequently started on dialysis. Please see problem-oriented details below.  Assessment and Plan: Acute hypoxic respiratory failure:  - Weaned from oxygen during the day, appears to desaturate when sleeping, strongly recommend outpatient sleep study. Anticipate improvement with volume removal with HD and aggressive IV diuresis. - Continue to encourage IS/FV   Anemia of CKD: Monitor intermittently, no bleeding - ESA per nephrology.    Acute on chronic HFpEF: EF 60-65%.  Left ventricular internal cavity moderately to severely dilated with mild left ventricular hypertrophy.  Diastolic parameters are indeterminate based off the echo  read. - Cardiology has signed off since starting HD for volume management. We will continue torsemide on non-HD days.    HTN-  - Adjust meds as needed   ESRD: Previously CKD IV, now HD dependent.  - Continue lasix per nephrology.  - Continue slow start HD thru [facility] placed 2/24 by Dr. Miles Costain. Has transitional care unit hemodialysis spot on Mon, Tues, Thurs, Fri at 8:30am chair time. Notified of DC and will initiate Tx Monday - s/p LUE AVF revision 2/26 by Dr. Lenell Antu (originally placed Oct 2024). Vascular surgery cleared for discharge, has arranged follow up.   T2DM:  - Restart home ozempic. I refilled her home insulin as well. Note lower insulin needs while hospitalized though pt reports she eats rather more liberally at home than she has here.    HLD:  - Continue statin   GERD:  - Continue PPI   Class III obesity: Body mass index is 59.83 kg/m.   Consultants: Cardiology, IR, nephrology, vascular surgery Procedures performed: right IJ tunneled HD catheter insertion 2/24 Dr. Miles Costain.  Disposition: Home Diet recommendation: Carb-modified, renal.  DISCHARGE MEDICATION: Allergies as of 09/04/2023       Reactions   Lactose Other (See Comments)   GI complaints        Medication List     STOP taking these medications    chlorthalidone 25 MG tablet Commonly known as: HYGROTON   HYDROcodone-acetaminophen 5-325 MG tablet Commonly known as: Norco   multivitamin with minerals Tabs tablet       TAKE these medications    acetaminophen 500 MG tablet Commonly known as: TYLENOL Take 500-1,000 mg by  mouth every 6 (six) hours as needed for mild pain (pain score 1-3), moderate pain (pain score 4-6) or headache.   amLODipine 10 MG tablet Commonly known as: NORVASC Take 10 mg by mouth at bedtime.   calcitRIOL 0.5 MCG capsule Commonly known as: ROCALTROL Take 1 capsule (0.5 mcg total) by mouth daily. What changed: Another medication with the same name was removed. Continue taking  this medication, and follow the directions you see here.   carvedilol 6.25 MG tablet Commonly known as: COREG Take 6.25 mg by mouth 2 (two) times daily with a meal.   ferrous sulfate 325 (65 FE) MG tablet Take 325 mg by mouth 3 (three) times a week.   hydrALAZINE 50 MG tablet Commonly known as: APRESOLINE Take 50 mg by mouth in the morning and at bedtime.   hyoscyamine 0.125 MG/5ML Elix Commonly known as: LEVSIN Take 10 mLs (0.25 mg total) by mouth every 6 (six) hours as needed for cramping (abdominal pain/discomfort).   Lantus SoloStar 100 UNIT/ML Solostar Pen Generic drug: insulin glargine Inject 38 Units into the skin at bedtime.   montelukast 10 MG tablet Commonly known as: SINGULAIR Take 1 tablet (10 mg total) by mouth at bedtime.   multivitamin Tabs tablet Take 1 tablet by mouth at bedtime.   oxyCODONE-acetaminophen 5-325 MG tablet Commonly known as: PERCOCET/ROXICET Take 1 tablet by mouth every 8 (eight) hours as needed for up to 5 days for severe pain (pain score 7-10).   Ozempic (2 MG/DOSE) 8 MG/3ML Sopn Generic drug: Semaglutide (2 MG/DOSE) Inject 2 mg into the skin every Monday.   pantoprazole 40 MG tablet Commonly known as: PROTONIX Take 1 tablet (40 mg total) by mouth daily. What changed: when to take this   simvastatin 40 MG tablet Commonly known as: ZOCOR Take 1 tablet (40 mg total) by mouth at bedtime.   Torsemide 40 MG Tabs Take 40 mg by mouth 2 (two) times daily. on non-dialysis days               Durable Medical Equipment  (From admission, onward)           Start     Ordered   09/04/23 1319  For home use only DME 4 wheeled rolling walker with seat  Once       Comments: bariatric  Question:  Patient needs a walker to treat with the following condition  Answer:  Weakness   09/04/23 1318            Follow-up Information     [facility] Follow up.   Why: 3-4 weeks.The office will call you with your  appointment Contact information: [address] [place] [address] [phone]        [name], MD Follow up.   Specialty: Family Medicine Why: Please follow up in a week. Contact information: [address] [place] [address] [phone]         Center, [facility]. Go on 09/07/2023.   Why: Transitional Care Unit on Monday, Tuesday, Thursday, Friday with 8:30 am start time.  On Monday (3/3), arrive at 7:45 am to complete paperwork prior to treatment. Contact information: [address] [place] [address] [phone]         [facility] Follow up.   Why: bariatric rollator Contact information: [phone]        [facility] Follow up.   Specialty: Rehabilitation Why: they will call you to  set up apt times if you do not hear from them in 3 business days please give them a call. thank you Contact information: [address] [place] [address] [phone]               Discharge Exam: Ceasar Mons Weights   09/02/23 1235 09/02/23 2030 09/04/23 0512  Weight: (!) 181.4 kg (!) 181.4 kg (!) 178.5 kg  BP (!) 151/75 (BP Location: Right Leg)   Pulse 76   Temp 98.6 F (37 C) (Oral)   Resp 20   Ht 5\' 8"  (1.727 m)   Wt (!) 178.5 kg   LMP 03/26/2015   SpO2 99%   BMI 59.83 kg/m   Gen: Obese female in no distress Pulm: Clear, distant, nonlabored  CV: RRR, +LE edema GI: Soft, NT, ND, +BS  Neuro: Alert and oriented. No new focal deficits. Ext: Warm, no deformities. LUE AVF dressing c/d, dried scant blood without hemorrhage or exudate. Lateral periwound with minimal small bullae that are not tender, distal radial pulse intact, sensation intact, moves digits, warm.   Skin: TDC c/d/i in right chest with no other rashes, lesions or ulcers on visualized skin   Condition at discharge: stable  The results of significant diagnostics from this hospitalization  (including imaging, microbiology, ancillary and laboratory) are listed below for reference.   Imaging Studies: IR Fluoro Guide CV Line Right Result Date: 08/31/2023 INDICATION: End-stage renal disease, no current access for dialysis EXAM: ULTRASOUND GUIDANCE FOR VASCULAR ACCESS RIGHT INTERNAL JUGULAR PERMANENT HEMODIALYSIS CATHETER Date:  08/31/2023 08/31/2023 11:53 am Radiologist:  M. Ruel Favors, MD Guidance:  Ultrasound and fluoroscopic FLUOROSCOPY: Fluoroscopy Time: 1 minutes 18 seconds (80 mGy). MEDICATIONS: Ancef 2 g within 1 hour of the procedure ANESTHESIA/SEDATION: Versed 0.5 mg IV; Fentanyl 25 mcg IV; Moderate Sedation Time:  15 minute The patient was continuously monitored during the procedure by the interventional radiology nurse under my direct supervision. CONTRAST:  None. COMPLICATIONS: None immediate. PROCEDURE: Informed consent was obtained from the patient following explanation of the procedure, risks, benefits and alternatives. The patient understands, agrees and consents for the procedure. All questions were addressed. A time out was performed. Maximal barrier sterile technique utilized including caps, mask, sterile gowns, sterile gloves, large sterile drape, hand hygiene, and 2% chlorhexidine scrub. Under sterile conditions and local anesthesia, right internal jugular micropuncture venous access was performed with ultrasound. Images were obtained for documentation of the patent right internal jugular vein. A guide wire was inserted followed by a transitional dilator. Next, a 0.035 guidewire was advanced into the IVC with a 5-French catheter. Measurements were obtained from the right venotomy site to the proximal right atrium. In the right infraclavicular chest, a subcutaneous tunnel was created under sterile conditions and local anesthesia. 1% lidocaine with epinephrine was utilized for this. The 23 cm tip to cuff palindrome catheter was tunneled subcutaneously to the venotomy site and  inserted into the SVC/RA junction through a valved peel-away sheath. Position was confirmed with fluoroscopy. Images were obtained for documentation. Blood was aspirated from the catheter followed by saline and heparin flushes. The appropriate volume and strength of heparin was instilled in each lumen. Caps were applied. The catheter was secured at the tunnel site with Gelfoam and a pursestring suture. The venotomy site was closed with subcuticular Vicryl suture. Dermabond was applied to the small right neck incision. A dry sterile dressing was applied. The catheter is ready for use. No immediate complications. IMPRESSION: Ultrasound and fluoroscopically guided right internal  jugular tunneled hemodialysis catheter (23 cm tip to cuff palindrome catheter). Electronically Signed   By: Judie Petit.  Shick M.D.   On: 08/31/2023 12:00   IR US Guide Vasc Access Right Result Date: 08/31/2023 INDICATION: End-stage renal disease, no current access for dialysis EXAM: ULTRASOUND GUIDANCE FOR VASCULAR ACCESS RIGHT INTERNAL JUGULAR PERMANENT HEMODIALYSIS CATHETER Date:  08/31/2023 08/31/2023 11:53 am Radiologist:  M. Ruel Favors, MD Guidance:  Ultrasound and fluoroscopic FLUOROSCOPY: Fluoroscopy Time: 1 minutes 18 seconds (80 mGy). MEDICATIONS: Ancef 2 g within 1 hour of the procedure ANESTHESIA/SEDATION: Versed 0.5 mg IV; Fentanyl 25 mcg IV; Moderate Sedation Time:  15 minute The patient was continuously monitored during the procedure by the interventional radiology nurse under my direct supervision. CONTRAST:  None. COMPLICATIONS: None immediate. PROCEDURE: Informed consent was obtained from the patient following explanation of the procedure, risks, benefits and alternatives. The patient understands, agrees and consents for the procedure. All questions were addressed. A time out was performed. Maximal barrier sterile technique utilized including caps, mask, sterile gowns, sterile gloves, large sterile drape, hand hygiene, and 2%  chlorhexidine scrub. Under sterile conditions and local anesthesia, right internal jugular micropuncture venous access was performed with ultrasound. Images were obtained for documentation of the patent right internal jugular vein. A guide wire was inserted followed by a transitional dilator. Next, a 0.035 guidewire was advanced into the IVC with a 5-French catheter. Measurements were obtained from the right venotomy site to the proximal right atrium. In the right infraclavicular chest, a subcutaneous tunnel was created under sterile conditions and local anesthesia. 1% lidocaine with epinephrine was utilized for this. The 23 cm tip to cuff palindrome catheter was tunneled subcutaneously to the venotomy site and inserted into the SVC/RA junction through a valved peel-away sheath. Position was confirmed with fluoroscopy. Images were obtained for documentation. Blood was aspirated from the catheter followed by saline and heparin flushes. The appropriate volume and strength of heparin was instilled in each lumen. Caps were applied. The catheter was secured at the tunnel site with Gelfoam and a pursestring suture. The venotomy site was closed with subcuticular Vicryl suture. Dermabond was applied to the small right neck incision. A dry sterile dressing was applied. The catheter is ready for use. No immediate complications. IMPRESSION: Ultrasound and fluoroscopically guided right internal jugular tunneled hemodialysis catheter (23 cm tip to cuff palindrome catheter). Electronically Signed   By: Judie Petit.  Shick M.D.   On: 08/31/2023 12:00   US RENAL Result Date: 08/28/2023 CLINICAL DATA:  Acute renal failure superimposed on chronic kidney disease. EXAM: RENAL / URINARY TRACT ULTRASOUND COMPLETE COMPARISON:  Renal ultrasound 01/04/2021 FINDINGS: Right Kidney: Renal measurements: 10.1 x 5.1 x 4.6 cm = volume: 121 mL. Echogenicity within normal limits. No mass or hydronephrosis visualized. Left Kidney: Renal measurements: 10.6 x  4.6 x 5.7 cm = volume: 147 mL. Echogenicity within normal limits. No mass or hydronephrosis visualized. Bladder: Appears normal for degree of bladder distention. Other: None. IMPRESSION: Normal renal ultrasound. Electronically Signed   By: Darliss Cheney M.D.   On: 08/28/2023 19:32   ECHOCARDIOGRAM COMPLETE Result Date: 08/27/2023    ECHOCARDIOGRAM REPORT   Patient Name:   CAMELLE HENKELS Date of Exam: 08/27/2023 Medical Rec #:  [id number]        Height:       68.0 in Accession #:    [id number]       Weight:       400.0 lb Date of Birth:  Mar 12, 1961  BSA:          2.744 m Patient Age:    63 years         BP:           144/72 mmHg Patient Gender: F                HR:           80 bpm. Exam Location:  Inpatient Procedure: 2D Echo, Cardiac Doppler and Color Doppler (Both Spectral and Color            Flow Doppler were utilized during procedure). Indications:    I50.40* Unspecified combined systolic (congestive) and diastolic                 (congestive) heart failure  History:        Patient has no prior history of Echocardiogram examinations.                 Signs/Symptoms:Shortness of Breath and Dyspnea; Risk                 Factors:Diabetes and Hypertension.  Sonographer:    Sheralyn Boatman RDCS Referring Phys: [id number] Leeroy Bock  Sonographer Comments: Technically difficult study due to poor echo windows, suboptimal parasternal window, suboptimal apical window and suboptimal subcostal window. Image acquisition challenging due to patient body habitus. No IV access per patient, stated she only has port. IMPRESSIONS  1. Left ventricular ejection fraction, by estimation, is 60 to 65%. The left ventricle has normal function. Left ventricular endocardial border not optimally defined to evaluate regional wall motion. The left ventricular internal cavity size was moderately to severely dilated. There is mild left ventricular hypertrophy. Left ventricular diastolic parameters are indeterminate.  2. Right ventricular  systolic function is mildly reduced. The right ventricular size is mildly enlarged. There is normal pulmonary artery systolic pressure. The estimated right ventricular systolic pressure is 34.6 mmHg.  3. The mitral valve is degenerative. Trivial mitral valve regurgitation. No evidence of mitral stenosis.  4. The aortic valve is tricuspid. There is mild thickening of the aortic valve. Aortic valve regurgitation is trivial. Aortic valve sclerosis is present, with no evidence of aortic valve stenosis.  5. The inferior vena cava is dilated in size with >50% respiratory variability, suggesting right atrial pressure of 8 mmHg.  6. Increased flow velocities may be secondary to anemia, thyrotoxicosis, hyperdynamic or high flow state, suspect high output state given elevated mitral and aortic valve velocities. FINDINGS  Left Ventricle: Left ventricular ejection fraction, by estimation, is 60 to 65%. The left ventricle has normal function. Left ventricular endocardial border not optimally defined to evaluate regional wall motion. The left ventricular internal cavity size was moderately to severely dilated. There is mild left ventricular hypertrophy. Left ventricular diastolic parameters are indeterminate. Right Ventricle: The right ventricular size is mildly enlarged. Right vetricular wall thickness was not well visualized. Right ventricular systolic function is mildly reduced. There is normal pulmonary artery systolic pressure. The tricuspid regurgitant velocity is 2.58 m/s, and with an assumed right atrial pressure of 8 mmHg, the estimated right ventricular systolic pressure is 34.6 mmHg. Left Atrium: Left atrial size was normal in size. Right Atrium: Right atrial size was not well visualized. Pericardium: There is no evidence of pericardial effusion. Mitral Valve: The mitral valve is degenerative in appearance. Mild to moderate mitral annular calcification. Trivial mitral valve regurgitation. No evidence of mitral valve  stenosis. The mean mitral valve gradient is 4.7 mmHg  with average heart rate of 0  bpm. Tricuspid Valve: The tricuspid valve is grossly normal. Tricuspid valve regurgitation is mild . No evidence of tricuspid stenosis. Aortic Valve: The aortic valve is tricuspid. There is mild thickening of the aortic valve. Aortic valve regurgitation is trivial. Aortic valve sclerosis is present, with no evidence of aortic valve stenosis. Aortic valve mean gradient measures 11.2 mmHg.  Aortic valve peak gradient measures 19.6 mmHg. Aortic valve area, by VTI measures 1.64 cm. Pulmonic Valve: The pulmonic valve was grossly normal. Pulmonic valve regurgitation is trivial. Aorta: The aortic root and ascending aorta are structurally normal, with no evidence of dilitation. Venous: The inferior vena cava is dilated in size with greater than 50% respiratory variability, suggesting right atrial pressure of 8 mmHg. IAS/Shunts: No atrial level shunt detected by color flow Doppler. Additional Comments: 3D imaging was not performed. There is pleural effusion in the left lateral region.  LEFT VENTRICLE PLAX 2D LVIDd:         5.00 cm      Diastology LVIDs:         3.70 cm      LV e' medial:    8.59 cm/s LV PW:         1.20 cm      LV E/e' medial:  14.4 LV IVS:        1.10 cm      LV e' lateral:   4.35 cm/s LVOT diam:     2.20 cm      LV E/e' lateral: 28.4 LV SV:         70 LV SV Index:   25 LVOT Area:     3.80 cm  LV Volumes (MOD) LV vol d, MOD A2C: 146.0 ml LV vol d, MOD A4C: 134.5 ml LV vol s, MOD A2C: 50.6 ml LV vol s, MOD A4C: 51.8 ml LV SV MOD A2C:     95.4 ml LV SV MOD A4C:     134.5 ml LV SV MOD BP:      89.8 ml RIGHT VENTRICLE            IVC RV S prime:     8.81 cm/s  IVC diam: 2.20 cm TAPSE (M-mode): 2.4 cm LEFT ATRIUM            Index LA diam:      5.30 cm  1.93 cm/m LA Vol (A4C): 107.0 ml 39.00 ml/m  AORTIC VALVE AV Area (Vmax):    1.84 cm AV Area (Vmean):   1.67 cm AV Area (VTI):     1.64 cm AV Vmax:           221.42 cm/s AV  Vmean:          154.814 cm/s AV VTI:            0.424 m AV Peak Grad:      19.6 mmHg AV Mean Grad:      11.2 mmHg LVOT Vmax:         107.00 cm/s LVOT Vmean:        67.900 cm/s LVOT VTI:          0.183 m LVOT/AV VTI ratio: 0.43  AORTA Ao Root diam: 3.00 cm Ao Asc diam:  3.30 cm MITRAL VALVE                TRICUSPID VALVE MV Area (PHT): 3.15 cm     TR Peak grad:   26.6 mmHg MV Mean grad:  4.7 mmHg     TR Vmax:        258.00 cm/s MV Decel Time: 241 msec MV E velocity: 123.50 cm/s  SHUNTS MV A velocity: 115.00 cm/s  Systemic VTI:  0.18 m MV E/A ratio:  1.07         Systemic Diam: 2.20 cm Weston Brass MD Electronically signed by Weston Brass MD Signature Date/Time: 08/27/2023/1:41:29 PM    Final    VAS [place] LOWER EXTREMITY VENOUS (DVT) Result Date: 08/26/2023  Lower Venous DVT Study Patient Name:  HIBAH ODONNELL  Date of Exam:   08/26/2023 Medical Rec #: [id number]         Accession #:    [id number] Date of Birth: Jan 09, 1961          Patient Gender: F Patient Age:   65 years Exam Location:  [facility] Procedure:      VAS [place] LOWER EXTREMITY VENOUS (DVT) Referring Phys: Jamelle Rushing --------------------------------------------------------------------------------  Indications: Swelling.  Risk Factors: None identified. Limitations: Body habitus and poor ultrasound/tissue interface. Comparison Study: No prior studies. Performing Technologist: Chanda Busing RVT  Examination Guidelines: A complete evaluation includes B-mode imaging, spectral Doppler, color Doppler, and power Doppler as needed of all accessible portions of each vessel. Bilateral testing is considered an integral part of a complete examination. Limited examinations for reoccurring indications may be performed as noted. The reflux portion of the exam is performed with the patient in reverse Trendelenburg.  +---------+---------------+---------+-----------+----------+--------------+ RIGHT     CompressibilityPhasicitySpontaneityPropertiesThrombus Aging +---------+---------------+---------+-----------+----------+--------------+ CFV      Full           Yes      Yes                                 +---------+---------------+---------+-----------+----------+--------------+ SFJ      Full                                                        +---------+---------------+---------+-----------+----------+--------------+ FV Prox  Full                                                        +---------+---------------+---------+-----------+----------+--------------+ FV Mid   Full                                                        +---------+---------------+---------+-----------+----------+--------------+ FV DistalFull                                                        +---------+---------------+---------+-----------+----------+--------------+ PFV      Full                                                        +---------+---------------+---------+-----------+----------+--------------+  POP      Full           Yes      Yes                                 +---------+---------------+---------+-----------+----------+--------------+ PTV      Full                                                        +---------+---------------+---------+-----------+----------+--------------+ PERO     Full                                                        +---------+---------------+---------+-----------+----------+--------------+   +---------+---------------+---------+-----------+----------+--------------+ LEFT     CompressibilityPhasicitySpontaneityPropertiesThrombus Aging +---------+---------------+---------+-----------+----------+--------------+ CFV      Full           Yes      Yes                                 +---------+---------------+---------+-----------+----------+--------------+ SFJ      Full                                                         +---------+---------------+---------+-----------+----------+--------------+ FV Prox  Full                                                        +---------+---------------+---------+-----------+----------+--------------+ FV Mid   Full                                                        +---------+---------------+---------+-----------+----------+--------------+ FV DistalFull                                                        +---------+---------------+---------+-----------+----------+--------------+ PFV      Full                                                        +---------+---------------+---------+-----------+----------+--------------+ POP      Full           Yes      Yes                                 +---------+---------------+---------+-----------+----------+--------------+  PTV      Full                                                        +---------+---------------+---------+-----------+----------+--------------+ PERO     Full                                                        +---------+---------------+---------+-----------+----------+--------------+     Summary: RIGHT: - There is no evidence of deep vein thrombosis in the lower extremity. However, portions of this examination were limited- see technologist comments above.  - No cystic structure found in the popliteal fossa.  LEFT: - There is no evidence of deep vein thrombosis in the lower extremity. However, portions of this examination were limited- see technologist comments above.  - No cystic structure found in the popliteal fossa.  *See table(s) above for measurements and observations. Electronically signed by Coral Else MD on 08/26/2023 at 8:14:26 PM.    Final    DG Chest 2 View Result Date: 08/26/2023 CLINICAL DATA:  Shortness of breath.  History of stage IV CKD. EXAM: CHEST - 2 VIEW COMPARISON:  None Available. FINDINGS: The cardiac silhouette is enlarged.  Central pulmonary vascular congestion. Limited evaluation of the lateral view due to suboptimal positioning and overlying soft tissues. No appreciable focal consolidation, sizeable pleural effusion, or pneumothorax. No acute osseous abnormality identified. IMPRESSION: Cardiomegaly with central pulmonary vascular congestion. Electronically Signed   By: Hart Robinsons M.D.   On: 08/26/2023 10:38    Microbiology: Results for orders placed or performed during the hospital encounter of 08/26/23  Resp panel by RT-PCR (RSV, Flu A&B, Covid) Anterior Nasal Swab     Status: None   Collection Time: 08/26/23  3:02 PM   Specimen: Anterior Nasal Swab  Result Value Ref Range Status   SARS Coronavirus 2 by RT PCR NEGATIVE NEGATIVE Final    Comment: (NOTE) SARS-CoV-2 target nucleic acids are NOT DETECTED.  The SARS-CoV-2 RNA is generally detectable in upper respiratory specimens during the acute phase of infection. The lowest concentration of SARS-CoV-2 viral copies this assay can detect is 138 copies/mL. A negative result does not preclude SARS-Cov-2 infection and should not be used as the sole basis for treatment or other patient management decisions. A negative result may occur with  improper specimen collection/handling, submission of specimen other than nasopharyngeal swab, presence of viral mutation(s) within the areas targeted by this assay, and inadequate number of viral copies(<138 copies/mL). A negative result must be combined with clinical observations, patient history, and epidemiological information. The expected result is Negative.  Fact Sheet for Patients:  BloggerCourse.com  Fact Sheet for Healthcare Providers:  SeriousBroker.it  This test is no t yet approved or cleared by the [place] FDA and  has been authorized for detection and/or diagnosis of SARS-CoV-2 by FDA under an Emergency Use Authorization (EUA). This EUA will  remain  in effect (meaning this test can be used) for the duration of the COVID-19 declaration under Section 564(b)(1) of the Act, 21 U.S.C.section 360bbb-3(b)(1), unless the authorization is terminated  or revoked sooner.       Influenza A by PCR NEGATIVE  NEGATIVE Final   Influenza B by PCR NEGATIVE NEGATIVE Final    Comment: (NOTE) The Xpert Xpress SARS-CoV-2/FLU/RSV plus assay is intended as an aid in the diagnosis of influenza from Nasopharyngeal swab specimens and should not be used as a sole basis for treatment. Nasal washings and aspirates are unacceptable for Xpert Xpress SARS-CoV-2/FLU/RSV testing.  Fact Sheet for Patients: BloggerCourse.com  Fact Sheet for Healthcare Providers: SeriousBroker.it  This test is not yet approved or cleared by the [place] FDA and has been authorized for detection and/or diagnosis of SARS-CoV-2 by FDA under an Emergency Use Authorization (EUA). This EUA will remain in effect (meaning this test can be used) for the duration of the COVID-19 declaration under Section 564(b)(1) of the Act, 21 U.S.C. section 360bbb-3(b)(1), unless the authorization is terminated or revoked.     Resp Syncytial Virus by PCR NEGATIVE NEGATIVE Final    Comment: (NOTE) Fact Sheet for Patients: BloggerCourse.com  Fact Sheet for Healthcare Providers: SeriousBroker.it  This test is not yet approved or cleared by the [place] FDA and has been authorized for detection and/or diagnosis of SARS-CoV-2 by FDA under an Emergency Use Authorization (EUA). This EUA will remain in effect (meaning this test can be used) for the duration of the COVID-19 declaration under Section 564(b)(1) of the Act, 21 U.S.C. section 360bbb-3(b)(1), unless the authorization is terminated or revoked.  Performed at [facility], [address] [place], [place]  [address]     Labs: CBC: Recent Labs  Lab 08/30/23 0252 09/01/23 0840 09/03/23 0302  WBC 8.7 6.3 6.0  HGB 7.9* 9.1* 9.7*  HCT 26.9* 29.2* 31.7*  MCV 108.5* 103.5* 104.3*  PLT 230 263 234   Basic Metabolic Panel: Recent Labs  Lab 08/29/23 0248 08/30/23 0252 09/01/23 0840 09/02/23 0839 09/03/23 0302 09/04/23 0248  NA 142 137 139 137 135 134*  K 5.0 4.8 3.8 3.4* 3.6 3.8  CL 106 106 102 99 96* 97*  CO2 19* 18* 22 23 23 25   GLUCOSE 78 88 73 71 110* 78  BUN 64* 70* 44* 47* 30* 42*  CREATININE 6.86* 7.23* 5.27* 6.04* 4.80* 6.71*  CALCIUM 9.5 8.8* 8.7* 8.7* 8.5* 8.3*  PHOS 5.9* 5.8*  --  5.0* 4.9* 6.1*   Liver Function Tests: Recent Labs  Lab 08/29/23 0248 08/30/23 0252 09/02/23 0839 09/03/23 0302 09/04/23 0248  ALBUMIN 3.0* 3.0* 3.0* 3.0* 2.8*   CBG: Recent Labs  Lab 09/03/23 0628 09/03/23 1230 09/03/23 2126 09/04/23 0625 09/04/23 1147  GLUCAP 78 94 92 80 95    Discharge time spent: greater than 30 minutes.  Signed: Tyrone Nine, MD [facility] 09/04/2023

## 2023-09-04 NOTE — Progress Notes (Signed)
 Pt had cramping during tx. Pt goal met.  09/04/23 1855  Vitals  Temp 98.8 F (37.1 C)  Temp Source Oral  BP 120/71  MAP (mmHg) 80  BP Location Right Leg  BP Method Automatic  Patient Position (if appropriate) Lying  During Treatment Monitoring  HD Safety Checks Performed Yes  Intra-Hemodialysis Comments Tx completed  Post Treatment  Dialyzer Clearance Lightly streaked  Hemodialysis Intake (mL) 0 mL  Liters Processed 74  Fluid Removed (mL) 4000 mL  Tolerated HD Treatment Yes  Post-Hemodialysis Comments Pt goal met.  Hemodialysis Catheter Right Internal jugular Double lumen Permanent (Tunneled)  Placement Date/Time: 08/31/23 1149   Placed prior to admission: No  Serial / Lot #: [id number]  Expiration Date: 12/05/27  Time Out: Correct patient;Correct procedure;Correct site  Maximum sterile barrier precautions: Hand hygiene;Cap;Mask;Sterile gown...  Site Condition No complications  Blue Lumen Status Heparin locked  Red Lumen Status Heparin locked  Catheter fill solution Heparin 1000 units/ml  Catheter fill volume (Arterial) 2.1 cc  Catheter fill volume (Venous) 2.1  Dressing Type Transparent  Dressing Status Antimicrobial disc/dressing in place;Clean, Dry, Intact  Interventions New dressing  Drainage Description None  Dressing Change Due 08/31/23  Post treatment catheter status Capped and Clamped

## 2023-09-04 NOTE — Discharge Planning (Signed)
 [facility]  Initial Hemodialysis Orders  Dialysis center: [facility]  Patient's name: Erin Davila DOB: [birth year] AKI or ESRD: ESRD  Discharge diagnosis: Acute hypoxic respiratory failure 2.   Volume overload  Allergies:  Allergies  Allergen Reactions   Lactose Other (See Comments)    GI complaints    Date of First Dialysis: 08/31/2023 Cause of renal disease: DMT2/HTN  Dialysis Prescription: Dialysis Frequency: M,T, Th,S Tx duration: 3 hrs BFR: 350 DFR: autoflow 1.5  EDW: 178 kg  Dialyzer: 180NRe UF profile/Sodium modeling?: Na Dialysis Bath: 2.0 K 2.5 Ca  Dialysis access: Access type: Left brachiocephalic arteriovenous fistula creation  Date placed: 05/06/2023 Surgeon: Dr. Lenell Antu Needle gauge: NA RIJ TDC placed 08/31/2023 Dr. Miles Costain IR  In Center Medications: Heparin Dose: No Heparin   Type: NA VDRA: Calcitriol per protocol Venofer: Per protocol Mircera: per protocol  Sensipar: NA  Discharge labs: Hgb: 9.7 K+: 3.8  Ca: 8.3  Phos: 6.1 Alb: 2.8  Please draw MONTHLY LABS on admission Dietician to see pt for low albumin  Erin Davila [facility] [phone]

## 2023-09-04 NOTE — Progress Notes (Signed)
 D/C order noted. Contacted TCU RN at [facility] to be made aware of pt's d/c today and that pt should start on Monday. Arrangements discussed with pt yesterday and added HD info to AVS. Contacted renal NP regarding clinic's need for orders.   Olivia Canter Renal Navigator [phone]

## 2023-09-04 NOTE — Plan of Care (Signed)

## 2023-09-04 NOTE — Progress Notes (Addendum)
 [facility]  Subjective  - Pain is improved today   Objective (!) 153/63 80 99.1 F (37.3 C) (Oral) 20 99%  Intake/Output Summary (Last 24 hours) at 09/04/2023 0835 Last data filed at 09/04/2023 0102 Gross per 24 hour  Intake 240 ml  Output 100 ml  Net 140 ml    Motor intact, palpable radial pulse  Steri strips in place   Assessment/Planning: POD #2  Revision of left arm arteriovenous fistula   Improved pain and function I will schedule a f/u visit with our office She is currently on HD via TDC placed in IR.   F/U in 2-3 weeks with duplex  Mosetta Pigeon 09/04/2023 8:35 AM --  Laboratory Lab Results: Recent Labs    09/01/23 0840 09/03/23 0302  WBC 6.3 6.0  HGB 9.1* 9.7*  HCT 29.2* 31.7*  PLT 263 234   BMET Recent Labs    09/03/23 0302 09/04/23 0248  NA 135 134*  K 3.6 3.8  CL 96* 97*  CO2 23 25  GLUCOSE 110* 78  BUN 30* 42*  CREATININE 4.80* 6.71*  CALCIUM 8.5* 8.3*    COAG No results found for: "INR", "PROTIME" No results found for: "PTT"

## 2023-09-04 NOTE — TOC Transition Note (Addendum)
 Transition of Care [facility]) - Discharge Note   Patient Details  Name: Erin Davila MRN: [id number] Date of Birth: May 07, 1961  [facility]) CM/SW Contact:  Leone Haven, RN Phone Number: 09/04/2023, 12:37 PM   Clinical Narrative:    For dc today, has OP HD set up per renal navigator, Has transport at dc. NCM received message that patient will need bariatric rollator and HHPT.  NCM offered choice she has no preference.  Patient will have co pay for HHPT, she states she would rather do outpatient pt.  NCM spoke with physical therapist, Benjamine Mola who worked with patient to see if he agrees with her going to OP PT, he states yes he feels she can do the OP PT.  Patient would like to go to the [facility] OP rehab center, referral made thru epic .  Patient sister will transport her home per son in the room.         Patient Goals and CMS Choice            Discharge Placement                       Discharge Plan and Services Additional resources added to the After Visit Summary for                                       Social Drivers of Health (SDOH) Interventions SDOH Screenings   Food Insecurity: No Food Insecurity (08/26/2023)  Housing: Low Risk  (08/26/2023)  Transportation Needs: No Transportation Needs (08/26/2023)  Utilities: Not At Risk (08/26/2023)  Depression (PHQ2-9): Low Risk  (03/08/2021)  Tobacco Use: Low Risk  (08/30/2023)     Readmission Risk Interventions    08/31/2023    5:12 PM  Readmission Risk Prevention Plan  Transportation Screening Complete  Home Care Screening Complete  Medication Review (RN CM) Complete

## 2023-09-04 NOTE — Progress Notes (Signed)
 [facility] Progress Note    Assessment/ Plan:   AKI on CKD 5, now ESRD - b/l creatinine is 5.9 from oct 2024. Creat here 5.4 on admission in the setting of new-onset vol overload w/ pulm edema. UA w/ mild hematuria/ pyuria, [place] w/o obstruction. AKI due to CHF vs progression of CKD. Did not respond to 100 lasix IV tid and is making urine on Lasix 160mg  but still not enough. Creat rising.   -did not robustly respond to aggressive/high dose diuretics therefore IR consulted for [facility] placement-placed 2/24 (AVF not ready to use). Slow start protocol--HD#1 2/24-2/25. HD#2 2/26. HD#3 2/28.  -clip: [facility] Monday, Tuesday, Thursday, Friday.  8:30 AM. Can start 2/28 Volume overload/ acute hypoxic resp failure - 1st time w/ volume overload like this. . CXR showed vasc congestion and sig LE edema by exam. ECHO showed nl LV, mild RV failure. Seen by cardiology, appreciate assistance.  Will plan for dialysis to manage volume. Cont diuretics, switching to PO.  Torsemide 40 mg twice daily.  On discharge recommend discharging her on torsemide 40 mg twice daily on off dialysis days HTN - Will UF as tolerated with HD HD access - AVF surgery done in October 2024, status post superficialization and branch ligation on 2/26 with Dr. Lenell Antu. Utilizing [facility] in the interim  Would be okay for discharge from a renal perspective post-HD today or tomorrow when feasible.    Subjective:   Patient seen and examined bedside. No complaints, she reports breathing is much better. Pain in arm is much better   Objective:   BP (!) 153/63 (BP Location: Right Leg)   Pulse 80   Temp 99.1 F (37.3 C) (Oral)   Resp 20   Ht 5\' 8"  (1.727 m)   Wt (!) 178.5 kg   LMP 03/26/2015   SpO2 99%   BMI 59.83 kg/m   Intake/Output Summary (Last 24 hours) at 09/04/2023 1021 Last data filed at 09/04/2023 0102 Gross per 24 hour  Intake 120 ml  Output 100 ml  Net 20 ml   Weight change: -2.939 kg  Physical Exam: Gen: NAD, in  chair CVS: RRR Resp: normal wob, unlabored Abd: obese, soft, nt Ext: trace pitting edema superimposed on nonpitting b/l LEs Neuro: awake, alert Dialysis access: LUE AVF +b/t (dressing in place), +TDC  Imaging: No results found.   Labs: BMET Recent Labs  Lab 08/29/23 0248 08/30/23 0252 09/01/23 0840 09/02/23 0839 09/03/23 0302 09/04/23 0248  NA 142 137 139 137 135 134*  K 5.0 4.8 3.8 3.4* 3.6 3.8  CL 106 106 102 99 96* 97*  CO2 19* 18* 22 23 23 25   GLUCOSE 78 88 73 71 110* 78  BUN 64* 70* 44* 47* 30* 42*  CREATININE 6.86* 7.23* 5.27* 6.04* 4.80* 6.71*  CALCIUM 9.5 8.8* 8.7* 8.7* 8.5* 8.3*  PHOS 5.9* 5.8*  --  5.0* 4.9* 6.1*   CBC Recent Labs  Lab 08/30/23 0252 09/01/23 0840 09/03/23 0302  WBC 8.7 6.3 6.0  HGB 7.9* 9.1* 9.7*  HCT 26.9* 29.2* 31.7*  MCV 108.5* 103.5* 104.3*  PLT 230 263 234    Medications:     amLODipine  10 mg Oral QHS   atorvastatin  20 mg Oral QHS   calcitRIOL  0.5 mcg Oral Daily   carvedilol  6.25 mg Oral BID WC   Chlorhexidine Gluconate Cloth  6 each Topical Q0600   feeding supplement  1 Container Oral BID BM   heparin  5,000  Units Subcutaneous Q8H   hydrALAZINE  50 mg Oral Q8H   insulin aspart  0-15 Units Subcutaneous TID WC   insulin aspart  0-5 Units Subcutaneous QHS   insulin glargine  15 Units Subcutaneous Daily   montelukast  10 mg Oral QHS   multivitamin  1 tablet Oral QHS   polyethylene glycol  17 g Oral Daily   torsemide  40 mg Oral BID      Anthony Sar, MD [facility] 09/04/2023, 10:21 AM

## 2023-09-04 NOTE — Progress Notes (Signed)
 Discharge instructions given to patient, patient verbalized understanding. Patient discharged to home in stable condition.

## 2023-09-05 ENCOUNTER — Telehealth: Payer: Self-pay | Admitting: Nephrology

## 2023-09-05 NOTE — Telephone Encounter (Signed)
 Transition of Care Contact from Inpatient Facility   Date of Discharge: 09/04/23  Date of Contact: 09/05/23 Method of contact: phone Talked to patient   Patient contacted to discuss transition of care form recent hospitaliztion. Patient was admitted to [facility] from 08/26/23 to 09/04/23 with the discharge diagnosis of Acute hypoxic respiratory failure and new ESRD on HD.    Medication changes were reviewed. Advised to hold carvedilol and hydralazine in morning prior to dialysis and take amlodipine at night.  Told patient she could stop PO iron.    Patient will follow up with is outpatient dialysis center 09/07/23. Recommend to bring pillow and blanket for increased comfort during dialysis.   Other follow up needs include none identified.   Virgina Norfolk, PA-C [employer]

## 2023-09-07 ENCOUNTER — Inpatient Hospital Stay (HOSPITAL_COMMUNITY): Admission: RE | Admit: 2023-09-07 | Payer: 59 | Source: Ambulatory Visit

## 2023-09-14 ENCOUNTER — Encounter: Payer: Self-pay | Admitting: Vascular Surgery

## 2023-09-16 ENCOUNTER — Other Ambulatory Visit: Payer: Self-pay

## 2023-09-16 DIAGNOSIS — N185 Chronic kidney disease, stage 5: Secondary | ICD-10-CM

## 2023-09-21 ENCOUNTER — Encounter (HOSPITAL_COMMUNITY): Payer: 59

## 2023-09-22 ENCOUNTER — Ambulatory Visit (HOSPITAL_COMMUNITY)
Admission: RE | Admit: 2023-09-22 | Discharge: 2023-09-22 | Disposition: A | Discharge status: Home / Self Care | Payer: 59 | Source: Ambulatory Visit | Attending: Vascular Surgery | Admitting: Vascular Surgery

## 2023-09-22 DIAGNOSIS — N185 Chronic kidney disease, stage 5: Secondary | ICD-10-CM | POA: Insufficient documentation

## 2023-09-23 ENCOUNTER — Other Ambulatory Visit: Payer: Self-pay

## 2023-09-23 ENCOUNTER — Ambulatory Visit: Attending: Family Medicine | Admitting: Physical Therapy

## 2023-09-23 ENCOUNTER — Encounter: Payer: Self-pay | Admitting: Physical Therapy

## 2023-09-23 DIAGNOSIS — R2689 Other abnormalities of gait and mobility: Secondary | ICD-10-CM

## 2023-09-23 DIAGNOSIS — M6281 Muscle weakness (generalized): Secondary | ICD-10-CM

## 2023-09-23 NOTE — Patient Instructions (Signed)
 Access Code: [email or web address] URL: [email or web address] Date: 09/23/2023 Prepared by: Rosana Hoes  Exercises - Sit to Stand  - 3 x daily - 5 reps - Seated Hip Abduction with Resistance  - 1 x daily - 2 sets - 15 reps

## 2023-09-23 NOTE — Therapy (Signed)
 OUTPATIENT PHYSICAL THERAPY EVALUATION   Patient Name: Erin Davila MRN: [id number] DOB:[birth year], 63 y.o., female Today's Date: 09/23/2023   END OF SESSION:  PT End of Session - 09/23/23 0934     Visit Number 1    Number of Visits 1    Date for PT Re-Evaluation 09/23/23    Authorization Type [employer]    PT Start Time 0800    PT Stop Time 0840    PT Time Calculation (min) 40 min    Activity Tolerance Patient tolerated treatment well    Behavior During Therapy WFL for tasks assessed/performed             Past Medical History:  Diagnosis Date   Allergy    Anemia of chronic kidney failure    stage 5   Diabetes mellitus without complication (HCC)    type 2   GERD (gastroesophageal reflux disease)    History of iron deficiency anemia    Hyperlipidemia    Hypertension    Past Surgical History:  Procedure Laterality Date   AV FISTULA PLACEMENT Left 05/06/2023   Procedure: LEFT ARM RADIOCEPHALIC ARTERIOVENOUS (AV) FISTULA CREATION;  Surgeon: [name], MD;  Location: [facility] OR;  Service: Vascular;  Laterality: Left;   CESAREAN SECTION     x 1   INCISION AND DRAINAGE ABSCESS N/A 01/04/2021   Procedure: INCISION AND DRAINAGE LEFT BUTTOCK ABSCESS;  Surgeon: Luretha Murphy, MD;  Location: [facility] ORS;  Service: General;  Laterality: N/A;   IR FLUORO GUIDE CV LINE RIGHT  08/31/2023   IR US GUIDE VASC ACCESS RIGHT  08/31/2023   REVISON OF ARTERIOVENOUS FISTULA Left 09/02/2023   Procedure: REVISON OF ARTERIOVENOUS FISTULA LEFT;  Surgeon: [name], MD;  Location: [facility] OR;  Service: Vascular;  Laterality: Left;   Patient Active Problem List   Diagnosis Date Noted   Hypoxia 08/26/2023   Anemia, iron deficiency 04/02/2023   Anemia of chronic kidney failure 04/02/2023   Debility 01/16/2021   Sepsis (HCC) 01/10/2021   Left buttock abscess 01/04/2021   Cellulitis and abscess of buttock 01/03/2021   Hyperglycemia due to diabetes mellitus (HCC) 01/03/2021   Trochanteric  bursitis of both hips 02/01/2015   Type 2 diabetes mellitus with obesity (HCC) 07/31/2014   HTN (hypertension) 07/31/2014   Morbid obesity (HCC) 07/31/2014    PCP: [name], MD  REFERRING PROVIDER: Tyrone Nine, MD  REFERRING DIAG: Unsteadiness on feet (R26.81); Other abnormalities of gait and mobility (R26.89); Muscle weakness (generalized) (M62.81); Difficulty in walking, not elsewhere classified (R26.2); Pain Pain - Right/Left: Left Pain - part of body: Arm  THERAPY DIAG:  Muscle weakness (generalized)  Other abnormalities of gait and mobility  Rationale for Evaluation and Treatment: Rehabilitation  ONSET DATE: 08/26/2023   SUBJECTIVE:  SUBJECTIVE STATEMENT: Patient reports she was in the hospital for 9 days and didn't do a lot of walking. She is now on dialysis and drives herself. She was seen by PT in the hospital where she just was walked up and down the hall. Currently she feels like she is better than when she was in the hospital. She hasn't gone back to work yet because she wants to get her schedule with dialysis. She does walk with a cane because she has bursitis in both her hips, but if she is walking a short distances she doesn't use it. She does have a little trouble going up/down stairs but it continues to improve every time she does it. She reports  normal pain in her hips that has been ongoing for about 10 years, but this has not changed. States that the cold weather or rain is the main thing that affects her hips. 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   PERTINENT HISTORY:  See PMH above  PAIN:  Are you having pain?  Yes: NPRS scale: 0/10 Pain location: Bilateral hips Pain description: Aching  Aggravating factors: Cold or rainy weather, walking, stairs Relieving factors: Rest  PRECAUTIONS: None  RED FLAGS: None   WEIGHT BEARING RESTRICTIONS:  No  FALLS:  Has patient fallen in last 6 months? 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 PATIENT SURVEYS:  Not assessed  COGNITION: Overall cognitive status: Within functional limits for tasks assessed     SENSATION: WFL  EDEMA:  None observable  MUSCLE LENGTH: Not assessed  POSTURE:   Rounded shoulder posture  PALPATION: Not assessed  LOWER EXTREMITY ROM:   Not assessed  LOWER EXTREMITY MMT:  MMT Right eval Left eval  Hip flexion 4- 4-  Hip extension    Hip abduction 3- 3-  Hip adduction    Hip internal rotation    Hip external rotation    Knee flexion 4 4  Knee extension 4 4  Ankle dorsiflexion    Ankle plantarflexion    Ankle inversion    Ankle eversion     (Blank rows = not tested)  LOWER EXTREMITY SPECIAL TESTS:  Not assessed  FUNCTIONAL TESTS:  30 seconds chair stand test: 4 reps 2 minute walk test: 320 ft using SPC  GAIT: Distance walked: 320 ft Assistive device utilized: Single point cane Level of assistance: Complete Independence Comments: Trendelenburg                                                                                                                               TREATMENT  [facility] Adult PT Treatment:                                                DATE: 09/23/2023 Sit to stand x 5 Seated clamshell with green x 15 Educated patient on walking program and strengthening exercises so she can progress her endurance, strength, and walking tolerance  PATIENT EDUCATION:  Education details: Exam findings, POC, HEP Person educated:  Patient Education method: Explanation, Demonstration, Tactile  cues, Verbal cues, and Handouts Education comprehension: verbalized understanding, returned demonstration, verbal cues required, tactile cues required, and needs further education  HOME EXERCISE PROGRAM: Access Code: [email or web address]   ASSESSMENT: CLINICAL IMPRESSION: Patient is a 63 y.o. female who was seen today for physical therapy evaluation and treatment following recent hospitalization. Currently she is reporting no functional limitations and feels better than prior to going to the hospital. Patient reports that she felt great with the and reported no pain with any tests or measures this visit. She does exhibit deficits with her walking endurance and LE strength but at this time patient will be provided an HEP and she will not continue with PT as she feels confident in her ability to independently progress her strength, endurance, and walking ability. Time was spent educating patient on walking program and methods to progress her strength and endurance so she can feel more confident with walking into her office or attending her grandson's graduation.    OBJECTIVE IMPAIRMENTS: Abnormal gait, decreased activity tolerance, decreased strength, and pain.   ACTIVITY LIMITATIONS: standing, stairs, and locomotion level  PARTICIPATION LIMITATIONS: community activity and occupation  PERSONAL FACTORS: Past/current experiences and Time since onset of injury/illness/exacerbation are also affecting patient's functional outcome.   REHAB POTENTIAL: Excellent  CLINICAL DECISION MAKING: Stable/uncomplicated  EVALUATION COMPLEXITY: Low   GOALS: Goals reviewed with patient? Yes  SHORT TERM GOALS: Target date: 09/23/2023  Patient will be I with initial HEP in order to progress with therapy. Baseline: HEP provided at eval Goal status: MET   PLAN: PT FREQUENCY: one time visit  PT DURATION: N/A  PLANNED INTERVENTIONS:  97110-Therapeutic exercises, 97530- Therapeutic activity, [id number]- Neuromuscular re-education, [phone]- Self Care, [id number]- Manual therapy, and [phone]- Gait training  PLAN FOR NEXT SESSION: NA   Rosana Hoes, PT, DPT, LAT, ATC 09/23/23  9:44 AM Phone: [phone] Fax: [phone]   PHYSICAL THERAPY DISCHARGE SUMMARY  Visits from Start of Care: 1  Current functional level related to goals / functional outcomes: See above   Remaining deficits: See above   Education / Equipment: HEP   Patient agrees to discharge. Patient goals were met. Patient is being discharged due to  one time visit.

## 2023-10-02 ENCOUNTER — Ambulatory Visit (INDEPENDENT_AMBULATORY_CARE_PROVIDER_SITE_OTHER): Admitting: Physician Assistant

## 2023-10-02 ENCOUNTER — Other Ambulatory Visit: Payer: Self-pay

## 2023-10-02 VITALS — BP 130/83 | HR 82 | Temp 97.5°F | Ht 68.0 in | Wt 378.5 lb

## 2023-10-02 DIAGNOSIS — Z992 Dependence on renal dialysis: Secondary | ICD-10-CM

## 2023-10-02 DIAGNOSIS — T82898A Other specified complication of vascular prosthetic devices, implants and grafts, initial encounter: Secondary | ICD-10-CM

## 2023-10-02 DIAGNOSIS — N186 End stage renal disease: Secondary | ICD-10-CM

## 2023-10-02 NOTE — Progress Notes (Signed)
 POST OPERATIVE OFFICE NOTE    CC:  F/u for surgery  HPI:  Erin Davila is a 63 y.o. female who presents today for postop visit.  She recently underwent revision of left upper arm fistula with superficialization and branch ligation on 09/02/2023 by Dr. Lenell Antu.  This was done for a slow to mature left brachiocephalic fistula that was first created on 05/06/2023.  Pt returns today for follow up.  Pt states she has been feeling fine since surgery.  She denies any issues with her incisions such as redness, tenderness, or drainage.  She denies any fevers or chills.  She denies any symptoms of steal in the left hand such as weakness, numbness, excessive coldness, or pain.  She is on dialysis now via [facility].   Allergies  Allergen Reactions   Lactose Other (See Comments)    GI complaints    Current Outpatient Medications  Medication Sig Dispense Refill   acetaminophen (TYLENOL) 500 MG tablet Take 500-1,000 mg by mouth every 6 (six) hours as needed for mild pain (pain score 1-3), moderate pain (pain score 4-6) or headache.     amLODipine (NORVASC) 10 MG tablet Take 10 mg by mouth at bedtime.     calcitRIOL (ROCALTROL) 0.5 MCG capsule Take 1 capsule (0.5 mcg total) by mouth daily. 30 capsule 0   carvedilol (COREG) 6.25 MG tablet Take 6.25 mg by mouth 2 (two) times daily with a meal.     ferrous sulfate 325 (65 FE) MG tablet Take 325 mg by mouth 3 (three) times a week.     hydrALAZINE (APRESOLINE) 50 MG tablet Take 50 mg by mouth in the morning and at bedtime.     hyoscyamine (LEVSIN) 0.125 MG/5ML ELIX Take 10 mLs (0.25 mg total) by mouth every 6 (six) hours as needed for cramping (abdominal pain/discomfort). 600 mL 0   insulin glargine (LANTUS SOLOSTAR) 100 UNIT/ML Solostar Pen Inject 38 Units into the skin at bedtime. 15 mL 0   montelukast (SINGULAIR) 10 MG tablet Take 1 tablet (10 mg total) by mouth at bedtime. 30 tablet 0   multivitamin (RENA-VIT) TABS tablet Take 1 tablet by mouth at bedtime.  30 tablet 0   OZEMPIC, 2 MG/DOSE, 8 MG/3ML SOPN Inject 2 mg into the skin every Monday.     pantoprazole (PROTONIX) 40 MG tablet Take 1 tablet (40 mg total) by mouth daily. (Patient taking differently: Take 40 mg by mouth daily after lunch.) 30 tablet 0   simvastatin (ZOCOR) 40 MG tablet Take 1 tablet (40 mg total) by mouth at bedtime. 30 tablet 0   torsemide 40 MG TABS Take 40 mg by mouth 2 (two) times daily. on non-dialysis days 60 tablet 0   No current facility-administered medications for this visit.     ROS:  See HPI  Physical Exam:  Incision: Left upper arm incisions nearly healed without signs of infection or drainage Extremities: Left brachiocephalic fistula with easy to palpate thrill at the [facility] fossa.  The thrill within the fistula becomes very difficult to palpate throughout the rest of the upper arm.  I can auscultate a bruit within the fistula in the upper arm, however it is weak.  2+ left radial pulse Neuro: Intact motor and sensation of left upper extremity  Dialysis duplex: (09/22/2023)  +--------------------+----------+-----------------+--------+  AVF                PSV (cm/s)Flow Vol (mL/min)Comments  +--------------------+----------+-----------------+--------+  Native artery inflow   151  563                 +--------------------+----------+-----------------+--------+  AVF Anastomosis        296                               +--------------------+----------+-----------------+--------+     +------------+----------+-------------+----------+--------+  OUTFLOW VEINPSV (cm/s)Diameter (cm)Depth (cm)Describe  +------------+----------+-------------+----------+--------+  Prox UA         72        0.61        1.20             +------------+----------+-------------+----------+--------+  Mid UA         101        0.54        0.43             +------------+----------+-------------+----------+--------+  Dist UA        308        1.46         0.27             +------------+----------+-------------+----------+--------+    Assessment/Plan:  This is a 63 y.o. female who is here for a postop visit  -The patient recently underwent superficialization and branch ligation of a left brachiocephalic fistula.  This was done for a slow to mature fistula with poor flow volume -Her left upper arm incisions are well-healed without signs of infection.  She denies any drainage or redness from these incisions.  She denies any fevers or chills -She denies any symptoms of steal in the left hand such as weakness, numbness, excessive coldness, or pain.  She has a 2+ left radial pulse -Duplex demonstrates a patent fistula with decent size diameter throughout most of the arm.  Unfortunately there are still low flow volumes within the fistula, at 563 mL/min. -On exam her fistula has a great thrill at the [facility] fossa and then the thrill becomes very difficult to feel throughout the rest of the upper arm.  I can auscultate a bruit within the fistula throughout the upper arm, however it is fairly weak.  I do not think that the current flow volumes within the fistula would support dialysis -I have explained to the patient that the best next step would be to pursue left brachiocephalic fistulogram with possible intervention.  Hopefully we can improve flow volumes within the fistula so this can be used for dialysis access.  We can schedule her for a fistulogram within the next couple of weeks with Dr. Lenell Antu.  For the time being her Practice [facility] can be used for dialysis   Loel Dubonnet, PA-C [facility] [phone]  Clinic MD:  Hetty Blend

## 2023-10-02 NOTE — H&P (View-Only) (Signed)
 POST OPERATIVE OFFICE NOTE    CC:  F/u for surgery  HPI:  Erin Davila is a 63 y.o. female who presents today for postop visit.  She recently underwent revision of left upper arm fistula with superficialization and branch ligation on 09/02/2023 by Dr. Lenell Antu.  This was done for a slow to mature left brachiocephalic fistula that was first created on 05/06/2023.  Pt returns today for follow up.  Pt states she has been feeling fine since surgery.  She denies any issues with her incisions such as redness, tenderness, or drainage.  She denies any fevers or chills.  She denies any symptoms of steal in the left hand such as weakness, numbness, excessive coldness, or pain.  She is on dialysis now via [facility].   Allergies  Allergen Reactions   Lactose Other (See Comments)    GI complaints    Current Outpatient Medications  Medication Sig Dispense Refill   acetaminophen (TYLENOL) 500 MG tablet Take 500-1,000 mg by mouth every 6 (six) hours as needed for mild pain (pain score 1-3), moderate pain (pain score 4-6) or headache.     amLODipine (NORVASC) 10 MG tablet Take 10 mg by mouth at bedtime.     calcitRIOL (ROCALTROL) 0.5 MCG capsule Take 1 capsule (0.5 mcg total) by mouth daily. 30 capsule 0   carvedilol (COREG) 6.25 MG tablet Take 6.25 mg by mouth 2 (two) times daily with a meal.     ferrous sulfate 325 (65 FE) MG tablet Take 325 mg by mouth 3 (three) times a week.     hydrALAZINE (APRESOLINE) 50 MG tablet Take 50 mg by mouth in the morning and at bedtime.     hyoscyamine (LEVSIN) 0.125 MG/5ML ELIX Take 10 mLs (0.25 mg total) by mouth every 6 (six) hours as needed for cramping (abdominal pain/discomfort). 600 mL 0   insulin glargine (LANTUS SOLOSTAR) 100 UNIT/ML Solostar Pen Inject 38 Units into the skin at bedtime. 15 mL 0   montelukast (SINGULAIR) 10 MG tablet Take 1 tablet (10 mg total) by mouth at bedtime. 30 tablet 0   multivitamin (RENA-VIT) TABS tablet Take 1 tablet by mouth at bedtime.  30 tablet 0   OZEMPIC, 2 MG/DOSE, 8 MG/3ML SOPN Inject 2 mg into the skin every Monday.     pantoprazole (PROTONIX) 40 MG tablet Take 1 tablet (40 mg total) by mouth daily. (Patient taking differently: Take 40 mg by mouth daily after lunch.) 30 tablet 0   simvastatin (ZOCOR) 40 MG tablet Take 1 tablet (40 mg total) by mouth at bedtime. 30 tablet 0   torsemide 40 MG TABS Take 40 mg by mouth 2 (two) times daily. on non-dialysis days 60 tablet 0   No current facility-administered medications for this visit.     ROS:  See HPI  Physical Exam:  Incision: Left upper arm incisions nearly healed without signs of infection or drainage Extremities: Left brachiocephalic fistula with easy to palpate thrill at the [facility] fossa.  The thrill within the fistula becomes very difficult to palpate throughout the rest of the upper arm.  I can auscultate a bruit within the fistula in the upper arm, however it is weak.  2+ left radial pulse Neuro: Intact motor and sensation of left upper extremity  Dialysis duplex: (09/22/2023)  +--------------------+----------+-----------------+--------+  AVF                PSV (cm/s)Flow Vol (mL/min)Comments  +--------------------+----------+-----------------+--------+  Native artery inflow   151  563                 +--------------------+----------+-----------------+--------+  AVF Anastomosis        296                               +--------------------+----------+-----------------+--------+     +------------+----------+-------------+----------+--------+  OUTFLOW VEINPSV (cm/s)Diameter (cm)Depth (cm)Describe  +------------+----------+-------------+----------+--------+  Prox UA         72        0.61        1.20             +------------+----------+-------------+----------+--------+  Mid UA         101        0.54        0.43             +------------+----------+-------------+----------+--------+  Dist UA        308        1.46         0.27             +------------+----------+-------------+----------+--------+    Assessment/Plan:  This is a 63 y.o. female who is here for a postop visit  -The patient recently underwent superficialization and branch ligation of a left brachiocephalic fistula.  This was done for a slow to mature fistula with poor flow volume -Her left upper arm incisions are well-healed without signs of infection.  She denies any drainage or redness from these incisions.  She denies any fevers or chills -She denies any symptoms of steal in the left hand such as weakness, numbness, excessive coldness, or pain.  She has a 2+ left radial pulse -Duplex demonstrates a patent fistula with decent size diameter throughout most of the arm.  Unfortunately there are still low flow volumes within the fistula, at 563 mL/min. -On exam her fistula has a great thrill at the [facility] fossa and then the thrill becomes very difficult to feel throughout the rest of the upper arm.  I can auscultate a bruit within the fistula throughout the upper arm, however it is fairly weak.  I do not think that the current flow volumes within the fistula would support dialysis -I have explained to the patient that the best next step would be to pursue left brachiocephalic fistulogram with possible intervention.  Hopefully we can improve flow volumes within the fistula so this can be used for dialysis access.  We can schedule her for a fistulogram within the next couple of weeks with Dr. Lenell Antu.  For the time being her Practice [facility] can be used for dialysis   Loel Dubonnet, PA-C [facility] [phone]  Clinic MD:  Hetty Blend

## 2023-10-11 ENCOUNTER — Encounter: Payer: Self-pay | Admitting: Physician Assistant

## 2023-10-13 ENCOUNTER — Encounter (INDEPENDENT_AMBULATORY_CARE_PROVIDER_SITE_OTHER): Payer: Self-pay

## 2023-10-23 ENCOUNTER — Encounter (HOSPITAL_COMMUNITY)
Admission: RE | Disposition: A | Discharge status: Home / Self Care | Payer: Self-pay | Source: Ambulatory Visit | Attending: Vascular Surgery

## 2023-10-23 ENCOUNTER — Other Ambulatory Visit: Payer: Self-pay

## 2023-10-23 ENCOUNTER — Ambulatory Visit (HOSPITAL_COMMUNITY)
Admission: RE | Admit: 2023-10-23 | Discharge: 2023-10-23 | Disposition: A | Discharge status: Home / Self Care | Source: Ambulatory Visit | Attending: Vascular Surgery | Admitting: Vascular Surgery

## 2023-10-23 DIAGNOSIS — Z992 Dependence on renal dialysis: Secondary | ICD-10-CM | POA: Insufficient documentation

## 2023-10-23 DIAGNOSIS — Y832 Surgical operation with anastomosis, bypass or graft as the cause of abnormal reaction of the patient, or of later complication, without mention of misadventure at the time of the procedure: Secondary | ICD-10-CM | POA: Diagnosis not present

## 2023-10-23 DIAGNOSIS — T82898A Other specified complication of vascular prosthetic devices, implants and grafts, initial encounter: Secondary | ICD-10-CM | POA: Insufficient documentation

## 2023-10-23 DIAGNOSIS — N186 End stage renal disease: Secondary | ICD-10-CM | POA: Diagnosis not present

## 2023-10-23 DIAGNOSIS — N185 Chronic kidney disease, stage 5: Secondary | ICD-10-CM

## 2023-10-23 HISTORY — PX: A/V SHUNT INTERVENTION: CATH118220

## 2023-10-23 HISTORY — PX: A/V FISTULAGRAM: CATH118298

## 2023-10-23 LAB — POCT I-STAT, CHEM 8
BUN: 23 mg/dL (ref 8–23)
Calcium, Ion: 1 mmol/L — ABNORMAL LOW (ref 1.15–1.40)
Chloride: 96 mmol/L — ABNORMAL LOW (ref 98–111)
Creatinine, Ser: 4.7 mg/dL — ABNORMAL HIGH (ref 0.44–1.00)
Glucose, Bld: 144 mg/dL — ABNORMAL HIGH (ref 70–99)
HCT: 38 % (ref 36.0–46.0)
Hemoglobin: 12.9 g/dL (ref 12.0–15.0)
Potassium: 3.6 mmol/L (ref 3.5–5.1)
Sodium: 138 mmol/L (ref 135–145)
TCO2: 32 mmol/L (ref 22–32)

## 2023-10-23 LAB — GLUCOSE, CAPILLARY: Glucose-Capillary: 133 mg/dL — ABNORMAL HIGH (ref 70–99)

## 2023-10-23 SURGERY — A/V FISTULAGRAM
Anesthesia: LOCAL

## 2023-10-23 MED ORDER — LIDOCAINE HCL (PF) 1 % IJ SOLN
INTRAMUSCULAR | Status: AC
  Filled 2023-10-23: qty 30

## 2023-10-23 MED ORDER — LIDOCAINE HCL (PF) 1 % IJ SOLN
INTRAMUSCULAR | Status: DC | PRN
  Administered 2023-10-23 (×2): 5 mL via INTRADERMAL

## 2023-10-23 MED ORDER — IODIXANOL 320 MG/ML IV SOLN
INTRAVENOUS | Status: DC | PRN
  Administered 2023-10-23: 55 mL

## 2023-10-23 MED ORDER — SODIUM CHLORIDE 0.9% FLUSH: 3.0000 mL | INTRAVENOUS | Status: DC | PRN

## 2023-10-23 MED ORDER — HEPARIN (PORCINE) IN NACL 1000-0.9 UT/500ML-% IV SOLN
INTRAVENOUS | Status: DC | PRN
  Administered 2023-10-23: 500 mL

## 2023-10-23 SURGICAL SUPPLY — 13 items
BALLN MUSTANG 7.0X20 75 (BALLOONS) ×1 IMPLANT
BALLOON MUSTANG 7.0X20 75 (BALLOONS) IMPLANT
COVER DOME SNAP 22 D (MISCELLANEOUS) ×1 IMPLANT
KIT ENCORE 26 ADVANTAGE (KITS) IMPLANT
KIT MICROPUNCTURE NIT STIFF (SHEATH) IMPLANT
KIT PV (KITS) ×1 IMPLANT
SHEATH PINNACLE R/O II 7F 4CM (SHEATH) IMPLANT
SHEATH PROBE COVER 6X72 (BAG) ×1 IMPLANT
STOPCOCK MORSE 400PSI 3WAY (MISCELLANEOUS) ×1 IMPLANT
TRANSDUCER W/STOPCOCK (MISCELLANEOUS) ×1 IMPLANT
TRAY PV CATH (CUSTOM PROCEDURE TRAY) ×1 IMPLANT
TUBING CIL FLEX 10 FLL-RA (TUBING) ×1 IMPLANT
WIRE BENTSON .035X145CM (WIRE) IMPLANT

## 2023-10-23 NOTE — Interval H&P Note (Signed)
 History and Physical Interval Note:  10/23/2023 9:26 AM  Erin Davila  has presented today for surgery, with the diagnosis of low flow.  The various methods of treatment have been discussed with the patient and family. After consideration of risks, benefits and other options for treatment, the patient has consented to  Procedure(s): A/V Fistulagram (N/A) A/V SHUNT INTERVENTION (N/A) as a surgical intervention.  The patient's history has been reviewed, patient examined, no change in status, stable for surgery.  I have reviewed the patient's chart and labs.  Questions were answered to the patient's satisfaction.     Carlene Che

## 2023-10-23 NOTE — Op Note (Signed)
 DATE OF SERVICE: 10/23/2023  PATIENT:  Erin Davila  63 y.o. female  PRE-OPERATIVE DIAGNOSIS:  ESRD  POST-OPERATIVE DIAGNOSIS:  Same  PROCEDURE:   1) duplex ultrasound guided left arm arteriovenous fistula access 2) left arm fistulagram 3) left arm AVF angioplasty (7x40mm Mustang)  SURGEON:  Surgeons and Role:    * Carlene Che, MD - Primary  ASSISTANT: none  ANESTHESIA:   local  EBL: minimal  BLOOD ADMINISTERED:none  DRAINS: none   LOCAL MEDICATIONS USED:  LIDOCAINE    SPECIMEN:  none  COUNTS: confirmed correct.  TOURNIQUET:  none  PATIENT DISPOSITION:  PACU - hemodynamically stable.   Delay start of Pharmacological VTE agent (>24hrs) due to surgical blood loss or risk of bleeding: no  INDICATION FOR PROCEDURE: Erin Davila is a 63 y.o. female with ESRD on HD. She has a poorly maturing left brachiocephalic AVF which has been superficialized. After careful discussion of risks, benefits, and alternatives the patient was offered fistulagram. The patient understood and wished to proceed.  OPERATIVE FINDINGS:  No central venous stenosis No left subclavian vein stenosis  No cephalic arch stenosis AVF patent except for a focal area of narrowing - likely revision re-anastomosis ? Small blush near revision. Could not demonstrate pseudoaneurysm on duplex. Arm soft on exam. Good response to angioplasty.   DESCRIPTION OF PROCEDURE: After identification of the patient in the pre-operative holding area, the patient was transferred to the operating room. The patient was positioned supine on the operating room table. Anesthesia was induced. The left arm was prepped and draped in standard fashion. A surgical pause was performed confirming correct patient, procedure, and operative location.  Duplex ultrasound was used to guide access in the left arm fistula with micropuncture technique.  Seldinger technique was used to exchange for a microcatheter.  Fistulogram was  performed in stations.  See above for details.  An additional access was performed retrograde in the central arm to allow angioplasty of the proximal stenosis.  Good result was achieved with a 7 x 20 mm angioplasty balloon.  Improved thrill was noted on exam in the fistula.  All access was removed and pursestring sutures of Monocryl were applied with good hemostatic effect.  Monocryl were applied with good hemostatic effect.  Upon completion of the case instrument and sharps counts were confirmed correct. The patient was transferred to the PACU in good condition. I was present for all portions of the procedure.  FOLLOW UP PLAN: Okay to start using the fistula at dialysis.  Follow-up with vascular and vein specialist PA in 4 weeks with AV fistula duplex.  Heber Little. Edgardo Goodwill, MD [facility] Phone Number: [phone] 10/23/2023 9:26 AM

## 2023-10-25 ENCOUNTER — Encounter (HOSPITAL_COMMUNITY): Payer: Self-pay | Admitting: Vascular Surgery

## 2023-11-09 ENCOUNTER — Other Ambulatory Visit: Payer: Self-pay

## 2023-11-09 DIAGNOSIS — N186 End stage renal disease: Secondary | ICD-10-CM

## 2023-11-17 ENCOUNTER — Ambulatory Visit (HOSPITAL_COMMUNITY)
Admission: RE | Admit: 2023-11-17 | Discharge: 2023-11-17 | Disposition: A | Discharge status: Home / Self Care | Source: Ambulatory Visit | Attending: Vascular Surgery | Admitting: Vascular Surgery

## 2023-11-17 DIAGNOSIS — Z992 Dependence on renal dialysis: Secondary | ICD-10-CM | POA: Insufficient documentation

## 2023-11-17 DIAGNOSIS — N186 End stage renal disease: Secondary | ICD-10-CM | POA: Insufficient documentation

## 2023-11-24 ENCOUNTER — Ambulatory Visit: Attending: Vascular Surgery | Admitting: Physician Assistant

## 2023-11-24 VITALS — BP 137/80 | HR 81 | Temp 97.8°F | Resp 18 | Ht 68.0 in | Wt 383.0 lb

## 2023-11-24 DIAGNOSIS — N186 End stage renal disease: Secondary | ICD-10-CM | POA: Diagnosis not present

## 2023-11-24 NOTE — Progress Notes (Signed)
 Established Dialysis Access   History of Present Illness   Erin Davila is a 63 y.o. (Nov 17, 1960) female who presents for follow up of left AV fistula. She recently underwent Fistulogram on 10/23/23 with Dr. Edgardo Goodwill with AVF angioplasty. She explains that the fistula has been working well since her intervention. She did on Saturday at her last HD session have some infiltration but was able to complete her full dialysis session. No bleeding, swelling or pain. She will go to dialysis again today. She dialyzes on TTS. She still has a right internal jugular TDC that she is hopeful to get out soon. This was placed by IR in February  The patient's PMH, PSH, SH, and FamHx were reviewed and are unchanged from her last visit   Current Outpatient Medications  Medication Sig Dispense Refill   acetaminophen  (TYLENOL ) 500 MG tablet Take 500-1,000 mg by mouth every 6 (six) hours as needed for mild pain (pain score 1-3), moderate pain (pain score 4-6) or headache.     amLODipine  (NORVASC ) 10 MG tablet Take 10 mg by mouth at bedtime.     AURYXIA 1 GM 210 MG(Fe) tablet Take 210 mg by mouth 3 (three) times daily.     calcitRIOL  (ROCALTROL ) 0.5 MCG capsule Take 1 capsule (0.5 mcg total) by mouth daily. 30 capsule 0   carvedilol  (COREG ) 6.25 MG tablet Take 6.25 mg by mouth 2 (two) times daily with a meal.     hydrALAZINE  (APRESOLINE ) 50 MG tablet Take 50 mg by mouth in the morning and at bedtime.     HYDROcodone -acetaminophen  (NORCO/VICODIN) 5-325 MG tablet Take 1 tablet by mouth every 4 (four) hours as needed for moderate pain (pain score 4-6).     hyoscyamine  (LEVSIN) 0.125 MG/5ML ELIX Take 10 mLs (0.25 mg total) by mouth every 6 (six) hours as needed for cramping (abdominal pain/discomfort). 600 mL 0   insulin  glargine (LANTUS  SOLOSTAR) 100 UNIT/ML Solostar Pen Inject 38 Units into the skin at bedtime. 15 mL 0   montelukast  (SINGULAIR ) 10 MG tablet Take 1 tablet (10 mg total) by mouth at bedtime. 30 tablet  0   multivitamin (RENA-VIT) TABS tablet Take 1 tablet by mouth at bedtime. 30 tablet 0   OZEMPIC, 2 MG/DOSE, 8 MG/3ML SOPN Inject 2 mg into the skin every Monday.     pantoprazole  (PROTONIX ) 40 MG tablet Take 1 tablet (40 mg total) by mouth daily. (Patient taking differently: Take 40 mg by mouth daily after lunch.) 30 tablet 0   simvastatin  (ZOCOR ) 40 MG tablet Take 1 tablet (40 mg total) by mouth at bedtime. 30 tablet 0   torsemide  40 MG TABS Take 40 mg by mouth 2 (two) times daily. on non-dialysis days 60 tablet 0   No current facility-administered medications for this visit.    On ROS today: negative unless stated in HPI   Physical Examination   Vitals:   11/24/23 0954  BP: 137/80  Pulse: 81  Resp: 18  Temp: 97.8 F (36.6 C)  SpO2: 94%     General Not in any acute distress, very pleasant  Pulmonary Non labored  Cardiac regular  Vascular Left AV fistula with good thrill. Left arm is well perfused and warm with palpable radial pulse  Musculo- skeletal M/S 5/5 throughout  , Extremities without ischemic changes    Neurologic A&O; CN grossly intact     Non-invasive Vascular Imaging   left Arm Access Duplex  (11/17/23):  Findings:  +--------------------+----------+-----------------+--------+  AVF  PSV (cm/s)Flow Vol (mL/min)Comments  +--------------------+----------+-----------------+--------+  Native artery inflow   157           916                 +--------------------+----------+-----------------+--------+  AVF Anastomosis        436                               +--------------------+----------+-----------------+--------+    +------------+----------+-------------+----------+--------+  OUTFLOW VEINPSV (cm/s)Diameter (cm)Depth (cm)Describe  +------------+----------+-------------+----------+--------+  Prox UA        189        0.67        1.58             +------------+----------+-------------+----------+--------+  Mid UA          164        0.58        0.38             +------------+----------+-------------+----------+--------+  Dist UA        107        0.56        0.71             +------------+----------+-------------+----------+--------+  AC Fossa       349        1.11        0.11             +------------+----------+-------------+----------+--------+   Summary:  Patent arteriovenous fistula.     Medical Decision Making   Erin Davila is a 63 y.o. female who presents for follow up of left AV fistula. She recently underwent Fistulogram on 10/23/23 with Dr. Edgardo Goodwill with AVF angioplasty. She explains that the fistula has been working well since her intervention. On Duplex her fistula is patent with good volume flow, still a little small centrally but otherwise adequate appearing. Her TDC can be removed by IR at the discretion of the dialysis center when they feel comfortable with the function of her left AV fistula. She can follow up with us  as needed.    Deneen Finical, PA-C [facility] [place] Office: [phone]  Clinic MD: [place] Job

## 2024-01-06 ENCOUNTER — Other Ambulatory Visit: Payer: Self-pay

## 2024-01-06 ENCOUNTER — Encounter (HOSPITAL_COMMUNITY)
Admission: RE | Disposition: A | Discharge status: Home / Self Care | Payer: Self-pay | Source: Home / Self Care | Attending: Vascular Surgery

## 2024-01-06 ENCOUNTER — Ambulatory Visit (HOSPITAL_COMMUNITY)
Admission: RE | Admit: 2024-01-06 | Discharge: 2024-01-06 | Disposition: A | Discharge status: Home / Self Care | Attending: Vascular Surgery | Admitting: Vascular Surgery

## 2024-01-06 DIAGNOSIS — T82858A Stenosis of vascular prosthetic devices, implants and grafts, initial encounter: Secondary | ICD-10-CM

## 2024-01-06 DIAGNOSIS — N186 End stage renal disease: Secondary | ICD-10-CM | POA: Diagnosis not present

## 2024-01-06 DIAGNOSIS — Y832 Surgical operation with anastomosis, bypass or graft as the cause of abnormal reaction of the patient, or of later complication, without mention of misadventure at the time of the procedure: Secondary | ICD-10-CM | POA: Insufficient documentation

## 2024-01-06 DIAGNOSIS — E1122 Type 2 diabetes mellitus with diabetic chronic kidney disease: Secondary | ICD-10-CM | POA: Diagnosis not present

## 2024-01-06 DIAGNOSIS — I120 Hypertensive chronic kidney disease with stage 5 chronic kidney disease or end stage renal disease: Secondary | ICD-10-CM | POA: Insufficient documentation

## 2024-01-06 DIAGNOSIS — Z992 Dependence on renal dialysis: Secondary | ICD-10-CM | POA: Diagnosis not present

## 2024-01-06 DIAGNOSIS — D631 Anemia in chronic kidney disease: Secondary | ICD-10-CM

## 2024-01-06 HISTORY — PX: A/V SHUNT INTERVENTION: CATH118220

## 2024-01-06 LAB — GLUCOSE, CAPILLARY: Glucose-Capillary: 147 mg/dL — ABNORMAL HIGH (ref 70–99)

## 2024-01-06 SURGERY — A/V SHUNT INTERVENTION
Anesthesia: LOCAL | Site: Arm Upper | Laterality: Left

## 2024-01-06 MED ORDER — IODIXANOL 320 MG/ML IV SOLN
INTRAVENOUS | Status: DC | PRN
  Administered 2024-01-06: 115 mL

## 2024-01-06 MED ORDER — HEPARIN SODIUM (PORCINE) 1000 UNIT/ML IJ SOLN
INTRAMUSCULAR | Status: DC | PRN
  Administered 2024-01-06: 5000 [IU] via INTRAVENOUS

## 2024-01-06 MED ORDER — LIDOCAINE HCL (PF) 1 % IJ SOLN
INTRAMUSCULAR | Status: DC | PRN
Start: 2024-01-06 — End: 2024-01-06
  Administered 2024-01-06: 2 mL

## 2024-01-06 MED ORDER — HEPARIN SODIUM (PORCINE) 1000 UNIT/ML IJ SOLN
INTRAMUSCULAR | Status: AC
  Filled 2024-01-06: qty 10

## 2024-01-06 MED ORDER — LIDOCAINE HCL (PF) 1 % IJ SOLN
INTRAMUSCULAR | Status: AC
  Filled 2024-01-06: qty 30

## 2024-01-06 MED ORDER — HEPARIN (PORCINE) IN NACL 1000-0.9 UT/500ML-% IV SOLN
INTRAVENOUS | Status: DC | PRN
Start: 2024-01-06 — End: 2024-01-06
  Administered 2024-01-06 (×2): 500 mL

## 2024-01-06 SURGICAL SUPPLY — 20 items
BALLOON MUSTANG 4.0X40 75 (BALLOONS) IMPLANT
BALLOON MUSTANG 5.0X40 75 (BALLOONS) IMPLANT
BALLOON MUSTANG 8X60X75 (BALLOONS) IMPLANT
CATH BEACON 5 .035 65 KMP TIP (CATHETERS) IMPLANT
CATH NAVICROSS ANGLED 90CM (MICROCATHETER) IMPLANT
DEVICE TORQUE SEADRAGON GRN (MISCELLANEOUS) IMPLANT
GUIDEWIRE ANGLED .035 180CM (WIRE) IMPLANT
KIT ENCORE 26 ADVANTAGE (KITS) IMPLANT
KIT MICROPUNCTURE NIT STIFF (SHEATH) IMPLANT
KIT PV (KITS) ×1 IMPLANT
MAT PREVALON FULL STRYKER (MISCELLANEOUS) IMPLANT
SHEATH PINNACLE R/O II 6F 4CM (SHEATH) IMPLANT
SHEATH PROBE COVER 6X72 (BAG) IMPLANT
STENT VIABAHN 8X50X75 (Permanent Stent) IMPLANT
STOPCOCK MORSE 400PSI 3WAY (MISCELLANEOUS) IMPLANT
TRAY PV CATH (CUSTOM PROCEDURE TRAY) ×1 IMPLANT
TUBING CIL FLEX 10 FLL-RA (TUBING) IMPLANT
WIRE BENTSON .035X145CM (WIRE) IMPLANT
WIRE G V18X300CM (WIRE) IMPLANT
WIRE TORQFLEX AUST .018X40CM (WIRE) IMPLANT

## 2024-01-06 NOTE — H&P (Signed)
  HD ACCESS CENTER H&P   Patient ID: Erin Davila, female   DOB: 02-08-1961, 63 y.o.   MRN: [id number]  Subjective:     HPI Erin Davila is a 63 y.o. female with ESRD presenting to the HD access center for intervention.  Past Medical History:  Diagnosis Date   Allergy    Anemia of chronic kidney failure    stage 5   Diabetes mellitus without complication (HCC)    type 2   GERD (gastroesophageal reflux disease)    History of iron deficiency anemia    Hyperlipidemia    Hypertension    No family history on file. Past Surgical History:  Procedure Laterality Date   A/V FISTULAGRAM N/A 10/23/2023   Procedure: A/V Fistulagram;  Surgeon: Magda Debby SAILOR, MD;  Location: [facility] INVASIVE CV LAB;  Service: Cardiovascular;  Laterality: N/A;   A/V SHUNT INTERVENTION N/A 10/23/2023   Procedure: A/V SHUNT INTERVENTION;  Surgeon: Magda Debby SAILOR, MD;  Location: [facility] INVASIVE CV LAB;  Service: Cardiovascular;  Laterality: N/A;   AV FISTULA PLACEMENT Left 05/06/2023   Procedure: LEFT ARM RADIOCEPHALIC ARTERIOVENOUS (AV) FISTULA CREATION;  Surgeon: Magda Debby SAILOR, MD;  Location: [facility] OR;  Service: Vascular;  Laterality: Left;   CESAREAN SECTION     x 1   INCISION AND DRAINAGE ABSCESS N/A 01/04/2021   Procedure: INCISION AND DRAINAGE LEFT BUTTOCK ABSCESS;  Surgeon: Gladis Cough, MD;  Location: [facility] ORS;  Service: General;  Laterality: N/A;   IR FLUORO GUIDE CV LINE RIGHT  08/31/2023   IR US  GUIDE VASC ACCESS RIGHT  08/31/2023   REVISON OF ARTERIOVENOUS FISTULA Left 09/02/2023   Procedure: REVISON OF ARTERIOVENOUS FISTULA LEFT;  Surgeon: Magda Debby SAILOR, MD;  Location: [facility] OR;  Service: Vascular;  Laterality: Left;    Short Social History:  Social History   Tobacco Use   Smoking status: Never   Smokeless tobacco: Never  Substance Use Topics   Alcohol use: No    Allergies  Allergen Reactions   Lactose Other (See Comments)    GI complaints    No current facility-administered  medications for this encounter.    REVIEW OF SYSTEMS All other systems were reviewed and are negative     Objective:   Objective   Vitals:   01/06/24 0815  BP: (!) 160/68  Pulse: 85  Resp: 12  Temp: 98.1 F (36.7 C)  TempSrc: Oral  SpO2: 99%   There is no height or weight on file to calculate BMI.  Physical Exam General: no acute distress Cardiac: hemodynamically stable Extremities: palpable thrill in L arm AVF      Assessment/Plan:   Erin Davila is a 63 y.o. female with ESRD presenting for fistulagram.  Having issues with high pressures due sessions. Last HD session yesterday. Reviewed risks and benefits of fistulagram with intervention and patient agreed to proceed.   Norman Serve, MD [facility]

## 2024-01-06 NOTE — Op Note (Addendum)
 Patient name: Erin Davila MRN: [id number] DOB: 04-04-1961 Sex: female  01/06/2024 Pre-operative Diagnosis: ESRD on HD, having high venous pressures on sessions Post-operative diagnosis:  Same Surgeon:  Norman GORMAN Serve, MD Procedure Performed:  Ultrasound-guided access of left arm aVF Fistulogram and central venogram Stenting of cephalic arch, 8 mm x 5 cm Viabahn stent, postdilated with 8 mm Mustang Balloon angioplasty of axillary vein, 8 mm Mustang balloon Balloon angioplasty of arterial anastomosis, 4 mm and 5 mm Mustang balloons   Indications: [name] is a 63 year old female with ESRD on HD who presented to the HD access center for intervention today.  She has been having issues with high pressures on the venous limb during dialysis sessions, her last session was done yesterday.  Risk and benefits of fistulogram with intervention were reviewed, she expressed understanding and elected to proceed.  Findings:  Widely patent central venous system.  Severe approximate 80% stenosis of the cephalic arch, widely patent AV fistula, severe greater then 90% stenosis of the proximal AV fistula and anastomosis.   Procedure:  The patient was identified in the holding area and taken to the cath lab  The patient was then placed supine on the table and prepped and draped in the usual sterile fashion.  A time out was called.  Ultrasound was used to evaluate the left arm AV access. This was accessed under u/s guidance. An 018 wire was advanced without resistance, a micropuncture sheath was placed and fistulagram obtained which demonstrated the above findings.  This access was then upsized to a 33F short sheath over a glidewire.  The lesion was crossed with a Bentson wire I recently started with balloon angioplasty of the cephalic arch stenosis with an 8 mm x 40 mm Mustang balloon. This resulted in spasm and a small area of extravasation.  I formed a prolonged balloon inflation of the cephalic arch into  the axillary vein although this did not yield an adequate result and therefore I elected to stent this area with an 8 mm x 5 cm Viabahn stent.  After this was done the treated segment appeared widely patent although there was sluggish flow and contrast was now out flowing into the venous system.  She has history of the proximal lesion that was treated by Dr. Magda in February and therefore the antegrade sheath was removed and a 4 oh figure-of-eight suture was placed.  The fistula was then accessed retrograde towards the arterial anastomosis.  Over the microwire a micropuncture sheath was placed and this was upsized to a 6 [place] sheath over a Glidewire.  The proximal lesion was so severe that an 018 wire was needed to cross the lesion and was placed into the proximal brachial artery.  Using a Nathalie cross catheter the 018 wire was exchanged for an 035 Bentson in the arterial anastomosis and proximal fistula lesion was treated with a 4 mm balloon and then a 5 mm x 40 mm Mustang balloon.  At this point there was flow through the fistula with brisk outflow.  There was some element of clot and an aneurysmal segment in the proximal anastomosis although given the improvement in flow and palpable thrill I elected to conclude the case at that time.  The wire and sheath were removed and the access was managed with a 4-0 Monocryl figure-of-eight suture for hemostasis  Contrast: 115 cc   Impression: Stenting of the cephalic arch and balloon angioplasty of a severe stenosis at the anastomosis and proximal fistula.  Adequate flow and brisk outflow on completion although the long-term durability is likely to be marginal   Norman GORMAN Serve MD [facility] [place] Office: [phone]

## 2024-01-07 ENCOUNTER — Encounter (HOSPITAL_COMMUNITY): Payer: Self-pay | Admitting: Vascular Surgery

## 2024-02-11 ENCOUNTER — Encounter (HOSPITAL_COMMUNITY): Payer: Self-pay

## 2024-02-19 ENCOUNTER — Telehealth: Payer: Self-pay

## 2024-02-19 NOTE — Telephone Encounter (Signed)
 Attempted to return pt's call regarding her question about medical leave documents submitted earlier this year on her behalf. She did not answer and no vm is set up.

## 2024-04-12 ENCOUNTER — Encounter (HOSPITAL_COMMUNITY): Payer: Self-pay

## 2024-04-18 ENCOUNTER — Other Ambulatory Visit: Payer: Self-pay

## 2024-04-18 ENCOUNTER — Encounter (HOSPITAL_COMMUNITY)
Admission: RE | Disposition: A | Discharge status: Home / Self Care | Payer: Self-pay | Source: Home / Self Care | Attending: Vascular Surgery

## 2024-04-18 ENCOUNTER — Ambulatory Visit (HOSPITAL_COMMUNITY)
Admission: RE | Admit: 2024-04-18 | Discharge: 2024-04-18 | Disposition: A | Discharge status: Home / Self Care | Attending: Vascular Surgery | Admitting: Vascular Surgery

## 2024-04-18 DIAGNOSIS — E1122 Type 2 diabetes mellitus with diabetic chronic kidney disease: Secondary | ICD-10-CM | POA: Diagnosis not present

## 2024-04-18 DIAGNOSIS — N186 End stage renal disease: Secondary | ICD-10-CM | POA: Insufficient documentation

## 2024-04-18 DIAGNOSIS — Z992 Dependence on renal dialysis: Secondary | ICD-10-CM | POA: Diagnosis not present

## 2024-04-18 DIAGNOSIS — I120 Hypertensive chronic kidney disease with stage 5 chronic kidney disease or end stage renal disease: Secondary | ICD-10-CM | POA: Insufficient documentation

## 2024-04-18 DIAGNOSIS — Y832 Surgical operation with anastomosis, bypass or graft as the cause of abnormal reaction of the patient, or of later complication, without mention of misadventure at the time of the procedure: Secondary | ICD-10-CM | POA: Insufficient documentation

## 2024-04-18 DIAGNOSIS — T82858A Stenosis of vascular prosthetic devices, implants and grafts, initial encounter: Secondary | ICD-10-CM | POA: Diagnosis present

## 2024-04-18 HISTORY — PX: VENOUS ANGIOPLASTY: CATH118376

## 2024-04-18 HISTORY — PX: A/V FISTULAGRAM: CATH118298

## 2024-04-18 LAB — GLUCOSE, CAPILLARY: Glucose-Capillary: 127 mg/dL — ABNORMAL HIGH (ref 70–99)

## 2024-04-18 SURGERY — A/V FISTULAGRAM
Anesthesia: LOCAL | Site: Arm Upper | Laterality: Left

## 2024-04-18 MED ORDER — LIDOCAINE HCL (PF) 1 % IJ SOLN
INTRAMUSCULAR | Status: AC
  Filled 2024-04-18: qty 30

## 2024-04-18 MED ORDER — LIDOCAINE HCL (PF) 1 % IJ SOLN
INTRAMUSCULAR | Status: DC | PRN
  Administered 2024-04-18: 5 mL via INTRADERMAL

## 2024-04-18 MED ORDER — HEPARIN (PORCINE) IN NACL 1000-0.9 UT/500ML-% IV SOLN
INTRAVENOUS | Status: DC | PRN
  Administered 2024-04-18: 500 mL

## 2024-04-18 MED ORDER — IODIXANOL 320 MG/ML IV SOLN
INTRAVENOUS | Status: DC | PRN
Start: 2024-04-18 — End: 2024-04-18
  Administered 2024-04-18: 50 mL

## 2024-04-18 SURGICAL SUPPLY — 10 items
BALLOON MUSTANG 6.0X40 75 (BALLOONS) IMPLANT
BALLOON MUSTANG 8.0X40 75 (BALLOONS) IMPLANT
GUIDEWIRE ANGLED .035X150CM (WIRE) IMPLANT
KIT MICROPUNCTURE NIT STIFF (SHEATH) IMPLANT
KIT PV (KITS) ×2 IMPLANT
MAT PREVALON FULL STRYKER (MISCELLANEOUS) IMPLANT
SHEATH PINNACLE R/O II 6F 4CM (SHEATH) IMPLANT
SHEATH PROBE COVER 6X72 (BAG) IMPLANT
TRAY PV CATH (CUSTOM PROCEDURE TRAY) ×2 IMPLANT
TUBING CIL FLEX 10 FLL-RA (TUBING) IMPLANT

## 2024-04-18 NOTE — H&P (Signed)
 [facility] [place]  ASSESSMENT / PLAN: 63 y.o. female with ESRD on HD.  Her dialysis center has reported poor performance of her fistula and has requested a fistulogram.  Plan same today in Cath Lab.  CHIEF COMPLAINT: End-stage renal disease  HISTORY OF PRESENT ILLNESS: Erin Davila is a 63 y.o. female with end-stage renal disease dialyzing through a left arm brachiocephalic AV fistula which has undergone revision.  Her dialysis center is reporting poor performance of the fistula.  Past Medical History:  Diagnosis Date   Allergy    Anemia of chronic kidney failure    stage 5   Diabetes mellitus without complication (HCC)    type 2   GERD (gastroesophageal reflux disease)    History of iron deficiency anemia    Hyperlipidemia    Hypertension     Past Surgical History:  Procedure Laterality Date   A/V FISTULAGRAM N/A 10/23/2023   Procedure: A/V Fistulagram;  Surgeon: Erin Debby SAILOR, MD;  Location: [facility] INVASIVE CV LAB;  Service: Cardiovascular;  Laterality: N/A;   A/V SHUNT INTERVENTION N/A 10/23/2023   Procedure: A/V SHUNT INTERVENTION;  Surgeon: Erin Debby SAILOR, MD;  Location: [facility] INVASIVE CV LAB;  Service: Cardiovascular;  Laterality: N/A;   A/V SHUNT INTERVENTION Left 01/06/2024   Procedure: A/V SHUNT INTERVENTION;  Surgeon: Erin Norman RAMAN, MD;  Location: [facility];  Service: Cardiovascular;  Laterality: Left;   AV FISTULA PLACEMENT Left 05/06/2023   Procedure: LEFT ARM RADIOCEPHALIC ARTERIOVENOUS (AV) FISTULA CREATION;  Surgeon: Erin Debby SAILOR, MD;  Location: [facility] OR;  Service: Vascular;  Laterality: Left;   CESAREAN SECTION     x 1   INCISION AND DRAINAGE ABSCESS N/A 01/04/2021   Procedure: INCISION AND DRAINAGE LEFT BUTTOCK ABSCESS;  Surgeon: Erin Cough, MD;  Location: [facility] ORS;  Service: General;  Laterality: N/A;   IR FLUORO GUIDE CV LINE RIGHT  08/31/2023   IR US  GUIDE VASC ACCESS RIGHT  08/31/2023   REVISON OF ARTERIOVENOUS FISTULA Left  09/02/2023   Procedure: REVISON OF ARTERIOVENOUS FISTULA LEFT;  Surgeon: Erin Debby SAILOR, MD;  Location: [facility] OR;  Service: Vascular;  Laterality: Left;    No family history on file.  Social History   Socioeconomic History   Marital status: Single    Spouse name: Not on file   Number of children: Not on file   Years of education: Not on file   Highest education level: Not on file  Occupational History   Not on file  Tobacco Use   Smoking status: Never   Smokeless tobacco: Never  Vaping Use   Vaping status: Never Used  Substance and Sexual Activity   Alcohol use: No   Drug use: No   Sexual activity: Not Currently    Birth control/protection: Post-menopausal  Other Topics Concern   Not on file  Social History Narrative   Not on file   Social Drivers of Health   Financial Resource Strain: Not on file  Food Insecurity: No Food Insecurity (08/26/2023)   Hunger Vital Sign    Worried About Running Out of Food in the Last Year: Never true    Ran Out of Food in the Last Year: Never true  Transportation Needs: No Transportation Needs (08/26/2023)   PRAPARE - Administrator, Civil Service (Medical): No    Lack of Transportation (Non-Medical): No  Physical Activity: Not on file  Stress: Not on file  Social Connections: Not on file  Intimate  Partner Violence: Not At Risk (08/26/2023)   Humiliation, Afraid, Rape, and Kick questionnaire    Fear of Current or Ex-Partner: No    Emotionally Abused: No    Physically Abused: No    Sexually Abused: No    Allergies  Allergen Reactions   Lactose Other (See Comments)    GI complaints    No current facility-administered medications for this encounter.    PHYSICAL EXAM Vitals:   04/18/24 0821 04/18/24 0835  BP: (!) 166/68 (!) 160/80  Pulse: 85 84  Resp: 12   Temp: 98.3 F (36.8 C)   TempSrc: Oral   SpO2: 100% 100%   Well-appearing woman in no distress Regular rate and rhythm Unlabored breathing Left arm AV  fistula with strong thrill  PERTINENT LABORATORY AND RADIOLOGIC DATA  Most recent CBC    Latest Ref Rng & Units 10/23/2023    7:34 AM 09/03/2023    3:02 AM 09/01/2023    8:40 AM  CBC  WBC 4.0 - 10.5 K/uL  6.0  6.3   Hemoglobin 12.0 - 15.0 g/dL 87.0  9.7  9.1   Hematocrit 36.0 - 46.0 % 38.0  31.7  29.2   Platelets 150 - 400 K/uL  234  263      Most recent CMP    Latest Ref Rng & Units 10/23/2023    7:34 AM 09/04/2023    2:48 AM 09/03/2023    3:02 AM  CMP  Glucose 70 - 99 mg/dL 855  78  889   BUN 8 - 23 mg/dL 23  42  30   Creatinine 0.44 - 1.00 mg/dL 5.29  3.28  5.19   Sodium 135 - 145 mmol/L 138  134  135   Potassium 3.5 - 5.1 mmol/L 3.6  3.8  3.6   Chloride 98 - 111 mmol/L 96  97  96   CO2 22 - 32 mmol/L  25  23   Calcium  8.9 - 10.3 mg/dL  8.3  8.5     Renal function CrCl cannot be calculated (Patient's most recent lab result is older than the maximum 21 days allowed.).  Hgb A1c MFr Bld (%)  Date Value  08/29/2023 4.6 (L)    No results found for: LDLCALC, LDLC, HIRISKLDL, POCLDL, LDLDIRECT, REALLDLC, TOTLDLC   Erin Davila N. Magda, MD FACS [facility] Phone Number: [phone] 04/18/2024 9:03 AM   Total time spent on preparing this encounter including chart review, data review, collecting history, examining the patient, and coordinating care: 30 min  Portions of this report may have been transcribed using voice recognition software.  Every effort has been made to ensure accuracy; however, inadvertent computerized transcription errors may still be present.

## 2024-04-18 NOTE — Op Note (Signed)
 DATE OF SERVICE: 04/18/2024  PATIENT:  Erin Davila  63 y.o. female  PRE-OPERATIVE DIAGNOSIS:  end-stage renal disease  POST-OPERATIVE DIAGNOSIS:  Same  PROCEDURE:   1) Ultrasound guided left arm AVF access (CPT [phone]) 2) Left arm fistulagram with peripheral angioplasty 8x58mm Mustang (CPT [phone]) 3) established outpatient evaluation and management - level 3 (CPT 99213)  SURGEON:  Debby SAILOR. Magda, MD  ASSISTANT: none  ANESTHESIA:   local  ESTIMATED BLOOD LOSS: min  LOCAL MEDICATIONS USED:  LIDOCAINE    COUNTS: confirmed correct.  PATIENT DISPOSITION:  PACU - hemodynamically stable.   Delay start of Pharmacological VTE agent (>24hrs) due to surgical blood loss or risk of bleeding: no  INDICATION FOR PROCEDURE: Erin Davila is a 63 y.o. female with ESRD on HD via left arm AVF. After careful discussion of risks, benefits, and alternatives the patient was offered fistulagram.  The patient understood and wished to proceed.  OPERATIVE FINDINGS:  Left Upper Extremity Central venous: no stenosis Subclavian vein: no stenosis Cephalic arch: no stenosis Fistula: focal stenosis in peripheral fistula >90%. Anastomosis: stenosis in fistula just past anastomosis ~80%. No brachial artery stenosis.  DESCRIPTION OF PROCEDURE: After identification of the patient in the pre-operative holding area, the patient was transferred to the operating room. The patient was positioned supine on the operating room table.  The left upper extremity was prepped and draped in standard fashion. A surgical pause was performed confirming correct patient, procedure, and operative location.  The left upper extremity was anesthetized with subcutaneous injection of 1% lidocaine  over the area of planned access. Using ultrasound guidance, the left arm dialysis access was accessed with micropuncture technique.  Fistulogram was performed in stations with the micro sheath.  See above for details.  The decision was  made to intervene. The lesions were crossed with a glidewire. A 15F sheath was introduced into the fistula. Angioplasty was performed of the two lesions with good result and minimal residual.  All endovascular equipment was removed.  A figure-of-eight stitch was applied to the exit site with good hemostasis.  Sterile bandage was applied.  Upon completion of the case instrument and sharps counts were confirmed correct. The patient was transferred to the PACU in good condition. I was present for all portions of the procedure.  PLAN: good result from fistula intervention. Fistula remains amenable to percutaneous intervention.  Debby SAILOR. Magda, MD [facility] Phone Number: [phone] 04/18/2024 9:36 AM

## 2024-04-19 ENCOUNTER — Encounter (HOSPITAL_COMMUNITY): Payer: Self-pay | Admitting: Vascular Surgery

## 2024-06-20 ENCOUNTER — Other Ambulatory Visit: Payer: Self-pay

## 2024-06-20 ENCOUNTER — Ambulatory Visit (HOSPITAL_COMMUNITY)
Admission: RE | Admit: 2024-06-20 | Discharge: 2024-06-20 | Disposition: A | Attending: Vascular Surgery | Admitting: Vascular Surgery

## 2024-06-20 ENCOUNTER — Encounter (HOSPITAL_COMMUNITY): Admission: RE | Disposition: A | Payer: Self-pay | Attending: Vascular Surgery

## 2024-06-20 DIAGNOSIS — Y832 Surgical operation with anastomosis, bypass or graft as the cause of abnormal reaction of the patient, or of later complication, without mention of misadventure at the time of the procedure: Secondary | ICD-10-CM | POA: Diagnosis not present

## 2024-06-20 DIAGNOSIS — Z992 Dependence on renal dialysis: Secondary | ICD-10-CM | POA: Insufficient documentation

## 2024-06-20 DIAGNOSIS — I120 Hypertensive chronic kidney disease with stage 5 chronic kidney disease or end stage renal disease: Secondary | ICD-10-CM | POA: Insufficient documentation

## 2024-06-20 DIAGNOSIS — T82858A Stenosis of vascular prosthetic devices, implants and grafts, initial encounter: Secondary | ICD-10-CM | POA: Insufficient documentation

## 2024-06-20 DIAGNOSIS — N186 End stage renal disease: Secondary | ICD-10-CM | POA: Insufficient documentation

## 2024-06-20 DIAGNOSIS — E1122 Type 2 diabetes mellitus with diabetic chronic kidney disease: Secondary | ICD-10-CM | POA: Diagnosis not present

## 2024-06-20 HISTORY — PX: A/V FISTULAGRAM: CATH118298

## 2024-06-20 LAB — GLUCOSE, CAPILLARY: Glucose-Capillary: 141 mg/dL — ABNORMAL HIGH (ref 70–99)

## 2024-06-20 SURGERY — A/V FISTULAGRAM
Anesthesia: LOCAL | Site: Arm Upper | Laterality: Left

## 2024-06-20 MED ORDER — LIDOCAINE HCL (PF) 1 % IJ SOLN
INTRAMUSCULAR | Status: AC
  Filled 2024-06-20: qty 30

## 2024-06-20 MED ORDER — IODIXANOL 320 MG/ML IV SOLN
INTRAVENOUS | Status: DC | PRN
  Administered 2024-06-20: 08:00:00 30 mL via INTRAVENOUS

## 2024-06-20 MED ORDER — HEPARIN (PORCINE) IN NACL 1000-0.9 UT/500ML-% IV SOLN
INTRAVENOUS | Status: DC | PRN
  Administered 2024-06-20: 08:00:00 500 mL

## 2024-06-20 MED ORDER — LIDOCAINE HCL (PF) 1 % IJ SOLN
INTRAMUSCULAR | Status: DC | PRN
  Administered 2024-06-20: 08:00:00 2 mL via SUBCUTANEOUS

## 2024-06-20 SURGICAL SUPPLY — 7 items
KIT MICROPUNCTURE NIT STIFF (SHEATH) IMPLANT
KIT PV (KITS) ×1 IMPLANT
MAT PREVALON FULL STRYKER (MISCELLANEOUS) IMPLANT
SHEATH PROBE COVER 6X72 (BAG) IMPLANT
STOPCOCK MORSE 400PSI 3WAY (MISCELLANEOUS) IMPLANT
TRAY PV CATH (CUSTOM PROCEDURE TRAY) ×1 IMPLANT
TUBING CIL FLEX 10 FLL-RA (TUBING) IMPLANT

## 2024-06-20 NOTE — H&P (View-Only) (Signed)
 [facility] [place]  ASSESSMENT / PLAN: 63 y.o. female with ESRD on HD via LUE BC AVF. The fistula is not performing well at dialysis. I recommended fistulagram to evaluate and manage. Patient agreeable.  CHIEF COMPLAINT: ESRD  HISTORY OF PRESENT ILLNESS: Erin Davila is a 63 y.o. female who I know well with ESRD on HD. She is dialyzing through a left arm brachiocephalic AVF. The fistula is not performing well at dialysis. She was recently intervened upon in October 2025. We reviewed options including repeat fistulagram.  Past Medical History:  Diagnosis Date   Allergy    Anemia of chronic kidney failure    stage 5   Diabetes mellitus without complication (HCC)    type 2   GERD (gastroesophageal reflux disease)    History of iron deficiency anemia    Hyperlipidemia    Hypertension     Past Surgical History:  Procedure Laterality Date   A/V FISTULAGRAM N/A 10/23/2023   Procedure: A/V Fistulagram;  Surgeon: Magda Debby SAILOR, MD;  [facility];  Service: Cardiovascular;  Laterality: N/A;   A/V FISTULAGRAM Left 04/18/2024   Procedure: A/V Fistulagram;  Surgeon: Magda Debby SAILOR, MD;  Location: [facility];  Service: Cardiovascular;  Laterality: Left;   A/V SHUNT INTERVENTION N/A 10/23/2023   Procedure: A/V SHUNT INTERVENTION;  Surgeon: Magda Debby SAILOR, MD;  [facility];  Service: Cardiovascular;  Laterality: N/A;   A/V SHUNT INTERVENTION Left 01/06/2024   Procedure: A/V SHUNT INTERVENTION;  Surgeon: Pearline Norman RAMAN, MD;  Location: [facility];  Service: Cardiovascular;  Laterality: Left;   AV FISTULA PLACEMENT Left 05/06/2023   Procedure: LEFT ARM RADIOCEPHALIC ARTERIOVENOUS (AV) FISTULA CREATION;  Surgeon: Magda Debby SAILOR, MD;  Location: [facility];  Service: Vascular;  Laterality: Left;   CESAREAN SECTION     x 1   INCISION AND DRAINAGE ABSCESS N/A 01/04/2021   Procedure: INCISION AND DRAINAGE LEFT BUTTOCK ABSCESS;  Surgeon:  Gladis Cough, MD;  Location: [facility];  Service: General;  Laterality: N/A;   IR FLUORO GUIDE CV LINE RIGHT  08/31/2023   IR US  GUIDE VASC ACCESS RIGHT  08/31/2023   REVISON OF ARTERIOVENOUS FISTULA Left 09/02/2023   Procedure: REVISON OF ARTERIOVENOUS FISTULA LEFT;  Surgeon: Magda Debby SAILOR, MD;  Location: [facility] OR;  Service: Vascular;  Laterality: Left;   VENOUS ANGIOPLASTY Left 04/18/2024   Procedure: VENOUS ANGIOPLASTY;  Surgeon: Magda Debby SAILOR, MD;  Location: [facility];  Service: Cardiovascular;  Laterality: Left;  Inflow Cephalic Vein; Cephalic Arch    No family history on file.  Social History   Socioeconomic History   Marital status: Single    Spouse name: Not on file   Number of children: Not on file   Years of education: Not on file   Highest education level: Not on file  Occupational History   Not on file  Tobacco Use   Smoking status: Never   Smokeless tobacco: Never  Vaping Use   Vaping status: Never Used  Substance and Sexual Activity   Alcohol use: No   Drug use: No   Sexual activity: Not Currently    Birth control/protection: Post-menopausal  Other Topics Concern   Not on file  Social History Narrative   Not on file   Social Drivers of Health   Tobacco Use: Low Risk (11/24/2023)   Patient History    Smoking Tobacco Use: Never    Smokeless Tobacco Use: Never  Passive Exposure: Not on file  Financial Resource Strain: Not on file  Food Insecurity: No Food Insecurity (08/26/2023)   Hunger Vital Sign    Worried About Running Out of Food in the Last Year: Never true    Ran Out of Food in the Last Year: Never true  Transportation Needs: No Transportation Needs (08/26/2023)   PRAPARE - Administrator, Civil Service (Medical): No    Lack of Transportation (Non-Medical): No  Physical Activity: Not on file  Stress: Not on file  Social Connections: Not on file  Intimate Partner Violence: Not At Risk (08/26/2023)   Humiliation, Afraid, Rape, and Kick  questionnaire    Fear of Current or Ex-Partner: No    Emotionally Abused: No    Physically Abused: No    Sexually Abused: No  Depression (PHQ2-9): Not on file  Alcohol Screen: Not on file  Housing: Low Risk (08/26/2023)   Housing Stability Vital Sign    Unable to Pay for Housing in the Last Year: No    Number of Times Moved in the Last Year: 0    Homeless in the Last Year: No  Utilities: Not At Risk (08/26/2023)   AHC Utilities    Threatened with loss of utilities: No  Health Literacy: Not on file    Allergies[1]  No current facility-administered medications for this encounter.    PHYSICAL EXAM Vitals:   06/20/24 0717 06/20/24 0740  BP: (!) 143/69 (!) 146/63  Pulse: 84 83  Resp: 12 16  Temp: 97.9 F (36.6 C)   TempSrc: Oral   SpO2: 99% 98%   No acute distress LUE BC AVF with weak thrill  PERTINENT LABORATORY AND RADIOLOGIC DATA  Most recent CBC    Latest Ref Rng & Units 10/23/2023    7:34 AM 09/03/2023    3:02 AM 09/01/2023    8:40 AM  CBC  WBC 4.0 - 10.5 K/uL  6.0  6.3   Hemoglobin 12.0 - 15.0 g/dL 87.0  9.7  9.1   Hematocrit 36.0 - 46.0 % 38.0  31.7  29.2   Platelets 150 - 400 K/uL  234  263      Most recent CMP    Latest Ref Rng & Units 10/23/2023    7:34 AM 09/04/2023    2:48 AM 09/03/2023    3:02 AM  CMP  Glucose 70 - 99 mg/dL 855  78  889   BUN 8 - 23 mg/dL 23  42  30   Creatinine 0.44 - 1.00 mg/dL 5.29  3.28  5.19   Sodium 135 - 145 mmol/L 138  134  135   Potassium 3.5 - 5.1 mmol/L 3.6  3.8  3.6   Chloride 98 - 111 mmol/L 96  97  96   CO2 22 - 32 mmol/L  25  23   Calcium  8.9 - 10.3 mg/dL  8.3  8.5     Renal function CrCl cannot be calculated (Patient's most recent lab result is older than the maximum 21 days allowed.).  Hgb A1c MFr Bld (%)  Date Value  08/29/2023 4.6 (L)     Debby SAILOR. Magda, MD FACS [facility] Phone Number: [phone] 06/20/2024 7:54 AM   Total time spent on preparing this  encounter including chart review, data review, collecting history, examining the patient, and coordinating care: 30 min  Portions of this report may have been transcribed using voice recognition software.  Every effort has  been made to ensure accuracy; however, inadvertent computerized transcription errors may still be present.       [1]  Allergies Allergen Reactions   Lactose Other (See Comments)    GI complaints

## 2024-06-20 NOTE — H&P (Signed)
 [facility] [place]  ASSESSMENT / PLAN: 63 y.o. female with ESRD on HD via LUE BC AVF. The fistula is not performing well at dialysis. I recommended fistulagram to evaluate and manage. Patient agreeable.  CHIEF COMPLAINT: ESRD  HISTORY OF PRESENT ILLNESS: Erin Davila is a 63 y.o. female who I know well with ESRD on HD. She is dialyzing through a left arm brachiocephalic AVF. The fistula is not performing well at dialysis. She was recently intervened upon in October 2025. We reviewed options including repeat fistulagram.  Past Medical History:  Diagnosis Date   Allergy    Anemia of chronic kidney failure    stage 5   Diabetes mellitus without complication (HCC)    type 2   GERD (gastroesophageal reflux disease)    History of iron deficiency anemia    Hyperlipidemia    Hypertension     Past Surgical History:  Procedure Laterality Date   A/V FISTULAGRAM N/A 10/23/2023   Procedure: A/V Fistulagram;  Surgeon: Magda Debby SAILOR, MD;  [facility];  Service: Cardiovascular;  Laterality: N/A;   A/V FISTULAGRAM Left 04/18/2024   Procedure: A/V Fistulagram;  Surgeon: Magda Debby SAILOR, MD;  Location: [facility];  Service: Cardiovascular;  Laterality: Left;   A/V SHUNT INTERVENTION N/A 10/23/2023   Procedure: A/V SHUNT INTERVENTION;  Surgeon: Magda Debby SAILOR, MD;  [facility];  Service: Cardiovascular;  Laterality: N/A;   A/V SHUNT INTERVENTION Left 01/06/2024   Procedure: A/V SHUNT INTERVENTION;  Surgeon: Pearline Norman RAMAN, MD;  Location: [facility];  Service: Cardiovascular;  Laterality: Left;   AV FISTULA PLACEMENT Left 05/06/2023   Procedure: LEFT ARM RADIOCEPHALIC ARTERIOVENOUS (AV) FISTULA CREATION;  Surgeon: Magda Debby SAILOR, MD;  Location: [facility];  Service: Vascular;  Laterality: Left;   CESAREAN SECTION     x 1   INCISION AND DRAINAGE ABSCESS N/A 01/04/2021   Procedure: INCISION AND DRAINAGE LEFT BUTTOCK ABSCESS;  Surgeon:  Gladis Cough, MD;  Location: [facility];  Service: General;  Laterality: N/A;   IR FLUORO GUIDE CV LINE RIGHT  08/31/2023   IR US  GUIDE VASC ACCESS RIGHT  08/31/2023   REVISON OF ARTERIOVENOUS FISTULA Left 09/02/2023   Procedure: REVISON OF ARTERIOVENOUS FISTULA LEFT;  Surgeon: Magda Debby SAILOR, MD;  Location: [facility] OR;  Service: Vascular;  Laterality: Left;   VENOUS ANGIOPLASTY Left 04/18/2024   Procedure: VENOUS ANGIOPLASTY;  Surgeon: Magda Debby SAILOR, MD;  Location: [facility];  Service: Cardiovascular;  Laterality: Left;  Inflow Cephalic Vein; Cephalic Arch    No family history on file.  Social History   Socioeconomic History   Marital status: Single    Spouse name: Not on file   Number of children: Not on file   Years of education: Not on file   Highest education level: Not on file  Occupational History   Not on file  Tobacco Use   Smoking status: Never   Smokeless tobacco: Never  Vaping Use   Vaping status: Never Used  Substance and Sexual Activity   Alcohol use: No   Drug use: No   Sexual activity: Not Currently    Birth control/protection: Post-menopausal  Other Topics Concern   Not on file  Social History Narrative   Not on file   Social Drivers of Health   Tobacco Use: Low Risk (11/24/2023)   Patient History    Smoking Tobacco Use: Never    Smokeless Tobacco Use: Never  Passive Exposure: Not on file  Financial Resource Strain: Not on file  Food Insecurity: No Food Insecurity (08/26/2023)   Hunger Vital Sign    Worried About Running Out of Food in the Last Year: Never true    Ran Out of Food in the Last Year: Never true  Transportation Needs: No Transportation Needs (08/26/2023)   PRAPARE - Administrator, Civil Service (Medical): No    Lack of Transportation (Non-Medical): No  Physical Activity: Not on file  Stress: Not on file  Social Connections: Not on file  Intimate Partner Violence: Not At Risk (08/26/2023)   Humiliation, Afraid, Rape, and Kick  questionnaire    Fear of Current or Ex-Partner: No    Emotionally Abused: No    Physically Abused: No    Sexually Abused: No  Depression (PHQ2-9): Not on file  Alcohol Screen: Not on file  Housing: Low Risk (08/26/2023)   Housing Stability Vital Sign    Unable to Pay for Housing in the Last Year: No    Number of Times Moved in the Last Year: 0    Homeless in the Last Year: No  Utilities: Not At Risk (08/26/2023)   AHC Utilities    Threatened with loss of utilities: No  Health Literacy: Not on file    Allergies[1]  No current facility-administered medications for this encounter.    PHYSICAL EXAM Vitals:   06/20/24 0717 06/20/24 0740  BP: (!) 143/69 (!) 146/63  Pulse: 84 83  Resp: 12 16  Temp: 97.9 F (36.6 C)   TempSrc: Oral   SpO2: 99% 98%   No acute distress LUE BC AVF with weak thrill  PERTINENT LABORATORY AND RADIOLOGIC DATA  Most recent CBC    Latest Ref Rng & Units 10/23/2023    7:34 AM 09/03/2023    3:02 AM 09/01/2023    8:40 AM  CBC  WBC 4.0 - 10.5 K/uL  6.0  6.3   Hemoglobin 12.0 - 15.0 g/dL 87.0  9.7  9.1   Hematocrit 36.0 - 46.0 % 38.0  31.7  29.2   Platelets 150 - 400 K/uL  234  263      Most recent CMP    Latest Ref Rng & Units 10/23/2023    7:34 AM 09/04/2023    2:48 AM 09/03/2023    3:02 AM  CMP  Glucose 70 - 99 mg/dL 855  78  889   BUN 8 - 23 mg/dL 23  42  30   Creatinine 0.44 - 1.00 mg/dL 5.29  3.28  5.19   Sodium 135 - 145 mmol/L 138  134  135   Potassium 3.5 - 5.1 mmol/L 3.6  3.8  3.6   Chloride 98 - 111 mmol/L 96  97  96   CO2 22 - 32 mmol/L  25  23   Calcium  8.9 - 10.3 mg/dL  8.3  8.5     Renal function CrCl cannot be calculated (Patient's most recent lab result is older than the maximum 21 days allowed.).  Hgb A1c MFr Bld (%)  Date Value  08/29/2023 4.6 (L)     Debby SAILOR. Magda, MD FACS [facility] Phone Number: [phone] 06/20/2024 7:54 AM   Total time spent on preparing this  encounter including chart review, data review, collecting history, examining the patient, and coordinating care: 30 min  Portions of this report may have been transcribed using voice recognition software.  Every effort has  been made to ensure accuracy; however, inadvertent computerized transcription errors may still be present.       [1]  Allergies Allergen Reactions   Lactose Other (See Comments)    GI complaints

## 2024-06-20 NOTE — Op Note (Signed)
 DATE OF SERVICE: 06/20/2024  PATIENT:  Erin Davila  63 y.o. female  PRE-OPERATIVE DIAGNOSIS:  end-stage renal disease  POST-OPERATIVE DIAGNOSIS:  Same  PROCEDURE:   1) Ultrasound guided left arm AVF access (CPT [id number]) 2) Left upper extremity fistulagram (CPT [phone]) 3) established outpatient evaluation and management - level 3 (CPT 99213)  SURGEON:  Debby SAILOR. Magda, MD  ASSISTANT: none  ANESTHESIA:   local  ESTIMATED BLOOD LOSS: min  LOCAL MEDICATIONS USED:  LIDOCAINE    COUNTS: confirmed correct.  PATIENT DISPOSITION:  PACU - hemodynamically stable.   Delay start of Pharmacological VTE agent (>24hrs) due to surgical blood loss or risk of bleeding: no  INDICATION FOR PROCEDURE: Brucha G Stachnik is a 63 y.o. female with ESRD on HD via LUE BC AVF. The fistula is not performing well. After careful discussion of risks, benefits, and alternatives the patient was offered fistulagram. The patient understood and wished to proceed.  OPERATIVE FINDINGS:  Left Upper Extremity Central venous: no stenosis Subclavian vein: no stenosis Cephalic arch: prior stenting patent with 10-20% ISR.  Fistula: no stenosis Anastomosis: 90% stenosis from anastomosis to 3 cm distal on fistula.   DESCRIPTION OF PROCEDURE: After identification of the patient in the pre-operative holding area, the patient was transferred to the operating room. The patient was positioned supine on the operating room table.  The left upper extremity was prepped and draped in standard fashion. A surgical pause was performed confirming correct patient, procedure, and operative location.  The left upper extremity was anesthetized with subcutaneous injection of 1% lidocaine  over the area of planned access. Using ultrasound guidance, the left upper extremity dialysis access was accessed with micropuncture technique.  Fistulogram was performed in stations with the micro sheath.  See above for details.  All endovascular  equipment was removed.  A figure-of-eight stitch was applied to the exit site with good hemostasis.  Sterile bandage was applied.  Upon completion of the case instrument and sharps counts were confirmed correct. The patient was transferred to the PACU in good condition. I was present for all portions of the procedure.  PLAN: AVF revision in OR on non-dialysis day in near future.   Debby SAILOR. Magda, MD [facility] Phone Number: [phone] 06/20/2024 8:11 AM

## 2024-06-21 ENCOUNTER — Telehealth: Payer: Self-pay

## 2024-06-21 ENCOUNTER — Encounter (HOSPITAL_COMMUNITY): Payer: Self-pay | Admitting: Vascular Surgery

## 2024-06-21 NOTE — Telephone Encounter (Signed)
 Attempted to call for surgery scheduling. LVM

## 2024-06-27 ENCOUNTER — Other Ambulatory Visit: Payer: Self-pay

## 2024-06-27 ENCOUNTER — Telehealth: Payer: Self-pay

## 2024-06-27 DIAGNOSIS — N186 End stage renal disease: Secondary | ICD-10-CM

## 2024-06-27 NOTE — Telephone Encounter (Signed)
 Patient asked for a return call today, 12/22, at 4p.  Attempted to call for surgery scheduling. LVM

## 2024-06-29 ENCOUNTER — Encounter (HOSPITAL_COMMUNITY): Payer: Self-pay

## 2024-07-06 ENCOUNTER — Encounter (HOSPITAL_COMMUNITY): Payer: Self-pay

## 2024-07-11 ENCOUNTER — Encounter (HOSPITAL_COMMUNITY): Payer: Self-pay | Admitting: Vascular Surgery

## 2024-07-11 ENCOUNTER — Other Ambulatory Visit: Payer: Self-pay

## 2024-07-11 NOTE — Progress Notes (Signed)
 SDW call  Patient was given pre-op  instructions over the phone. Patient verbalized understanding of instructions provided.  Denies SOB, fever or cough   PCP - Dr. Arthur Reese Nephrologist: Riccardo, dialysis T, TH, S Cardiologist -  Pulmonary:    PPM/ICD - denies Device Orders - na Rep Notified - na   Chest x-ray - 08/26/2023 EKG -  08/26/2023 Stress Test - ECHO - 08/26/2023 Cardiac Cath -   Sleep Study/sleep apnea/CPAP: denies  Type II diabetiec. A1C 4.6 08/29/2023.  Fasting Blood sugar range: 100-110 How often check sugars: daily Insulin  Lantus , use 19 units night before surgery which is 1/2 regular dose  GlP1: Ozempic, states last dose 07/04/2024   Blood Thinner Instructions: denies Aspirin Instructions:denies   ERAS Protcol - NPO   Anesthesia review: Yes. Dialysis, DM, HTN, ESRD  Your procedure is scheduled on Wednesday June 12, 2025  Report to [facility] Main Entrance A at  0800  A.M., then check in with the Admitting office.  Call this number if you have problems the morning of surgery:  [phone]   If you have any questions prior to your surgery date call [phone]: Open Monday-Friday 8am-4pm If you experience any cold or flu symptoms such as cough, fever, chills, shortness of breath, etc. between now and your scheduled surgery, please notify us  at the above number    Remember:  Do not eat or drink after midnight the night before your surgery  Take these medicines the morning of surgery with A SIP OF WATER:  Coreg   As needed: Tylenol , levsin   As of today, STOP taking any Aspirin (unless otherwise instructed by your surgeon) Aleve, Naproxen, Ibuprofen, Motrin, Advil, Goody's, BC's, all herbal medications, fish oil, and all vitamins.

## 2024-07-13 ENCOUNTER — Encounter (HOSPITAL_COMMUNITY): Payer: Self-pay | Admitting: Vascular Surgery

## 2024-07-13 ENCOUNTER — Encounter (HOSPITAL_COMMUNITY): Payer: Self-pay

## 2024-07-13 ENCOUNTER — Encounter (HOSPITAL_COMMUNITY): Admission: RE | Disposition: A | Payer: Self-pay | Source: Home / Self Care | Attending: Vascular Surgery

## 2024-07-13 ENCOUNTER — Other Ambulatory Visit: Payer: Self-pay

## 2024-07-13 ENCOUNTER — Ambulatory Visit (HOSPITAL_COMMUNITY): Admitting: Anesthesiology

## 2024-07-13 ENCOUNTER — Other Ambulatory Visit (HOSPITAL_COMMUNITY): Payer: Self-pay

## 2024-07-13 ENCOUNTER — Ambulatory Visit (HOSPITAL_COMMUNITY)
Admission: RE | Admit: 2024-07-13 | Discharge: 2024-07-13 | Disposition: A | Discharge status: Home / Self Care | Attending: Vascular Surgery | Admitting: Vascular Surgery

## 2024-07-13 ENCOUNTER — Ambulatory Visit (HOSPITAL_BASED_OUTPATIENT_CLINIC_OR_DEPARTMENT_OTHER): Payer: Self-pay

## 2024-07-13 ENCOUNTER — Encounter (HOSPITAL_COMMUNITY)
Admission: RE | Disposition: A | Discharge status: Home / Self Care | Payer: Self-pay | Source: Home / Self Care | Attending: Vascular Surgery

## 2024-07-13 DIAGNOSIS — Z992 Dependence on renal dialysis: Secondary | ICD-10-CM | POA: Insufficient documentation

## 2024-07-13 DIAGNOSIS — I97621 Postprocedural hematoma of a circulatory system organ or structure following other procedure: Secondary | ICD-10-CM | POA: Insufficient documentation

## 2024-07-13 DIAGNOSIS — N186 End stage renal disease: Secondary | ICD-10-CM | POA: Insufficient documentation

## 2024-07-13 DIAGNOSIS — Z6841 Body Mass Index (BMI) 40.0 and over, adult: Secondary | ICD-10-CM | POA: Insufficient documentation

## 2024-07-13 DIAGNOSIS — T82510A Breakdown (mechanical) of surgically created arteriovenous fistula, initial encounter: Secondary | ICD-10-CM | POA: Diagnosis not present

## 2024-07-13 DIAGNOSIS — K219 Gastro-esophageal reflux disease without esophagitis: Secondary | ICD-10-CM | POA: Diagnosis not present

## 2024-07-13 DIAGNOSIS — E6689 Other obesity not elsewhere classified: Secondary | ICD-10-CM | POA: Diagnosis not present

## 2024-07-13 DIAGNOSIS — E1122 Type 2 diabetes mellitus with diabetic chronic kidney disease: Secondary | ICD-10-CM | POA: Insufficient documentation

## 2024-07-13 DIAGNOSIS — I120 Hypertensive chronic kidney disease with stage 5 chronic kidney disease or end stage renal disease: Secondary | ICD-10-CM | POA: Insufficient documentation

## 2024-07-13 DIAGNOSIS — T82858A Stenosis of vascular prosthetic devices, implants and grafts, initial encounter: Secondary | ICD-10-CM

## 2024-07-13 DIAGNOSIS — I97638 Postprocedural hematoma of a circulatory system organ or structure following other circulatory system procedure: Secondary | ICD-10-CM

## 2024-07-13 DIAGNOSIS — D631 Anemia in chronic kidney disease: Secondary | ICD-10-CM | POA: Diagnosis not present

## 2024-07-13 DIAGNOSIS — Y712 Prosthetic and other implants, materials and accessory cardiovascular devices associated with adverse incidents: Secondary | ICD-10-CM | POA: Diagnosis not present

## 2024-07-13 DIAGNOSIS — Z794 Long term (current) use of insulin: Secondary | ICD-10-CM | POA: Diagnosis not present

## 2024-07-13 HISTORY — PX: REVISON OF ARTERIOVENOUS FISTULA: SHX6074

## 2024-07-13 HISTORY — PX: HEMATOMA EVACUATION: SHX5118

## 2024-07-13 LAB — POCT I-STAT, CHEM 8
BUN: 27 mg/dL — ABNORMAL HIGH (ref 8–23)
Calcium, Ion: 0.93 mmol/L — ABNORMAL LOW (ref 1.15–1.40)
Chloride: 97 mmol/L — ABNORMAL LOW (ref 98–111)
Creatinine, Ser: 5.6 mg/dL — ABNORMAL HIGH (ref 0.44–1.00)
Glucose, Bld: 126 mg/dL — ABNORMAL HIGH (ref 70–99)
HCT: 34 % — ABNORMAL LOW (ref 36.0–46.0)
Hemoglobin: 11.6 g/dL — ABNORMAL LOW (ref 12.0–15.0)
Potassium: 3.8 mmol/L (ref 3.5–5.1)
Sodium: 138 mmol/L (ref 135–145)
TCO2: 29 mmol/L (ref 22–32)

## 2024-07-13 LAB — GLUCOSE, CAPILLARY
Glucose-Capillary: 126 mg/dL — ABNORMAL HIGH (ref 70–99)
Glucose-Capillary: 98 mg/dL (ref 70–99)
Glucose-Capillary: 104 mg/dL — ABNORMAL HIGH (ref 70–99)
Glucose-Capillary: 143 mg/dL — ABNORMAL HIGH (ref 70–99)

## 2024-07-13 SURGERY — EVACUATION HEMATOMA
Anesthesia: General | Site: Arm Upper

## 2024-07-13 MED ORDER — MEPIVACAINE HCL (PF) 1.5 % IJ SOLN
INTRAMUSCULAR | Status: DC | PRN
  Administered 2024-07-13: 10 mL via PERINEURAL

## 2024-07-13 MED ORDER — PHENYLEPHRINE HCL-NACL 20-0.9 MG/250ML-% IV SOLN
INTRAVENOUS | Status: DC | PRN
  Administered 2024-07-13: 30 ug/min via INTRAVENOUS

## 2024-07-13 MED ORDER — SUCCINYLCHOLINE CHLORIDE 200 MG/10ML IV SOSY
PREFILLED_SYRINGE | INTRAVENOUS | Status: AC
  Filled 2024-07-13: qty 10

## 2024-07-13 MED ORDER — OXYCODONE HCL 5 MG/5ML PO SOLN: 5.0000 mg | Freq: Once | ORAL | Status: DC | PRN

## 2024-07-13 MED ORDER — CHLORHEXIDINE GLUCONATE 4 % EX SOLN: 60.0000 mL | Freq: Once | CUTANEOUS | Status: DC

## 2024-07-13 MED ORDER — INSULIN ASPART 100 UNIT/ML IJ SOLN: 0.0000 [IU] | INTRAMUSCULAR | Status: DC | PRN

## 2024-07-13 MED ORDER — SODIUM CHLORIDE 0.9 % IR SOLN
Status: DC | PRN
  Administered 2024-07-13: 1000 mL

## 2024-07-13 MED ORDER — OXYCODONE HCL 5 MG PO TABS: 5.0000 mg | ORAL_TABLET | Freq: Once | ORAL | Status: DC | PRN

## 2024-07-13 MED ORDER — PHENYLEPHRINE 80 MCG/ML (10ML) SYRINGE FOR IV PUSH (FOR BLOOD PRESSURE SUPPORT)
PREFILLED_SYRINGE | INTRAVENOUS | Status: DC | PRN
  Administered 2024-07-13: 80 ug via INTRAVENOUS
  Administered 2024-07-13: 120 ug via INTRAVENOUS

## 2024-07-13 MED ORDER — PROPOFOL 500 MG/50ML IV EMUL
INTRAVENOUS | Status: DC | PRN
  Administered 2024-07-13: 70 ug/kg/min via INTRAVENOUS

## 2024-07-13 MED ORDER — SODIUM CHLORIDE 0.9 % IV SOLN: INTRAVENOUS | Status: DC

## 2024-07-13 MED ORDER — MIDAZOLAM HCL (PF) 2 MG/2ML IJ SOLN
INTRAMUSCULAR | Status: DC | PRN
  Administered 2024-07-13: 2 mg via INTRAVENOUS

## 2024-07-13 MED ORDER — LIDOCAINE-EPINEPHRINE (PF) 1.5 %-1:200000 IJ SOLN
INTRAMUSCULAR | Status: DC | PRN
  Administered 2024-07-13: 15 mL via PERINEURAL

## 2024-07-13 MED ORDER — FENTANYL CITRATE (PF) 250 MCG/5ML IJ SOLN
INTRAMUSCULAR | Status: AC
  Filled 2024-07-13: qty 5

## 2024-07-13 MED ORDER — ROCURONIUM BROMIDE 10 MG/ML (PF) SYRINGE
PREFILLED_SYRINGE | INTRAVENOUS | Status: DC | PRN
  Administered 2024-07-13: 25 mg via INTRAVENOUS

## 2024-07-13 MED ORDER — DEXAMETHASONE SOD PHOSPHATE PF 10 MG/ML IJ SOLN
INTRAMUSCULAR | Status: AC
  Filled 2024-07-13: qty 1

## 2024-07-13 MED ORDER — MIDAZOLAM HCL 2 MG/2ML IJ SOLN
INTRAMUSCULAR | Status: AC
  Filled 2024-07-13: qty 2

## 2024-07-13 MED ORDER — ONDANSETRON HCL 4 MG/2ML IJ SOLN
INTRAMUSCULAR | Status: AC
  Filled 2024-07-13: qty 2

## 2024-07-13 MED ORDER — LIDOCAINE 2% (20 MG/ML) 5 ML SYRINGE
INTRAMUSCULAR | Status: AC
  Filled 2024-07-13: qty 5

## 2024-07-13 MED ORDER — MIDAZOLAM HCL (PF) 2 MG/2ML IJ SOLN
INTRAMUSCULAR | Status: DC | PRN
  Administered 2024-07-13 (×2): 1 mg via INTRAVENOUS

## 2024-07-13 MED ORDER — DEXAMETHASONE SOD PHOSPHATE PF 10 MG/ML IJ SOLN
INTRAMUSCULAR | Status: DC | PRN
  Administered 2024-07-13: 4 mg via INTRAVENOUS

## 2024-07-13 MED ORDER — SUCCINYLCHOLINE CHLORIDE 200 MG/10ML IV SOSY
PREFILLED_SYRINGE | INTRAVENOUS | Status: DC | PRN
  Administered 2024-07-13: 160 mg via INTRAVENOUS

## 2024-07-13 MED ORDER — OXYCODONE HCL 5 MG PO TABS
5.0000 mg | ORAL_TABLET | ORAL | 0 refills | Status: AC | PRN
  Filled 2024-07-13: qty 10, 2d supply, fill #0

## 2024-07-13 MED ORDER — ROCURONIUM BROMIDE 10 MG/ML (PF) SYRINGE
PREFILLED_SYRINGE | INTRAVENOUS | Status: AC
  Filled 2024-07-13: qty 10

## 2024-07-13 MED ORDER — EPHEDRINE SULFATE-NACL 50-0.9 MG/10ML-% IV SOSY
PREFILLED_SYRINGE | INTRAVENOUS | Status: DC | PRN
  Administered 2024-07-13: 15 mg via INTRAVENOUS
  Administered 2024-07-13: 10 mg via INTRAVENOUS

## 2024-07-13 MED ORDER — LIDOCAINE 2% (20 MG/ML) 5 ML SYRINGE
INTRAMUSCULAR | Status: DC | PRN
  Administered 2024-07-13: 60 mg via INTRAVENOUS

## 2024-07-13 MED ORDER — LIDOCAINE HCL (PF) 1 % IJ SOLN
INTRAMUSCULAR | Status: AC
  Filled 2024-07-13: qty 30

## 2024-07-13 MED ORDER — FENTANYL CITRATE (PF) 100 MCG/2ML IJ SOLN
INTRAMUSCULAR | Status: AC
  Administered 2024-07-13: 50 ug via INTRAVENOUS
  Filled 2024-07-13: qty 2

## 2024-07-13 MED ORDER — HEPARIN 6000 UNIT IRRIGATION SOLUTION
Status: DC | PRN
  Administered 2024-07-13: 1

## 2024-07-13 MED ORDER — FENTANYL CITRATE (PF) 100 MCG/2ML IJ SOLN
INTRAMUSCULAR | Status: AC
  Filled 2024-07-13: qty 2

## 2024-07-13 MED ORDER — MIDAZOLAM HCL (PF) 2 MG/2ML IJ SOLN: 1.0000 mg | Freq: Once | INTRAMUSCULAR | Status: AC

## 2024-07-13 MED ORDER — HEPARIN 6000 UNIT IRRIGATION SOLUTION
Status: AC
  Filled 2024-07-13: qty 500

## 2024-07-13 MED ORDER — ORAL CARE MOUTH RINSE: 15.0000 mL | Freq: Once | OROMUCOSAL | Status: DC

## 2024-07-13 MED ORDER — PHENYLEPHRINE 80 MCG/ML (10ML) SYRINGE FOR IV PUSH (FOR BLOOD PRESSURE SUPPORT)
PREFILLED_SYRINGE | INTRAVENOUS | Status: AC
  Filled 2024-07-13: qty 10

## 2024-07-13 MED ORDER — PHENYLEPHRINE HCL-NACL 20-0.9 MG/250ML-% IV SOLN
INTRAVENOUS | Status: DC | PRN
  Administered 2024-07-13: 35 ug/min via INTRAVENOUS

## 2024-07-13 MED ORDER — PROPOFOL 10 MG/ML IV BOLUS
INTRAVENOUS | Status: DC | PRN
  Administered 2024-07-13: 200 mg via INTRAVENOUS

## 2024-07-13 MED ORDER — CHLORHEXIDINE GLUCONATE 0.12 % MT SOLN
15.0000 mL | Freq: Once | OROMUCOSAL | Status: DC
  Filled 2024-07-13: qty 15

## 2024-07-13 MED ORDER — ONDANSETRON HCL 4 MG/2ML IJ SOLN
INTRAMUSCULAR | Status: DC | PRN
  Administered 2024-07-13: 4 mg via INTRAVENOUS

## 2024-07-13 MED ORDER — FENTANYL CITRATE (PF) 250 MCG/5ML IJ SOLN
INTRAMUSCULAR | Status: DC | PRN
  Administered 2024-07-13: 50 ug via INTRAVENOUS

## 2024-07-13 MED ORDER — PROPOFOL 10 MG/ML IV BOLUS
INTRAVENOUS | Status: AC
  Filled 2024-07-13: qty 20

## 2024-07-13 MED ORDER — PROTAMINE SULFATE 10 MG/ML IV SOLN
INTRAVENOUS | Status: DC | PRN
  Administered 2024-07-13: 20 mg via INTRAVENOUS

## 2024-07-13 MED ORDER — PHENYLEPHRINE 80 MCG/ML (10ML) SYRINGE FOR IV PUSH (FOR BLOOD PRESSURE SUPPORT)
PREFILLED_SYRINGE | INTRAVENOUS | Status: DC | PRN
  Administered 2024-07-13: 80 ug via INTRAVENOUS

## 2024-07-13 MED ORDER — ACETAMINOPHEN 10 MG/ML IV SOLN: 1000.0000 mg | Freq: Once | INTRAVENOUS | Status: DC | PRN

## 2024-07-13 MED ORDER — LIDOCAINE-EPINEPHRINE 1 %-1:100000 IJ SOLN
INTRAMUSCULAR | Status: AC
  Filled 2024-07-13: qty 1

## 2024-07-13 MED ORDER — MIDAZOLAM HCL 2 MG/2ML IJ SOLN
INTRAMUSCULAR | Status: AC
  Administered 2024-07-13: 1 mg via INTRAVENOUS
  Filled 2024-07-13: qty 2

## 2024-07-13 MED ORDER — FENTANYL CITRATE (PF) 100 MCG/2ML IJ SOLN: 50.0000 ug | Freq: Once | INTRAMUSCULAR | Status: AC

## 2024-07-13 MED ORDER — CEFAZOLIN SODIUM-DEXTROSE 3-4 GM/150ML-% IV SOLN
3.0000 g | INTRAVENOUS | Status: AC
  Administered 2024-07-13: 3 g via INTRAVENOUS
  Filled 2024-07-13: qty 150

## 2024-07-13 MED ORDER — 0.9 % SODIUM CHLORIDE (POUR BTL) OPTIME
TOPICAL | Status: DC | PRN
  Administered 2024-07-13: 1000 mL

## 2024-07-13 MED ORDER — FENTANYL CITRATE (PF) 100 MCG/2ML IJ SOLN
25.0000 ug | INTRAMUSCULAR | Status: DC | PRN
  Administered 2024-07-13 (×2): 50 ug via INTRAVENOUS

## 2024-07-13 MED ORDER — HEMOSTATIC AGENTS (NO CHARGE) OPTIME
TOPICAL | Status: DC | PRN
  Administered 2024-07-13: 2 via TOPICAL

## 2024-07-13 MED ORDER — SUGAMMADEX SODIUM 200 MG/2ML IV SOLN
INTRAVENOUS | Status: DC | PRN
  Administered 2024-07-13: 200 mg via INTRAVENOUS
  Administered 2024-07-13 (×2): 100 mg via INTRAVENOUS

## 2024-07-13 MED ORDER — EPHEDRINE 5 MG/ML INJ
INTRAVENOUS | Status: AC
  Filled 2024-07-13: qty 5

## 2024-07-13 MED ORDER — FENTANYL CITRATE (PF) 250 MCG/5ML IJ SOLN
INTRAMUSCULAR | Status: DC | PRN
  Administered 2024-07-13: 75 ug via INTRAVENOUS

## 2024-07-13 MED ORDER — PHENYLEPHRINE 80 MCG/ML (10ML) SYRINGE FOR IV PUSH (FOR BLOOD PRESSURE SUPPORT)
PREFILLED_SYRINGE | INTRAVENOUS | Status: AC
  Filled 2024-07-13: qty 20

## 2024-07-13 SURGICAL SUPPLY — 40 items
BAG COUNTER SPONGE SURGICOUNT (BAG) ×1 IMPLANT
BENZOIN TINCTURE PRP APPL 2/3 (GAUZE/BANDAGES/DRESSINGS) ×1 IMPLANT
BNDG COHESIVE 4X5 WHT NS (GAUZE/BANDAGES/DRESSINGS) IMPLANT
BNDG COMPR ESMARK 6X3 LF (GAUZE/BANDAGES/DRESSINGS) IMPLANT
BNDG ELASTIC 4INX 5YD STR LF (GAUZE/BANDAGES/DRESSINGS) IMPLANT
BNDG ELASTIC 6X10 VLCR STRL LF (GAUZE/BANDAGES/DRESSINGS) IMPLANT
BNDG GAUZE DERMACEA FLUFF 4 (GAUZE/BANDAGES/DRESSINGS) IMPLANT
CANISTER SUCTION 3000ML PPV (SUCTIONS) ×1 IMPLANT
CHLORAPREP W/TINT 26 (MISCELLANEOUS) ×1 IMPLANT
CUFF TOURN SGL QUICK 18X4 (TOURNIQUET CUFF) IMPLANT
CUFF TOURN SGL QUICK 42 (TOURNIQUET CUFF) IMPLANT
CUFF TRNQT CYL 24X4X16.5-23 (TOURNIQUET CUFF) IMPLANT
CUFF TRNQT CYL 34X4.125X (TOURNIQUET CUFF) IMPLANT
DRSG CURAD 3X16 NADH (PACKING) IMPLANT
ELECTRODE REM PT RTRN 9FT ADLT (ELECTROSURGICAL) ×1 IMPLANT
EVACUATOR SILICONE 100CC (DRAIN) IMPLANT
GAUZE SPONGE 4X4 12PLY STRL (GAUZE/BANDAGES/DRESSINGS) ×1 IMPLANT
GLOVE BIO SURGEON STRL SZ8 (GLOVE) ×1 IMPLANT
GOWN STRL REUS W/ TWL LRG LVL3 (GOWN DISPOSABLE) ×1 IMPLANT
GOWN STRL REUS W/ TWL XL LVL3 (GOWN DISPOSABLE) ×3 IMPLANT
HEMOSTAT SNOW SURGICEL 2X4 (HEMOSTASIS) IMPLANT
KIT BASIN OR (CUSTOM PROCEDURE TRAY) ×1 IMPLANT
KIT TURNOVER KIT B (KITS) ×1 IMPLANT
PACK CV ACCESS (CUSTOM PROCEDURE TRAY) IMPLANT
PACK GENERAL/GYN (CUSTOM PROCEDURE TRAY) IMPLANT
PACK PERIPHERAL VASCULAR (CUSTOM PROCEDURE TRAY) IMPLANT
PACK UNIVERSAL I (CUSTOM PROCEDURE TRAY) IMPLANT
PAD ARMBOARD POSITIONER FOAM (MISCELLANEOUS) ×2 IMPLANT
SOLN 0.9% NACL POUR BTL 1000ML (IV SOLUTION) ×2 IMPLANT
SOLN STERILE WATER BTL 1000 ML (IV SOLUTION) ×1 IMPLANT
STAPLER SKIN PROX 35W (STAPLE) IMPLANT
STRIP CLOSURE SKIN 1/2X4 (GAUZE/BANDAGES/DRESSINGS) ×1 IMPLANT
SUT MNCRL AB 4-0 PS2 18 (SUTURE) IMPLANT
SUT PROLENE 5 0 C 1 24 (SUTURE) IMPLANT
SUT PROLENE 6 0 BV (SUTURE) IMPLANT
SUT VIC AB 2-0 CT1 TAPERPNT 27 (SUTURE) IMPLANT
SUT VIC AB 3-0 SH 27X BRD (SUTURE) IMPLANT
TOWEL GREEN STERILE (TOWEL DISPOSABLE) ×1 IMPLANT
TRAY FOLEY MTR SLVR 16FR STAT (SET/KITS/TRAYS/PACK) IMPLANT
UNDERPAD 30X36 HEAVY ABSORB (UNDERPADS AND DIAPERS) ×1 IMPLANT

## 2024-07-13 NOTE — Discharge Instructions (Signed)
" ° °  [facility]  Discharge Instructions  AV Fistula or Graft Surgery for Dialysis Access  Please refer to the following instructions for your post-procedure care. Your surgeon or physician assistant will discuss any changes with you.  Activity  You may drive the day following your surgery, if you are comfortable and no longer taking prescription pain medication. Resume full activity as the soreness in your incision resolves.  Bathing/Showering  You may shower after you go home. Keep your incision dry for 48 hours.  Incision Care  Clean your incision with mild soap and water after 48 hours. Pat the area dry with a clean towel. You do not need a bandage unless otherwise instructed. Do not apply any ointments or creams to your incision. You may have skin glue on your incision. Do not peel it off. It will come off on its own in about one week. Your arm may swell a bit after surgery. To reduce swelling use pillows to elevate your arm so it is above your heart. Your doctor will tell you if you need to lightly wrap your arm with an ACE bandage.  Diet  Resume your normal diet. There are not special food restrictions following this procedure. In order to heal from your surgery, it is CRITICAL to get adequate nutrition. Your body requires vitamins, minerals, and protein. Vegetables are the best source of vitamins and minerals. Vegetables also provide the perfect balance of protein. Processed food has little nutritional value, so try to avoid this.  Medications  Resume taking all of your medications. If your incision is causing pain, you may take over-the counter pain relievers such as acetaminophen  (Tylenol ). If you were prescribed a stronger pain medication, please be aware these medications can cause nausea and constipation. Prevent nausea by taking the medication with a snack or meal. Avoid constipation by drinking plenty of fluids and eating foods with high amount  of fiber, such as fruits, vegetables, and grains.  Do not take Tylenol  if you are taking prescription pain medications.  Follow up Your surgeon may want to see you in the office following your access surgery. If so, this will be arranged at the time of your surgery.  Please call us  immediately for any of the following conditions:  Increased pain, redness, drainage (pus) from your incision site Fever of 101 degrees or higher Severe or worsening pain at your incision site Hand pain or numbness.  Reduce your risk of vascular disease:  Stop smoking. If you would like help, call [facility] at 1-800-QUIT-NOW ((251) 663-7782) or [facility] at (801)058-3278  Manage your cholesterol Maintain a desired weight Control your diabetes Keep your blood pressure down  Dialysis  Your fistula can be accessed immediately after surgery   07/13/2024 Erin Davila [id number] 11/08/1960  Surgeon(s): Magda Debby SAILOR, MD  Procedures: REVISON OF ARTERIOVENOUS FISTULA  x May stick fistula immediately   May stick graft on designated area only:    If you have any questions, please call the office at [phone].  "

## 2024-07-13 NOTE — Progress Notes (Signed)
 Called to PACU to evaluate patient.  Development of fairly large hematoma in the surgical wound with absent AV fistula thrill.  Plan return to the operating room for evacuation of hematoma and evaluation of the anastomosis.  Debby SAILOR. Magda, MD [facility] Phone Number: [phone] 07/13/2024 4:17 PM

## 2024-07-13 NOTE — Interval H&P Note (Signed)
 History and Physical Interval Note:  07/13/2024 11:38 AM  Erin Davila  has presented today for surgery, with the diagnosis of ESRD.  The various methods of treatment have been discussed with the patient and family. After consideration of risks, benefits and other options for treatment, the patient has consented to  Procedures: REVISON OF ARTERIOVENOUS FISTULA (Left) as a surgical intervention.  The patient's history has been reviewed, patient examined, no change in status, stable for surgery.  I have reviewed the patient's chart and labs.  Questions were answered to the patient's satisfaction.     Debby LOISE Robertson

## 2024-07-13 NOTE — Transfer of Care (Signed)
 Immediate Anesthesia Transfer of Care Note  Patient: Erin Davila  Procedure(s) Performed: EVACUATION HEMATOMA (Arm Upper)  Patient Location: PACU  Anesthesia Type:General  Level of Consciousness: awake and alert   Airway & Oxygen Therapy: Patient Spontanous Breathing and Patient connected to face mask oxygen  Post-op Assessment: Report given to RN  Post vital signs: Reviewed and unstable  Last Vitals:  Vitals Value Taken Time  BP 166/61 07/13/24 17:53  Temp 36.9 C 07/13/24 17:53  Pulse 82 07/13/24 17:57  Resp 14 07/13/24 17:57  SpO2 93 % 07/13/24 17:57  Vitals shown include unfiled device data.  Last Pain:  Vitals:   07/13/24 1500  TempSrc:   PainSc: 10-Worst pain ever         Complications: No notable events documented.

## 2024-07-13 NOTE — Anesthesia Procedure Notes (Signed)
 Anesthesia Regional Block: Supraclavicular block   Pre-Anesthetic Checklist: , timeout performed,  Correct Patient, Correct Site, Correct Laterality,  Correct Procedure, Correct Position, site marked,  Risks and benefits discussed,  Surgical consent,  Pre-op  evaluation,  At surgeon's request and post-op pain management  Laterality: Left and Upper  Prep: chloraprep       Needles:  Injection technique: Single-shot      Needle Length: 5cm  Needle Gauge: 22     Additional Needles: Arrow StimuQuik ECHO Echogenic Stimulating PNB Needle  Procedures:,,,, ultrasound used (permanent image in chart),,    Narrative:  Start time: 07/13/2024 11:08 AM End time: 07/13/2024 11:12 AM Injection made incrementally with aspirations every 5 mL.  Performed by: Personally  Anesthesiologist: Leopoldo Bruckner, MD

## 2024-07-13 NOTE — Anesthesia Procedure Notes (Signed)
 Procedure Name: Intubation Date/Time: 07/13/2024 4:54 PM  Performed by: Roslynn Waddell LABOR, CRNAPre-anesthesia Checklist: Patient identified, Emergency Drugs available, Suction available and Patient being monitored Patient Re-evaluated:Patient Re-evaluated prior to induction Oxygen Delivery Method: Circle System Utilized Preoxygenation: Pre-oxygenation with 100% oxygen Induction Type: IV induction Laryngoscope Size: Glidescope and 3 Grade View: Grade I Tube type: Oral Tube size: 7.0 mm Number of attempts: 1 Airway Equipment and Method: Stylet and Oral airway Placement Confirmation: ETT inserted through vocal cords under direct vision, positive ETCO2 and breath sounds checked- equal and bilateral Secured at: 21 cm Tube secured with: Tape Dental Injury: Teeth and Oropharynx as per pre-operative assessment  Comments: Atraumatic induction/intubation. Dentition and oral mucosa as per preop.

## 2024-07-13 NOTE — Anesthesia Preprocedure Evaluation (Signed)
 "                                  Anesthesia Evaluation  Patient identified by MRN, date of birth, ID band Patient awake    Reviewed: Allergy & Precautions, NPO status , Patient's Chart, lab work & pertinent test results  History of Anesthesia Complications Negative for: history of anesthetic complications  Airway Mallampati: III  TM Distance: >3 FB Neck ROM: Full    Dental  (+) Teeth Intact, Dental Advisory Given   Pulmonary neg shortness of breath, neg sleep apnea, neg COPD, neg recent URI   breath sounds clear to auscultation       Cardiovascular hypertension, Pt. on medications and Pt. on home beta blockers (-) angina (-) Past MI and (-) CHF  Rhythm:Regular  1. Left ventricular ejection fraction, by estimation, is 60 to 65%. The  left ventricle has normal function. Left ventricular endocardial border  not optimally defined to evaluate regional wall motion. The left  ventricular internal cavity size was  moderately to severely dilated. There is mild left ventricular  hypertrophy. Left ventricular diastolic parameters are indeterminate.   2. Right ventricular systolic function is mildly reduced. The right  ventricular size is mildly enlarged. There is normal pulmonary artery  systolic pressure. The estimated right ventricular systolic pressure is  34.6 mmHg.   3. The mitral valve is degenerative. Trivial mitral valve regurgitation.  No evidence of mitral stenosis.   4. The aortic valve is tricuspid. There is mild thickening of the aortic  valve. Aortic valve regurgitation is trivial. Aortic valve sclerosis is  present, with no evidence of aortic valve stenosis.   5. The inferior vena cava is dilated in size with >50% respiratory  variability, suggesting right atrial pressure of 8 mmHg.   6. Increased flow velocities may be secondary to anemia, thyrotoxicosis,  hyperdynamic or high flow state, suspect high output state given elevated  mitral and aortic valve  velocities.      Neuro/Psych negative neurological ROS  negative psych ROS   GI/Hepatic ,GERD  Controlled,,  Endo/Other  diabetes, Type 2, Insulin  Dependent  Class 4 obesityLab Results      Component                Value               Date                      HGBA1C                   4.6 (L)             08/29/2023             Renal/GU ESRF and DialysisRenal diseaseLab Results      Component                Value               Date                      NA                       138                 07/13/2024  K                        3.8                 07/13/2024                CO2                      25                  09/04/2023                GLUCOSE                  126 (H)             07/13/2024                BUN                      27 (H)              07/13/2024                CREATININE               5.60 (H)            07/13/2024                CALCIUM                   8.3 (L)             09/04/2023                GFRNONAA                 7 (L)               09/04/2023            Last HD yesterday (no issues)     Musculoskeletal negative musculoskeletal ROS (+)    Abdominal   Peds  Hematology  (+) Blood dyscrasia, anemia L   Anesthesia Other Findings Post op hematoma left arm, npo  Reproductive/Obstetrics                              Anesthesia Physical Anesthesia Plan  ASA: 4  Anesthesia Plan: General   Post-op Pain Management:    Induction: Intravenous  PONV Risk Score and Plan: 4 or greater and Ondansetron   Airway Management Planned: Oral ETT  Additional Equipment: None  Intra-op Plan:   Post-operative Plan: Extubation in OR  Informed Consent: I have reviewed the patients History and Physical, chart, labs and discussed the procedure including the risks, benefits and alternatives for the proposed anesthesia with the patient or authorized representative who has indicated his/her understanding and  acceptance.     Dental advisory given  Plan Discussed with: CRNA  Anesthesia Plan Comments:          Anesthesia Quick Evaluation  "

## 2024-07-13 NOTE — Op Note (Signed)
 DATE OF SERVICE: 07/13/2024  PATIENT:  Erin Davila  64 y.o. female  PRE-OPERATIVE DIAGNOSIS: End-stage renal disease, malfunctioning left arm AV fistula  POST-OPERATIVE DIAGNOSIS:  Same  PROCEDURE:   Revision of left arm AV fistula with transposition to brachial artery  SURGEON:  Surgeons and Role:    * Magda Debby SAILOR, MD - Primary  ASSISTANT: Ahmed Holster, PA-C  An experienced assistant was required given the complexity of this procedure and the standard of surgical care. My assistant helped with exposure through counter tension, suctioning, ligation and retraction to better visualize the surgical field.  My assistant expedited sewing during the case by following my sutures. Wherever I use the term we in the report, my assistant actively helped me with that portion of the procedure.  ANESTHESIA:   general  EBL: min  BLOOD ADMINISTERED:none  DRAINS: none   LOCAL MEDICATIONS USED:  NONE  SPECIMEN:  none  COUNTS: confirmed correct.  TOURNIQUET:  none  PATIENT DISPOSITION:  PACU - hemodynamically stable.   Delay start of Pharmacological VTE agent (>24hrs) due to surgical blood loss or risk of bleeding: no  INDICATION FOR PROCEDURE: Lynleigh G Potts is a 63 y.o. female with ESRD on HD via LUE AVF. The fistula has been performing poorly. There is significant proximal stenosis. After careful discussion of risks, benefits, and alternatives the patient was offered AVF revision. The patient understood and wished to proceed.  OPERATIVE FINDINGS: successful transposition to more proximal brachial artery.  DESCRIPTION OF PROCEDURE: After identification of the patient in the pre-operative holding area, the patient was transferred to the operating room. The patient was positioned supine on the operating room table. Anesthesia was induced. The left arm was prepped and draped in standard fashion. A surgical pause was performed confirming correct patient, procedure, and operative  location.  Intraoperative ultrasound was used to map the course of the fistula in the native brachial artery.  A transverse incision was made in the distal upper arm to anastomose the 2.  The fistula was skeletonized.  The peripheral aspect was doubly ligated with 2 layer closure of 5-0 Prolene.  The brachial artery was isolated for several centimeters.  The cephalic vein was transposed on the brachial artery.  The brachial artery was clamped and an anterior arteriotomy made with an 11 blade and Potts scissors.  The cut end of the fistula was then anastomosed to the brachial artery using continuous running suture 6-0 Prolene.  Prior completion the anastomosis was flushed and de-aired.  Clamps were released.  Flow was evaluated with Doppler machine.  Good flow was heard in the fistula as well as the radial artery.  Hemostasis was confirmed in surgical bed.  The wounds were closed in layers using 3-0 Vicryl and 4-0 Monocryl.  Upon completion of the case instrument and sharps counts were confirmed correct. The patient was transferred to the PACU in good condition. I was present for all portions of the procedure.  Debby SAILOR. Magda, MD [facility] Phone Number: [phone] 07/13/2024 4:14 PM

## 2024-07-13 NOTE — Anesthesia Postprocedure Evaluation (Signed)
"   Anesthesia Post Note  Patient: Lavonne G Abee  Procedure(s) Performed: REVISON OF ARTERIOVENOUS FISTULA (Left)     Patient location during evaluation: PACU Anesthesia Type: Regional and MAC Level of consciousness: awake and alert Pain management: pain level controlled Vital Signs Assessment: post-procedure vital signs reviewed and stable Respiratory status: spontaneous breathing, nonlabored ventilation and respiratory function stable Cardiovascular status: stable and blood pressure returned to baseline Postop Assessment: no apparent nausea or vomiting Anesthetic complications: no   No notable events documented.  Last Vitals:  Vitals:   07/13/24 1500 07/13/24 1515  BP: (!) 141/74 111/68  Pulse: 76 76  Resp: 14 12  Temp: 36.7 C   SpO2: 97% 95%    Last Pain:  Vitals:   07/13/24 1500  TempSrc:   PainSc: 10-Worst pain ever                 Tamarcus Condie      "

## 2024-07-13 NOTE — Anesthesia Preprocedure Evaluation (Addendum)
 "                                  Anesthesia Evaluation  Patient identified by MRN, date of birth, ID band Patient awake    Reviewed: Allergy & Precautions, NPO status , Patient's Chart, lab work & pertinent test results  History of Anesthesia Complications Negative for: history of anesthetic complications  Airway Mallampati: III  TM Distance: >3 FB Neck ROM: Full    Dental  (+) Teeth Intact, Dental Advisory Given   Pulmonary neg shortness of breath, neg sleep apnea, neg COPD, neg recent URI   breath sounds clear to auscultation       Cardiovascular hypertension, Pt. on medications and Pt. on home beta blockers (-) angina (-) Past MI and (-) CHF  Rhythm:Regular  1. Left ventricular ejection fraction, by estimation, is 60 to 65%. The  left ventricle has normal function. Left ventricular endocardial border  not optimally defined to evaluate regional wall motion. The left  ventricular internal cavity size was  moderately to severely dilated. There is mild left ventricular  hypertrophy. Left ventricular diastolic parameters are indeterminate.   2. Right ventricular systolic function is mildly reduced. The right  ventricular size is mildly enlarged. There is normal pulmonary artery  systolic pressure. The estimated right ventricular systolic pressure is  34.6 mmHg.   3. The mitral valve is degenerative. Trivial mitral valve regurgitation.  No evidence of mitral stenosis.   4. The aortic valve is tricuspid. There is mild thickening of the aortic  valve. Aortic valve regurgitation is trivial. Aortic valve sclerosis is  present, with no evidence of aortic valve stenosis.   5. The inferior vena cava is dilated in size with >50% respiratory  variability, suggesting right atrial pressure of 8 mmHg.   6. Increased flow velocities may be secondary to anemia, thyrotoxicosis,  hyperdynamic or high flow state, suspect high output state given elevated  mitral and aortic valve  velocities.      Neuro/Psych negative neurological ROS  negative psych ROS   GI/Hepatic ,GERD  Controlled,,  Endo/Other  diabetes, Type 2, Insulin  Dependent  Class 4 obesityLab Results      Component                Value               Date                      HGBA1C                   4.6 (L)             08/29/2023             Renal/GU ESRF and DialysisRenal diseaseLab Results      Component                Value               Date                      NA                       138                 07/13/2024  K                        3.8                 07/13/2024                CO2                      25                  09/04/2023                GLUCOSE                  126 (H)             07/13/2024                BUN                      27 (H)              07/13/2024                CREATININE               5.60 (H)            07/13/2024                CALCIUM                   8.3 (L)             09/04/2023                GFRNONAA                 7 (L)               09/04/2023            Last HD yesterday (no issues)     Musculoskeletal negative musculoskeletal ROS (+)    Abdominal   Peds  Hematology  (+) Blood dyscrasia, anemia Lab Results      Component                Value               Date                      WBC                      6.0                 09/03/2023                HGB                      11.6 (L)            07/13/2024                HCT                      34.0 (L)            07/13/2024                MCV  104.3 (H)           09/03/2023                PLT                      234                 09/03/2023              Anesthesia Other Findings   Reproductive/Obstetrics                              Anesthesia Physical Anesthesia Plan  ASA: 4  Anesthesia Plan: MAC and Regional   Post-op Pain Management: Ofirmev  IV (intra-op)*   Induction: Intravenous  PONV Risk Score and  Plan: 4 or greater and Ondansetron  and Dexamethasone   Airway Management Planned: Nasal Cannula, Natural Airway and Simple Face Mask  Additional Equipment: None  Intra-op Plan:   Post-operative Plan: Extubation in OR  Informed Consent: I have reviewed the patients History and Physical, chart, labs and discussed the procedure including the risks, benefits and alternatives for the proposed anesthesia with the patient or authorized representative who has indicated his/her understanding and acceptance.     Dental advisory given  Plan Discussed with: CRNA  Anesthesia Plan Comments:          Anesthesia Quick Evaluation  "

## 2024-07-13 NOTE — Op Note (Signed)
 DATE OF SERVICE: 07/13/2024  PATIENT:  Erin Davila  64 y.o. female  PRE-OPERATIVE DIAGNOSIS:  postop hematoma  POST-OPERATIVE DIAGNOSIS:  Same  PROCEDURE:   Evacuation of left arm hematoma  SURGEON:  Surgeons and Role:    * Magda Debby SAILOR, MD - Primary  ASSISTANT: Ahmed Holster, PA-C  An experienced assistant was required given the complexity of this procedure and the standard of surgical care. My assistant helped with exposure through counter tension, suctioning, ligation and retraction to better visualize the surgical field.  My assistant expedited sewing during the case by following my sutures. Wherever I use the term we in the report, my assistant actively helped me with that portion of the procedure.  ANESTHESIA:   general  EBL: min  BLOOD ADMINISTERED:none  DRAINS: none   LOCAL MEDICATIONS USED:  NONE  SPECIMEN:  none  COUNTS: confirmed correct.  TOURNIQUET:  none  PATIENT DISPOSITION:  PACU - hemodynamically stable.   Delay start of Pharmacological VTE agent (>24hrs) due to surgical blood loss or risk of bleeding: no  INDICATION FOR PROCEDURE: Erin Davila is a 64 y.o. female with ESRD on HD via LUE AVF. The fistula was revised earlier today and prior to discharge she had a softball sized hematoma develop. After careful discussion of risks, benefits, and alternatives the patient was offered hematoma evacuation. The patient understood and wished to proceed.  OPERATIVE FINDINGS: no clear source for hematoma found. Reinforced transected edge with clips. Anastomosis provoked and I could not get it to bleed. Packed with surgicel. Arm wrapped.  DESCRIPTION OF PROCEDURE: After identification of the patient in the pre-operative holding area, the patient was transferred to the operating room. The patient was positioned supine on the operating room table. Anesthesia was induced. The left arm was prepped and draped in standard fashion. A surgical pause was performed  confirming correct patient, procedure, and operative location.  The incision was reopened sharply. The hematoma was evacuated. The wound was irrigated. No clear bleeding source was identified. Fresh blood appeared around the cut ligated end of the fistula and so this was doubly clipped. The anastomosis was provoked and did not bleed. The wound was packed with surgicel snow. The wound was closed in layers using 3-O vicryla nd 4-O monocryl. The arm was wrapped.  Upon completion of the case instrument and sharps counts were confirmed correct. The patient was transferred to the PACU in good condition. I was present for all portions of the procedure.  Debby SAILOR. Magda, MD [facility] Phone Number: [phone] 07/13/2024 5:43 PM

## 2024-07-13 NOTE — Transfer of Care (Signed)
 Immediate Anesthesia Transfer of Care Note  Patient: Erin Davila  Procedure(s) Performed: REVISON OF ARTERIOVENOUS FISTULA (Left)  Patient Location: PACU  Anesthesia Type:MAC and Regional  Level of Consciousness: awake  Airway & Oxygen Therapy: Patient Spontanous Breathing  Post-op Assessment: Report given to RN and Post -op Vital signs reviewed and stable  Post vital signs: Reviewed and stable  Last Vitals:  Vitals Value Taken Time  BP 112/75 07/13/24 13:50  Temp    Pulse    Resp 18 07/13/24 13:53  SpO2    Vitals shown include unfiled device data.  Last Pain:  Vitals:   07/13/24 0927  TempSrc:   PainSc: 0-No pain         Complications: No notable events documented.

## 2024-07-14 ENCOUNTER — Encounter (HOSPITAL_COMMUNITY): Payer: Self-pay | Admitting: Vascular Surgery

## 2024-07-15 NOTE — Anesthesia Postprocedure Evaluation (Signed)
"   Anesthesia Post Note  Patient: Erin Davila  Procedure(s) Performed: EVACUATION HEMATOMA (Arm Upper)     Patient location during evaluation: PACU Anesthesia Type: General Level of consciousness: awake and patient cooperative Pain management: pain level controlled Vital Signs Assessment: post-procedure vital signs reviewed and stable Respiratory status: spontaneous breathing, respiratory function stable and nonlabored ventilation Cardiovascular status: stable Postop Assessment: no apparent nausea or vomiting Anesthetic complications: no   No notable events documented.                Jheremy Boger      "

## 2024-08-16 ENCOUNTER — Ambulatory Visit

## 2024-08-17 ENCOUNTER — Encounter
